# Patient Record
Sex: Female | Born: 1960
Health system: Southern US, Community
[De-identification: ages and names within clinical notes are randomized; demographics above are authoritative.]

## PROBLEM LIST (undated history)

## (undated) DIAGNOSIS — S6720XA Crushing injury of unspecified hand, initial encounter: Secondary | ICD-10-CM

## (undated) DIAGNOSIS — T8859XA Other complications of anesthesia, initial encounter: Secondary | ICD-10-CM

## (undated) DIAGNOSIS — F32A Depression, unspecified: Secondary | ICD-10-CM

## (undated) DIAGNOSIS — K5190 Ulcerative colitis, unspecified, without complications: Secondary | ICD-10-CM

## (undated) DIAGNOSIS — Z22322 Carrier or suspected carrier of Methicillin resistant Staphylococcus aureus: Secondary | ICD-10-CM

## (undated) DIAGNOSIS — K219 Gastro-esophageal reflux disease without esophagitis: Secondary | ICD-10-CM

## (undated) DIAGNOSIS — F1910 Other psychoactive substance abuse, uncomplicated: Secondary | ICD-10-CM

## (undated) DIAGNOSIS — K602 Anal fissure, unspecified: Secondary | ICD-10-CM

## (undated) DIAGNOSIS — M1990 Unspecified osteoarthritis, unspecified site: Secondary | ICD-10-CM

## (undated) DIAGNOSIS — I700 Atherosclerosis of aorta: Secondary | ICD-10-CM

## (undated) DIAGNOSIS — G8921 Chronic pain due to trauma: Secondary | ICD-10-CM

## (undated) DIAGNOSIS — I10 Essential (primary) hypertension: Secondary | ICD-10-CM

## (undated) DIAGNOSIS — K649 Unspecified hemorrhoids: Secondary | ICD-10-CM

## (undated) DIAGNOSIS — G629 Polyneuropathy, unspecified: Secondary | ICD-10-CM

## (undated) DIAGNOSIS — R29898 Other symptoms and signs involving the musculoskeletal system: Secondary | ICD-10-CM

## (undated) DIAGNOSIS — C4490 Unspecified malignant neoplasm of skin, unspecified: Secondary | ICD-10-CM

## (undated) DIAGNOSIS — G5620 Lesion of ulnar nerve, unspecified upper limb: Secondary | ICD-10-CM

## (undated) DIAGNOSIS — F4310 Post-traumatic stress disorder, unspecified: Secondary | ICD-10-CM

## (undated) DIAGNOSIS — G709 Myoneural disorder, unspecified: Secondary | ICD-10-CM

## (undated) DIAGNOSIS — Z87442 Personal history of urinary calculi: Secondary | ICD-10-CM

## (undated) DIAGNOSIS — W57XXXA Bitten or stung by nonvenomous insect and other nonvenomous arthropods, initial encounter: Secondary | ICD-10-CM

## (undated) DIAGNOSIS — F1010 Alcohol abuse, uncomplicated: Secondary | ICD-10-CM

## (undated) DIAGNOSIS — Z5189 Encounter for other specified aftercare: Secondary | ICD-10-CM

## (undated) DIAGNOSIS — T4145XA Adverse effect of unspecified anesthetic, initial encounter: Secondary | ICD-10-CM

## (undated) DIAGNOSIS — E785 Hyperlipidemia, unspecified: Secondary | ICD-10-CM

## (undated) DIAGNOSIS — F419 Anxiety disorder, unspecified: Secondary | ICD-10-CM

## (undated) DIAGNOSIS — I639 Cerebral infarction, unspecified: Secondary | ICD-10-CM

## (undated) DIAGNOSIS — F329 Major depressive disorder, single episode, unspecified: Secondary | ICD-10-CM

## (undated) DIAGNOSIS — J449 Chronic obstructive pulmonary disease, unspecified: Secondary | ICD-10-CM

## (undated) HISTORY — PX: COLON SURGERY: SHX602

## (undated) HISTORY — PX: COSMETIC SURGERY: SHX468

## (undated) HISTORY — DX: Post-traumatic stress disorder, unspecified: F43.10

## (undated) HISTORY — DX: Hyperlipidemia, unspecified: E78.5

## (undated) HISTORY — PX: TUBAL LIGATION: SHX77

## (undated) HISTORY — DX: Anxiety disorder, unspecified: F41.9

## (undated) HISTORY — DX: Polyneuropathy, unspecified: G62.9

## (undated) HISTORY — DX: Bitten or stung by nonvenomous insect and other nonvenomous arthropods, initial encounter: W57.XXXA

## (undated) HISTORY — DX: Anal fissure, unspecified: K60.2

## (undated) HISTORY — DX: Encounter for other specified aftercare: Z51.89

## (undated) HISTORY — DX: Myoneural disorder, unspecified: G70.9

## (undated) HISTORY — DX: Carrier or suspected carrier of methicillin resistant Staphylococcus aureus: Z22.322

## (undated) HISTORY — DX: Unspecified hemorrhoids: K64.9

## (undated) HISTORY — DX: Major depressive disorder, single episode, unspecified: F32.9

## (undated) HISTORY — DX: Depression, unspecified: F32.A

## (undated) HISTORY — PX: INCISION / DRAINAGE HAND / FINGER: SUR695

## (undated) HISTORY — DX: Unspecified osteoarthritis, unspecified site: M19.90

## (undated) HISTORY — DX: Other psychoactive substance abuse, uncomplicated: F19.10

## (undated) HISTORY — DX: Atherosclerosis of aorta: I70.0

---

## 2004-02-27 ENCOUNTER — Ambulatory Visit (HOSPITAL_COMMUNITY): Admission: RE | Admit: 2004-02-27 | Discharge: 2004-02-27 | Payer: Self-pay | Admitting: Internal Medicine

## 2004-06-11 ENCOUNTER — Ambulatory Visit: Payer: Self-pay | Admitting: Family Medicine

## 2005-08-30 ENCOUNTER — Inpatient Hospital Stay (HOSPITAL_COMMUNITY): Admission: EM | Admit: 2005-08-30 | Discharge: 2005-09-07 | Payer: Self-pay | Admitting: Emergency Medicine

## 2005-08-31 HISTORY — PX: ORIF CARPAL BONE FRACTURE: SUR922

## 2005-09-04 HISTORY — PX: ORIF RADIAL FRACTURE: SHX5113

## 2005-09-12 HISTORY — PX: OTHER SURGICAL HISTORY: SHX169

## 2005-12-24 ENCOUNTER — Inpatient Hospital Stay (HOSPITAL_COMMUNITY): Admission: AD | Admit: 2005-12-24 | Discharge: 2005-12-27 | Payer: Self-pay | Admitting: Orthopedic Surgery

## 2005-12-24 HISTORY — PX: HAND CARPECTOMY: SHX972

## 2006-05-11 ENCOUNTER — Inpatient Hospital Stay (HOSPITAL_COMMUNITY): Admission: RE | Admit: 2006-05-11 | Discharge: 2006-05-16 | Payer: Self-pay | Admitting: Orthopedic Surgery

## 2006-05-11 HISTORY — PX: OTHER SURGICAL HISTORY: SHX169

## 2008-01-19 ENCOUNTER — Emergency Department (HOSPITAL_COMMUNITY): Admission: EM | Admit: 2008-01-19 | Discharge: 2008-01-19 | Payer: Self-pay | Admitting: Emergency Medicine

## 2008-10-13 ENCOUNTER — Encounter: Admission: RE | Admit: 2008-10-13 | Discharge: 2008-10-13 | Payer: Self-pay | Admitting: Family Medicine

## 2009-01-13 ENCOUNTER — Emergency Department (HOSPITAL_COMMUNITY): Admission: EM | Admit: 2009-01-13 | Discharge: 2009-01-13 | Payer: Self-pay | Admitting: Emergency Medicine

## 2009-01-13 ENCOUNTER — Ambulatory Visit: Payer: Self-pay | Admitting: Psychiatry

## 2009-01-13 ENCOUNTER — Observation Stay (HOSPITAL_COMMUNITY): Admission: AD | Admit: 2009-01-13 | Discharge: 2009-01-14 | Payer: Self-pay | Admitting: Psychiatry

## 2009-10-21 ENCOUNTER — Ambulatory Visit (HOSPITAL_COMMUNITY): Admission: RE | Admit: 2009-10-21 | Discharge: 2009-10-21 | Payer: Self-pay | Admitting: Family Medicine

## 2010-12-21 LAB — RAPID URINE DRUG SCREEN, HOSP PERFORMED
Amphetamines: NOT DETECTED
Barbiturates: NOT DETECTED
Benzodiazepines: NOT DETECTED
Cocaine: NOT DETECTED
Opiates: NOT DETECTED
Tetrahydrocannabinol: POSITIVE — AB

## 2010-12-21 LAB — COMPREHENSIVE METABOLIC PANEL
ALT: 26 U/L (ref 0–35)
AST: 31 U/L (ref 0–37)
Albumin: 4.1 g/dL (ref 3.5–5.2)
Alkaline Phosphatase: 46 U/L (ref 39–117)
BUN: 9 mg/dL (ref 6–23)
CO2: 27 mEq/L (ref 19–32)
Calcium: 9.5 mg/dL (ref 8.4–10.5)
Chloride: 109 mEq/L (ref 96–112)
Creatinine, Ser: 0.72 mg/dL (ref 0.4–1.2)
GFR calc Af Amer: >60 mL/min (ref >60)
GFR calc non Af Amer: >60 mL/min (ref >60)
Glucose, Bld: 82 mg/dL (ref 70–99)
Potassium: 3.9 mEq/L (ref 3.5–5.1)
Sodium: 141 mEq/L (ref 135–145)
Total Bilirubin: 0.5 mg/dL (ref 0.3–1.2)
Total Protein: 6.4 g/dL (ref 6.0–8.3)

## 2010-12-21 LAB — DIFFERENTIAL
Basophils Absolute: 0 10*3/uL (ref 0.0–0.1)
Basophils Relative: 0 % (ref 0–1)
Eosinophils Absolute: 0.1 10*3/uL (ref 0.0–0.7)
Eosinophils Relative: 1 % (ref 0–5)
Lymphocytes Relative: 30 % (ref 12–46)
Lymphs Abs: 2.3 10*3/uL (ref 0.7–4.0)
Monocytes Absolute: 0.5 10*3/uL (ref 0.1–1.0)
Monocytes Relative: 7 % (ref 3–12)
Neutro Abs: 4.9 10*3/uL (ref 1.7–7.7)
Neutrophils Relative %: 62 % (ref 43–77)

## 2010-12-21 LAB — CBC
HCT: 41 % (ref 36.0–46.0)
Hemoglobin: 13.8 g/dL (ref 12.0–15.0)
MCHC: 33.7 g/dL (ref 30.0–36.0)
MCV: 95.8 fL (ref 78.0–100.0)
Platelets: 250 10*3/uL (ref 150–400)
RBC: 4.28 MIL/uL (ref 3.87–5.11)
RDW: 12.7 % (ref 11.5–15.5)
WBC: 7.8 10*3/uL (ref 4.0–10.5)

## 2010-12-21 LAB — POCT PREGNANCY, URINE: Preg Test, Ur: NEGATIVE

## 2010-12-21 LAB — ETHANOL: Alcohol, Ethyl (B): 65 mg/dL — ABNORMAL HIGH (ref 0–10)

## 2011-01-28 NOTE — Op Note (Signed)
[name], [name]             ACCOUNT NO.:  [id number]   MEDICAL RECORD NO.:  [id number]          PATIENT TYPE:  INP   LOCATION:  5018                         FACILITY:  [facility]   PHYSICIAN:  Lind Guest. Ninfa Linden, M.D.DATE OF BIRTH:  1961/06/08   DATE OF PROCEDURE:  09/04/2005  DATE OF DISCHARGE:                                 OPERATIVE REPORT   PREOPERATIVE DIAGNOSES:  1.  Right hand and wrist crush injury status post second ray amputation,      status post pinning of unstable thumb metacarpal, status post pinning of      unstable wrist carpus. risk carpus.  2.  Pinning of unstable distal radius fracture, status post serial      debridements x2.   POSTOPERATIVE DIAGNOSES:  1.  Right hand and wrist crush injury status post second ray amputation,      status post pinning of unstable thumb metacarpal, status post pinning of      unstable wrist carpus. risk carpus.  2.  Pinning of unstable distal radius fracture, status post serial      debridements x2.   PROCEDURE:  1.  Irrigation and debridement of right hand and wrist crush wounds.  2.  Thumb tip amputation through the distal interphalangeal joint and      partial proximal phalanx.  3.  Stabilization of volar lip distal radius fracture using a volar locking      plate.   SURGEON:  Lind Guest. Ninfa Linden, M.D.   ANESTHESIA:  General.   TOURNIQUET TIME:  40 minutes.   COMPLICATIONS:  None.   BLOOD LOSS:  Minimal.   INDICATIONS:  Briefly, [name] is a 50 year old individual who 3-4 days  ago sustained a crushing injury to her right dominant hand when it was  caught in a molding machine.  She sustained a complete circumferentially  degloving injury of her index finger with complete loss of the neurovascular  bundles throughout the finger.  She also had a severe dislocation of her  thumb with a crushing injury of the thumb metacarpal, as well as the basilar  thumb joint with partial loss of the trapezium and  complete loss of the  trapezoid.  There was significant soft tissue disruption of the  neurovascular bundles of the thumb as well. There was segmental loss of the  distal radial artery. There was an open fracture of the distal radius with  loss of the radial styloid and dislocation of the carpus.  These were all  stabilized on the night of surgery. The patient also underwent second ray  amputation and an attempt to salvage the thumb, although no vascular repair  was done to the thumb.  She then returned the OR within 48 hours for repeat  irrigation and debridement and now this is 48 hours since the last  debridement for a third look at the thumb and hand.   DESCRIPTION OF PROCEDURE:  After informed consent was obtained, [name]  was brought to the operating room and general anesthesia was obtained.  Attention was turned to the thumb and the thumb tip from the IP joint  on was  necrotic and this were removed sharply with a knife.  The bone was then  debrided from the proximal phalanx back to allow for soft tissue coverage.  Next, sutures were removed from within the palm to allow for debridement of  necrotic tissue in this area.  There was abundant whiteness noted in the  tissue that was non-blanching, however it did not appear necrotic at this  portion, so I did no further debridement in the hand.  I did assess the  wrist under fluoroscopy and the volar lip fracture of the distal radius that  was previously pinned was becoming displaced, so with the fracture exposed  through her volar wound, I decided to stabilize this with a buttressing type  of plate.  I selected the smallest [employer] plate that there is for  a distal radius volar locking plate and fashioned this along the volar  surface and secured it proximally and distally.  Under direct fluoroscopic  guidance, I could see that none of the screws penetrated the joint.  I could  also visualize the radial styloid screw and it  did have some contact with  bone but again the radial styloid itself was completely missing.  Once the  plate was secured, the pins were removed from the distal radius and the  fracture was found to be secure.  The remaining pins that were holding the  thumb metacarpal in place, as well as the carpus, were all cut to lie below  the soft tissue.  The tourniquet was let down at 40 minutes and I copiously  irrigated the wound with 500 cc of bacitracin solution, followed by 1 liter  of normal saline solution, all using bulb irrigation.  The wounds were then  closed over the palm with interrupted 3-0 Vicryl suture.  I had to use  interrupted 2-0 Vicryl suture at the wrist did appear to be somewhat of a  tight closure which was concerning.  I then placed Adaptic over the wounds,  followed by a well-padded sterile dressing and a plaster splint.  She was  awakened, extubated and taken to the recovery room in stable condition.   Postoperatively again I will follow this closely to assure that no further  irrigation or debridement is warranted.  She will need extensive  reconstructive surgery on her hand, still being that all the tendons in the  first dorsal compartment were likewise destroyed.  I will seek consultation  from the upper extremity services at [facility] to see what  further advice they can give me and see if I can get follow-up established  with either of these institutions as well.           ______________________________  Lind Guest. Ninfa Linden, M.D.     CYB/MEDQ  D:  09/04/2005  T:  09/06/2005  Job:  [id number]

## 2011-01-28 NOTE — Discharge Summary (Signed)
[name], [name]             ACCOUNT NO.:  [id number]   MEDICAL RECORD NO.:  [id number]          PATIENT TYPE:  IPS   LOCATION:  0506                          FACILITY:  [facility]   PHYSICIAN:  Carlton Adam, M.D.      DATE OF BIRTH:  Jun 30, 1961   DATE OF ADMISSION:  01/13/2009  DATE OF DISCHARGE:  01/14/2009                               DISCHARGE SUMMARY   CHIEF COMPLAINT:  This was the first admission to [facility] for this 49 year old female who came voluntarily requesting help  with alcohol and drug use.  She was actively denying any suicidal or  homicidal ideations.  Endorsed has had 19 surgeries in 3 years, lost 2  fingers, has residual pain and that has set the stage for her to  persistently abuse alcohol.  Uses oxycodone for pain, but endorsed that  the opiates make her angrier to the point of violence.  Admits she has  been left with some symptoms suggestive of PTSD.  She was trapped in a  machine with subsequent 19 surgeries with persistent chronic pain.  Upon  admission, she got upset.  She claimed that she was told that she could  not wear shorts, and that seemed to have set the stage for her to become  really very angry, and to feel that we could not help her.   PAST PSYCHIATRIC HISTORY:  Had been at [facility], [place], [place],  [place], as well as a private psychiatric hospital.   ALCOHOL AND DRUG HISTORY:  Does admit to persistent use of alcohol,  escalating amounts, cannot tell how much as well as smokes marijuana on  a regular basis.  Cannot say how much.   PHYSICAL EXAM:  She was examined at [facility].   LABORATORY WORK:  No results available in the chart.   MENTAL STATUS EXAMINATION:  Upon this assessment revealed female who is  alert, very agitated, loud, vile affect, becomes angrier as she feels  that the hospital wanted to make her do things like wear particular  clothes.  Did admit that when she gets this angry, there is no  reasoning  with her, so she tries very hard to prevent to get to that point.  Her  son had come from [place] and she was wanting to get detox so she can go  with him, but continued to say that she was not going to get anything  from the hospital.  She denied any active suicidal ideations.  There  were no delusions.  No hallucinations.  Did admit that she did have a  problem with alcohol and was willing to get the medication to detox at  home.  Her son was going to be with her.  Upon discharge in full contact  with reality.  Still upset with the experience that she had the night  she was admitted to [facility] and unwilling or unable to get  past that experience.   DISCHARGE DIET:  AXIS I:  Alcohol dependence, marijuana abuse.  Mood  disorder not otherwise specified, post-traumatic stress disorder.  AXIS II:  No  diagnosis.  AXIS III:  Status post accident with amputation of fingers, multiple  surgeries, chronic pain.  AXIS IV:  Moderate.  AXIS V:  On discharge 35 to 28.   DISCHARGE INSTRUCTIONS:  Librium 25 four times a day for 1 day, then  Librium 25 one 3 times a day for 1 day, Librium 25 one twice a day for a  day, and then Librium 25 one daily for 1 day, then discontinue.  An  appointment was made to see Tobey Grim at [facility].      Carlton Adam, M.D.  Electronically Signed     IL/MEDQ  D:  01/26/2009  T:  01/26/2009  Job:  [id number]

## 2011-01-28 NOTE — Op Note (Signed)
NAMEVINCY, FELIZ             ACCOUNT NO.:  [id number]   MEDICAL RECORD NO.:  [id number]          PATIENT TYPE:  INP   LOCATION:  5018                         FACILITY:  [facility]   PHYSICIAN:  Lind Guest. Ninfa Linden, M.D.DATE OF BIRTH:  [birth year]   DATE OF PROCEDURE:  09/02/2005  DATE OF DISCHARGE:                                 OPERATIVE REPORT   PREOPERATIVE DIAGNOSIS:  Right hand crush injury, status post second ray  amputation with stabilization of crushed dislocated thumb, stabilization of  the dislocated carpus, and stabilization of distal radius fracture.   PROCEDURE:  Repeat irrigation and debridement of right hand wounds.   SURGEON:  Lind Guest. Ninfa Linden, M.D.   ANESTHESIA:  General.   BLOOD LOSS:  Minimal.   ANTIBIOTICS:  600 mg IV Ancef.   INDICATIONS:  Briefly, [name] is a 50 year old, who 48 hours ago had  her right hand caught in a moulding machine at her place of employment.  She  sustained significant trauma to her right dominant hand and distal radius  including degloved and completely crushed index finger, a completely  dislocated and crushed thumb, including a powdered thumb metacarpal with a  completely destroyed thumb; basilar joint. She also had a scapholunate  disassociation and an open dislocation of the carpus as well as the loss of  her radial styloid and the distal radial fracture. These were all  stabilized, on the night that she came in, and a second ray amputation and  to be performed. The thumb was stabilized and we were bringing her back at  48 hours to further assess the hand.  The thumb, is insensate prior to  bringing in back to the room and has been dusky at its tip, and has signs of  being dysvascular. The risks and benefits of this were explained to her and  the need to return to the operating room at this time, and she agreed to  procedure with surgery.   PROCEDURE DESCRIPTION:  After informed consent was obtained, the right  arm  was marked and [name] was bought to the operating room, and placed  supine on the operating table. General anesthesia was then induced and her  right arm was placed on a radiolucent arm table. The arm and hand were  prepped with Hibiclens. Sterile drapes were applied as well. The hand was  assessed and found to have abundant edema. There was no gross purulence. The  remaining middle ring and little fingers were all well-perfused.  The thumb  showed signs of beginning to demarcate especially at the distal tip which  had a look of appearing to become mummified.   Next I opened up the palmar wound, and provided a thorough irrigation and  debridement of dead skin as well as irrigating the wound was 500 mL of  bacitracin solution using a hand bulb, then followed by 1 liter of normal  saline solution. The wounds, themselves, appeared to be clean; but, again,  the thumb is suspect for having had to be removed at least at its tip. She  will still need further reconstructive surgery down  the road.   At the and the case, the wounds were then closed; again, Xeroform was placed  over all wounds followed by a well-padded volar and dorsal plaster splint to  allow for stabilization of the wrist.  Further she was awakened, extubated  and taken to recovery room in stable condition. The tourniquet was not  utilized during the case. Also will bring her back to the OR in the next 48  hours for serial irrigation, debridement, and potential amputation of the  thumb tip.           ______________________________  Lind Guest. Ninfa Linden, M.D.     CYB/MEDQ  D:  09/02/2005  T:  09/04/2005  Job:  [id number]

## 2011-01-28 NOTE — H&P (Signed)
[name], [name]             ACCOUNT NO.:  [id number]   MEDICAL RECORD NO.:  [id number]          PATIENT TYPE:  INP   LOCATION:  2550                         FACILITY:  [facility]   PHYSICIAN:  Lind Guest. Ninfa Linden, M.D.DATE OF BIRTH:  1960/12/23   DATE OF ADMISSION:  08/30/2005  DATE OF DISCHARGE:                                HISTORY & PHYSICAL   REASON FOR ADMISSION:  Right hand crush injury.   HISTORY OF PRESENT ILLNESS:  Briefly, [name] is a 50 year old right  hand dominant female who was working this evening at her place of employment  when she got her right hand caught in a molding machine.  It pulled her  hand into the machine and she had a significant degloving injury to this  hand including significant crush injuries to the thumb, index finger, and  wrist.  She was brought by a friend to the emergency room and orthopedic  surgery hand was consulted immediately.  On examination she denied any  sensation in her index and thumb and was in quite significant pain and it  was difficult to get a good exam from her due to her pain.  There was  significant noted degloving of the hand in general.  She otherwise reported  that she had no other injuries from accidentally putting her hand into the  molding machine.   PAST MEDICAL HISTORY:  Questionable CVA in 2000, where she reported vision  changes but no residual functional deficits.   ALLERGIES:  CODEINE.   MEDICATIONS:  None.   SOCIAL HISTORY:  She lives with her husband in [place] and has several  children.  She denies smoking or alcohol use.   REVIEW OF SYSTEMS:  Negative for chest pain, shortness of breath, nausea,  vomiting, fever, chills.   PHYSICAL EXAMINATION:  VITAL SIGNS:  She is afebrile, stable vital signs.  GENERAL:  This is an alert and oriented female in no acute distress but  extreme discomfort.  She follows commands appropriately.  HEENT:  Normocephalic, atraumatic.  Pupils are equal, round,  and reactive to  light.  Extraocular muscles intact.  NECK:  Supple.  No JVD.  No bruits.  LUNGS:  Clear to auscultation bilaterally.  HEART:  Regular rate and rhythm.  ABDOMEN:  Soft, nontender, nondistended.  Normoactive bowel sounds.  EXTREMITIES:  Examination of the right hand shows significant soft tissue  deficit on both the dorsum of the hand and the palmar aspect of the hand.  There is degloving of the entire index finger with no sensation in that  finger, exposed tendons, and the neurovascular bundle is completely gone.  The thumb is being held by dorsal skin bridge but it appears to be  completely dislocated with its skin degloved as well.  She has no sensation  in her thumb and index finger whatsoever.  Her wrist is acutely dislocated  and there is an open radial styloid fracture with exposed scaphoid bone that  is dislocated.  She has a wound that extends into her wrist as well and the  radial artery is visible and has clot formation.  There  is again significant  soft tissue stripping.  She has normal function in her middle, ring, and  little fingers and normal perfusion of these as well as normal sensation.  There is definitely no perfusion to the index finger and questionable  perfusion to the thumb.   X-rays revealed and showed significant crush injury to the index finger.  These involve proximal, middle, and distal phalanxes.  There is also a  metacarpal base fracture.  Her fifth metacarpal is fractured at its base as  well but is not unstable.  There is significant crush injury with bone loss  to the thumb metacarpal with a thumb metacarpal CMC joint dislocation.  The  trapezoid looked to be involved and fractured.  The scaphoid, there is a  radial styloid fracture that shows loss of bone.  There is also a volar  distal radius fracture which is a volar Bartons fracture and a scapholunate  dissociation.   ASSESSMENT:  This is a 50 year old right hand dominant female  with an acute  crush injury to her right hand.   PLAN:  I have talked to her husband who is with her at length as well as Ms.  [name] and have determined that she will need at least a second ray  amputation due to the extreme injury to the index finger with lack of  neurovascular bundles visible in the wound and the lack of perfusion to that  finger.  I am concerned quite extensively about the thumb and its potential  lack of perfusion as well as the crush nature of it and there is a chance  she could loose this thumb.  She will also have to have a wrist stabilized  and the wounds thoroughly cleaned.  The risks and benefits of this were  explained to her and her family and was well understood.  She was started on  IV antibiotics and will be taken to the operating room emergently.           ______________________________  Lind Guest. Ninfa Linden, M.D.     CYB/MEDQ  D:  08/31/2005  T:  08/31/2005  Job:  [id number]

## 2011-01-28 NOTE — Discharge Summary (Signed)
[name], [name]             ACCOUNT NO.:  [id number]   MEDICAL RECORD NO.:  [id number]          PATIENT TYPE:  INP   LOCATION:  5003                         FACILITY:  [facility]   PHYSICIAN:  Satira Anis. Gramig III, M.D.DATE OF BIRTH:  October 02, 1960   DATE OF ADMISSION:  05/11/2006  DATE OF DISCHARGE:  05/16/2006                                 DISCHARGE SUMMARY   DICTATION:  [name] is a 50 year old female who has undergone  significant reconstructive efforts in the past about her right wrist  secondary to a on-the-job industrial injury.  The patient has been seen in  the past by Dr. Ninfa Linden for initial I and D and reconstructive efforts  followed by a trip to [facility] where she underwent  attempted flap which failed followed by a growing flap to the hand.  The  patient presented for my evaluation many months ago and was found to have a  necrotic osteomyelitic focus about her scaphoid bone, instability about her  wrist, and a chronic draining sinus.  She was taken to the operative suite,  underwent I and D, and hospitalization.  She has now cleared her infection  and has an unstable wrist secondary to her amputation about the thumb and  index finger which was performed at [facility] after salvage efforts  for her hand necessitated.  The patient presents for this hospitalization  for total wrist fusion and defatting of the ulnar aspect of her flap, as  well as tenolysis as necessary.   [name] was consented for surgery and understood the procedure to be  fusion of her wrist as well as the defatting technique and tenolysis as  described.   HOSPITAL COURSE:  The patient was taken to the operative suite May 11, 2006 and underwent right total wrist fusion with a iliac crest bone graft  obtained from the left hip.  She tolerated this beautifully.  The operation  went very smoothly without complicating feature.  Post op day #1, she was  awake,  alert, and oriented doing quite well.  She was tolerating a diet and  ambulatory.  The patient was seen by occupational therapy and was stable.  Elevation and other instructions were taught to the patient.  The patient's  hospital course was fairly uneventful.  She did require IV pain medicine,  post op antibiotics, and general observation.  Given her history of an MRSA  infection in the past, I did place her on Vancomycin for coverage.  Her  wound looked excellent throughout her course, she remained vascularly intact  and had no complicating features with her total wrist fusion, flap, or other  measures.  As of May 16, 2006 the patient was doing quite well.  She  was afebrile, vital signs were stable.  She had clear chest examination with  no evidence of wheeze or consolidating type picture.  Heart was regular  rate, abdomen was non tender, non distended, and soft, and her right upper  extremity looked excellent as did her left hip where the bone graft was  taken from.  At this  time, she was stable for discharge and was dc 'd home  on a regular diet.  She will continue elevation, proper wound care as  discussed with her, keeping the areas clean and dry that were incised.  We  released her on OxyContin 10 mg 1 p.o. b.i.d., Percocet for breakthrough  pain 1-2 q.4-6 hours p.r.n. pain p.o. dispensed #60, Lexapro 10 mg 1 p.o. q.  a.m., Trazodone 50 mg 1 p.o. q.h.s., Lyrica 150 mg 1 p.o. b.i.d., Peri-  Colace 100 mg 1 p.o. b.i.d.  Will return to the office to see [place] in 1 week.   Patient tolerated her hospital stay well and we look forward to  participating in her postoperative care.   FINAL DIAGNOSIS:  Status post surgical intervention in the form of right  total wrist fusion with left iliac crest bone graft for an unstable wrist  status post industrial injury with first and second right amputations.           ______________________________  Satira Anis. Blanchie Dessert, M.D.     Sampson Si   D:  06/13/2006  T:  06/14/2006  Job:  [id number]

## 2011-01-28 NOTE — Discharge Summary (Signed)
[name], [name]             ACCOUNT NO.:  [id number]   MEDICAL RECORD NO.:  [id number]          PATIENT TYPE:  INP   LOCATION:  5004                         FACILITY:  [facility]   PHYSICIAN:  Satira Anis. Gramig III, M.D.DATE OF BIRTH:  02-Jul-1961   DATE OF ADMISSION:  12/24/2005  DATE OF DISCHARGE:  12/27/2005                                 DISCHARGE SUMMARY   DISCHARGE SUMMARY:  [name] is a 50 year old female who was  admitted for I&D and removal of a scaphoid bone secondary to osteomyelitis.  This patient has undergone a very lengthy reconstructive process, including  initial I&D by Dr. Ninfa Linden followed by a transfer to [facility], where  she underwent a failed anterolateral thigh flap and subsequently was  salvaged with a growing flap.  She has had persistent sinus tract drainage  about her right hand.  She is status post a thumb and index ray amputations.  The patient has a noted sinus tract drainage, exposed hardware, and a  scaphoid bone, which appears to be avascular in my opinion.  I have  counseled her in regards to the surgical intervention and options.  She  desires to proceed with the above-mentioned operative procedure, as  described to her.  She was taken to the operative suite December 24, 2005, and  underwent I&D, hardware removal, corpectomy of the scaphoid bone, and stress  x-ray.  Subsequent to this, cultures did grow out methicillin-resistant  Staph aureus.  This was sensitive to tetracycline and Bactrim.  The patient  was monitored postoperatively quite closely and did fairly well.  She was  placed on vancomycin and gentamicin until her cultures were complete.  She  was advanced to a regular diet, and had no complicated features  postoperatively.  The patient was stable December 27, 2005 and was discharged home on:  1.  Doxycycline 100 mg p.o. b.i.d. x6 weeks.  2.  Oxycodone 5 mg, 1 to 2 q.4-6 hours p.r.n. pain.  3.  __________  1 p.o. b.i.d.  4.   Lexapro 10 mg, 1 p.o. daily.  5.  Colace 1 p.o. b.i.d.  6.  Trazodone 50 mg q.h.s.  7.  Oxycodone ER 20 mg, 1 p.o. b.i.d.   The patient was instructed on wound care and other measures.  At the time of  discharge, she was afebrile, alert, and oriented, vital signs were stable,  and she had a clear chest on exam.  Abdomen was nontender, nondistended.  Heart rate was normal, and she had no signs of postop complications.  She is  going to return to the office to see me in 5 to 10 days.  Continue  elevation, finger range of motion, and notify me should any problems,  questions, or concerns arise.   FINAL DIAGNOSES:  Osteomyelitis, right scaphoid bone, status post traumatic  hand injury with ray amputation of the thumb and index finger ray, requiring  flap, with persistent sinus drainage, which required I&D and removal of the  scaphoid bone and hardware.           ______________________________  Satira Anis. Blanchie Dessert, M.D.  WMG/MEDQ  D:  04/09/2006  T:  04/09/2006  Job:  [id number]

## 2011-01-28 NOTE — Discharge Summary (Signed)
NAMESHAWNIA, VIZCARRONDO             ACCOUNT NO.:  [id number]   MEDICAL RECORD NO.:  [id number]          PATIENT TYPE:  INP   LOCATION:  5018                         FACILITY:  [facility]   PHYSICIAN:  Lind Guest. Ninfa Linden, M.D.DATE OF BIRTH:  [birth year]   DATE OF ADMISSION:  08/30/2005  DATE OF DISCHARGE:  09/07/2005                                 DISCHARGE SUMMARY   ADMITTING DIAGNOSIS:  Right hand with severe crush injury.   DISCHARGE DIAGNOSIS:  Right hand with severe crush injury.   PROCEDURE:  Multiple irrigations and debridements as well as stabilization  of right hand crush injuries on multiple dates including the day of  admission.   HOSPITAL COURSE:  Briefly, [name] is a 50 year old right hand dominant  female who had her right hand caught in some type of a molding machine.  She was seen emergently at the [facility] ER and found to have  extensive degloving of her right hand including the index finger and the  thumb.  She also had open, exposed, and dislocated carpal bones and a broken  degloved wrist as well. She had extensive damage and was taken emergently to  the operating room where she underwent stabilization of the wounds including  a primary second ray amputation of the fingers secondary to the severe  degloved-ischemic nature of the finger that was deemed unreconstructable.  Attempts were made to reconstruct the thumb and she was brought back to the  operating room on three separate occasions for serial irrigation, and  debridement as well as final stabilization of the wrist and carpus.   By the day of discharge she was tolerating her regular diet as well as oral  pain medications and was on a regular splint.  Due to the severe nature of  her injury, I was able to get in contact with Dr. Lindi Adie of the Pl[facility] at [facility].  He agreed to see Ms [name] in followup for the possible need  for further  reconstructive efforts and the possibility of a muscle flap and further  extensive reconstruction of this.  An appointment was set up for the few  days following discharge.   DISPOSITION:  To home.   DISCHARGE MEDICATIONS:  She was discharged on oral pain medications and oral  antibiotics.   DISCHARGE INSTRUCTIONS:  Appointment has been established with Dr. Flonnie Overman  office on this coming Friday, at [facility], for further  evaluation of her hand for potential flap coverage.  She will also obtain  followup in my office within the next week as well.           ______________________________  Lind Guest. Ninfa Linden, M.D.     CYB/MEDQ  D:  10/19/2005  T:  10/19/2005  Job:  [id number]

## 2011-01-28 NOTE — Op Note (Signed)
NAMEMAKAYELA, [name]             ACCOUNT NO.:  [id number]   MEDICAL RECORD NO.:  [id number]          PATIENT TYPE:  INP   LOCATION:  5003                         FACILITY:  [facility]   PHYSICIAN:  Satira Anis. Gramig III, M.D.DATE OF BIRTH:  February 26, 1961   DATE OF PROCEDURE:  05/11/2006  DATE OF DISCHARGE:                                 OPERATIVE REPORT   SURGEON:  Satira Anis. Amedeo Plenty, M.D.   ASSISTANT:  Avelina Laine, P.A.-C.   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  This patient is  status post a groin flap, and has significant instability about the wrist,  chronic pain.   POSTOPERATIVE DIAGNOSES:  Right wrist instability, status post traumatic  injury with first and second ray amputations, including scaphoid, trapezoid  and trapezium removal secondary to traumatic injury with subsequent  osteomyelitis and avascular necrosis about the scaphoid.  This patient is  status post a groin flap, and has significant instability about the wrist,  chronic pain.   PROCEDURES:  1. Right total wrist fusion with iliac crest bone graft and a Synthes AO      wrist fusion plate.  2. Stress radiography.  3. Defatting ulnar aspect, groin flap, right wrist and hand.  4. Extensor digitorum communis tenolysis, extensive, about the fourth      dorsal compartment.   COMPLICATIONS:  None.   ANESTHESIA:  General.   TOURNIQUET TIME:  Less than 90 minutes.   INDICATIONS FOR PROCEDURE:  [name] is a very unfortunate 50 year old  female, who sustained a traumatic injury to her hand.  She ended up losing  the thumb and index finger, as well as the scaphoid, trapezoid and  trapezium, and underwent a groin flap at [facility]. Previously, she had  been seen at [facility] and underwent ORIF of her wrist and attempted  salvage of her carpal bones.  I was asked to see and take over her care, at  which time I performed evaluation and subsequent I&D, including removal of  an avascular necrotic scaphoid bone, which was a focus of sinus tract  drainage and infection.  She underwent aggressive wound care and  subsequently had resolution of the draining sinus tract, and her lab values  returned to normal in terms of infection counts.  She has difficulty with  instability about the wrist and chronic ulnar deviation, and has difficulty  gripping.  We have counseled her regarding the risks and benefits of  surgery; and, thus, she desires to proceed with the above-mentioned  operative intervention, understanding and accepting the risks and benefits  of surgery.   OPERATIVE PROCEDURE:  The patient was identified by myself and Anesthesia.  She was taken to the operating suite, placed supine and thoroughly padded,  prepped and draped in the usual sterile fashion about the left hip and right  upper extremity.  The patient had perioperative Ancef given, and immediately  following tourniquet deflation, was started  on vancomycin.  She will be  continued on vancomycin, I should note.   Once this was accomplished, the patient had appropriate padding placed.  The  hand was secured.  I then began the operation with inflation of the  tourniquet, followed by an incision dorsally along the previous scar planes.  An excision of scar, approximately 3 to 4 mm in width, was removed, and  dissection was carried down to the retinaculum, which was gently swept  ulnarly and radially.  I then dissected down to the periosteal tissue,  carefully preserving the extensor apparatus.  Once this was done, I exposed  the distal radius, as well as the carpus and the third metacarpal.  Periosteal dissection was accomplished so as to afford plate placement.  I  then prepared all surfaces,  including triquetrum, lunate, capitate and  hamate, as well as the distal radius.  Once this was done, I irrigated  copiously, and with the cartilage denuded, I placed a plate for preliminary  measurements.  I felt that the AO wrist fusion plate sat nicely, and I  tested various positions, etc.  Once this was done, I then turned attention  towards the left hip.  An incision was made and dissection was carried  through the skin with a knife blade.  Following this, cautery dissection was  carried out through the superficial and deep fascia.  The iliac crest was  identified.  A cortical window was made and a large copious amount of  cancellous bone graft was then obtained.  Once it was obtained, I then  packed the area with Gelfoam and thrombin in the defect and replaced the  cortical window.  Following this, I then placed a 1/8-inch Hemovac drain and  closed the wound in layers of 0 Vicryl, 3-0 Vicryl and subcuticular Prolene  with Steri-Strips.  She tolerated this well.  Hemostasis was obtained  without difficulty.   The bone graft was then taken to the operative site and all areas were  packed tightly with bone graft, followed by placement of the AO fusion  plate.  The patient had three 2.7-mm screws placed distally and 3.5-mm  screws times four placed proximally about the radius.  I was very careful to  avoid the volar radius plate previously placed at the prior operative  setting.  Excellent fixation was obtained.  I was pleased with the alignment  and correction.  She had slight extension.  The ulnar deviated deformity was  corrected and all looked quite well.  I packed bone graft tightly into the  area and was quite pleased.  I then performed stress radiography, revealing  excellent position of the plate and screw construct.  Following this, I  closed the periosteal tissue with 3-0 Vicryl.  Once this was done, I performed a tenolysis of the fourth dorsal compartment through window   incisions to insure that we had full range-of-motion abilities.  This was  done to my satisfaction without difficulty, and scar tissue was freed up  nicely.  Following this, I performed a partial defatting technique of the  ulnar groin flap.  The patient tolerated this well also.  This was done  meticulously with the tourniquet down to make sure that there would not be  any viability issues of her prior groin flap.  Once this was done, I then  sutured the construct with 3-0 Vicryl in the subcu, followed by interrupted  Prolene in the skin edge.  A TLS drain, size 7, was  placed prior to closure  and hooked up to suction.  A short-arm splint was applied and the patient  tolerated the procedure well.  There were no complicating features.  All  sponge, needle and instrument counts were reported as correct.  She was  given vancomycin immediately following the procedure and was taken to the  recovery room.  We will plan for IV antibiotics, pain management according  to her needs, elevation, strict elevation, and I will ask therapy to see her  for weightbearing, as she will need help with weightbearing to tolerance  into the future.  I have discussed with her these issues at length and all  questions have been encouraged and answered.  I have also extensively  counseled the family in regard to her operative procedure and the plans.           ______________________________  Satira Anis. Blanchie Dessert, M.D.     Sampson Si  D:  05/11/2006  T:  05/11/2006  Job:  [id number]

## 2011-01-28 NOTE — Op Note (Signed)
[name], RICCIARDELLI             ACCOUNT NO.:  [id number]   MEDICAL RECORD NO.:  [id number]          PATIENT TYPE:  OIB   LOCATION:  5004                         FACILITY:  [facility]   PHYSICIAN:  Satira Anis. Gramig III, M.D.DATE OF BIRTH:  June 17, 1961   DATE OF PROCEDURE:  12/24/2005  DATE OF DISCHARGE:                                 OPERATIVE REPORT   PREOPERATIVE DIAGNOSIS:  Status post partial hand amputation with loss of  the thumb and index finger and noted avascular necrosis of the scaphoid as  well as osteomyelitic region about the scaphoid and chronic draining sinus.  This patient is status post attempted flap and subsequent growing flap  performed about the hand for coverage at an outside facility.   POSTOPERATIVE DIAGNOSIS:  Status post partial hand amputation with loss of  the thumb and index finger and noted avascular necrosis of the scaphoid as  well as osteomyelitic region about the scaphoid and chronic draining sinus.  This patient is status post attempted flap and subsequent growing flap  performed about the hand for coverage at an outside facility.   PROCEDURE:  1.  I&D skin, subcutaneous tissue, muscle and bone as well as an excisional      debridement about the hand and wrist with multiple cultures taken.  2.  Hardware removal right wrist in the form of two Kirschner wires.  3.  Carpectomy scaphoid bone right wrist secondary to osteomyelitis and      avascular necrosis.  4.  Stress radiography.   SURGEON:  Satira Anis. Amedeo Plenty, M.D.   ASSISTANT:  Avelina Laine, P.A.-C.   COMPLICATIONS:  None.   CULTURES:  Multiple, including bone and soft tissue cultures were taken.   ESTIMATED BLOOD LOSS:  Minimal.   INDICATIONS FOR THE PROCEDURE:  [name] is a 50 year old female who  presents with the above-mentioned diagnosis.  After counseling regarding the  risks and benefits of surgery, including risk of infection, bleeding,  anesthesia, damage to normal structures  and failure of surgery, __________.  At this time she has decided to proceed, all questions have been encouraged  and answered preoperatively.   The patient and I have discussed the pre and postop routine.  Unfortunately,  she has had a major injury to her hand, losing her index finger and thumb.  She had a failed flap at [facility].  She was salvaged with a growing  flap.  She has had a chronic draining sinus from the growing flap, avascular  scaphoid with evidence of osteomyelitis and retained hardware.  She presents  with the above-mentioned diagnosis.  Our plan is to try and afford her  quiescence in terms of her infection, stability in terms of her soft  tissues, and then to consider a salvage procedure for wrist stabilization in  the future.  She understands and accepts the risks and benefits of surgery.   OPERATION IN DETAIL:  The patient was seen by myself and anesthesia, taken  to the operating, underwent smooth induction of IV sedation.  Regional  anesthetic placed by Dr. Tamela Gammon, in the form of an infraclavicular block  in the [employer], was in excellent working fashion.  The patient was  prepped and draped with Betadine scrub and paint.  Once this was done, the  patient had the tourniquet insufflated to 250 mmHg.  A small extension of  the draining sinus 1 cm dorsal and volar was made.  Dissection was carried  down.  Soft tissue, which appeared to be necrotic, was removed.  I removed  necrotic skin, subcu, muscle and bone portions.  Following this, I then sent  this for culture, both fluid and tissue culture was sent.  The patient has  been off antibiotics for greater than a week, under my direction of course.  She had previous been on multiple rounds of antibiotics via physicians at  [facility].   Following obtaining cultures, I then performed hardware removal x2 deeply  situated K-wires.  This was done under fluoro localization, taking care to  preserve the  soft tissues in the vicinity.  Once this was done, I then  imaged the scaphoid, the bone was avascular, and per previous report by the  initial operation surgeon (Dr. Ninfa Linden) this bone was devoid of all soft  tissue attachments and was simply placed into its fossa and pinned.  Thus,  without any blood flow and with an avascular look as well as a focal sinus  draining from it, consistent with osteomyelitis, I felt the best course of  action would be to perform a carpectomy of the scaphoid bone.  The scaphoid  bone was thus removed in its entirety.  This was removed without  complicating features and the risk was actually fairly stable given the soft  tissue scarring.  There is no significant capitate collapse radially.  I was  pleased with this and the findings.  I then irrigated copiously with greater  than 3 liters of saline.  Following this, the patient had final fluoro shots  made, the patient was stable.  There were no complicating features.  She was  dressed after loose closure with chromic and Iodoform packing.  Sterile  dressing and plaster splint was applied.  It is our hope to rid her of the  infection.  Will plan for IV antibiotics, await cultures and monitor her  condition closely.  Once the patient is quiescent in terms of her soft  tissues, a wrist stabilization procedure can be performed if she desires.  She has an ulnar deviatory deformity given the pull of the extensor carpi  ulnaris and flexor carpi ulnaris.  This pull lends itself to decreased hand  function.  If her hand is more centralized for the remaining 3 digits  (middle, ring and small), she would likely have better function.  Thus, a  stabilization procedure in the future would likely be in her best interest  if the infection can be returned to quiescence and all parameters are  stable.  I have discussed with the patient these issues at length, __________, etc., And she will be admitted for IV antibiotics, pain   management and general observation.           ______________________________  Satira Anis. Blanchie Dessert, M.D.     Sampson Si  D:  12/24/2005  T:  12/25/2005  Job:  [id number]

## 2011-01-28 NOTE — Op Note (Signed)
NAMEARIANNIE, PENALOZA             ACCOUNT NO.:  [id number]   MEDICAL RECORD NO.:  [id number]          PATIENT TYPE:  INP   LOCATION:  5018                         FACILITY:  [facility]   PHYSICIAN:  Lind Guest. Ninfa Linden, M.D.DATE OF BIRTH:  [birth year]   DATE OF PROCEDURE:  08/31/2005  DATE OF DISCHARGE:                                 OPERATIVE REPORT   PREOPERATIVE DIAGNOSES:  1.  Right hand crush injury with a significant degloving of the thumb and      index finger.  2.  Highly comminuted index finger fracture.  3.  Highly comminuted thumb fracture.  4.  Wrist carpal bones dislocation and instability.  5.  Right radial styloid and distal radius volar lip fracture.  6.  Fifth metacarpal base fracture.  7.  Acute ischemia to degloved right index finger.  8.  Open wounds to right hand dorsum and palmar aspect as well as wrist with      exposed open fractures and obvious tendon and ligamentous injury.   POSTOPERATIVE DIAGNOSES:  1.  Right hand crush injury with a significant degloving of the thumb and      index finger.  2.  Highly comminuted index finger fracture.  3.  Highly comminuted thumb fracture.  4.  Wrist carpal bones dislocation and instability.  5.  Right radial styloid and distal radius volar lip fracture.  6.  Fifth metacarpal base fracture.  7.  Acute ischemia to degloved right index finger.  8.  Open wounds to right hand dorsum and palmar aspect as well as wrist with      exposed open fractures and obvious tendon and ligamentous injury.   PROCEDURES:  1.  Irrigation and debridement of right hand crush injury including all open      fractures.  2.  Index finger/second ray resection due to marked ischemia.  3.  Stabilization of right wrist carpal instability with open reduction and      pinning.  4.  Open reduction and pinning of the right distal radial volar lip      fracture.  5.  Attempted stabilization of completely dislocated and comminuted fracture      to  thumb.  6.  Complicated closure and soft tissue rearrangement of complex right hand      and wrist wounds.   SURGEON:  Lind Guest. Ninfa Linden, M.D.   ANESTHESIA:  General.   ANTIBIOTICS:  1 gram IV Ancef.   BLOOD LOSS:  200 mL.   TOURNIQUET TIME:  1 hour 45 minutes.   COMPLICATIONS:  None.   INDICATIONS:  Briefly, [name] is a 21-year right-hand-dominant female  who, on the day of surgery, sustained a crushing injury to her right hand  when it got caught in a molding machine.  She was brought to the emergency  room and had significant degloving injury to her right hand. There was  obviously exposed open fractures to the first and second rays of the right  hand including exposed distal radius fracture and obvious carpal instability  with exposed scaphoid. There was a large soft tissue deficit on the dorsum  of the wrist and hand as well as the volar aspect of the wrist and hand with  multiple lacerations.  The middle, ring and little fingers all had normal  perfusion in sensation however, there was significant degloving to the index  finger which showed no perfusion and no sensation.  In the ER, the finger  was easily visualized and was stripped completely of the soft tissues with  injuries to the flexor tendon as well. There was also noted complete  injuries to both ulnar and radial neurovascular bundles that were complete  injuries. This was explained to the patient.  There was also noted to be a  large degloving of the thumb was soft tissue deficit and obvious dislocation  of the thumb CMC joints with obvious bone loss and highly comminuted  fracture. The thumb was insensate and the patient did not have the ability  to feel any type of touch, sharp for soft to the thumb.  There was also  questionable loss of perfusion of the thumb.  It was recommend that we go  emergently to the operating room for stabilization of her injuries. She  agreed to proceed with surgery. The  risks, benefits of this were thoroughly  explained to her and her family including the likelihood of second ray  resection and possible of loss was the thumb.   DESCRIPTION OF PROCEDURE:  After informed consent was obtained, the  appropriate right hand and wrist were marked.  [name] was brought to  the operating room, placed supine on the operating table. Her left arm was  placed on radiolucent arm table. General anesthesia was then obtained and  her arm was prepped and draped with Betadine scrub and paint. A sterile  tourniquet was placed around her upper arm.  Her arm was not wrapped out  with an Esmarch.  The tourniquet was not inflated at the beginning of the  case.  Bulb irrigation using bacitracin solution was first irrigated  throughout the hand followed by normal saline solution. There was abundant  soft tissue deficit noted especially on the second ray and the whole index  finger was found to have comminuted fracture of each bone throughout the  first ray, both radial and ulnar neurovascular bundles were not salvageable  whatsoever with complete injuries to these, thus a second ray resection was  carried out. There was also noted to be only a dorsal skin bridge holding  the thumb in place. [facility] joint was visualized in the wound and the  trapezoid bone was completely missing.  The trapezium was highly comminuted  and the base of the thumb metacarpal past the mid shaft area was completely  comminuted and almost obliterated.  There was bone falling out of this wound  and the thumb was completely unstable.  The wrist joint itself was assessed  and found to have a complete scapholunate disruption as well as dislocation  of the proximal carpus.  There was a volar lip fracture of the distal radius  noted as well as loss of the radial styloid. Decision was made to start with  potential surgical stabilization of the carpus in the thumb and thus the arm was wrapped out with Esmarch  and the tourniquet was inflated to 250 mm of  pressure.  First the volar lip fracture of distal radius was assessed due to  the carpus instability.  Two 0.45 mm K-wires were placed from the volar lip  in a dorsal with direction to secure the  fracture.  These were brought out  the back in the wrist so wires were not exposed on the volar aspect of the  distal radius. Once this was found to be reduced and this was assessed under  live fluoroscopic guidance using mini C-arm, the scaphoid and the lunate  were teased into position and a 0.045 K-wire was placed across the scaphoid  into the lunate to further help with stability.  Another 0.045 mm pin was  then placed from the scaphoid into the radius to secure this interval. Once  the wrist was able to be held in stable position, attention was turned to  the thumb.  Again there was obliterated bone throughout the thumb and there  was question whether the thumb was perfused.  Due to the nature of the  injury, it was difficult to assess any neurovascular bundle but the thumb  was not stark white and did appear to be viable.  A 0.062 K-wire was then  fashioned from the distal metacarpal and into the remnants of the trapezium  and then spinal stabilization into the capitellum.  Once this was done, the  soft tissues were all then copiously irrigated.  The tourniquet was let down  at  one hour and 45 minutes and the fingers all remained pink including the  thumb, however, the thumb was noted to be more dusky in comparison to the  other fingers. The wounds were then all copiously irrigated again and  extensive soft tissue repair was then carried out throughout the dorsum of  the hand, dorsum of the wrist, volar aspect of the wrist and hand, so all  wounds were then completely closed including the ray resection wound.  Xeroform was then placed throughout the wounds of the hand and well-padded  sterile dressing was placed on the hand followed by a plaster  splint to  allow for stabilization of the hand.  The patient was then awakened,  extubated and taken to the recovery room in stable condition.  Postoperatively she will be followed closely to continue to assess the  perfusion of the hand.  At some point attention will have to be addressed to  eventually bone grafting of the first ray and even the possibility of tendon  transfers.           ______________________________  Lind Guest. Ninfa Linden, M.D.     CYB/MEDQ  D:  08/31/2005  T:  09/02/2005  Job:  [id number]

## 2011-07-14 ENCOUNTER — Ambulatory Visit (INDEPENDENT_AMBULATORY_CARE_PROVIDER_SITE_OTHER): Payer: Medicare Other | Admitting: Gynecology

## 2011-07-14 ENCOUNTER — Other Ambulatory Visit (HOSPITAL_COMMUNITY)
Admission: RE | Admit: 2011-07-14 | Discharge: 2011-07-14 | Disposition: A | Discharge status: Home / Self Care | Payer: Medicare Other | Source: Ambulatory Visit | Attending: Gynecology | Admitting: Gynecology

## 2011-07-14 ENCOUNTER — Encounter: Payer: Self-pay | Admitting: *Deleted

## 2011-07-14 VITALS — BP 110/70 | Ht 62.0 in | Wt 132.0 lb

## 2011-07-14 DIAGNOSIS — N898 Other specified noninflammatory disorders of vagina: Secondary | ICD-10-CM

## 2011-07-14 DIAGNOSIS — Z01419 Encounter for gynecological examination (general) (routine) without abnormal findings: Secondary | ICD-10-CM

## 2011-07-14 DIAGNOSIS — Z131 Encounter for screening for diabetes mellitus: Secondary | ICD-10-CM

## 2011-07-14 DIAGNOSIS — Z13220 Encounter for screening for lipoid disorders: Secondary | ICD-10-CM

## 2011-07-14 DIAGNOSIS — F4310 Post-traumatic stress disorder, unspecified: Secondary | ICD-10-CM | POA: Insufficient documentation

## 2011-07-14 DIAGNOSIS — B373 Candidiasis of vulva and vagina: Secondary | ICD-10-CM

## 2011-07-14 DIAGNOSIS — E7800 Pure hypercholesterolemia, unspecified: Secondary | ICD-10-CM

## 2011-07-14 DIAGNOSIS — N950 Postmenopausal bleeding: Secondary | ICD-10-CM

## 2011-07-14 MED ORDER — FLUCONAZOLE 150 MG PO TABS: 150.0000 mg | ORAL_TABLET | Freq: Once | ORAL | Status: AC

## 2011-07-14 NOTE — Progress Notes (Signed)
[name] [birth year] [id number]        50 y.o.  for annual exam.  Former patient of Dr. Leota Sauers presents complaining of dyspareunia and postcoital bleeding.  Past medical history,surgical history, medications, allergies, family history and social history were all reviewed and documented in the EPIC chart. ROS:  Was performed and pertinent positives and negatives are included in the history.  Exam: chaperone present Filed Vitals:   07/14/11 1129  BP: 110/70   General appearance  Normal Skin grossly normal Head/Neck normal with no cervical or supraclavicular adenopathy thyroid normal Lungs  clear Cardiac RR, without RMG Abdominal  soft, nontender, without masses, organomegaly or hernia Breasts  examined lying and sitting without masses, retractions, discharge or axillary adenopathy. Pelvic  Ext/BUS/vagina with mild atrophic changes area of pigmentation at the hymenal ring posterior fourchette. Slight white discharge noted  Cervix  normal  Pap done  Uterus  axial, normal size, shape and contour, midline and mobile nontender   Adnexa  Without masses or tenderness    Anus and perineum  normal   Rectovaginal  normal sphincter tone without palpated masses or tenderness. Old external hemorrhoids   Assessment/Plan:  50 y.o. female for annual exam.    1. Postmenopausal bleeding. The patient notes the last 2 years or so spotting to light bleeding after intercourse. She also notes that this occurs sporadically sometimes daily. No pain with this. She did have evaluation by Dr. Angeline Slim February 2011 to include a cervical biopsy showing chronic cervicitis and an endometrial biopsy showing fragments of benign endometrial polyp and proliferative endometrium. Per the patient's history nothing further was done after that. Recommend we proceed with sonohysterogram to evaluate for remaining endometrial polyp and she agrees to schedule this. Pap was also done today. 2. Dyspareunia. Patient  does note some discomfort with intercourse as a deep throbbing type discomfort and again will follow up with ultrasound, her exam today is normal and then we'll go from there. 3. White discharge.  KOH wet prep is positive for yeast. We'll treat with Diflucan 150x1 dose follow up if symptoms develop. 4. Pigmented area posterior fourchette. Patient has never noticed this but admits to never looking. It's benign in appearance and probably is the consequence of her 6 vaginal deliveries. I do think that we should sample an area just to rule out atypia. I reviewed with her that if we proceed with hysteroscopy D&C because of an endometrial polyp I will sample this area at that time if we do not proceed with hysteroscopy I will sample this in the office at a follow up appointment. She knows importance of follow up. 5. Health maintenance. SBE monthly reviewed. Overdue for mammogram as she knows importance to schedule this. Has never had colonoscopy and I recommended she proceed with a screening colonoscopy and she agrees to arrange this. We'll plan bone density sometime over the next several years. Baseline labs to include CBC glucose lipid profile urinalysis and vitamin D level were ordered. Patient will follow up for her sonohysterogram and we'll go from there.    Dara Lords MD, 12:06 PM 07/14/2011

## 2011-07-15 ENCOUNTER — Encounter: Payer: Self-pay | Admitting: Gynecology

## 2011-07-15 LAB — VITAMIN D 25 HYDROXY (VIT D DEFICIENCY, FRACTURES): Vit D, 25-Hydroxy: 43 ng/mL (ref 30–89)

## 2011-07-18 ENCOUNTER — Other Ambulatory Visit: Payer: Self-pay | Admitting: *Deleted

## 2011-07-18 DIAGNOSIS — E7800 Pure hypercholesterolemia, unspecified: Secondary | ICD-10-CM

## 2011-07-27 ENCOUNTER — Ambulatory Visit (INDEPENDENT_AMBULATORY_CARE_PROVIDER_SITE_OTHER): Payer: Medicare Other

## 2011-07-27 ENCOUNTER — Other Ambulatory Visit: Payer: Self-pay | Admitting: Gynecology

## 2011-07-27 ENCOUNTER — Encounter: Payer: Self-pay | Admitting: Gynecology

## 2011-07-27 ENCOUNTER — Ambulatory Visit (INDEPENDENT_AMBULATORY_CARE_PROVIDER_SITE_OTHER): Payer: Medicare Other | Admitting: Gynecology

## 2011-07-27 VITALS — BP 130/70

## 2011-07-27 DIAGNOSIS — N840 Polyp of corpus uteri: Secondary | ICD-10-CM

## 2011-07-27 DIAGNOSIS — E7800 Pure hypercholesterolemia, unspecified: Secondary | ICD-10-CM

## 2011-07-27 DIAGNOSIS — N950 Postmenopausal bleeding: Secondary | ICD-10-CM

## 2011-07-27 DIAGNOSIS — N83339 Acquired atrophy of ovary and fallopian tube, unspecified side: Secondary | ICD-10-CM

## 2011-07-27 NOTE — Progress Notes (Signed)
Patient presents percent hysterogram due to postmenopausal bleeding and deep dyspareunia.  Sonohysterogram: Endometrial echo of 8.2 mm no myometrial abnormalities. Right and left ovaries visualized and atrophic. Sonohysterogram performed sterile technique easy catheter introduction good distention with a clear large endometrial polyp measuring 19 x 14 mm. Endometrial sample was taken. Patient tolerated well.  Assessment and plan: 1. Postmenopausal bleeding. Patient has large endometrial polyp I recommend we proceed with hysteroscopy D&C with removal of the polyp. I discussed was involved with the procedure to include instrumentation used the resectoscope D&C portion. Risks of bleeding hemorrhage necessitating transfusion, infection requiring prolonged antibiotics, uterine perforation with damage to internal organs including bowel bladder ureters vessels and nerves either immediately recognized or delay recognized, requiring major exploratory reparative surgeries bowel resection bladder repair ureteral damage repair ostomy formation all discussed understood and accepted. The risk of distended media absorption leading to metabolic complications such as coma and seizures was also discussed. Patient was going to schedule this she'll follow up with me for a preop consult before hand. 2. Dyspareunia. I reviewed with her that the ultrasound is normal as far as ovarian disease or myometrial abnormalities.  Other possibilities such as adhesive disease, endometriosis or atrophic vaginal changes was discussed. We'll plan on hysteroscopy D&C first we'll see how this affects her discomfort and we'll go from there she understands the possibility of further treatment down the road as far as her dyspareunia is concerned. I did offer laparoscopy at this time, I think given the total picture that we will hold on this and she is comfortable with that.

## 2011-07-28 ENCOUNTER — Telehealth: Payer: Self-pay

## 2011-07-28 MED ORDER — MISOPROSTOL 200 MCG PO TABS: ORAL_TABLET | ORAL | Status: DC

## 2011-07-28 NOTE — Telephone Encounter (Signed)
I called patient and scheduled surgery for Thursday, Dec 6 at 7:30am at [facility].  She will come for short preop appt, per Dr. Loetta Rough, on Monday, Dec 3 at 10:30am.  I instructed her regarding using the Cytotec tab vag hs before surgery and e-scribed it to her pharmacy.  ka

## 2011-08-15 ENCOUNTER — Encounter (HOSPITAL_BASED_OUTPATIENT_CLINIC_OR_DEPARTMENT_OTHER): Payer: Self-pay | Admitting: *Deleted

## 2011-08-15 ENCOUNTER — Encounter: Payer: Self-pay | Admitting: Gynecology

## 2011-08-15 ENCOUNTER — Ambulatory Visit (INDEPENDENT_AMBULATORY_CARE_PROVIDER_SITE_OTHER): Payer: Medicare Other | Admitting: Gynecology

## 2011-08-15 DIAGNOSIS — N950 Postmenopausal bleeding: Secondary | ICD-10-CM

## 2011-08-15 DIAGNOSIS — N840 Polyp of corpus uteri: Secondary | ICD-10-CM

## 2011-08-15 DIAGNOSIS — N898 Other specified noninflammatory disorders of vagina: Secondary | ICD-10-CM

## 2011-08-15 NOTE — H&P (Signed)
  [name] 12/28/1960 [id number]   History and Physical    Chief complaint: Postmenopausal, postcoital bleeding, endometrial polyp, pigmented vaginal area  History of present illness: 50 y.o. G6P6 postmenopausal female with a history of post coital/postmenopausal bleeding over the last 2 years. Outpatient evaluation included a sonohysterogram which showed a large endometrial polyp and a pigmented area posterior fourchette that the patient never noticed before. Patient is admitted for hysteroscopy resection of polyp D&C excision of the pigmented vaginal area.   Past medical history,surgical history, medications, allergies, family history and social history were all reviewed and documented in the EPIC chart. ROS:  Was performed and pertinent positives and negatives are included in the history of present illness.  Exam: Afebrile vital signs are stable  General: well developed, well nourished female, no acute distress HEENT: normal  Lungs: clear to auscultation without wheezing, rales or rhonchi  Cardiac: regular rate without rubs, murmurs or gallops  Abdomen: soft, nontender without masses, guarding, rebound, organomegaly  Pelvic: external bus vagina: pigmented area midline posterior fourchette approximately 7 mm across.   Cervix: grossly normal  Uterus: normal size, midline and mobile, nontender  Adnexa: without masses or tenderness      Assessment/Plan:  Post menopausal, postcoital bleeding with sonohysterogram showing large endometrial polyp for hysteroscopy D&C resection of polyp. She also has a vaginal pigmented area, benign in appearance, although never noticed by the patient and we'll go ahead and excise this at the same time as her other surgery. I reviewed the proposed surgeries with her and the expected intraoperative / postoperative courses and recovery precautions. I reviewed the hysteroscopy D&C including the use of the hysteroscope, resectoscope and D&C portion of the  procedure. The risks of infection requiring prolonged antibiotics, hemorrhage necessitating transfusion and the risks of transfusion including transfusion reaction, hepatitis, HIV, mad cow disease and other unknown entities. The risk of uterine perforation and damage to internal organs either immediately recognized or delay recognized including bowel, bladder, ureters, vessels and nerves necessitating major exploratory reparative surgeries and future reparative surgeries including bowel resection, bladder repair, ureteral damage repair, ostomy formation was all discussed understood and accepted. Distended media absorption leading to metabolic complications including coma and seizures was also reviewed. I discussed the vaginal excision portion and the risks to include scar formation, persistent pain in the area and dyspareunia. I also reviewed the risks of damage to surrounding structures such as rectum and bladder. She understands there are no guarantees as far as her bleeding is concerned. She is having some issues with deep dyspareunia and she understands the this will probably continue following the surgery and again no guarantees were made. The patient's questions were answered to her satisfaction she is ready to proceed with surgery.        Dara Lords MD, 11:57 AM 08/15/2011

## 2011-08-15 NOTE — Progress Notes (Signed)
[name] May 12, 1961 [id number]  Preoperative Appointment   Chief complaint: Postmenopausal, postcoital bleeding, endometrial polyp, pigmented vaginal area  History of present illness: 50 y.o. G6P6 postmenopausal female with a history of post coital/postmenopausal bleeding over the last 2 years. Outpatient evaluation included a sonohysterogram which showed a large endometrial polyp and a pigmented area posterior fourchette that the patient never noticed before. Patient is admitted for hysteroscopy resection of polyp D&C excision of the pigmented vaginal area.   Exam: Afebrile vital signs are stable  General: well developed, well nourished female, no acute distress HEENT: normal  Lungs: clear to auscultation without wheezing, rales or rhonchi  Cardiac: regular rate without rubs, murmurs or gallops  Abdomen: soft, nontender without masses, guarding, rebound, organomegaly  Pelvic: external bus vagina: pigmented area midline posterior fourchette approximately 7 mm across.   Cervix: grossly normal  Uterus: normal size, midline and mobile, nontender  Adnexa: without masses or tenderness      Assessment/Plan:  Post menopausal, postcoital bleeding with sonohysterogram showing large endometrial polyp for hysteroscopy D&C resection of polyp. She also has a vaginal pigmented area, benign in appearance, although never noticed by the patient and we'll go ahead and excise this at the same time as her other surgery. I reviewed the proposed surgeries with her and the expected intraoperative / postoperative courses and recovery precautions. I reviewed the hysteroscopy D&C including the use of the hysteroscope, resectoscope and D&C portion of the procedure. The risks of infection requiring prolonged antibiotics, hemorrhage necessitating transfusion and the risks of transfusion including transfusion reaction, hepatitis, HIV, mad cow disease and other unknown entities. The risk of uterine perforation and  damage to internal organs either immediately recognized or delay recognized including bowel, bladder, ureters, vessels and nerves necessitating major exploratory reparative surgeries and future reparative surgeries including bowel resection, bladder repair, ureteral damage repair, ostomy formation was all discussed understood and accepted. Distended media absorption leading to metabolic complications including coma and seizures was also reviewed. I discussed the vaginal excision portion and the risks to include scar formation, persistent pain in the area and dyspareunia. I also reviewed the risks of damage to surrounding structures such as rectum and bladder. She understands there are no guarantees as far as her bleeding is concerned. She is having some issues with deep dyspareunia and she understands the this will probably continue following the surgery and again no guarantees were made. The patient's questions were answered to her satisfaction she is ready to proceed with surgery.    Dara Lords MD, 11:14 AM 08/15/2011

## 2011-08-16 ENCOUNTER — Encounter (HOSPITAL_BASED_OUTPATIENT_CLINIC_OR_DEPARTMENT_OTHER): Payer: Self-pay | Admitting: *Deleted

## 2011-08-16 NOTE — Progress Notes (Signed)
NPO AFTER MN. PT ARRIVES AT 0600. NEEDS HG, CMET, AND EKG.

## 2011-08-18 ENCOUNTER — Encounter (HOSPITAL_BASED_OUTPATIENT_CLINIC_OR_DEPARTMENT_OTHER): Payer: Self-pay | Admitting: Anesthesiology

## 2011-08-18 ENCOUNTER — Other Ambulatory Visit: Payer: Self-pay

## 2011-08-18 ENCOUNTER — Other Ambulatory Visit: Payer: Self-pay | Admitting: Gynecology

## 2011-08-18 ENCOUNTER — Ambulatory Visit (HOSPITAL_BASED_OUTPATIENT_CLINIC_OR_DEPARTMENT_OTHER): Payer: Medicare Other | Admitting: Anesthesiology

## 2011-08-18 ENCOUNTER — Encounter (HOSPITAL_BASED_OUTPATIENT_CLINIC_OR_DEPARTMENT_OTHER)
Admission: RE | Disposition: A | Discharge status: Home / Self Care | Payer: Self-pay | Source: Ambulatory Visit | Attending: Gynecology

## 2011-08-18 ENCOUNTER — Ambulatory Visit (HOSPITAL_BASED_OUTPATIENT_CLINIC_OR_DEPARTMENT_OTHER)
Admission: RE | Admit: 2011-08-18 | Discharge: 2011-08-18 | Disposition: A | Discharge status: Home / Self Care | Payer: Medicare Other | Source: Ambulatory Visit | Attending: Gynecology | Admitting: Gynecology

## 2011-08-18 DIAGNOSIS — N840 Polyp of corpus uteri: Secondary | ICD-10-CM | POA: Insufficient documentation

## 2011-08-18 DIAGNOSIS — L819 Disorder of pigmentation, unspecified: Secondary | ICD-10-CM | POA: Insufficient documentation

## 2011-08-18 DIAGNOSIS — N9089 Other specified noninflammatory disorders of vulva and perineum: Secondary | ICD-10-CM

## 2011-08-18 DIAGNOSIS — N898 Other specified noninflammatory disorders of vagina: Secondary | ICD-10-CM | POA: Insufficient documentation

## 2011-08-18 DIAGNOSIS — N950 Postmenopausal bleeding: Secondary | ICD-10-CM | POA: Insufficient documentation

## 2011-08-18 HISTORY — PX: HYSTEROSCOPY: SHX211

## 2011-08-18 HISTORY — DX: Gastro-esophageal reflux disease without esophagitis: K21.9

## 2011-08-18 HISTORY — PX: LESION REMOVAL: SHX5196

## 2011-08-18 HISTORY — DX: Cerebral infarction, unspecified: I63.9

## 2011-08-18 HISTORY — DX: Chronic pain due to trauma: G89.21

## 2011-08-18 HISTORY — PX: HYSTEROSCOPY W/D&C: SHX1775

## 2011-08-18 HISTORY — DX: Unspecified malignant neoplasm of skin, unspecified: C44.90

## 2011-08-18 HISTORY — DX: Other symptoms and signs involving the musculoskeletal system: R29.898

## 2011-08-18 HISTORY — PX: HYSTEROSCOPY WITH D & C: SHX1775

## 2011-08-18 HISTORY — DX: Crushing injury of unspecified hand, initial encounter: S67.20XA

## 2011-08-18 LAB — COMPREHENSIVE METABOLIC PANEL
ALT: 15 U/L (ref 0–35)
AST: 20 U/L (ref 0–37)
Albumin: 3.9 g/dL (ref 3.5–5.2)
Alkaline Phosphatase: 56 U/L (ref 39–117)
CO2: 25 mEq/L (ref 19–32)
Chloride: 104 mEq/L (ref 96–112)
Creatinine, Ser: 0.7 mg/dL (ref 0.50–1.10)
GFR calc non Af Amer: >90 mL/min (ref >90)
Potassium: 3.9 mEq/L (ref 3.5–5.1)
Total Bilirubin: 0.2 mg/dL — ABNORMAL LOW (ref 0.3–1.2)

## 2011-08-18 LAB — HEMOGLOBIN AND HEMATOCRIT, BLOOD: HCT: 38.1 % (ref 36.0–46.0)

## 2011-08-18 SURGERY — DILATATION AND CURETTAGE /HYSTEROSCOPY
Anesthesia: General

## 2011-08-18 SURGERY — DILATATION AND CURETTAGE /HYSTEROSCOPY
Anesthesia: General | Site: Vagina | Wound class: Clean Contaminated

## 2011-08-18 MED ORDER — KETOROLAC TROMETHAMINE 30 MG/ML IJ SOLN
INTRAMUSCULAR | Status: DC | PRN
  Administered 2011-08-18: 30 mg via INTRAVENOUS

## 2011-08-18 MED ORDER — FENTANYL CITRATE 0.05 MG/ML IJ SOLN
INTRAMUSCULAR | Status: DC | PRN
  Administered 2011-08-18 (×2): 25 ug via INTRAVENOUS
  Administered 2011-08-18: 50 ug via INTRAVENOUS

## 2011-08-18 MED ORDER — DEXTROSE-NACL 5-0.9 % IV SOLN: INTRAVENOUS | Status: DC

## 2011-08-18 MED ORDER — ONDANSETRON HCL 4 MG/2ML IJ SOLN
INTRAMUSCULAR | Status: DC | PRN
  Administered 2011-08-18: 4 mg via INTRAVENOUS

## 2011-08-18 MED ORDER — DEXTROSE 5 % IV SOLN
1.0000 g | INTRAVENOUS | Status: AC
  Administered 2011-08-18: 1 g via INTRAVENOUS

## 2011-08-18 MED ORDER — BUPIVACAINE HCL 0.25 % IJ SOLN
INTRAMUSCULAR | Status: DC | PRN
  Administered 2011-08-18: 2 mL

## 2011-08-18 MED ORDER — LIDOCAINE HCL (CARDIAC) 20 MG/ML IV SOLN
INTRAVENOUS | Status: DC | PRN
  Administered 2011-08-18: 60 mg via INTRAVENOUS

## 2011-08-18 MED ORDER — PROPOFOL 10 MG/ML IV EMUL
INTRAVENOUS | Status: DC | PRN
  Administered 2011-08-18: 200 mg via INTRAVENOUS

## 2011-08-18 MED ORDER — DEXAMETHASONE SODIUM PHOSPHATE 4 MG/ML IJ SOLN
INTRAMUSCULAR | Status: DC | PRN
  Administered 2011-08-18: 10 mg via INTRAVENOUS

## 2011-08-18 MED ORDER — MIDAZOLAM HCL 5 MG/5ML IJ SOLN
INTRAMUSCULAR | Status: DC | PRN
  Administered 2011-08-18 (×2): 1 mg via INTRAVENOUS

## 2011-08-18 MED ORDER — HYDROMORPHONE HCL PF 1 MG/ML IJ SOLN: 0.2500 mg | INTRAMUSCULAR | Status: DC | PRN

## 2011-08-18 MED ORDER — FENTANYL CITRATE 0.05 MG/ML IJ SOLN
25.0000 ug | INTRAMUSCULAR | Status: DC | PRN
  Administered 2011-08-18 (×3): 25 ug via INTRAVENOUS

## 2011-08-18 MED ORDER — LACTATED RINGERS IV SOLN
INTRAVENOUS | Status: DC
  Administered 2011-08-18: 07:00:00 via INTRAVENOUS

## 2011-08-18 MED ORDER — GLYCINE 1.5 % IR SOLN
Status: DC | PRN
  Administered 2011-08-18: 3000 mL

## 2011-08-18 MED ORDER — TRAMADOL HCL 50 MG PO TABS: 50.0000 mg | ORAL_TABLET | Freq: Four times a day (QID) | ORAL | Status: AC | PRN

## 2011-08-18 SURGICAL SUPPLY — 53 items
BAG DECANTER FOR FLEXI CONT (MISCELLANEOUS) IMPLANT
BENZOIN TINCTURE PRP APPL 2/3 (GAUZE/BANDAGES/DRESSINGS) IMPLANT
BLADE SURG 15 STRL LF DISP TIS (BLADE) ×2 IMPLANT
BLADE SURG 15 STRL SS (BLADE) ×1
CANISTER SUCTION 1200CC (MISCELLANEOUS) IMPLANT
CANISTER SUCTION 2500CC (MISCELLANEOUS) ×3 IMPLANT
CATH ROBINSON RED A/P 16FR (CATHETERS) ×3 IMPLANT
CLOTH BEACON ORANGE TIMEOUT ST (SAFETY) ×3 IMPLANT
CORD ACTIVE DISPOSABLE (ELECTRODE) ×1
CORD ELECTRO ACTIVE DISP (ELECTRODE) ×2 IMPLANT
COVER TABLE BACK 60X90 (DRAPES) ×3 IMPLANT
DRAPE CAMERA CLOSED 9X96 (DRAPES) ×3 IMPLANT
DRAPE LG THREE QUARTER DISP (DRAPES) ×3 IMPLANT
DRAPE UNDERBUTTOCKS STRL (DRAPE) ×3 IMPLANT
DRESSING TELFA 8X3 (GAUZE/BANDAGES/DRESSINGS) ×3 IMPLANT
ELECT LOOP GYNE PRO 24FR (CUTTING LOOP) ×3
ELECT NEEDLE TIP 2.8 STRL (NEEDLE) IMPLANT
ELECT REM PT RETURN 9FT ADLT (ELECTROSURGICAL) ×3
ELECT VAPORTRODE GRVD BAR (ELECTRODE) IMPLANT
ELECTRODE LOOP GYNE PRO 24FR (CUTTING LOOP) ×2 IMPLANT
ELECTRODE REM PT RTRN 9FT ADLT (ELECTROSURGICAL) ×2 IMPLANT
GLOVE BIO SURGEON STRL SZ7.5 (GLOVE) ×6 IMPLANT
GLOVE ECLIPSE 6.5 STRL STRAW (GLOVE) ×3 IMPLANT
GLOVE INDICATOR 6.5 STRL GRN (GLOVE) ×3 IMPLANT
GOWN PREVENTION PLUS LG XLONG (DISPOSABLE) ×3 IMPLANT
GOWN STRL REIN XL XLG (GOWN DISPOSABLE) ×3 IMPLANT
GOWN W/COTTON TOWEL STD LRG (GOWNS) ×3 IMPLANT
GOWN XL W/COTTON TOWEL STD (GOWNS) ×3 IMPLANT
JUMPSUIT BLUE BOOT COVER DISP (PROTECTIVE WEAR) ×3 IMPLANT
LEGGING LITHOTOMY PAIR STRL (DRAPES) ×3 IMPLANT
NDL SAFETY ECLIPSE 18X1.5 (NEEDLE) IMPLANT
NEEDLE HYPO 18GX1.5 SHARP (NEEDLE)
NEEDLE HYPO 25X1 1.5 SAFETY (NEEDLE) ×3 IMPLANT
NEEDLE SPNL 22GX3.5 QUINCKE BK (NEEDLE) ×3 IMPLANT
NS IRRIG 500ML POUR BTL (IV SOLUTION) IMPLANT
PACK BASIN DAY SURGERY FS (CUSTOM PROCEDURE TRAY) ×3 IMPLANT
PAD OB MATERNITY 4.3X12.25 (PERSONAL CARE ITEMS) ×3 IMPLANT
PAD PREP 24X48 CUFFED NSTRL (MISCELLANEOUS) ×3 IMPLANT
PENCIL BUTTON HOLSTER BLD 10FT (ELECTRODE) ×3 IMPLANT
STRIP CLOSURE SKIN 1/4X4 (GAUZE/BANDAGES/DRESSINGS) IMPLANT
SUT MNCRL AB 3-0 PS2 18 (SUTURE) IMPLANT
SUT PLAIN 3 0 FS 2 27 (SUTURE) IMPLANT
SWAB CULTURE LIQ STUART DBL (MISCELLANEOUS) IMPLANT
SYR BULB IRRIGATION 50ML (SYRINGE) IMPLANT
SYR CONTROL 10ML LL (SYRINGE) ×3 IMPLANT
SYR TB 1ML LL NO SAFETY (SYRINGE) IMPLANT
TOWEL OR 17X24 6PK STRL BLUE (TOWEL DISPOSABLE) ×6 IMPLANT
TRAY DSU PREP LF (CUSTOM PROCEDURE TRAY) ×3 IMPLANT
TUBE ANAEROBIC SPECIMEN COL (MISCELLANEOUS) IMPLANT
TUBE CONNECTING 12X1/4 (SUCTIONS) IMPLANT
TUBING HYDROFLEX HYSTEROSCOPY (TUBING) ×3 IMPLANT
WATER STERILE IRR 500ML POUR (IV SOLUTION) ×3 IMPLANT
YANKAUER SUCT BULB TIP NO VENT (SUCTIONS) IMPLANT

## 2011-08-18 NOTE — H&P (Signed)
  The patient was examined.  I reviewed the proposed surgery and consent form with the patient.  The dictated history and physical is current and accurate and all questions were answered. The patient is ready to proceed with surgery and has a realistic understanding and expectation for the outcome.   Dara Lords MD, 7:13 AM 08/18/2011

## 2011-08-18 NOTE — Anesthesia Preprocedure Evaluation (Addendum)
Anesthesia Evaluation  Patient identified by MRN, date of birth, ID band Patient awake    Reviewed: Allergy & Precautions, H&P , NPO status , Patient's Chart, lab work & pertinent test results  Airway Mallampati: I TM Distance: >3 FB Neck ROM: Full    Dental No notable dental hx. (+) Upper Dentures and Lower Dentures   Pulmonary neg pulmonary ROS, asthma ,  clear to auscultation  Pulmonary exam normal       Cardiovascular neg cardio ROS Regular Normal    Neuro/Psych PSYCHIATRIC DISORDERS Chronic pain CVA Negative Neurological ROS  Negative Psych ROS   GI/Hepatic negative GI ROS, Neg liver ROS, GERD-  ,  Endo/Other  Negative Endocrine ROS  Renal/GU negative Renal ROS  Genitourinary negative   Musculoskeletal negative musculoskeletal ROS (+)   Abdominal   Peds negative pediatric ROS (+)  Hematology negative hematology ROS (+)   Anesthesia Other Findings   Reproductive/Obstetrics negative OB ROS                          Anesthesia Physical Anesthesia Plan  ASA: III  Anesthesia Plan: General   Post-op Pain Management:    Induction: Intravenous  Airway Management Planned: LMA  Additional Equipment:   Intra-op Plan:   Post-operative Plan:   Informed Consent: I have reviewed the patients History and Physical, chart, labs and discussed the procedure including the risks, benefits and alternatives for the proposed anesthesia with the patient or authorized representative who has indicated his/her understanding and acceptance.   Dental advisory given  Plan Discussed with: CRNA  Anesthesia Plan Comments:         Anesthesia Quick Evaluation

## 2011-08-18 NOTE — Anesthesia Postprocedure Evaluation (Signed)
  Anesthesia Post-op Note  Patient: [name]  Procedure(s) Performed:  DILATATION AND CURETTAGE (D&C) /HYSTEROSCOPY; EXCISION VAGINAL LESION  Patient Location: PACU  Anesthesia Type: General  Level of Consciousness: awake and alert   Airway and Oxygen Therapy: Patient Spontanous Breathing  Post-op Pain: mild  Post-op Assessment: Post-op Vital signs reviewed, Patient's Cardiovascular Status Stable, Respiratory Function Stable, Patent Airway and No signs of Nausea or vomiting  Post-op Vital Signs: stable  Complications: No apparent anesthesia complications

## 2011-08-18 NOTE — Op Note (Signed)
[name] [birth year] [id number]   Post Operative Note   Date of surgery:  08/18/2011  Pre Op Dx: postmenopausal bleeding, endometrial polyp, pigmented vaginal lesion  Post Op Dx  same  Procedure:  Hysteroscopy, resection of endometrial polyp, D&C, excision pigmented vaginal lesion  Surgeon:  Dara Lords  Assistant:  none  Anesthesia:  General  Local Injection:  0.25% Marcaine vaginal injection 2 cc  EBL:  Minimal  Glycine discrepancy: 90 cc  Complications:  None  Specimen:  #1 endometrial polyp #2 endometrial curettings #3 pigmented vaginal lesion to pathology   Findings: EUA:  External BUS vagina with 5 mm pigmented area vaginal mucosa midline posterior fourchette, cervix normal, uterus normal size midline mobile, adnexa without masses   Operative:  Hysteroscopy with large endometrial polyp emanating from the left lateral wall excised in its entirety.  Hysteroscopy otherwise normal noting right tubal ostia, fundus, anterior posterior uterine surfaces, lower uterine segment and endocervical canal all visualized. Left tubal ostia not visualized and felt to be obscured by the polyp.   Procedure:  Patient was taken to the operating room, underwent general anesthesia, was placed low dorsal lithotomy position and received a perineal / vaginal preparation with Betadine solution. Patient was draped in usual fashion and an EUA performed. The cervix was visualized with a weighted speculum, anterior lip grasped with a single-tooth tenaculum and the cervix was gently and gradually dilated to admit the operative hysteroscope. Hysteroscopy was performed with findings noted above. Using the right angle resectoscopic loop the endometrial polyp was excised in its entirety in several passes to the level of the surrounding endometrium. A sharp curettage was then performed. Both specimens were sent separately to pathology. Repeat hysteroscopy showed an empty cavity, good distention,  adequate hemostasis and no evidence of perforation. The tenaculum was removed and attention was then directed towards the pigmented vaginal lesion. This area was elevated and excised in an elliptical incision including all the visual pigmented area. This was sent to pathology. The vaginal mucosa was then reapproximated using 3-0 Vicryl in a running interlocking stitch. 0.25% Marcaine was then injected into the underlying tissues. Patient was then placed in the supine position awakened without difficulty and taken to recovery in good condition having tolerated the procedure well   Dara Lords MD, 8:41 AM 08/18/2011

## 2011-08-18 NOTE — Transfer of Care (Signed)
Immediate Anesthesia Transfer of Care Note  Patient: [name]  Procedure(s) Performed:  DILATATION AND CURETTAGE (D&C) /HYSTEROSCOPY; EXCISION VAGINAL LESION  Patient Location: PACU  Anesthesia Type: General  Level of Consciousness: awake, alert  and oriented  Airway & Oxygen Therapy: Patient Spontanous Breathing and Patient connected to face mask oxygen  Post-op Assessment: Report given to PACU RN and Post -op Vital signs reviewed and stable  Post vital signs: Reviewed and stable  Complications: No apparent anesthesia complications

## 2011-08-18 NOTE — Anesthesia Procedure Notes (Signed)
Procedure Name: LMA Insertion Date/Time: 08/18/2011 7:32 AM Performed by: Huel Coventry Pre-anesthesia Checklist: Patient identified, Emergency Drugs available, Suction available and Patient being monitored Patient Re-evaluated:Patient Re-evaluated prior to inductionOxygen Delivery Method: Circle System Utilized Preoxygenation: Pre-oxygenation with 100% oxygen Intubation Type: IV induction Ventilation: Mask ventilation without difficulty LMA: LMA inserted LMA Size: 3.0 Number of attempts: 1 Airway Equipment and Method: bite block Placement Confirmation: positive ETCO2 Tube secured with: Tape Dental Injury: Teeth and Oropharynx as per pre-operative assessment

## 2011-08-19 ENCOUNTER — Encounter (HOSPITAL_BASED_OUTPATIENT_CLINIC_OR_DEPARTMENT_OTHER): Payer: Self-pay | Admitting: Gynecology

## 2011-08-31 ENCOUNTER — Encounter: Payer: Self-pay | Admitting: Gynecology

## 2011-08-31 ENCOUNTER — Ambulatory Visit (INDEPENDENT_AMBULATORY_CARE_PROVIDER_SITE_OTHER): Payer: Medicare Other | Admitting: Gynecology

## 2011-08-31 DIAGNOSIS — Z9889 Other specified postprocedural states: Secondary | ICD-10-CM

## 2011-08-31 NOTE — Progress Notes (Signed)
Patient presents postoperative status post hysteroscopic endometrial polypectomy and excision of the pigmented vaginal lesion. Pathology was benign for both. She is being irritated by the vaginal sutures.  Exam with chaperone present External BUS vagina with a row of her Vicryl suture in place posteriorly. I went ahead and removed this. Otherwise bimanual without masses or tenderness  Assessment and plan history of postmenopausal bleeding status post hysteroscopic resection endometrial polyp and excision of a pigmented vaginal area. The pathology was benign. Patient will follow up in a year for her annual for her she has any recurrent bleeding or any other issues.

## 2011-08-31 NOTE — Patient Instructions (Signed)
Report any vaginal bleeding. Otherwise assuming you continue well that I will see you in one year for your annual GYN checkup.

## 2011-09-08 ENCOUNTER — Encounter: Payer: Self-pay | Admitting: Gynecology

## 2012-08-07 ENCOUNTER — Encounter: Payer: Self-pay | Admitting: Gynecology

## 2012-08-07 ENCOUNTER — Ambulatory Visit (INDEPENDENT_AMBULATORY_CARE_PROVIDER_SITE_OTHER): Payer: Medicare Other | Admitting: Gynecology

## 2012-08-07 VITALS — BP 124/72 | Ht 61.75 in | Wt 134.0 lb

## 2012-08-07 DIAGNOSIS — N952 Postmenopausal atrophic vaginitis: Secondary | ICD-10-CM

## 2012-08-07 DIAGNOSIS — N898 Other specified noninflammatory disorders of vagina: Secondary | ICD-10-CM

## 2012-08-07 DIAGNOSIS — Z20828 Contact with and (suspected) exposure to other viral communicable diseases: Secondary | ICD-10-CM

## 2012-08-07 LAB — WET PREP FOR TRICH, YEAST, CLUE: Yeast Wet Prep HPF POC: NONE SEEN

## 2012-08-07 MED ORDER — METRONIDAZOLE 500 MG PO TABS: 500.0000 mg | ORAL_TABLET | Freq: Two times a day (BID) | ORAL | Status: DC

## 2012-08-07 NOTE — Patient Instructions (Signed)
Follow up in one year for GYN exam. Follow up with your primary physician for blood work and follow up of your medical issues.

## 2012-08-07 NOTE — Progress Notes (Signed)
[name] 26-Mar-1961 [id number]        51 y.o.  G6P6 for follow up exam.  Several issues noted below  Past medical history,surgical history, medications, allergies, family history and social history were all reviewed and documented in the EPIC chart. ROS:  Was performed and pertinent positives and negatives are included in the history.  Exam: [name] assistant Filed Vitals:   08/07/12 1140  BP: 124/72  Height: 5' 1.75" (1.568 m)  Weight: 134 lb (60.782 kg)   General appearance  Normal Skin grossly normal Head/Neck normal with no cervical or supraclavicular adenopathy thyroid normal Lungs  clear Cardiac RR, without RMG Abdominal  soft, nontender, without masses, organomegaly or hernia Breasts  examined lying and sitting without masses, retractions, discharge or axillary adenopathy. Pelvic  Ext/BUS/vagina  Mild atrophic changes. White discharge noted.   Cervix  normal GC/Chlamydia  Uterus  axial, normal size, shape and contour, midline and mobile nontender   Adnexa  Without masses or tenderness    Anus and perineum  normal   Rectovaginal  normal sphincter tone without palpated masses or tenderness.    Assessment/Plan:  51 y.o. G6P6 postmenopausal female for follow up exam.   1. History of postmenopausal bleeding status post hysteroscopy D&C. She has done well since then and has no bleeding. We'll continue to monitor. 2. Vaginal discharge. Patient had unprotected sex after alcohol consumption does not remember the event. Was treated for recalcitrant UTI after that. Vaginal discharge noted today. Wet prep suspicious for low-level bacterial vaginosis. Treat with Flagyl 500 twice a day x7 days. Patient clearly understands and accepts the need to abstain from alcohol during this time as will make her sick. GC Chlamydia screen done. Recommended serum screening to include HIV hepatitis B hepatitis C and RPR. Patient declined said that she will have drawn through her primary's office if  she's making appointment to see for follow up for medical issues. 3. Pap smear. No Pap smear done today. Last Pap smear 2012.  No history of significant abnormalities. Plan 3 year interval follow up. 4. Mammography. Patient overdo and nose importance of scheduling and early detection. SBE monthly reviewed. 5. DEXA. Patient has not had yet and we will plan further into the 50s. 6. Colonoscopy. Patient has not scheduled. Patient knows importance of doing so and agrees to arrange. 7. Health maintenance. No blood work done this will be done through her primary physician's office who she is arranging to see. Follow up one year, sooner as needed.    Dara Lords MD, 12:11 PM 08/07/2012

## 2012-08-08 LAB — URINALYSIS W MICROSCOPIC + REFLEX CULTURE
Bacteria, UA: NONE SEEN
Casts: NONE SEEN
Hgb urine dipstick: NEGATIVE
Ketones, ur: NEGATIVE mg/dL
Nitrite: NEGATIVE
pH: 7 (ref 5.0–8.0)

## 2012-08-08 LAB — GC/CHLAMYDIA PROBE AMP, GENITAL
Chlamydia, DNA Probe: NEGATIVE
GC Probe Amp, Genital: NEGATIVE

## 2012-09-12 HISTORY — PX: COLONOSCOPY: SHX174

## 2012-10-03 ENCOUNTER — Encounter: Payer: Self-pay | Admitting: Gastroenterology

## 2012-10-09 ENCOUNTER — Ambulatory Visit (AMBULATORY_SURGERY_CENTER): Payer: Medicare Other | Admitting: *Deleted

## 2012-10-09 VITALS — Ht 62.0 in | Wt 139.4 lb

## 2012-10-09 DIAGNOSIS — Z1211 Encounter for screening for malignant neoplasm of colon: Secondary | ICD-10-CM

## 2012-10-09 MED ORDER — PEG-KCL-NACL-NASULF-NA ASC-C 100 G PO SOLR: ORAL | Status: DC

## 2012-10-09 NOTE — Progress Notes (Signed)
No allergy to eggs or soy products

## 2012-10-24 ENCOUNTER — Encounter: Payer: Medicare Other | Admitting: Gastroenterology

## 2012-10-29 ENCOUNTER — Telehealth: Payer: Self-pay | Admitting: *Deleted

## 2012-10-29 NOTE — Telephone Encounter (Signed)
Patient said that she cannot move her appointment because her daughter has to drive her and her daughter has doctor appointment that afternoon. Her daughter is her only ride.

## 2012-10-29 NOTE — Telephone Encounter (Signed)
Come in a little earlier pleasse

## 2012-10-29 NOTE — Telephone Encounter (Signed)
Dr. Jarold Motto needs patient moved to afternoon appointment for colonoscopy on Wed because he has a doctor appointment  Left message on patients voice mail for return call

## 2012-10-29 NOTE — Telephone Encounter (Signed)
Per Dr. Jarold Motto ok for patient to come in at 8 am.  ___________________________________________________________________  I called patient and asked if she would not mind moving her appointment to 8 am but has to be here at 7:30 am. Patient said she can do that time.  I advised patient that she will follow her directions on the prep instructions sheet for her procedure EXCEPT only change is she will have to drink second prep at 3 am on day of procedure because it is 5 hours before procedure time.  Advised patient that if she has any questions or cannot keep appointment time to please call the office back.  Patient verbalized understanding.  Natalie on fourth floor and Cathlyn Parsons was notified of change in schedule.

## 2012-10-31 ENCOUNTER — Encounter: Payer: Medicare Other | Admitting: Gastroenterology

## 2012-10-31 ENCOUNTER — Ambulatory Visit (AMBULATORY_SURGERY_CENTER): Payer: Medicare Other | Admitting: Gastroenterology

## 2012-10-31 ENCOUNTER — Encounter: Payer: Self-pay | Admitting: Gastroenterology

## 2012-10-31 VITALS — BP 140/85 | HR 56 | Temp 97.6°F | Resp 19 | Ht 62.0 in | Wt 139.0 lb

## 2012-10-31 DIAGNOSIS — K623 Rectal prolapse: Secondary | ICD-10-CM

## 2012-10-31 DIAGNOSIS — Z1211 Encounter for screening for malignant neoplasm of colon: Secondary | ICD-10-CM

## 2012-10-31 MED ORDER — SODIUM CHLORIDE 0.9 % IV SOLN: 500.0000 mL | INTRAVENOUS | Status: DC

## 2012-10-31 MED ORDER — MESALAMINE 1000 MG RE SUPP: 1000.0000 mg | Freq: Every day | RECTAL | Status: DC

## 2012-10-31 NOTE — Patient Instructions (Addendum)

## 2012-10-31 NOTE — Progress Notes (Signed)
Patient did not experience any of the following events: a burn prior to discharge; a fall within the facility; wrong site/side/patient/procedure/implant event; or a hospital transfer or hospital admission upon discharge from the facility. (G8907) Patient did not have preoperative order for IV antibiotic SSI prophylaxis. (G8918)  

## 2012-10-31 NOTE — Op Note (Signed)
[facility]  [address] [place], [address]   COLONOSCOPY PROCEDURE REPORT  PATIENT: [name], [name]  MR#: [id number] BIRTHDATE: 1961-01-09 , 8  yrs. old GENDER: Female ENDOSCOPIST: Sable Feil, MD, Marval Regal REFERRED BY:  Redge Gainer, M.D. PROCEDURE DATE:  10/31/2012 PROCEDURE:   Colonoscopy with biopsy ASA CLASS:   Class II INDICATIONS:Average risk patient for colon cancer. MEDICATIONS: propofol (Diprivan) 323m IV  DESCRIPTION OF PROCEDURE:   After the risks and benefits and of the procedure were explained, informed consent was obtained.        The LB CF-[id number] endoscope was introduced through the anus and advanced to the cecum, which was identified by both the appendix and ileocecal valve .  The quality of the prep was excellent, using MoviPrep .  The instrument was then slowly withdrawn as the colon was fully examined.     COLON FINDINGS: The colonoscope was inserted into the cecum.  The mucosa of the colon appeared unremarkable except for the last 15 cm where there are some nodular areas with scattered erosions. Biopsies were obtained as well pictures for documentation. Retroflex view of the rectum showed prominent internal hemorrhoids. The colonoscope was inserted into the cecum.  The mucosa of the colon appeared unremarkable except for the last 15 cm where there are some nodular areas with scattered erosions.  Biopsies were obtained as well pictures for documentation.  Retroflex view of the rectum showed prominent internal hemorrhoids.     Retroflexed views revealed internal hemorrhoids.     The scope was then withdrawn from the patient and the procedure completed.  COMPLICATIONS: There were no complications. ENDOSCOPIC IMPRESSION: 1.   The colonoscope was inserted into the cecum.  The mucosa of the colon appeared unremarkable except for the last 15 cm where there are some nodular areas with scattered erosions.  Biopsies  were obtained as well pictures for documentation.  Retroflex view of the rectum showed prominent internal hemorrhoids.  ?? IBD. RECOMMENDATIONS: 1.  Await biopsy results 2.   Canasa 1 gm supp qhs qns see me 2 weks   REPEAT EXAM:  cc:  _______________________________ eSigned:Sable Feil MD, FChippenham Ambulatory Surgery Center LLC02/19/2014 8:30 AM

## 2012-10-31 NOTE — Progress Notes (Signed)
Called to room to assist during endoscopic procedure.  Patient ID and intended procedure confirmed with present staff. Received instructions for my participation in the procedure from the performing physician.  

## 2012-11-01 ENCOUNTER — Telehealth: Payer: Self-pay

## 2012-11-01 NOTE — Telephone Encounter (Signed)
Left message on answering machine. 

## 2012-11-05 ENCOUNTER — Encounter: Payer: Self-pay | Admitting: Gastroenterology

## 2012-11-19 ENCOUNTER — Encounter: Payer: Self-pay | Admitting: Gastroenterology

## 2012-11-19 ENCOUNTER — Ambulatory Visit (INDEPENDENT_AMBULATORY_CARE_PROVIDER_SITE_OTHER): Payer: Medicare Other | Admitting: Gastroenterology

## 2012-11-19 ENCOUNTER — Other Ambulatory Visit (INDEPENDENT_AMBULATORY_CARE_PROVIDER_SITE_OTHER): Payer: Medicare Other

## 2012-11-19 VITALS — BP 140/90 | HR 62 | Ht 62.0 in | Wt 139.0 lb

## 2012-11-19 DIAGNOSIS — Z9109 Other allergy status, other than to drugs and biological substances: Secondary | ICD-10-CM

## 2012-11-19 DIAGNOSIS — R140 Abdominal distension (gaseous): Secondary | ICD-10-CM

## 2012-11-19 DIAGNOSIS — F1010 Alcohol abuse, uncomplicated: Secondary | ICD-10-CM

## 2012-11-19 DIAGNOSIS — J3081 Allergic rhinitis due to animal (cat) (dog) hair and dander: Secondary | ICD-10-CM

## 2012-11-19 DIAGNOSIS — K623 Rectal prolapse: Secondary | ICD-10-CM

## 2012-11-19 LAB — HEPATIC FUNCTION PANEL
ALT: 17 U/L (ref 0–35)
AST: 24 U/L (ref 0–37)
Albumin: 4 g/dL (ref 3.5–5.2)

## 2012-11-19 LAB — LIPASE: Lipase: 27 U/L (ref 11.0–59.0)

## 2012-11-19 LAB — CBC WITH DIFFERENTIAL/PLATELET
Basophils Absolute: 0.1 10*3/uL (ref 0.0–0.1)
Basophils Relative: 1 % (ref 0.0–3.0)
Eosinophils Absolute: 0.2 10*3/uL (ref 0.0–0.7)
Hemoglobin: 12.8 g/dL (ref 12.0–15.0)
Lymphs Abs: 2.6 10*3/uL (ref 0.7–4.0)
MCHC: 34 g/dL (ref 30.0–36.0)
MCV: 93.9 fl (ref 78.0–100.0)
Monocytes Absolute: 0.6 10*3/uL (ref 0.1–1.0)
Neutro Abs: 4.7 10*3/uL (ref 1.4–7.7)
RBC: 4.02 Mil/uL (ref 3.87–5.11)
RDW: 13.1 % (ref 11.5–14.6)

## 2012-11-19 LAB — IBC PANEL
Saturation Ratios: 37.4 % (ref 20.0–50.0)
Transferrin: 238.7 mg/dL (ref 212.0–360.0)

## 2012-11-19 LAB — COMPREHENSIVE METABOLIC PANEL
ALT: 17 U/L (ref 0–35)
Alkaline Phosphatase: 47 U/L (ref 39–117)
Glucose, Bld: 78 mg/dL (ref 70–99)
Sodium: 140 mEq/L (ref 135–145)
Total Bilirubin: 0.8 mg/dL (ref 0.3–1.2)
Total Protein: 6.7 g/dL (ref 6.0–8.3)

## 2012-11-19 LAB — AMYLASE: Amylase: 63 U/L (ref 27–131)

## 2012-11-19 MED ORDER — METHYLPREDNISOLONE 4 MG PO KIT: PACK | ORAL | Status: DC

## 2012-11-19 NOTE — Patient Instructions (Addendum)
Your physician has requested that you go to the basement for the following lab work before leaving today: CDT, Anemia Panel, BMP, Liver function test, CBC, TSH, Lipase, Amylase, Rheumatoid Factor and Celiac Panel.  Please follow FOD Map diet given today.  We sent Medrol, Prednisone pack for your bed bug bites. ___________________________________________________________________________________________________________________  [name] have been scheduled for an abdominal ultrasound at [facility] (1st floor of hospital) on Wednesday 11-21-2012 at 9:30 AM. Please arrive 15 minutes prior to your appointment for registration. Make certain not to have anything to eat or drink 6 hours prior to your appointment. Should you need to reschedule your appointment, please contact radiology at [phone]. This test typically takes about 30 minutes to perform.

## 2012-11-19 NOTE — Progress Notes (Signed)
History of Present Illness:  This is a interesting 52 year old Caucasian female who recently had screening colonoscopy which revealed mild proctitis .  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  I have not seen her previously as a patient, and she describes chronic functional constipation since childhood when her mother gave her a daily enema.  She continues with mild constipation, gas, bloating, and excessive flatus.  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  Throughout our exam today the patient wore sunglasses.  She relates that she's had chronic acid reflux for many years and takes when necessary and masses.  She is a chronic alcoholic and apparently the past as had alcoholic hepatitis and pancreatitis.  She continues to drink heavily.  Patient also allegedly at age 45 had a CVA and is not on anticoagulant therapy.  She has a daughter who allegedly has celiac disease.  The patient denies symptoms of malabsorption, fever, chills, or hematochezia.  At the time of her colonoscopy she was placed on Canasa 1 g suppositories at bedtime.  She has not noticed any improvement with this medication.  She specifically denies lactose intolerance or any history of excessive use of sorbitol or fructose.  Her primary care physician is Dr. Redge Gainer.   I have reviewed this patient's present history, medical and surgical past history, allergies and medications.     ROS:   All systems were reviewed and are negative unless otherwise stated in the HPI.    Physical Exam: At pressure 140/90, pulse 62 and regular, and weight 139 with a BMI of 25.42.  I cannot appreciate stigmata of chronic liver disease.  She has marked facial edema and swelling with almost complete closing of eyes bilaterally from edema and erythema. General well developed well nourished patient in no acute distress, appearing their stated  age Eyes PERRLA, no icterus, fundoscopic exam per opthamologist Skin no lesions noted Neck supple, no adenopathy, no thyroid enlargement, no tenderness Chest clear to percussion and auscultation Heart no significant murmurs, gallops or rubs noted Abdomen no hepatosplenomegaly masses or tenderness, BS normal.  Extremities no acute joint lesions, edema, phlebitis or evidence of cellulitis. Neurologic patient oriented x 3, cranial nerves intact, no focal neurologic deficits noted. Psychological mental status normal and normal affect.  Assessment and plan: This patient has exposure to bed bugs and appears to be having a rather severe topical facial dermatitis for which I prescribed a Medrol Dosepak.  She has chronic functional constipation with mucosal prolapse syndrome, and I've advised fiber as tolerated with liberal by mouth fluids and daily stool softeners.  We will check her for celiac disease, and I placed her on a FOD-MAP diet as a therapeutic from.  I've also CBC, amylase, lipase, liver function tests.  I suspect she has chronic underlying alcoholic liver disease, and perhaps chronic pancreatitis with chronic low-grade malabsorption.  Currently she does not appear to be very symptomatic from her rectal prolapse syndrome.  I will see her back in followup in several weeks' time.  Also have ordered upper abdominal ultrasound for review. Encounter Diagnosis  Name Primary?  . Alcohol abuse Yes

## 2012-11-20 LAB — CELIAC PANEL 10
Tissue Transglut Ab: 10.1 U/mL (ref <20)
Tissue Transglutaminase Ab, IgA: 3.3 U/mL (ref <20)

## 2012-11-20 LAB — RHEUMATOID FACTOR: Rhuematoid fact SerPl-aCnc: <10 IU/mL (ref <=14)

## 2012-11-21 ENCOUNTER — Ambulatory Visit (HOSPITAL_COMMUNITY)
Admission: RE | Admit: 2012-11-21 | Discharge: 2012-11-21 | Disposition: A | Discharge status: Home / Self Care | Payer: Medicare Other | Source: Ambulatory Visit | Attending: Gastroenterology | Admitting: Gastroenterology

## 2012-11-21 DIAGNOSIS — K7689 Other specified diseases of liver: Secondary | ICD-10-CM | POA: Insufficient documentation

## 2012-11-21 DIAGNOSIS — R109 Unspecified abdominal pain: Secondary | ICD-10-CM | POA: Insufficient documentation

## 2012-11-21 DIAGNOSIS — F1010 Alcohol abuse, uncomplicated: Secondary | ICD-10-CM | POA: Insufficient documentation

## 2012-11-21 LAB — CARBOHYDRATE DEFICIENT TRANSFERRIN
%CDT: 1.5 % (ref <2.5)
CDT: 33.1 mg/L (ref 28.1–76.0)
Transferrin: 225 mg/dL (ref 188–341)

## 2013-04-09 ENCOUNTER — Ambulatory Visit (INDEPENDENT_AMBULATORY_CARE_PROVIDER_SITE_OTHER): Payer: Medicare Other | Admitting: Family Medicine

## 2013-04-09 ENCOUNTER — Encounter: Payer: Self-pay | Admitting: Family Medicine

## 2013-04-09 VITALS — BP 126/72 | HR 78 | Temp 97.9°F | Resp 16 | Ht 62.0 in | Wt 136.0 lb

## 2013-04-09 DIAGNOSIS — Z0000 Encounter for general adult medical examination without abnormal findings: Secondary | ICD-10-CM

## 2013-04-09 MED ORDER — VENLAFAXINE HCL ER 75 MG PO CP24: 75.0000 mg | ORAL_CAPSULE | Freq: Every day | ORAL | Status: DC

## 2013-04-09 NOTE — Progress Notes (Signed)
Subjective:    Patient ID: [name], female    DOB: [birth year], 52 y.o.   MRN: [id number]  HPI Patient is here today for complete physical exam. She had a Pap smear performed in the winter of 2013 at her gynecologist.  She is overdue for her mammogram. She had a colonoscopy this year which revealed rectal prolapse syndrome and no evidence of cancer or inflammatory bowel disease. Her immunizations are up-to-date. One medical concern is hot flashes. They occur on a daily basis. She has tried black cohosh without relief. She is interested in hormonal therapy. She does have a history of a CVA at the age of 31. We have no records to accompany this report. She has a allergy to aspirin. Past Medical History  Diagnosis Date  . MRSA (methicillin resistant Staphylococcus aureus) carrier   . CA - skin cancer   . Crush injury of hand RIGHT IN 2007    X 6 SURG'S  LAST ONE BEING TOTAL WRIST FUSION W/ BONE GRAFT AND PLATE  . Chronic pain due to injury RIGHT HAND/WRIST IN 2007  . Depression   . PTSD (post-traumatic stress disorder) 2007 RIGHT HAND CRUSH INJURY W/ 2 FINGER AMPUTATION  . Stroke X2 CVA'S  IN 1990    LOSS OF VISION RIGHT EYE  . Weakness of right leg CAUSED BY RETRIEVAL THIGH MUSCLE FOR FLAP RIGHT HAND INJURY-- 2006    RIGHT LEG GIVES OUT OCCASIONALLY  . Anxiety     BILATERAL HANDS-- LEFT >RIGHT AND LEGS  . GERD (gastroesophageal reflux disease)     TAKES TUMS PRN  . Asthma     NO INHALER  . Arthritis   . Neuromuscular disorder     some fibromyalgia  . Blood transfusion without reported diagnosis   . Substance abuse     hx: narcotic use  . Hemorrhoids   . Bed bug bite    Past Surgical History  Procedure Laterality Date  . Skin cancer removal  2007    throat area  . Right total wrist fusion w/ bone graft and plate  [id number]    AND FLAP USING RIGHT GOIN MUSCLE  . Hand carpectomy  12-24-2005    RIGHT-- INCLUDING  REMOVAL  WRIST WIRES AND I &D (EXICIOSNAL)  FOR OSTEOMYELITIS   . Orif radial fracture  09-04-05    DISTAL RADIAL PLATE AND PARTIAL THUMB AMPUTATION  . Incision / drainage hand / finger  X3  (2006 & 2007)    RIGHT CRUSH WOUND  . Orif carpal bone fracture  08-31-05    I & D OF RIGHT HAND CRUSH INJURY/ OPEN FX'S AND INDEX FINGER RAY RESECTION/ COMPLICATED CLOSURE  . Tubal ligation  15 YRS AGO  . Hysteroscopy w/d&c  08/18/2011    Procedure: DILATATION AND CURETTAGE (D&C) /HYSTEROSCOPY;  Surgeon: Anastasio Auerbach, MD;  Location: [facility];  Service: Gynecology;  Laterality: N/A;  . Lesion removal  08/18/2011    Procedure: EXCISION VAGINAL LESION;  Surgeon: Anastasio Auerbach, MD;  Location: [facility];  Service: Gynecology;  Laterality: N/A;  . Hysteroscopy  12.6.2012    w/removal of polyp  . Colon surgery      15 years ago- fistula on colon removed   No current outpatient prescriptions on file prior to visit.   No current facility-administered medications on file prior to visit.   Allergies  Allergen Reactions  . Aspirin Shortness Of Breath    ASTHMATIC ATTACK  . Codeine Other (See  Comments)    ABNORMAL BEHAVIOR  Increased in bad moods    History   Social History  . Marital Status: Single    Spouse Name: N/A    Number of Children: 6  . Years of Education: N/A   Occupational History  . Housekeeper     Social History Main Topics  . Smoking status: Former Smoker -- 1.50 packs/day for 28 years    Types: Cigarettes    Quit date: 11/29/2001  . Smokeless tobacco: Never Used  . Alcohol Use: 0.0 oz/week     Comment: 4 drinks a week  . Drug Use: Yes    Special: Marijuana     Comment: 10-09-12- pt has quit  . Sexually Active: Not Currently -- Female partner(s)    Birth Control/ Protection: Post-menopausal   Other Topics Concern  . Not on file   Social History Narrative  . No narrative on file   Family History  Problem Relation Age of Onset  . Lupus Mother     died at 60  . Breast cancer Maternal Aunt    . Breast cancer Maternal Grandmother   . Stomach cancer Paternal Grandfather   . Colon cancer Neg Hx   . Rectal cancer Neg Hx   . Esophageal cancer Neg Hx       Review of Systems  All other systems reviewed and are negative.       Objective:   Physical Exam  Vitals reviewed. Constitutional: She is oriented to person, place, and time. She appears well-developed and well-nourished. No distress.  HENT:  Head: Normocephalic and atraumatic.  Right Ear: External ear normal.  Left Ear: External ear normal.  Nose: Nose normal.  Mouth/Throat: Oropharynx is clear and moist. No oropharyngeal exudate.  Eyes: Conjunctivae and EOM are normal. Pupils are equal, round, and reactive to light. Right eye exhibits no discharge. Left eye exhibits no discharge. No scleral icterus.  Neck: Normal range of motion. Neck supple. No JVD present. No thyromegaly present.  Cardiovascular: Normal rate, regular rhythm, normal heart sounds and intact distal pulses.  Exam reveals no gallop and no friction rub.   No murmur heard. Pulmonary/Chest: Effort normal and breath sounds normal. No respiratory distress. She has no wheezes. She has no rales. She exhibits no tenderness.  Abdominal: Soft. Bowel sounds are normal. She exhibits no distension and no mass. There is no tenderness. There is no rebound and no guarding.  Musculoskeletal: Normal range of motion. She exhibits no edema and no tenderness.  Lymphadenopathy:    She has no cervical adenopathy.  Neurological: She is alert and oriented to person, place, and time. She has normal reflexes. She displays normal reflexes. No cranial nerve deficit. She exhibits normal muscle tone. Coordination normal.  Skin: Skin is warm. No rash noted. She is not diaphoretic. No erythema. No pallor.  Psychiatric: She has a normal mood and affect. Her behavior is normal. Judgment and thought content normal.   Patient's breast exam is normal.  Her physical exam is significant for  a crush injury to the right upper extremity. She has postsurgical changes in the right hand and forearm. She is missing the first and second digit on her right hand.       Assessment & Plan:  1. Routine general medical examination at a health care facility Patient's physical exam is within normal limits. I recommended discontinuation of marijuana use. I recommended the patient get mammogram this year and I will schedule that. Her colonoscopy is up-to-date.  Her Pap smear is up-to-date. Asked her to return fasting for a CBC, CMP, lipid panel, and thyroid test. To complete her hot flashes, given her history of CVA, I recommended against hormone replacement. Therefore, we are going to try Effexor XR 75 mg by mouth daily and reassess in 3 weeks. - CBC with Differential; Future - COMPLETE METABOLIC PANEL WITH GFR; Future - Lipid panel; Future - TSH; Future - MM Digital Screening; Future

## 2013-04-10 ENCOUNTER — Other Ambulatory Visit: Payer: Medicare Other

## 2013-04-10 DIAGNOSIS — Z0000 Encounter for general adult medical examination without abnormal findings: Secondary | ICD-10-CM

## 2013-04-10 LAB — CBC WITH DIFFERENTIAL/PLATELET
Basophils Relative: 1 % (ref 0–1)
Eosinophils Relative: 4 % (ref 0–5)
HCT: 39.6 % (ref 36.0–46.0)
Hemoglobin: 13.4 g/dL (ref 12.0–15.0)
MCH: 31.8 pg (ref 26.0–34.0)
MCHC: 33.8 g/dL (ref 30.0–36.0)
MCV: 94.1 fL (ref 78.0–100.0)
Monocytes Absolute: 0.6 10*3/uL (ref 0.1–1.0)
Monocytes Relative: 8 % (ref 3–12)
Neutro Abs: 4.7 10*3/uL (ref 1.7–7.7)

## 2013-04-10 LAB — COMPLETE METABOLIC PANEL WITH GFR
Albumin: 4.8 g/dL (ref 3.5–5.2)
Alkaline Phosphatase: 55 U/L (ref 39–117)
BUN: 12 mg/dL (ref 6–23)
Calcium: 9.9 mg/dL (ref 8.4–10.5)
GFR, Est Non African American: >89 mL/min
Glucose, Bld: 92 mg/dL (ref 70–99)
Potassium: 4.8 mEq/L (ref 3.5–5.3)

## 2013-04-10 LAB — LIPID PANEL
Cholesterol: 261 mg/dL — ABNORMAL HIGH (ref 0–200)
Total CHOL/HDL Ratio: 3.7 Ratio

## 2013-04-11 ENCOUNTER — Other Ambulatory Visit: Payer: Self-pay | Admitting: Family Medicine

## 2013-04-11 MED ORDER — ATORVASTATIN CALCIUM 40 MG PO TABS: 40.0000 mg | ORAL_TABLET | Freq: Every day | ORAL | Status: DC

## 2013-04-11 NOTE — Telephone Encounter (Signed)
med sent to pharm 

## 2013-04-26 ENCOUNTER — Ambulatory Visit: Payer: Medicare Other

## 2013-05-03 ENCOUNTER — Ambulatory Visit: Payer: Medicare Other

## 2013-05-07 ENCOUNTER — Telehealth: Payer: Self-pay | Admitting: Family Medicine

## 2013-05-07 MED ORDER — ATORVASTATIN CALCIUM 40 MG PO TABS: 40.0000 mg | ORAL_TABLET | Freq: Every day | ORAL | Status: DC

## 2013-05-07 MED ORDER — VENLAFAXINE HCL ER 75 MG PO CP24: 75.0000 mg | ORAL_CAPSULE | Freq: Every day | ORAL | Status: DC

## 2013-05-07 NOTE — Telephone Encounter (Signed)
Venlafaxine HCL ER 75 mg cap 1 QD #90 Atorvastatin 40 mg tab 1 QD #90

## 2013-05-08 ENCOUNTER — Encounter: Payer: Self-pay | Admitting: *Deleted

## 2013-05-30 ENCOUNTER — Ambulatory Visit
Admission: RE | Admit: 2013-05-30 | Discharge: 2013-05-30 | Disposition: A | Discharge status: Home / Self Care | Payer: Medicare Other | Source: Ambulatory Visit | Attending: Family Medicine | Admitting: Family Medicine

## 2013-05-30 DIAGNOSIS — Z0000 Encounter for general adult medical examination without abnormal findings: Secondary | ICD-10-CM

## 2013-07-15 ENCOUNTER — Ambulatory Visit (INDEPENDENT_AMBULATORY_CARE_PROVIDER_SITE_OTHER): Payer: Medicare Other | Admitting: Family Medicine

## 2013-07-15 DIAGNOSIS — Z23 Encounter for immunization: Secondary | ICD-10-CM

## 2013-08-20 ENCOUNTER — Ambulatory Visit: Payer: Medicare Other | Admitting: Family Medicine

## 2013-08-20 DIAGNOSIS — Z7189 Other specified counseling: Secondary | ICD-10-CM

## 2013-08-20 NOTE — Progress Notes (Signed)
Patient ID: [name], female   DOB: 08/18/1961, 52 y.o.   MRN: [id number] Pt came for Zostavax and TDAP vaccinations.  Neither were is stock.  Needed to get today.  Both called in to [facility] for her to receive there per Dr Dennard Schaumann.

## 2013-10-04 ENCOUNTER — Encounter: Payer: Self-pay | Admitting: Family Medicine

## 2013-10-04 ENCOUNTER — Ambulatory Visit (INDEPENDENT_AMBULATORY_CARE_PROVIDER_SITE_OTHER): Payer: Medicare Other | Admitting: Family Medicine

## 2013-10-04 VITALS — BP 110/60 | HR 78 | Temp 97.9°F | Resp 20 | Ht 62.0 in | Wt 136.0 lb

## 2013-10-04 DIAGNOSIS — S61219A Laceration without foreign body of unspecified finger without damage to nail, initial encounter: Secondary | ICD-10-CM

## 2013-10-04 DIAGNOSIS — S61209A Unspecified open wound of unspecified finger without damage to nail, initial encounter: Secondary | ICD-10-CM

## 2013-10-04 MED ORDER — CEPHALEXIN 500 MG PO CAPS: 500.0000 mg | ORAL_CAPSULE | Freq: Two times a day (BID) | ORAL | Status: DC

## 2013-10-04 MED ORDER — OXYCODONE-ACETAMINOPHEN 5-325 MG PO TABS: 1.0000 | ORAL_TABLET | Freq: Four times a day (QID) | ORAL | Status: DC | PRN

## 2013-10-04 NOTE — Patient Instructions (Addendum)
F/U 1 week for suture removal  Laceration Care, Adult A laceration is a cut or lesion that goes through all layers of the skin and into the tissue just beneath the skin. TREATMENT  Some lacerations may not require closure. Some lacerations may not be able to be closed due to an increased risk of infection. It is important to see your caregiver as soon as possible after an injury to minimize the risk of infection and maximize the opportunity for successful closure. If closure is appropriate, pain medicines may be given, if needed. The wound will be cleaned to help prevent infection. Your caregiver will use stitches (sutures), staples, wound glue (adhesive), or skin adhesive strips to repair the laceration. These tools bring the skin edges together to allow for faster healing and a better cosmetic outcome. However, all wounds will heal with a scar. Once the wound has healed, scarring can be minimized by covering the wound with sunscreen during the day for 1 full year. HOME CARE INSTRUCTIONS  For sutures or staples:  Keep the wound clean and dry.  If you were given a bandage (dressing), you should change it at least once a day. Also, change the dressing if it becomes wet or dirty, or as directed by your caregiver.  Wash the wound with soap and water 2 times a day. Rinse the wound off with water to remove all soap. Pat the wound dry with a clean towel.  After cleaning, apply a thin layer of the antibiotic ointment as recommended by your caregiver. This will help prevent infection and keep the dressing from sticking.  You may shower as usual after the first 24 hours. Do not soak the wound in water until the sutures are removed.  Only take over-the-counter or prescription medicines for pain, discomfort, or fever as directed by your caregiver.  Get your sutures or staples removed as directed by your caregiver. For skin adhesive strips:  Keep the wound clean and dry.  Do not get the skin  adhesive strips wet. You may bathe carefully, using caution to keep the wound dry.  If the wound gets wet, pat it dry with a clean towel.  Skin adhesive strips will fall off on their own. You may trim the strips as the wound heals. Do not remove skin adhesive strips that are still stuck to the wound. They will fall off in time. For wound adhesive:  You may briefly wet your wound in the shower or bath. Do not soak or scrub the wound. Do not swim. Avoid periods of heavy perspiration until the skin adhesive has fallen off on its own. After showering or bathing, gently pat the wound dry with a clean towel.  Do not apply liquid medicine, cream medicine, or ointment medicine to your wound while the skin adhesive is in place. This may loosen the film before your wound is healed.  If a dressing is placed over the wound, be careful not to apply tape directly over the skin adhesive. This may cause the adhesive to be pulled off before the wound is healed.  Avoid prolonged exposure to sunlight or tanning lamps while the skin adhesive is in place. Exposure to ultraviolet light in the first year will darken the scar.  The skin adhesive will usually remain in place for 5 to 10 days, then naturally fall off the skin. Do not pick at the adhesive film. You may need a tetanus shot if:  You cannot remember when you had your last tetanus shot.  You have never had a tetanus shot. If you get a tetanus shot, your arm may swell, get red, and feel warm to the touch. This is common and not a problem. If you need a tetanus shot and you choose not to have one, there is a rare chance of getting tetanus. Sickness from tetanus can be serious. SEEK MEDICAL CARE IF:   You have redness, swelling, or increasing pain in the wound.  You see a red line that goes away from the wound.  You have yellowish-white fluid (pus) coming from the wound.  You have a fever.  You notice a bad smell coming from the wound or  dressing.  Your wound breaks open before or after sutures have been removed.  You notice something coming out of the wound such as wood or glass.  Your wound is on your hand or foot and you cannot move a finger or toe. SEEK IMMEDIATE MEDICAL CARE IF:   Your pain is not controlled with prescribed medicine.  You have severe swelling around the wound causing pain and numbness or a change in color in your arm, hand, leg, or foot.  Your wound splits open and starts bleeding.  You have worsening numbness, weakness, or loss of function of any joint around or beyond the wound.  You develop painful lumps near the wound or on the skin anywhere on your body. MAKE SURE YOU:   Understand these instructions.  Will watch your condition.  Will get help right away if you are not doing well or get worse. Document Released: 08/29/2005 Document Revised: 11/21/2011 Document Reviewed: 02/22/2011 [facility] Patient Information 2014 [place], [place].

## 2013-10-06 DIAGNOSIS — S61219A Laceration without foreign body of unspecified finger without damage to nail, initial encounter: Secondary | ICD-10-CM | POA: Insufficient documentation

## 2013-10-06 NOTE — Assessment & Plan Note (Signed)
TDAP UTD With open wound overnight and redness, cover with Keflex Pain meds given RTC 7 days for suture removal

## 2013-10-06 NOTE — Progress Notes (Signed)
   Subjective:    Patient ID: [name], female    DOB: 02/20/1961, 53 y.o.   MRN: [id number]  HPI Patient here with laceration to her right hand index finger. She does not have a thumb secondary to a crush injury many years ago resulted in multiple reconstructive surgeries. She was cooking last night when she sliced the tip of her index finger. She did not think was that the therefore wrapped in a bandage and went to sleep but she noticed more throbbing and pain from the finger this morning and came in to have this cleaned up.  +TDAP 3 months ago   Review of Systems - per above  GEN- +injury, no fever      Objective:   Physical Exam  GEN-NAD,alert and oriented x 3 MKS- index finger- tendons in tact, from ROM at DIP, MIP Skin- Index finger - small laceration 2cm long, nail in tact, +bleeding from lesions, mild erythema, no pus      Assessment & Plan:    Laceration repair  - Procedure explained to patient questions answered benefits and risks discussed verbal consent obtained. Antiseptic-Betadine Anesthesia-lidocaine  4 sutures- with 4-0 Ethilon Minimal blood loss Patient tolerated procedure well Bandage applied

## 2013-10-11 ENCOUNTER — Ambulatory Visit (INDEPENDENT_AMBULATORY_CARE_PROVIDER_SITE_OTHER): Payer: Medicare Other | Admitting: Family Medicine

## 2013-10-11 VITALS — BP 100/70 | HR 78 | Temp 97.5°F | Resp 18 | Ht 62.0 in | Wt 136.0 lb

## 2013-10-11 DIAGNOSIS — S61219A Laceration without foreign body of unspecified finger without damage to nail, initial encounter: Secondary | ICD-10-CM

## 2013-10-11 DIAGNOSIS — S61209A Unspecified open wound of unspecified finger without damage to nail, initial encounter: Secondary | ICD-10-CM

## 2013-10-11 NOTE — Progress Notes (Signed)
   Subjective:    Patient ID: [name], female    DOB: [birth year], 53 y.o.   MRN: [id number]  HPI Patient here for suture removal. She states 2-3 days after suturing she popped 2 sutures. The redness and swelling has gone down and she has completed antibiotics. She only required pain medication the first day. She's not had any drainage but she'll he has one suture left in.  She did note that she put one of her old dressings that she had at home from her previous surgeries on the finger when the sutures popped  Review of Systems     Objective:   Physical Exam   Skin- Index finger - small laceration 1cm opening near tip of finger, no bleeding from lesions,no erythema, no pus, no swelling, other part of laceration is closed   Steri strips applied to remaining opening, bandage applied       Assessment & Plan:

## 2013-10-11 NOTE — Assessment & Plan Note (Signed)
Steri-Strips applied it is very superficial opening I think that this will close up over the next week which is the Steri-Strips applying pressure. She'll return if she has any difficulty

## 2013-10-11 NOTE — Patient Instructions (Signed)
Return if no improvement.

## 2013-11-01 ENCOUNTER — Encounter: Payer: Self-pay | Admitting: Family Medicine

## 2013-11-01 ENCOUNTER — Other Ambulatory Visit: Payer: Self-pay | Admitting: Family Medicine

## 2013-11-01 NOTE — Telephone Encounter (Signed)
Pt was to return for 3 week and 3 month follow up.  Both are way over due. Medication refill for one time only.  Patient needs to be seen.  Letter sent for patient to call and schedule

## 2013-11-08 ENCOUNTER — Ambulatory Visit: Payer: Medicare Other | Admitting: Family Medicine

## 2013-11-15 ENCOUNTER — Encounter: Payer: Self-pay | Admitting: Family Medicine

## 2013-11-15 ENCOUNTER — Ambulatory Visit (INDEPENDENT_AMBULATORY_CARE_PROVIDER_SITE_OTHER): Payer: Medicare Other | Admitting: Family Medicine

## 2013-11-15 VITALS — BP 128/80 | HR 72 | Temp 97.2°F | Resp 16 | Ht 62.0 in | Wt 139.0 lb

## 2013-11-15 DIAGNOSIS — E785 Hyperlipidemia, unspecified: Secondary | ICD-10-CM

## 2013-11-15 DIAGNOSIS — D485 Neoplasm of uncertain behavior of skin: Secondary | ICD-10-CM

## 2013-11-15 LAB — COMPLETE METABOLIC PANEL WITH GFR
ALK PHOS: 52 U/L (ref 39–117)
ALT: 39 U/L — AB (ref 0–35)
AST: 34 U/L (ref 0–37)
Albumin: 4.5 g/dL (ref 3.5–5.2)
BILIRUBIN TOTAL: 0.7 mg/dL (ref 0.2–1.2)
BUN: 16 mg/dL (ref 6–23)
CALCIUM: 9.6 mg/dL (ref 8.4–10.5)
CHLORIDE: 104 meq/L (ref 96–112)
CO2: 27 mEq/L (ref 19–32)
CREATININE: 0.79 mg/dL (ref 0.50–1.10)
GFR, Est African American: >89 mL/min
GFR, Est Non African American: 85 mL/min
Glucose, Bld: 79 mg/dL (ref 70–99)
Potassium: 4.9 mEq/L (ref 3.5–5.3)
Sodium: 143 mEq/L (ref 135–145)
Total Protein: 6.6 g/dL (ref 6.0–8.3)

## 2013-11-15 LAB — LIPID PANEL
CHOL/HDL RATIO: 2.6 ratio
CHOLESTEROL: 152 mg/dL (ref 0–200)
HDL: 59 mg/dL (ref >39)
LDL CALC: 69 mg/dL (ref 0–99)
TRIGLYCERIDES: 121 mg/dL (ref <150)
VLDL: 24 mg/dL (ref 0–40)

## 2013-11-15 NOTE — Progress Notes (Signed)
Subjective:    Patient ID: [name], female    DOB: February 18, 1961, 53 y.o.   MRN: [id number]  HPI Patient has a history of hyperlipidemia. She is overdue for a CMP and fasting lipid panel. She is current taking Lipitor 40 mg by mouth daily. She is also concerned about several lesions on her face. She has one large 1 cm x 2 cm erythematous plaque on her left cheek with some fine white scale. She also has 2, 5 mm erythematous scaly papules on her left temple and one 3 mm erythematous macule on her nose. Of these appear to be actinic keratosis versus squamous cell carcinomas. Past Medical History  Diagnosis Date  . MRSA (methicillin resistant Staphylococcus aureus) carrier   . CA - skin cancer   . Crush injury of hand RIGHT IN 2007    X 6 SURG'S  LAST ONE BEING TOTAL WRIST FUSION W/ BONE GRAFT AND PLATE  . Chronic pain due to injury RIGHT HAND/WRIST IN 2007  . Depression   . PTSD (post-traumatic stress disorder) 2007 RIGHT HAND CRUSH INJURY W/ 2 FINGER AMPUTATION  . Stroke X2 CVA'S  IN 1990    LOSS OF VISION RIGHT EYE  . Weakness of right leg CAUSED BY RETRIEVAL THIGH MUSCLE FOR FLAP RIGHT HAND INJURY-- 2006    RIGHT LEG GIVES OUT OCCASIONALLY  . Anxiety     BILATERAL HANDS-- LEFT >RIGHT AND LEGS  . GERD (gastroesophageal reflux disease)     TAKES TUMS PRN  . Asthma     NO INHALER  . Arthritis   . Neuromuscular disorder     some fibromyalgia  . Blood transfusion without reported diagnosis   . Substance abuse     hx: narcotic use  . Hemorrhoids   . Bed bug bite    Current Outpatient Prescriptions on File Prior to Visit  Medication Sig Dispense Refill  . atorvastatin (LIPITOR) 40 MG tablet TAKE 1 TABLET BY MOUTH DAILY  30 tablet  0   No current facility-administered medications on file prior to visit.   Allergies  Allergen Reactions  . Aspirin Shortness Of Breath    ASTHMATIC ATTACK  . Codeine Other (See Comments)    ABNORMAL BEHAVIOR  Increased in bad moods     History   Social History  . Marital Status: Single    Spouse Name: N/A    Number of Children: 6  . Years of Education: N/A   Occupational History  . Housekeeper     Social History Main Topics  . Smoking status: Former Smoker -- 1.50 packs/day for 28 years    Types: Cigarettes    Quit date: 11/29/2001  . Smokeless tobacco: Never Used  . Alcohol Use: 0.0 oz/week     Comment: 4 drinks a week  . Drug Use: Yes    Special: Marijuana     Comment: 10-09-12- pt has quit  . Sexual Activity: Not Currently    Partners: Male    Birth Control/ Protection: Post-menopausal   Other Topics Concern  . Not on file   Social History Narrative  . No narrative on file      Review of Systems  All other systems reviewed and are negative.       Objective:   Physical Exam  Vitals reviewed. Cardiovascular: Normal rate, regular rhythm and normal heart sounds.   Pulmonary/Chest: Effort normal and breath sounds normal.  Abdominal: Soft. Bowel sounds are normal.  Skin: There is erythema.   4  skin lesions as described in history of present illness        Assessment & Plan:  1. HLD (hyperlipidemia) Check a CMP and fasting lipid panel. Goal LDL is less than 100 and - COMPLETE METABOLIC PANEL WITH GFR - Lipid panel  2. Neoplasm of uncertain behavior of skin I treated all four lesions with liquid nitrogen. They received cryotherapy for a total of 25 seconds each. The patient was warned that this could leave a scar. She is willing to accept that because she does not want to see a dermatologist at the present time. I am concerned that the lesion on her left cheek may require surgical excision. If the lesion does not resolve after cryotherapy I recommended that she return for biopsy or Derm consult.

## 2013-11-27 ENCOUNTER — Ambulatory Visit (INDEPENDENT_AMBULATORY_CARE_PROVIDER_SITE_OTHER): Payer: Medicare Other | Admitting: Physician Assistant

## 2013-11-27 ENCOUNTER — Encounter: Payer: Self-pay | Admitting: Physician Assistant

## 2013-11-27 VITALS — BP 124/84 | HR 84 | Temp 101.2°F | Resp 20 | Ht 62.5 in | Wt 140.0 lb

## 2013-11-27 DIAGNOSIS — R509 Fever, unspecified: Secondary | ICD-10-CM

## 2013-11-27 DIAGNOSIS — A499 Bacterial infection, unspecified: Secondary | ICD-10-CM

## 2013-11-27 DIAGNOSIS — B9689 Other specified bacterial agents as the cause of diseases classified elsewhere: Secondary | ICD-10-CM

## 2013-11-27 DIAGNOSIS — J988 Other specified respiratory disorders: Secondary | ICD-10-CM

## 2013-11-27 LAB — INFLUENZA A AND B
INFLUENZA B AG: NEGATIVE
Inflenza A Ag: NEGATIVE

## 2013-11-27 MED ORDER — AZITHROMYCIN 250 MG PO TABS: ORAL_TABLET | ORAL | Status: DC

## 2013-11-27 NOTE — Progress Notes (Signed)
Patient ID: [name] MRN: [id number], DOB: May 30, 1961, 53 y.o. Date of Encounter: 11/27/2013, 12:15 PM    Chief Complaint:  Chief Complaint  Patient presents with  . sick    head/chest congestion, hurts to cough, fever 101 at home, boddy aches     HPI: 53 y.o. year old white female that she started getting sick on the afternoon of Monday which was 11/25/13.  Says she got much worse last night. Says when she got up this morning she "felt like she had been hit by a truck". At 5:30 this morning had fever of 101. Had no fever prior to then. Says that she has a lot of pressure in her head and her head is just pounding. Says that she is getting very little mucus out of her nose. Has some cough and chest congestion but is unable to get out any phlegm. Throat has been a little scratchy but no sore throat. No ear ache. Says she works at [employer] and a lot of people have been sick. 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     Home Meds: See attached medication section for any medications that were entered at today's visit. The computer does not put those onto this list.The following list is a list of meds entered prior to today's visit.   Current Outpatient Prescriptions on File Prior to Visit  Medication Sig Dispense Refill  . atorvastatin (LIPITOR) 40 MG tablet TAKE 1 TABLET BY MOUTH DAILY  30 tablet  0   No current facility-administered medications on file prior to visit.    Allergies:  Allergies  Allergen Reactions  . Aspirin Shortness Of Breath    ASTHMATIC ATTACK  . Codeine Other (See Comments)    ABNORMAL BEHAVIOR  Increased in bad moods       Review of Systems: See HPI for pertinent ROS. All other ROS negative.    Physical Exam: Blood pressure 124/84, pulse 84, temperature 101.2 F (38.4 C), temperature source Oral, resp. rate 20, height 5' 2.5" (1.588 m), weight 140 lb (63.504  kg)., Body mass index is 25.18 kg/(m^2). General:  WNWD WF. Appears in no acute distress. HEENT: Normocephalic, atraumatic, eyes without discharge, sclera non-icteric, nares are without discharge. Bilateral auditory canals clear, TM's are without perforation, pearly grey and translucent with reflective cone of light bilaterally. Oral cavity moist, posterior pharynx without exudate, erythema, peritonsillar abscess. No tenderness with percussion of frontal or maxillary sinuses bilaterally.  Neck: Supple. No thyromegaly. No lymphadenopathy. Lungs: Clear bilaterally to auscultation without wheezes, rales, or rhonchi. Breathing is unlabored. Heart: Regular rhythm. No murmurs, rubs, or gallops. Msk:  Strength and tone normal for age. Extremities/Skin: Warm and dry.. No rashes . Neuro: Alert and oriented X 3. Moves all extremities spontaneously. Gait is normal. CNII-XII grossly in tact. Psych:  Responds to questions appropriately with a normal affect.   Results for orders placed in visit on 11/27/13  INFLUENZA A AND B      Result Value Ref Range   Source-INFBD NASAL     Inflenza A Ag NEG  Negative   Influenza B Ag NEG  Negative     ASSESSMENT AND PLAN:  53 y.o. year old female with  1. Bacterial respiratory infection Use Tylenol and Motrin to keep fever control. Can use over-the-counter decongestants and cough medications as needed percent relief. Complete all of the antibiotic.  Follow up if symptoms do not resolve within one week after completion of antibiotic. Gave her a note to be out of school for today. Says she needs no other notes for school and needs no note  to turn in to work. - azithromycin (ZITHROMAX) 250 MG tablet; Day 1: Take 2 daily.   Days 2-5: Take 1 daily.  Dispense: 6 tablet; Refill: 0  2. Fever, unspecified - Influenza a and b   Signed, [address] [place], [place], [facility] 11/27/2013 12:15 PM

## 2013-12-02 ENCOUNTER — Telehealth: Payer: Self-pay | Admitting: Family Medicine

## 2013-12-02 NOTE — Telephone Encounter (Signed)
PT is still coughing and wheezing, and no energy for anything. She was seen by Olean Ree on the 18th and the pt states she is just not feeling any better.  Pharmacy [facility]  Call back number is [phone]

## 2013-12-03 NOTE — Telephone Encounter (Signed)
Patient is already on abx and if getting worse needs to be seen.

## 2013-12-03 NOTE — Telephone Encounter (Signed)
Left patient message per provider that she needs to be seen

## 2013-12-11 ENCOUNTER — Ambulatory Visit (INDEPENDENT_AMBULATORY_CARE_PROVIDER_SITE_OTHER): Payer: Medicare Other | Admitting: Family Medicine

## 2013-12-11 ENCOUNTER — Encounter: Payer: Self-pay | Admitting: Family Medicine

## 2013-12-11 VITALS — BP 110/74 | HR 68 | Temp 97.3°F | Resp 18 | Ht 62.0 in | Wt 139.0 lb

## 2013-12-11 DIAGNOSIS — J310 Chronic rhinitis: Secondary | ICD-10-CM

## 2013-12-11 DIAGNOSIS — J329 Chronic sinusitis, unspecified: Secondary | ICD-10-CM

## 2013-12-11 MED ORDER — PREDNISONE 20 MG PO TABS: ORAL_TABLET | ORAL | Status: DC

## 2013-12-11 MED ORDER — CEFDINIR 300 MG PO CAPS: 300.0000 mg | ORAL_CAPSULE | Freq: Two times a day (BID) | ORAL | Status: DC

## 2013-12-11 NOTE — Progress Notes (Signed)
Subjective:    Patient ID: [name], female    DOB: Dec 22, 1960, 53 y.o.   MRN: [id number]  HPI Patient was seen several weeks ago and diagnosed with a bacterial upper respiratory tract infection was treated with a Z-Pak. She continues to have frontal sinus headache. She has a bad taste in her mouth. She has rhinorrhea and congestion. She has headaches. She is also reporting wheezing and shortness of breath and dyspnea on exertion. She does have a history of asthma. She denies any hemoptysis or chest pain. She reports fatigue. Past Medical History  Diagnosis Date  . MRSA (methicillin resistant Staphylococcus aureus) carrier   . CA - skin cancer   . Crush injury of hand RIGHT IN 2007    X 6 SURG'S  LAST ONE BEING TOTAL WRIST FUSION W/ BONE GRAFT AND PLATE  . Chronic pain due to injury RIGHT HAND/WRIST IN 2007  . Depression   . PTSD (post-traumatic stress disorder) 2007 RIGHT HAND CRUSH INJURY W/ 2 FINGER AMPUTATION  . Stroke X2 CVA'S  IN 1990    LOSS OF VISION RIGHT EYE  . Weakness of right leg CAUSED BY RETRIEVAL THIGH MUSCLE FOR FLAP RIGHT HAND INJURY-- 2006    RIGHT LEG GIVES OUT OCCASIONALLY  . Anxiety     BILATERAL HANDS-- LEFT >RIGHT AND LEGS  . GERD (gastroesophageal reflux disease)     TAKES TUMS PRN  . Asthma     NO INHALER  . Arthritis   . Neuromuscular disorder     some fibromyalgia  . Blood transfusion without reported diagnosis   . Substance abuse     hx: narcotic use  . Hemorrhoids   . Bed bug bite    Current Outpatient Prescriptions on File Prior to Visit  Medication Sig Dispense Refill  . atorvastatin (LIPITOR) 40 MG tablet TAKE 1 TABLET BY MOUTH DAILY  30 tablet  0  . Multiple Vitamin (MULTIVITAMIN) tablet Take 1 tablet by mouth daily.       No current facility-administered medications on file prior to visit.   Allergies  Allergen Reactions  . Aspirin Shortness Of Breath    ASTHMATIC ATTACK  . Codeine Other (See Comments)    ABNORMAL BEHAVIOR   Increased in bad moods    History   Social History  . Marital Status: Single    Spouse Name: N/A    Number of Children: 6  . Years of Education: N/A   Occupational History  . Housekeeper     Social History Main Topics  . Smoking status: Former Smoker -- 1.50 packs/day for 28 years    Types: Cigarettes    Quit date: 11/29/2001  . Smokeless tobacco: Never Used  . Alcohol Use: 0.0 oz/week     Comment: 4 drinks a week  . Drug Use: Yes    Special: Marijuana     Comment: 10-09-12- pt has quit  . Sexual Activity: Not Currently    Partners: Male    Birth Control/ Protection: Post-menopausal   Other Topics Concern  . Not on file   Social History Narrative  . No narrative on file      Review of Systems  All other systems reviewed and are negative.       Objective:   Physical Exam  Vitals reviewed. Constitutional: She appears well-developed and well-nourished. No distress.  HENT:  Right Ear: External ear normal.  Left Ear: External ear normal.  Nose: Mucosal edema and rhinorrhea present. Right sinus exhibits frontal  sinus tenderness. Left sinus exhibits frontal sinus tenderness.  Mouth/Throat: Oropharynx is clear and moist. No oropharyngeal exudate.  Eyes: Conjunctivae are normal. Pupils are equal, round, and reactive to light.  Neck: Neck supple. No thyromegaly present.  Cardiovascular: Normal rate, regular rhythm and normal heart sounds.   No murmur heard. Pulmonary/Chest: Effort normal. No respiratory distress. She has decreased breath sounds. She has no wheezes. She has no rhonchi. She has no rales.  Abdominal: Soft. Bowel sounds are normal.  Lymphadenopathy:    She has no cervical adenopathy.  Skin: She is not diaphoretic.          Assessment & Plan:  1. Rhinosinusitis Begin Omnicef 300 mg by mouth twice a day for 10 days. Also the patient on a prednisone taper pack I believe is reactive airway disease reacting to the sinus infection. Her breathing  worsens I would start patient on albuterol 2 puffs inhaled every 6 hours - cefdinir (OMNICEF) 300 MG capsule; Take 1 capsule (300 mg total) by mouth 2 (two) times daily.  Dispense: 20 capsule; Refill: 0 - predniSONE (DELTASONE) 20 MG tablet; 3 tabs poqday 1-2, 2 tabs poqday 3-4, 1 tab poqday 5-6  Dispense: 12 tablet; Refill: 0

## 2013-12-13 ENCOUNTER — Ambulatory Visit (INDEPENDENT_AMBULATORY_CARE_PROVIDER_SITE_OTHER): Payer: Medicare Other | Admitting: Family Medicine

## 2013-12-13 ENCOUNTER — Encounter: Payer: Self-pay | Admitting: Family Medicine

## 2013-12-13 VITALS — BP 118/60 | HR 62 | Temp 98.2°F | Resp 14 | Ht 61.5 in | Wt 141.0 lb

## 2013-12-13 DIAGNOSIS — J019 Acute sinusitis, unspecified: Secondary | ICD-10-CM | POA: Insufficient documentation

## 2013-12-13 DIAGNOSIS — J209 Acute bronchitis, unspecified: Secondary | ICD-10-CM

## 2013-12-13 MED ORDER — ALBUTEROL SULFATE HFA 108 (90 BASE) MCG/ACT IN AERS: 2.0000 | INHALATION_SPRAY | RESPIRATORY_TRACT | Status: DC | PRN

## 2013-12-13 MED ORDER — FLUTICASONE PROPIONATE 50 MCG/ACT NA SUSP: 2.0000 | Freq: Every day | NASAL | Status: DC

## 2013-12-13 NOTE — Patient Instructions (Signed)
Get the Mucinex DM Start flonase Use the albuterol as needed for chest tightness Call if not better, then Chest xray will be done F/U as needed

## 2013-12-13 NOTE — Assessment & Plan Note (Signed)
Abdomen her albuterol she can complete the prednisone taper her oxygen saturation was normal she does have a little bit congested in the chest but normal work of breathing. If her breathing worsens throughout the weekend she will  Need a chest x-ray. I attempted to get the x-ray today however she was unable to do so.

## 2013-12-13 NOTE — Assessment & Plan Note (Signed)
Will have her complete antibiotics I will also add Flonase I've given her Nortrel AD from the office he uses a decongestant

## 2013-12-13 NOTE — Progress Notes (Signed)
Patient ID: [name], female   DOB: 1960/09/20, 53 y.o.   MRN: [id number]   Subjective:    Patient ID: [name], female    DOB: 07/07/1961, 53 y.o.   MRN: [id number]  Patient presents for Sinusitis getting worse  patient here to followup sinusitis and reactive airways. She was seen on March 18 at that time diagnosed with respiratory infection she was given a Z-Pak. She is a former smoker. She states that she continued to worsen and then started having sinus pressure and drainage therefore was seen on April 1 was prescribed prednisone and Omnicef the prednisone was more for the reactive airways noted on exam. She also has a remote history of asthma. She follows up today 55 hours later states that her face still tender and swollen and her chest still feels tight when she takes a breath. She's also had some wheezing and subjective fever. She hasn't taken her medications as prescribed. She is more concerned that she feels a little flushed and she still has tenderness in her sinuses    Review Of Systems:  GEN- denies fatigue, fever, weight loss,weakness, recent illness HEENT- denies eye drainage, change in vision, +nasal discharge, CVS- denies chest pain, palpitations RESP- denies SOB, +cough, +wheeze Neuro- denies headache, dizziness, syncope, seizure activity       Objective:    BP 118/60  Pulse 62  Temp(Src) 98.2 F (36.8 C) (Oral)  Resp 14  Ht 5' 1.5" (1.562 m)  Wt 141 lb (63.957 kg)  BMI 26.21 kg/m2  SpO2 98% GEN- NAD, alert and oriented x3 HEENT- PERRL, EOMI, non injected sclera, pink conjunctiva, MMM, oropharynx clear, + maxillary sinus tenderness, appears flushed in the face, +enlarged nasal turbinates, clear rhinorrhea Neck- Supple, shotty LAD CVS- RRR, no murmur RESP- mild congestion bilat, no wheeze, normal WOB EXT- No edema Pulses- Radial 2+        Assessment & Plan:      Problem List Items Addressed This Visit   Acute sinusitis - Primary     Will  have her complete antibiotics I will also add Flonase I've given her Nortrel AD from the office he uses a decongestant    Relevant Medications      fluticasone (FLONASE) 50 MCG nasal spray   Acute bronchitis     Abdomen her albuterol she can complete the prednisone taper her oxygen saturation was normal she does have a little bit congested in the chest but normal work of breathing. If her breathing worsens throughout the weekend she will  Need a chest x-ray. I attempted to get the x-ray today however she was unable to do so.       Note: This dictation was prepared with Dragon dictation along with smaller phrase technology. Any transcriptional errors that result from this process are unintentional.

## 2013-12-23 ENCOUNTER — Other Ambulatory Visit: Payer: Self-pay | Admitting: Family Medicine

## 2014-02-21 ENCOUNTER — Encounter: Payer: Self-pay | Admitting: Family Medicine

## 2014-02-21 ENCOUNTER — Ambulatory Visit (INDEPENDENT_AMBULATORY_CARE_PROVIDER_SITE_OTHER): Payer: Medicare Other | Admitting: Family Medicine

## 2014-02-21 VITALS — BP 140/80 | HR 60 | Temp 97.1°F | Resp 16 | Ht 62.0 in | Wt 145.0 lb

## 2014-02-21 DIAGNOSIS — R062 Wheezing: Secondary | ICD-10-CM

## 2014-02-21 DIAGNOSIS — D485 Neoplasm of uncertain behavior of skin: Secondary | ICD-10-CM

## 2014-02-21 NOTE — Progress Notes (Signed)
Subjective:    Patient ID: [name], female    DOB: 1961-05-28, 53 y.o.   MRN: [id number]  HPI Ever since the patient was seen in March for an upper respiratory tract infection, she states that she has been wheezing. She feels like her airways are tight. She's developed some shortness of breath with activity. She does have a history of tobacco use. She quit smoking possibly 10 years ago. However she did smoke for 20 pack years previous to that.  She also states that she was told she had asthma as a child. She denies any productive cough. She denies any chest pain. She denies any pleurisy.  She also has a lesion that developed on her upper left shoulder 2 months ago. Is an erythematous excoriated red papule. It is difficult to ascertain the diagnosis because the area is irritated from where the patient has been picking at it.  He is millimeters in size. She is requesting a skin biopsy. Past Medical History  Diagnosis Date  . MRSA (methicillin resistant Staphylococcus aureus) carrier   . CA - skin cancer   . Crush injury of hand RIGHT IN 2007    X 6 SURG'S  LAST ONE BEING TOTAL WRIST FUSION W/ BONE GRAFT AND PLATE  . Chronic pain due to injury RIGHT HAND/WRIST IN 2007  . Depression   . PTSD (post-traumatic stress disorder) 2007 RIGHT HAND CRUSH INJURY W/ 2 FINGER AMPUTATION  . Stroke X2 CVA'S  IN 1990    LOSS OF VISION RIGHT EYE  . Weakness of right leg CAUSED BY RETRIEVAL THIGH MUSCLE FOR FLAP RIGHT HAND INJURY-- 2006    RIGHT LEG GIVES OUT OCCASIONALLY  . Anxiety     BILATERAL HANDS-- LEFT >RIGHT AND LEGS  . GERD (gastroesophageal reflux disease)     TAKES TUMS PRN  . Asthma     NO INHALER  . Arthritis   . Neuromuscular disorder     some fibromyalgia  . Blood transfusion without reported diagnosis   . Substance abuse     hx: narcotic use  . Hemorrhoids   . Bed bug bite    Current Outpatient Prescriptions on File Prior to Visit  Medication Sig Dispense Refill  .  albuterol (PROVENTIL HFA;VENTOLIN HFA) 108 (90 BASE) MCG/ACT inhaler Inhale 2 puffs into the lungs every 4 (four) hours as needed for wheezing or shortness of breath.  1 Inhaler  0  . fluticasone (FLONASE) 50 MCG/ACT nasal spray Place 2 sprays into both nostrils daily.  16 g  1  . Multiple Vitamin (MULTIVITAMIN) tablet Take 1 tablet by mouth daily.       No current facility-administered medications on file prior to visit.   Past Surgical History  Procedure Laterality Date  . Skin cancer removal  2007    throat area  . Right total wrist fusion w/ bone graft and plate  [id number]    AND FLAP USING RIGHT GOIN MUSCLE  . Hand carpectomy  12-24-2005    RIGHT-- INCLUDING  REMOVAL  WRIST WIRES AND I &D (EXICIOSNAL)  FOR OSTEOMYELITIS  . Orif radial fracture  09-04-05    DISTAL RADIAL PLATE AND PARTIAL THUMB AMPUTATION  . Incision / drainage hand / finger  X3  (2006 & 2007)    RIGHT CRUSH WOUND  . Orif carpal bone fracture  08-31-05    I & D OF RIGHT HAND CRUSH INJURY/ OPEN FX'S AND INDEX FINGER RAY RESECTION/ COMPLICATED CLOSURE  . Tubal ligation  15  YRS AGO  . Hysteroscopy w/d&c  08/18/2011    Procedure: DILATATION AND CURETTAGE (D&C) /HYSTEROSCOPY;  Surgeon: Anastasio Auerbach, MD;  Location: [place];  Service: Gynecology;  Laterality: N/A;  . Lesion removal  08/18/2011    Procedure: EXCISION VAGINAL LESION;  Surgeon: Anastasio Auerbach, MD;  Location: [place];  Service: Gynecology;  Laterality: N/A;  . Hysteroscopy  12.6.2012    w/removal of polyp  . Colon surgery      15 years ago- fistula on colon removed   Allergies  Allergen Reactions  . Aspirin Shortness Of Breath    ASTHMATIC ATTACK  . Codeine Other (See Comments)    ABNORMAL BEHAVIOR  Increased in bad moods    History   Social History  . Marital Status: Single    Spouse Name: N/A    Number of Children: 6  . Years of Education: N/A   Occupational History  . Housekeeper     Social  History Main Topics  . Smoking status: Former Smoker -- 1.50 packs/day for 28 years    Types: Cigarettes    Quit date: 11/29/2001  . Smokeless tobacco: Never Used  . Alcohol Use: 0.0 oz/week     Comment: 4 drinks a week  . Drug Use: Yes    Special: Marijuana     Comment: 10-09-12- pt has quit  . Sexual Activity: Not Currently    Partners: Male    Birth Control/ Protection: Post-menopausal   Other Topics Concern  . Not on file   Social History Narrative  . No narrative on file      Review of Systems  All other systems reviewed and are negative.      Objective:   Physical Exam  Vitals reviewed. Constitutional: She appears well-developed and well-nourished.  Cardiovascular: Normal rate, regular rhythm and normal heart sounds.   Pulmonary/Chest: Effort normal. She has wheezes.  Abdominal: Soft. Bowel sounds are normal. She exhibits no distension. There is no tenderness. There is no rebound.  Skin: No rash noted. No erythema. No pallor.   4 mm erythematous excoriated red papule on the left upper shoulder        Assessment & Plan:  1. Neoplasm of uncertain behavior of skin The lesion was anesthetized with 0.1% lidocaine with epinephrine. Shave biopsy was performed and the lesion was sent to pathology and labeled container. Hemostasis was achieved with Drysol and a Band-Aid. The patient tolerated the procedure well with no complications. - Pathology  2. Wheezing From spirometry in the office today and. FEV1 was 1. 7 L. FVC was 2.84 L. Her FEV1 percentage was 55% indicating obstructive airway disease. Her FEV1 was 66% of predicted indicating moderate COPD (stage II).  Again the patient a sample of Symbicort 80/125 2 puffs inhaled twice a day to try. Recheck in 2 weeks to see her wheezing has improved.

## 2014-02-25 LAB — PATHOLOGY

## 2014-03-05 ENCOUNTER — Telehealth: Payer: Self-pay | Admitting: *Deleted

## 2014-03-05 MED ORDER — BUDESONIDE-FORMOTEROL FUMARATE 80-4.5 MCG/ACT IN AERO: 2.0000 | INHALATION_SPRAY | Freq: Two times a day (BID) | RESPIRATORY_TRACT | Status: DC

## 2014-03-05 NOTE — Telephone Encounter (Signed)
Med sent to pharm 

## 2014-03-05 NOTE — Telephone Encounter (Signed)
Pt called stating she has a sample of Symbicort and stated it has worked great and wants would like a refill on it.   Refill Symbicort 80/125 2 puffs inhaled twice a day   Pharmacy [facility]

## 2014-04-02 ENCOUNTER — Telehealth: Payer: Self-pay | Admitting: Family Medicine

## 2014-04-02 NOTE — Telephone Encounter (Signed)
Called and Lm for pt to call back to schedule [facility] and CPE

## 2014-04-08 ENCOUNTER — Other Ambulatory Visit: Payer: Self-pay | Admitting: Family Medicine

## 2014-04-08 DIAGNOSIS — Z0000 Encounter for general adult medical examination without abnormal findings: Secondary | ICD-10-CM

## 2014-04-08 DIAGNOSIS — Z79899 Other long term (current) drug therapy: Secondary | ICD-10-CM

## 2014-04-08 DIAGNOSIS — F4310 Post-traumatic stress disorder, unspecified: Secondary | ICD-10-CM

## 2014-04-22 ENCOUNTER — Other Ambulatory Visit: Payer: Medicare Other

## 2014-04-23 ENCOUNTER — Other Ambulatory Visit: Payer: Medicare Other

## 2014-04-23 DIAGNOSIS — Z79899 Other long term (current) drug therapy: Secondary | ICD-10-CM

## 2014-04-23 DIAGNOSIS — Z0000 Encounter for general adult medical examination without abnormal findings: Secondary | ICD-10-CM

## 2014-04-23 DIAGNOSIS — F4310 Post-traumatic stress disorder, unspecified: Secondary | ICD-10-CM

## 2014-04-23 LAB — COMPLETE METABOLIC PANEL WITH GFR
ALK PHOS: 49 U/L (ref 39–117)
ALT: 22 U/L (ref 0–35)
AST: 28 U/L (ref 0–37)
Albumin: 4.2 g/dL (ref 3.5–5.2)
BILIRUBIN TOTAL: 0.5 mg/dL (ref 0.2–1.2)
BUN: 12 mg/dL (ref 6–23)
CO2: 27 mEq/L (ref 19–32)
Calcium: 9.7 mg/dL (ref 8.4–10.5)
Chloride: 105 mEq/L (ref 96–112)
Creat: 0.84 mg/dL (ref 0.50–1.10)
GFR, EST NON AFRICAN AMERICAN: 79 mL/min
GFR, Est African American: >89 mL/min
GLUCOSE: 80 mg/dL (ref 70–99)
Potassium: 4.5 mEq/L (ref 3.5–5.3)
SODIUM: 141 meq/L (ref 135–145)
Total Protein: 6.5 g/dL (ref 6.0–8.3)

## 2014-04-23 LAB — LIPID PANEL
CHOL/HDL RATIO: 3.6 ratio
CHOLESTEROL: 202 mg/dL — AB (ref 0–200)
HDL: 56 mg/dL (ref >39)
LDL CALC: 116 mg/dL — AB (ref 0–99)
TRIGLYCERIDES: 150 mg/dL — AB (ref <150)
VLDL: 30 mg/dL (ref 0–40)

## 2014-04-23 LAB — CBC WITH DIFFERENTIAL/PLATELET
Basophils Absolute: 0 10*3/uL (ref 0.0–0.1)
Basophils Relative: 0 % (ref 0–1)
EOS ABS: 0.2 10*3/uL (ref 0.0–0.7)
Eosinophils Relative: 3 % (ref 0–5)
HEMATOCRIT: 37.3 % (ref 36.0–46.0)
HEMOGLOBIN: 12.6 g/dL (ref 12.0–15.0)
LYMPHS ABS: 2.7 10*3/uL (ref 0.7–4.0)
Lymphocytes Relative: 34 % (ref 12–46)
MCH: 31.3 pg (ref 26.0–34.0)
MCHC: 33.8 g/dL (ref 30.0–36.0)
MCV: 92.8 fL (ref 78.0–100.0)
MONO ABS: 0.9 10*3/uL (ref 0.1–1.0)
MONOS PCT: 11 % (ref 3–12)
NEUTROS PCT: 52 % (ref 43–77)
Neutro Abs: 4.1 10*3/uL (ref 1.7–7.7)
Platelets: 326 10*3/uL (ref 150–400)
RBC: 4.02 MIL/uL (ref 3.87–5.11)
RDW: 13.2 % (ref 11.5–15.5)
WBC: 7.8 10*3/uL (ref 4.0–10.5)

## 2014-04-23 LAB — TSH: TSH: 1.906 u[IU]/mL (ref 0.350–4.500)

## 2014-04-29 ENCOUNTER — Ambulatory Visit (INDEPENDENT_AMBULATORY_CARE_PROVIDER_SITE_OTHER): Payer: Medicare Other | Admitting: Family Medicine

## 2014-04-29 ENCOUNTER — Encounter: Payer: Self-pay | Admitting: Family Medicine

## 2014-04-29 VITALS — BP 142/92 | HR 60 | Temp 97.9°F | Resp 16 | Ht 62.0 in | Wt 146.0 lb

## 2014-04-29 DIAGNOSIS — Z0000 Encounter for general adult medical examination without abnormal findings: Secondary | ICD-10-CM

## 2014-04-29 NOTE — Progress Notes (Signed)
Subjective:    Patient ID: [name], female    DOB: 02-02-1961, 53 y.o.   MRN: [id number]  HPI Patient is a 53 year old white female who is here today for complete physical exam.  She is overdue to see her gynecologist for her Pap smear. I encouraged her to do that. She will schedule this at her convenience. Her mammogram is due in September. Her last mammogram from September 2014 was normal. Colonoscopy was performed last year. There is no evidence of cancer. The patient did have abrasions. Her gastroenterologist recommended canasa but she stopped it on her own.  Overall she is doing well. She denies any complaints. She does have a history of remote stroke. She is not taking an aspirin due to an allergy to aspirin. She discontinued her cholesterol medication on her iron. Her LDL cholesterol has risen from 69 now to 116. Her blood pressure is elevated at 142/92. Patient is eating fatty foods, she's eating lots of pork and red meat, she's eating a lot of fried foods. Her most recent lab work is listed below: Appointment on 04/23/2014  Component Date Value Ref Range Status  . WBC 04/23/2014 7.8  4.0 - 10.5 K/uL Final  . RBC 04/23/2014 4.02  3.87 - 5.11 MIL/uL Final  . Hemoglobin 04/23/2014 12.6  12.0 - 15.0 g/dL Final  . HCT 04/23/2014 37.3  36.0 - 46.0 % Final  . MCV 04/23/2014 92.8  78.0 - 100.0 fL Final  . MCH 04/23/2014 31.3  26.0 - 34.0 pg Final  . MCHC 04/23/2014 33.8  30.0 - 36.0 g/dL Final  . RDW 04/23/2014 13.2  11.5 - 15.5 % Final  . Platelets 04/23/2014 326  150 - 400 K/uL Final  . Neutrophils Relative % 04/23/2014 52  43 - 77 % Final  . Neutro Abs 04/23/2014 4.1  1.7 - 7.7 K/uL Final  . Lymphocytes Relative 04/23/2014 34  12 - 46 % Final  . Lymphs Abs 04/23/2014 2.7  0.7 - 4.0 K/uL Final  . Monocytes Relative 04/23/2014 11  3 - 12 % Final  . Monocytes Absolute 04/23/2014 0.9  0.1 - 1.0 K/uL Final  . Eosinophils Relative 04/23/2014 3  0 - 5 % Final  . Eosinophils Absolute  04/23/2014 0.2  0.0 - 0.7 K/uL Final  . Basophils Relative 04/23/2014 0  0 - 1 % Final  . Basophils Absolute 04/23/2014 0.0  0.0 - 0.1 K/uL Final  . Smear Review 04/23/2014 Criteria for review not met   Final  . Cholesterol 04/23/2014 202* 0 - 200 mg/dL Final   Comment: ATP III Classification:                                < 200        mg/dL        Desirable                               200 - 239     mg/dL        Borderline High                               >= 240        mg/dL        High                             .  Triglycerides 04/23/2014 150* <150 mg/dL Final  . HDL 04/23/2014 56  >39 mg/dL Final  . Total CHOL/HDL Ratio 04/23/2014 3.6   Final  . VLDL 04/23/2014 30  0 - 40 mg/dL Final  . LDL Cholesterol 04/23/2014 116* 0 - 99 mg/dL Final   Comment:                            Total Cholesterol/HDL Ratio:CHD Risk                                                 Coronary Heart Disease Risk Table                                                                 Men       Women                                   1/2 Average Risk              3.4        3.3                                       Average Risk              5.0        4.4                                    2X Average Risk              9.6        7.1                                    3X Average Risk             23.4       11.0                          Use the calculated Patient Ratio above and the CHD Risk table                           to determine the patient's CHD Risk.                          ATP III Classification (LDL):                                < 100        mg/dL         Optimal  100 - 129     mg/dL         Near or Above Optimal                               130 - 159     mg/dL         Borderline High                               160 - 189     mg/dL         High                                > 190        mg/dL         Very High                             . TSH 04/23/2014 1.906   0.350 - 4.500 uIU/mL Final  . Sodium 04/23/2014 141  135 - 145 mEq/L Final  . Potassium 04/23/2014 4.5  3.5 - 5.3 mEq/L Final  . Chloride 04/23/2014 105  96 - 112 mEq/L Final  . CO2 04/23/2014 27  19 - 32 mEq/L Final  . Glucose, Bld 04/23/2014 80  70 - 99 mg/dL Final  . BUN 04/23/2014 12  6 - 23 mg/dL Final  . Creat 04/23/2014 0.84  0.50 - 1.10 mg/dL Final  . Total Bilirubin 04/23/2014 0.5  0.2 - 1.2 mg/dL Final  . Alkaline Phosphatase 04/23/2014 49  39 - 117 U/L Final  . AST 04/23/2014 28  0 - 37 U/L Final  . ALT 04/23/2014 22  0 - 35 U/L Final  . Total Protein 04/23/2014 6.5  6.0 - 8.3 g/dL Final  . Albumin 04/23/2014 4.2  3.5 - 5.2 g/dL Final  . Calcium 04/23/2014 9.7  8.4 - 10.5 mg/dL Final  . GFR, Est African American 04/23/2014 >89   Final  . GFR, Est Non African American 04/23/2014 79   Final   Comment:                            The estimated GFR is a calculation valid for adults (>=74 years old)                          that uses the CKD-EPI algorithm to adjust for age and sex. It is                            not to be used for children, pregnant women, hospitalized patients,                             patients on dialysis, or with rapidly changing kidney function.                          According to the NKDEP, eGFR >89 is normal, 60-89 shows mild  impairment, 30-59 shows moderate impairment, 15-29 shows severe                          impairment and <15 is ESRD.                              Past Medical History  Diagnosis Date  . MRSA (methicillin resistant Staphylococcus aureus) carrier   . CA - skin cancer   . Crush injury of hand RIGHT IN 2007    X 6 SURG'S  LAST ONE BEING TOTAL WRIST FUSION W/ BONE GRAFT AND PLATE  . Chronic pain due to injury RIGHT HAND/WRIST IN 2007  . Depression   . PTSD (post-traumatic stress disorder) 2007 RIGHT HAND CRUSH INJURY W/ 2 FINGER AMPUTATION  . Stroke X2 CVA'S  IN 1990    LOSS OF VISION RIGHT EYE  .  Weakness of right leg CAUSED BY RETRIEVAL THIGH MUSCLE FOR FLAP RIGHT HAND INJURY-- 2006    RIGHT LEG GIVES OUT OCCASIONALLY  . Anxiety     BILATERAL HANDS-- LEFT >RIGHT AND LEGS  . GERD (gastroesophageal reflux disease)     TAKES TUMS PRN  . Asthma     NO INHALER  . Arthritis   . Neuromuscular disorder     some fibromyalgia  . Blood transfusion without reported diagnosis   . Substance abuse     hx: narcotic use  . Hemorrhoids   . Bed bug bite    Past Surgical History  Procedure Laterality Date  . Skin cancer removal  2007    throat area  . Right total wrist fusion w/ bone graft and plate  [id number]    AND FLAP USING RIGHT GOIN MUSCLE  . Hand carpectomy  12-24-2005    RIGHT-- INCLUDING  REMOVAL  WRIST WIRES AND I &D (EXICIOSNAL)  FOR OSTEOMYELITIS  . Orif radial fracture  09-04-05    DISTAL RADIAL PLATE AND PARTIAL THUMB AMPUTATION  . Incision / drainage hand / finger  X3  (2006 & 2007)    RIGHT CRUSH WOUND  . Orif carpal bone fracture  08-31-05    I & D OF RIGHT HAND CRUSH INJURY/ OPEN FX'S AND INDEX FINGER RAY RESECTION/ COMPLICATED CLOSURE  . Tubal ligation  15 YRS AGO  . Hysteroscopy w/d&c  08/18/2011    Procedure: DILATATION AND CURETTAGE (D&C) /HYSTEROSCOPY;  Surgeon: Anastasio Auerbach, MD;  Location: [place];  Service: Gynecology;  Laterality: N/A;  . Lesion removal  08/18/2011    Procedure: EXCISION VAGINAL LESION;  Surgeon: Anastasio Auerbach, MD;  Location: [place];  Service: Gynecology;  Laterality: N/A;  . Hysteroscopy  12.6.2012    w/removal of polyp  . Colon surgery      15 years ago- fistula on colon removed   Current Outpatient Prescriptions on File Prior to Visit  Medication Sig Dispense Refill  . albuterol (PROVENTIL HFA;VENTOLIN HFA) 108 (90 BASE) MCG/ACT inhaler Inhale 2 puffs into the lungs every 4 (four) hours as needed for wheezing or shortness of breath.  1 Inhaler  0  . budesonide-formoterol (SYMBICORT) 80-4.5  MCG/ACT inhaler Inhale 2 puffs into the lungs 2 (two) times daily.  1 Inhaler  3  . Multiple Vitamin (MULTIVITAMIN) tablet Take 1 tablet by mouth daily.       No current facility-administered medications on file prior to visit.   Allergies  Allergen  Reactions  . Aspirin Shortness Of Breath    ASTHMATIC ATTACK  . Codeine Other (See Comments)    ABNORMAL BEHAVIOR  Increased in bad moods    History   Social History  . Marital Status: Single    Spouse Name: N/A    Number of Children: 6  . Years of Education: N/A   Occupational History  . Housekeeper     Social History Main Topics  . Smoking status: Former Smoker -- 1.50 packs/day for 28 years    Types: Cigarettes    Quit date: 11/29/2001  . Smokeless tobacco: Never Used  . Alcohol Use: 0.0 oz/week     Comment: 4 drinks a week  . Drug Use: Yes    Special: Marijuana     Comment: 10-09-12- pt has quit  . Sexual Activity: Not Currently    Partners: Male    Birth Control/ Protection: Post-menopausal   Other Topics Concern  . Not on file   Social History Narrative  . No narrative on file   Family History  Problem Relation Age of Onset  . Lupus Mother     died at 50  . Breast cancer Maternal Aunt   . Breast cancer Maternal Grandmother   . Stomach cancer Paternal Grandfather   . Colon cancer Neg Hx   . Rectal cancer Neg Hx   . Esophageal cancer Neg Hx       Review of Systems  All other systems reviewed and are negative.      Objective:   Physical Exam  Vitals reviewed. Constitutional: She is oriented to person, place, and time. She appears well-developed and well-nourished. No distress.  HENT:  Head: Normocephalic and atraumatic.  Right Ear: External ear normal.  Left Ear: External ear normal.  Nose: Nose normal.  Mouth/Throat: Oropharynx is clear and moist. No oropharyngeal exudate.  Eyes: Conjunctivae and EOM are normal. Pupils are equal, round, and reactive to light. Right eye exhibits no discharge. Left  eye exhibits no discharge. No scleral icterus.  Neck: Normal range of motion. Neck supple. No JVD present. No tracheal deviation present. No thyromegaly present.  Cardiovascular: Normal rate, regular rhythm, normal heart sounds and intact distal pulses.  Exam reveals no gallop and no friction rub.   No murmur heard. Pulmonary/Chest: Effort normal and breath sounds normal. No stridor. No respiratory distress. She has no wheezes. She has no rales. She exhibits no tenderness.  Abdominal: Soft. Bowel sounds are normal. She exhibits no distension and no mass. There is no tenderness. There is no rebound and no guarding.  Musculoskeletal: Normal range of motion. She exhibits no edema and no tenderness.  Lymphadenopathy:    She has no cervical adenopathy.  Neurological: She is alert and oriented to person, place, and time. She has normal reflexes. She displays normal reflexes. No cranial nerve deficit. She exhibits normal muscle tone. Coordination normal.  Skin: Skin is warm. No rash noted. She is not diaphoretic. No erythema. No pallor.  Psychiatric: She has a normal mood and affect. Her behavior is normal. Judgment and thought content normal.   patient's right hand and right forearm are disfigured from a remote industrial accident. This is chronic.        Assessment & Plan:  Patient's physical exam is normal. However I am concerned about her weight, her blood pressure, and her cholesterol. I recommended a goal LDL cholesterol less than 70. She refuses any medication at this time. I recommended that she essentially become a vegetarian  produce her LDL cholesterol is close to 70 as possible. She does also sacrificed parkinsonian to reduce her blood pressure. She is to increase aerobic exercise to reduce her blood pressure. I would like the patient to lose approximately 10 pounds. Recheck lab work in 6 months. I scheduled patient for mammogram. I have asked the patient to schedule her Pap smear with her  gynecologist. Her colonoscopy is up to date. The remainder of her immunizations are up-to-date.

## 2014-06-02 ENCOUNTER — Ambulatory Visit
Admission: RE | Admit: 2014-06-02 | Discharge: 2014-06-02 | Disposition: A | Discharge status: Home / Self Care | Payer: Medicare Other | Source: Ambulatory Visit | Attending: Family Medicine | Admitting: Family Medicine

## 2014-06-02 DIAGNOSIS — Z0000 Encounter for general adult medical examination without abnormal findings: Secondary | ICD-10-CM

## 2014-06-12 ENCOUNTER — Ambulatory Visit (INDEPENDENT_AMBULATORY_CARE_PROVIDER_SITE_OTHER): Payer: Medicare Other | Admitting: Family Medicine

## 2014-06-12 DIAGNOSIS — Z23 Encounter for immunization: Secondary | ICD-10-CM

## 2014-07-14 ENCOUNTER — Encounter: Payer: Self-pay | Admitting: Family Medicine

## 2014-08-05 ENCOUNTER — Encounter: Payer: Self-pay | Admitting: Family Medicine

## 2014-08-05 ENCOUNTER — Ambulatory Visit (INDEPENDENT_AMBULATORY_CARE_PROVIDER_SITE_OTHER): Payer: Medicare Other | Admitting: Family Medicine

## 2014-08-05 VITALS — BP 132/74 | HR 70 | Temp 98.2°F | Resp 16 | Ht 62.0 in | Wt 148.0 lb

## 2014-08-05 DIAGNOSIS — E785 Hyperlipidemia, unspecified: Secondary | ICD-10-CM

## 2014-08-05 DIAGNOSIS — M5432 Sciatica, left side: Secondary | ICD-10-CM

## 2014-08-05 MED ORDER — GABAPENTIN 300 MG PO CAPS: 300.0000 mg | ORAL_CAPSULE | Freq: Three times a day (TID) | ORAL | Status: DC

## 2014-08-05 MED ORDER — PRAVASTATIN SODIUM 40 MG PO TABS: 40.0000 mg | ORAL_TABLET | Freq: Every day | ORAL | Status: DC

## 2014-08-05 NOTE — Progress Notes (Signed)
Subjective:    Patient ID: [name], female    DOB: 30-Dec-1960, 53 y.o.   MRN: [id number]  HPI Patient quit Lipitor due to altered sensation that she perceived while on the medication. After stopping the Lipitor her mental status returned to normal. She is doing fine now and she would like to try different statin medication. She also complains of a deep neuropathic type ache radiating down her left lateral leg into her left calf. It is better with ambulation. It is worse with sitting. It is also made worse by weather changes. Past Medical History  Diagnosis Date  . MRSA (methicillin resistant Staphylococcus aureus) carrier   . CA - skin cancer   . Crush injury of hand RIGHT IN 2007    X 6 SURG'S  LAST ONE BEING TOTAL WRIST FUSION W/ BONE GRAFT AND PLATE  . Chronic pain due to injury RIGHT HAND/WRIST IN 2007  . Depression   . PTSD (post-traumatic stress disorder) 2007 RIGHT HAND CRUSH INJURY W/ 2 FINGER AMPUTATION  . Stroke X2 CVA'S  IN 1990    LOSS OF VISION RIGHT EYE  . Weakness of right leg CAUSED BY RETRIEVAL THIGH MUSCLE FOR FLAP RIGHT HAND INJURY-- 2006    RIGHT LEG GIVES OUT OCCASIONALLY  . Anxiety     BILATERAL HANDS-- LEFT >RIGHT AND LEGS  . GERD (gastroesophageal reflux disease)     TAKES TUMS PRN  . Asthma     NO INHALER  . Arthritis   . Neuromuscular disorder     some fibromyalgia  . Blood transfusion without reported diagnosis   . Substance abuse     hx: narcotic use  . Hemorrhoids   . Bed bug bite    Past Surgical History  Procedure Laterality Date  . Skin cancer removal  2007    throat area  . Right total wrist fusion w/ bone graft and plate  [id number]    AND FLAP USING RIGHT GOIN MUSCLE  . Hand carpectomy  12-24-2005    RIGHT-- INCLUDING  REMOVAL  WRIST WIRES AND I &D (EXICIOSNAL)  FOR OSTEOMYELITIS  . Orif radial fracture  09-04-05    DISTAL RADIAL PLATE AND PARTIAL THUMB AMPUTATION  . Incision / drainage hand / finger  X3  (2006 & 2007)    RIGHT  CRUSH WOUND  . Orif carpal bone fracture  08-31-05    I & D OF RIGHT HAND CRUSH INJURY/ OPEN FX'S AND INDEX FINGER RAY RESECTION/ COMPLICATED CLOSURE  . Tubal ligation  15 YRS AGO  . Hysteroscopy w/d&c  08/18/2011    Procedure: DILATATION AND CURETTAGE (D&C) /HYSTEROSCOPY;  Surgeon: Anastasio Auerbach, MD;  Location: [place];  Service: Gynecology;  Laterality: N/A;  . Lesion removal  08/18/2011    Procedure: EXCISION VAGINAL LESION;  Surgeon: Anastasio Auerbach, MD;  Location: [place];  Service: Gynecology;  Laterality: N/A;  . Hysteroscopy  12.6.2012    w/removal of polyp  . Colon surgery      15 years ago- fistula on colon removed   Current Outpatient Prescriptions on File Prior to Visit  Medication Sig Dispense Refill  . albuterol (PROVENTIL HFA;VENTOLIN HFA) 108 (90 BASE) MCG/ACT inhaler Inhale 2 puffs into the lungs every 4 (four) hours as needed for wheezing or shortness of breath. 1 Inhaler 0  . budesonide-formoterol (SYMBICORT) 80-4.5 MCG/ACT inhaler Inhale 2 puffs into the lungs 2 (two) times daily. 1 Inhaler 3  . Multiple Vitamin (MULTIVITAMIN) tablet  Take 1 tablet by mouth daily.     No current facility-administered medications on file prior to visit.   Allergies  Allergen Reactions  . Aspirin Shortness Of Breath    ASTHMATIC ATTACK  . Codeine Other (See Comments)    ABNORMAL BEHAVIOR  Increased in bad moods    History   Social History  . Marital Status: Single    Spouse Name: N/A    Number of Children: 6  . Years of Education: N/A   Occupational History  . Housekeeper     Social History Main Topics  . Smoking status: Former Smoker -- 1.50 packs/day for 28 years    Types: Cigarettes    Quit date: 11/29/2001  . Smokeless tobacco: Never Used  . Alcohol Use: 0.0 oz/week     Comment: 4 drinks a week  . Drug Use: Yes    Special: Marijuana     Comment: 10-09-12- pt has quit  . Sexual Activity:    Partners: Male    Birth  Control/ Protection: Post-menopausal   Other Topics Concern  . Not on file   Social History Narrative      Review of Systems  All other systems reviewed and are negative.      Objective:   Physical Exam  Constitutional: She is oriented to person, place, and time.  Musculoskeletal: Normal range of motion.  Neurological: She is alert and oriented to person, place, and time. She has normal reflexes. She displays normal reflexes. No cranial nerve deficit. She exhibits normal muscle tone. Coordination normal.  Vitals reviewed.         Assessment & Plan:  HLD (hyperlipidemia) - Plan: pravastatin (PRAVACHOL) 40 MG tablet  Sciatica, left - Plan: gabapentin (NEURONTIN) 300 MG capsule  I will switch the patient to pravastatin 40 mg by mouth daily and recheck a fasting lipid panel in 3 months. Goal LDL cholesterol is less than 70 given her history of stroke. I will also start the patient on gabapentin 300 mg by mouth 3 times a day when necessary sciatica. If symptoms are not improving proceed with an MRI of the lumbar spine

## 2014-09-11 ENCOUNTER — Other Ambulatory Visit: Payer: Self-pay | Admitting: Family Medicine

## 2014-09-15 ENCOUNTER — Other Ambulatory Visit: Payer: Self-pay | Admitting: *Deleted

## 2014-09-15 MED ORDER — ALBUTEROL SULFATE HFA 108 (90 BASE) MCG/ACT IN AERS: 2.0000 | INHALATION_SPRAY | RESPIRATORY_TRACT | Status: DC | PRN

## 2014-09-15 NOTE — Telephone Encounter (Signed)
Pt called stating that she had called Thursday to get a refill on Albuterol and had not heard anything back, [facility].

## 2014-10-21 ENCOUNTER — Encounter: Payer: Self-pay | Admitting: Family Medicine

## 2014-10-21 ENCOUNTER — Ambulatory Visit (INDEPENDENT_AMBULATORY_CARE_PROVIDER_SITE_OTHER): Payer: Medicare Other | Admitting: Family Medicine

## 2014-10-21 VITALS — BP 122/78 | HR 74 | Temp 97.9°F | Resp 18 | Ht 62.0 in | Wt 148.0 lb

## 2014-10-21 DIAGNOSIS — I671 Cerebral aneurysm, nonruptured: Secondary | ICD-10-CM

## 2014-10-21 DIAGNOSIS — G4485 Primary stabbing headache: Secondary | ICD-10-CM | POA: Diagnosis not present

## 2014-10-21 NOTE — Progress Notes (Signed)
Subjective:    Patient ID: [name], female    DOB: [birth year], 54 y.o.   MRN: [id number]  HPI Patient reports that she was told she had aneurysms in her brain 20 years ago. This was diagnosed at [facility] after she had a stroke. At that time they recommended surgical intervention but she refused. She now reports an occipital headache that occurs suddenly. It is extremely intense and last just a few seconds or minutes and then resolve spontaneously. The headache is 10 out of 10 when it occurs. She is concerned that it could be one of aneurysms about to rupture. She denies any blurry vision or vision changes. She denies any nausea or vomiting and she denies any neurologic deficits. She denies any photophobia or phonophobia. The pain is located to the right side of her occiput. She denies any head trauma or falls recently. She denies any concussions. Past Surgical History  Procedure Laterality Date  . Skin cancer removal  2007    throat area  . Right total wrist fusion w/ bone graft and plate  [id number]    AND FLAP USING RIGHT GOIN MUSCLE  . Hand carpectomy  12-24-2005    RIGHT-- INCLUDING  REMOVAL  WRIST WIRES AND I &D (EXICIOSNAL)  FOR OSTEOMYELITIS  . Orif radial fracture  09-04-05    DISTAL RADIAL PLATE AND PARTIAL THUMB AMPUTATION  . Incision / drainage hand / finger  X3  (2006 & 2007)    RIGHT CRUSH WOUND  . Orif carpal bone fracture  08-31-05    I & D OF RIGHT HAND CRUSH INJURY/ OPEN FX'S AND INDEX FINGER RAY RESECTION/ COMPLICATED CLOSURE  . Tubal ligation  15 YRS AGO  . Hysteroscopy w/d&c  08/18/2011    Procedure: DILATATION AND CURETTAGE (D&C) /HYSTEROSCOPY;  Surgeon: Anastasio Auerbach, MD;  Location: [place];  Service: Gynecology;  Laterality: N/A;  . Lesion removal  08/18/2011    Procedure: EXCISION VAGINAL LESION;  Surgeon: Anastasio Auerbach, MD;  Location: [place];  Service: Gynecology;  Laterality: N/A;  . Hysteroscopy   12.6.2012    w/removal of polyp  . Colon surgery      15 years ago- fistula on colon removed   Past Medical History  Diagnosis Date  . MRSA (methicillin resistant Staphylococcus aureus) carrier   . CA - skin cancer   . Crush injury of hand RIGHT IN 2007    X 6 SURG'S  LAST ONE BEING TOTAL WRIST FUSION W/ BONE GRAFT AND PLATE  . Chronic pain due to injury RIGHT HAND/WRIST IN 2007  . Depression   . PTSD (post-traumatic stress disorder) 2007 RIGHT HAND CRUSH INJURY W/ 2 FINGER AMPUTATION  . Stroke X2 CVA'S  IN 1990    LOSS OF VISION RIGHT EYE  . Weakness of right leg CAUSED BY RETRIEVAL THIGH MUSCLE FOR FLAP RIGHT HAND INJURY-- 2006    RIGHT LEG GIVES OUT OCCASIONALLY  . Anxiety     BILATERAL HANDS-- LEFT >RIGHT AND LEGS  . GERD (gastroesophageal reflux disease)     TAKES TUMS PRN  . Asthma     NO INHALER  . Arthritis   . Neuromuscular disorder     some fibromyalgia  . Blood transfusion without reported diagnosis   . Substance abuse     hx: narcotic use  . Hemorrhoids   . Bed bug bite    Current Outpatient Prescriptions on File Prior to Visit  Medication Sig Dispense Refill  .  albuterol (PROVENTIL HFA;VENTOLIN HFA) 108 (90 BASE) MCG/ACT inhaler Inhale 2 puffs into the lungs every 4 (four) hours as needed for wheezing or shortness of breath. 1 Inhaler 1  . gabapentin (NEURONTIN) 300 MG capsule Take 1 capsule (300 mg total) by mouth 3 (three) times daily. 90 capsule 3  . Multiple Vitamin (MULTIVITAMIN) tablet Take 1 tablet by mouth daily.    . pravastatin (PRAVACHOL) 40 MG tablet Take 1 tablet (40 mg total) by mouth daily. 90 tablet 3  . SYMBICORT 80-4.5 MCG/ACT inhaler INHALE 2 PUFFS INTO THE LUNGS 2 TIMES DAILY 10.2 g 3   No current facility-administered medications on file prior to visit.   Allergies  Allergen Reactions  . Aspirin Shortness Of Breath    ASTHMATIC ATTACK  . Codeine Other (See Comments)    ABNORMAL BEHAVIOR  Increased in bad moods    History   Social  History  . Marital Status: Single    Spouse Name: N/A    Number of Children: 6  . Years of Education: N/A   Occupational History  . Housekeeper     Social History Main Topics  . Smoking status: Former Smoker -- 1.50 packs/day for 28 years    Types: Cigarettes    Quit date: 11/29/2001  . Smokeless tobacco: Never Used  . Alcohol Use: 0.0 oz/week     Comment: 4 drinks a week  . Drug Use: Yes    Special: Marijuana     Comment: 10-09-12- pt has quit  . Sexual Activity:    Partners: Male    Birth Control/ Protection: Post-menopausal   Other Topics Concern  . Not on file   Social History Narrative      Review of Systems  All other systems reviewed and are negative.      Objective:   Physical Exam  Constitutional: She is oriented to person, place, and time.  Cardiovascular: Normal rate, regular rhythm and normal heart sounds.   Pulmonary/Chest: Effort normal and breath sounds normal. No respiratory distress. She has no wheezes. She has no rales.  Musculoskeletal:       Cervical back: She exhibits normal range of motion, no tenderness, no bony tenderness, no pain and no spasm.  Neurological: She is alert and oriented to person, place, and time. She has normal reflexes. She displays normal reflexes. No cranial nerve deficit. She exhibits normal muscle tone. Coordination normal.  Vitals reviewed.         Assessment & Plan:

## 2014-10-30 ENCOUNTER — Ambulatory Visit
Admission: RE | Admit: 2014-10-30 | Discharge: 2014-10-30 | Disposition: A | Discharge status: Home / Self Care | Payer: Medicare Other | Source: Ambulatory Visit | Attending: Family Medicine | Admitting: Family Medicine

## 2014-10-30 ENCOUNTER — Other Ambulatory Visit: Payer: Self-pay | Admitting: Family Medicine

## 2014-10-30 DIAGNOSIS — I671 Cerebral aneurysm, nonruptured: Secondary | ICD-10-CM

## 2014-10-30 DIAGNOSIS — G4485 Primary stabbing headache: Secondary | ICD-10-CM

## 2014-10-30 DIAGNOSIS — S0990XA Unspecified injury of head, initial encounter: Secondary | ICD-10-CM | POA: Diagnosis not present

## 2014-10-30 DIAGNOSIS — R51 Headache: Secondary | ICD-10-CM | POA: Diagnosis not present

## 2014-11-27 ENCOUNTER — Ambulatory Visit (INDEPENDENT_AMBULATORY_CARE_PROVIDER_SITE_OTHER): Payer: Medicare Other | Admitting: Family Medicine

## 2014-11-27 ENCOUNTER — Encounter: Payer: Self-pay | Admitting: Family Medicine

## 2014-11-27 VITALS — BP 126/74 | HR 64 | Temp 97.6°F | Resp 16 | Ht 62.0 in | Wt 146.0 lb

## 2014-11-27 DIAGNOSIS — J029 Acute pharyngitis, unspecified: Secondary | ICD-10-CM | POA: Diagnosis not present

## 2014-11-27 MED ORDER — PANTOPRAZOLE SODIUM 40 MG PO TBEC: 40.0000 mg | DELAYED_RELEASE_TABLET | Freq: Every day | ORAL | Status: DC

## 2014-11-27 NOTE — Progress Notes (Signed)
Subjective:    Patient ID: [name], female    DOB: August 14, 1961, 54 y.o.   MRN: [id number]  HPI  patient presents today with a sore throat for 4 weeks. She denies any symptoms of illness. She denies any fever. She denies any rhinorrhea. She denies any postnasal drip. She denies any sinus pressure. She does report discomfort with swallowing. She also reports some mild hoarseness. She denies any cough or shortness of breath. She denies any symptoms of acid reflux. She thought it may be due to her Symbicort so she temporarily stopped the Symbicort. She has not seen any benefit since stopping Symbicort. She denies any weight loss. She denies any hemoptysis. She denies any hematemesis. She denies any dysphagia. Past Medical History  Diagnosis Date  . MRSA (methicillin resistant Staphylococcus aureus) carrier   . CA - skin cancer   . Crush injury of hand RIGHT IN 2007    X 6 SURG'S  LAST ONE BEING TOTAL WRIST FUSION W/ BONE GRAFT AND PLATE  . Chronic pain due to injury RIGHT HAND/WRIST IN 2007  . Depression   . PTSD (post-traumatic stress disorder) 2007 RIGHT HAND CRUSH INJURY W/ 2 FINGER AMPUTATION  . Stroke X2 CVA'S  IN 1990    LOSS OF VISION RIGHT EYE  . Weakness of right leg CAUSED BY RETRIEVAL THIGH MUSCLE FOR FLAP RIGHT HAND INJURY-- 2006    RIGHT LEG GIVES OUT OCCASIONALLY  . Anxiety     BILATERAL HANDS-- LEFT >RIGHT AND LEGS  . GERD (gastroesophageal reflux disease)     TAKES TUMS PRN  . Asthma     NO INHALER  . Arthritis   . Neuromuscular disorder     some fibromyalgia  . Blood transfusion without reported diagnosis   . Substance abuse     hx: narcotic use  . Hemorrhoids   . Bed bug bite    Past Surgical History  Procedure Laterality Date  . Skin cancer removal  2007    throat area  . Right total wrist fusion w/ bone graft and plate  [id number]    AND FLAP USING RIGHT GOIN MUSCLE  . Hand carpectomy  12-24-2005    RIGHT-- INCLUDING  REMOVAL  WRIST WIRES AND I &D  (EXICIOSNAL)  FOR OSTEOMYELITIS  . Orif radial fracture  09-04-05    DISTAL RADIAL PLATE AND PARTIAL THUMB AMPUTATION  . Incision / drainage hand / finger  X3  (2006 & 2007)    RIGHT CRUSH WOUND  . Orif carpal bone fracture  08-31-05    I & D OF RIGHT HAND CRUSH INJURY/ OPEN FX'S AND INDEX FINGER RAY RESECTION/ COMPLICATED CLOSURE  . Tubal ligation  15 YRS AGO  . Hysteroscopy w/d&c  08/18/2011    Procedure: DILATATION AND CURETTAGE (D&C) /HYSTEROSCOPY;  Surgeon: Anastasio Auerbach, MD;  Location: [place];  Service: Gynecology;  Laterality: N/A;  . Lesion removal  08/18/2011    Procedure: EXCISION VAGINAL LESION;  Surgeon: Anastasio Auerbach, MD;  Location: [place];  Service: Gynecology;  Laterality: N/A;  . Hysteroscopy  12.6.2012    w/removal of polyp  . Colon surgery      15 years ago- fistula on colon removed   Current Outpatient Prescriptions on File Prior to Visit  Medication Sig Dispense Refill  . albuterol (PROVENTIL HFA;VENTOLIN HFA) 108 (90 BASE) MCG/ACT inhaler Inhale 2 puffs into the lungs every 4 (four) hours as needed for wheezing or shortness of breath.  1 Inhaler 1  . gabapentin (NEURONTIN) 300 MG capsule Take 1 capsule (300 mg total) by mouth 3 (three) times daily. 90 capsule 3  . Multiple Vitamin (MULTIVITAMIN) tablet Take 1 tablet by mouth daily.    . pravastatin (PRAVACHOL) 40 MG tablet Take 1 tablet (40 mg total) by mouth daily. 90 tablet 3  . SYMBICORT 80-4.5 MCG/ACT inhaler INHALE 2 PUFFS INTO THE LUNGS 2 TIMES DAILY (Patient not taking: Reported on 11/27/2014) 10.2 g 3   No current facility-administered medications on file prior to visit.   Allergies  Allergen Reactions  . Aspirin Shortness Of Breath    ASTHMATIC ATTACK  . Codeine Other (See Comments)    ABNORMAL BEHAVIOR  Increased in bad moods    History   Social History  . Marital Status: Single    Spouse Name: N/A  . Number of Children: 6  . Years of Education: N/A     Occupational History  . Housekeeper     Social History Main Topics  . Smoking status: Former Smoker -- 1.50 packs/day for 28 years    Types: Cigarettes    Quit date: 11/29/2001  . Smokeless tobacco: Never Used  . Alcohol Use: 0.0 oz/week     Comment: 4 drinks a week  . Drug Use: Yes    Special: Marijuana     Comment: 10-09-12- pt has quit  . Sexual Activity:    Partners: Male    Birth Control/ Protection: Post-menopausal   Other Topics Concern  . Not on file   Social History Narrative      Review of Systems  All other systems reviewed and are negative.      Objective:   Physical Exam  Constitutional: She appears well-developed and well-nourished.  HENT:  Head: Normocephalic and atraumatic.  Right Ear: Tympanic membrane, external ear and ear canal normal.  Left Ear: Tympanic membrane, external ear and ear canal normal.  Nose: Nose normal.  Mouth/Throat: Uvula is midline, oropharynx is clear and moist and mucous membranes are normal. No oropharyngeal exudate.  Eyes: Conjunctivae are normal.  Neck: Neck supple. No JVD present. No thyromegaly present.  Cardiovascular: Normal rate and regular rhythm.   Pulmonary/Chest: Effort normal and breath sounds normal.  Lymphadenopathy:    She has no cervical adenopathy.  Vitals reviewed.         Assessment & Plan:  Sore throat - Plan: pantoprazole (PROTONIX) 40 MG tablet   Differential diagnosis includes postnasal drip, laryngo-esophageal reflux, irritation from inhaled steroid,. Patient is worried about some type of laryngeal cancer or cancer on her vocal cords given the fact that many people in her family have recently been diagnosed with cancer. I do not believe that this is infectious given the chronicity of her symptoms. I will treat the patient with Protonix 40 mg by mouth daily for possible laryngo-esophageal reflux. I will also give the patient due to Magic mouthwash 1 teaspoon gargle and swallow every 6 hours for  the next week. Recheck in 2 weeks. If symptoms have not improved, I will refer the patient to ear nose and throat for laryngoscopy.

## 2014-12-11 ENCOUNTER — Telehealth: Payer: Self-pay | Admitting: Family Medicine

## 2014-12-11 DIAGNOSIS — J312 Chronic pharyngitis: Secondary | ICD-10-CM

## 2014-12-11 NOTE — Telephone Encounter (Signed)
Patient is calling to say that dr pickard told her if she was not better by now he would refer her to ent, she needs that referral if possible  [phone]

## 2014-12-11 NOTE — Telephone Encounter (Signed)
Pt states is not better still has same symptoms as when she was here on 03/17, requesting ENT referral  ?ok to place referral?

## 2014-12-11 NOTE — Telephone Encounter (Signed)
Ok with ent referral for chronic sore throat.

## 2014-12-11 NOTE — Telephone Encounter (Signed)
referrral placed

## 2014-12-18 DIAGNOSIS — J387 Other diseases of larynx: Secondary | ICD-10-CM | POA: Diagnosis not present

## 2014-12-18 DIAGNOSIS — F458 Other somatoform disorders: Secondary | ICD-10-CM | POA: Diagnosis not present

## 2015-02-19 DIAGNOSIS — J387 Other diseases of larynx: Secondary | ICD-10-CM | POA: Diagnosis not present

## 2015-02-19 DIAGNOSIS — F458 Other somatoform disorders: Secondary | ICD-10-CM | POA: Diagnosis not present

## 2015-03-03 DIAGNOSIS — L570 Actinic keratosis: Secondary | ICD-10-CM | POA: Diagnosis not present

## 2015-03-03 DIAGNOSIS — L821 Other seborrheic keratosis: Secondary | ICD-10-CM | POA: Diagnosis not present

## 2015-03-03 DIAGNOSIS — L720 Epidermal cyst: Secondary | ICD-10-CM | POA: Diagnosis not present

## 2015-03-03 DIAGNOSIS — Z85828 Personal history of other malignant neoplasm of skin: Secondary | ICD-10-CM | POA: Diagnosis not present

## 2015-03-25 ENCOUNTER — Other Ambulatory Visit: Payer: Self-pay | Admitting: Family Medicine

## 2015-03-25 NOTE — Telephone Encounter (Signed)
Medication refilled per protocol. 

## 2015-05-01 ENCOUNTER — Ambulatory Visit (INDEPENDENT_AMBULATORY_CARE_PROVIDER_SITE_OTHER): Payer: Medicare Other | Admitting: Family Medicine

## 2015-05-01 ENCOUNTER — Encounter: Payer: Self-pay | Admitting: Family Medicine

## 2015-05-01 VITALS — BP 120/90 | HR 78 | Temp 98.1°F | Resp 16 | Ht 62.0 in | Wt 144.0 lb

## 2015-05-01 DIAGNOSIS — Z0000 Encounter for general adult medical examination without abnormal findings: Secondary | ICD-10-CM | POA: Diagnosis not present

## 2015-05-01 NOTE — Progress Notes (Signed)
Subjective:    Patient ID: [name], female    DOB: 1961/03/30, 54 y.o.   MRN: [id number]  HPI  Patient is a 54 year old white female who is here today for complete physical exam.  She is overdue to see her gynecologist for her Pap smear.  Her mammogram is due in September. Her last mammogram from September 2015 was normal. Colonoscopy was performed 2014. There is no evidence of cancer.  Overall she is doing well. She denies any complaints. She does have a history of remote stroke. She is not taking an aspirin due to an allergy to aspirin. She discontinued her cholesterol medication again on her own because she thought it was affecting her memory.  .  Past Medical History  Diagnosis Date  . MRSA (methicillin resistant Staphylococcus aureus) carrier   . CA - skin cancer   . Crush injury of hand RIGHT IN 2007    X 6 SURG'S  LAST ONE BEING TOTAL WRIST FUSION W/ BONE GRAFT AND PLATE  . Chronic pain due to injury RIGHT HAND/WRIST IN 2007  . Depression   . PTSD (post-traumatic stress disorder) 2007 RIGHT HAND CRUSH INJURY W/ 2 FINGER AMPUTATION  . Stroke X2 CVA'S  IN 1990    LOSS OF VISION RIGHT EYE  . Weakness of right leg CAUSED BY RETRIEVAL THIGH MUSCLE FOR FLAP RIGHT HAND INJURY-- 2006    RIGHT LEG GIVES OUT OCCASIONALLY  . Anxiety     BILATERAL HANDS-- LEFT >RIGHT AND LEGS  . GERD (gastroesophageal reflux disease)     TAKES TUMS PRN  . Asthma     NO INHALER  . Arthritis   . Neuromuscular disorder     some fibromyalgia  . Blood transfusion without reported diagnosis   . Substance abuse     hx: narcotic use  . Hemorrhoids   . Bed bug bite    Past Surgical History  Procedure Laterality Date  . Skin cancer removal  2007    throat area  . Right total wrist fusion w/ bone graft and plate  [id number]    AND FLAP USING RIGHT GOIN MUSCLE  . Hand carpectomy  12-24-2005    RIGHT-- INCLUDING  REMOVAL  WRIST WIRES AND I &D (EXICIOSNAL)  FOR OSTEOMYELITIS  . Orif radial  fracture  09-04-05    DISTAL RADIAL PLATE AND PARTIAL THUMB AMPUTATION  . Incision / drainage hand / finger  X3  (2006 & 2007)    RIGHT CRUSH WOUND  . Orif carpal bone fracture  08-31-05    I & D OF RIGHT HAND CRUSH INJURY/ OPEN FX'S AND INDEX FINGER RAY RESECTION/ COMPLICATED CLOSURE  . Tubal ligation  15 YRS AGO  . Hysteroscopy w/d&c  08/18/2011    Procedure: DILATATION AND CURETTAGE (D&C) /HYSTEROSCOPY;  Surgeon: Anastasio Auerbach, MD;  Location: [facility];  Service: Gynecology;  Laterality: N/A;  . Lesion removal  08/18/2011    Procedure: EXCISION VAGINAL LESION;  Surgeon: Anastasio Auerbach, MD;  Location: [facility] [facility];  Service: Gynecology;  Laterality: N/A;  . Hysteroscopy  12.6.2012    w/removal of polyp  . Colon surgery      15 years ago- fistula on colon removed   Current Outpatient Prescriptions on File Prior to Visit  Medication Sig Dispense Refill  . albuterol (PROVENTIL HFA;VENTOLIN HFA) 108 (90 BASE) MCG/ACT inhaler Inhale 2 puffs into the lungs every 4 (four) hours as needed for wheezing or shortness of breath. 1  Inhaler 1  . gabapentin (NEURONTIN) 300 MG capsule Take 1 capsule (300 mg total) by mouth 3 (three) times daily. 90 capsule 3   No current facility-administered medications on file prior to visit.   Allergies  Allergen Reactions  . Aspirin Shortness Of Breath    ASTHMATIC ATTACK  . Codeine Other (See Comments)    ABNORMAL BEHAVIOR  Increased in bad moods    Social History   Social History  . Marital Status: Single    Spouse Name: N/A  . Number of Children: 6  . Years of Education: N/A   Occupational History  . Housekeeper     Social History Main Topics  . Smoking status: Former Smoker -- 1.50 packs/day for 28 years    Types: Cigarettes    Quit date: 11/29/2001  . Smokeless tobacco: Never Used  . Alcohol Use: 0.0 oz/week     Comment: 4 drinks a week  . Drug Use: Yes    Special: Marijuana     Comment: 10-09-12- pt  has quit  . Sexual Activity:    Partners: Male    Birth Control/ Protection: Post-menopausal   Other Topics Concern  . Not on file   Social History Narrative   Family History  Problem Relation Age of Onset  . Lupus Mother     died at 71  . Breast cancer Maternal Aunt   . Breast cancer Maternal Grandmother   . Stomach cancer Paternal Grandfather   . Colon cancer Neg Hx   . Rectal cancer Neg Hx   . Esophageal cancer Neg Hx       Review of Systems  All other systems reviewed and are negative.      Objective:   Physical Exam  Constitutional: She is oriented to person, place, and time. She appears well-developed and well-nourished. No distress.  HENT:  Head: Normocephalic and atraumatic.  Right Ear: External ear normal.  Left Ear: External ear normal.  Nose: Nose normal.  Mouth/Throat: Oropharynx is clear and moist. No oropharyngeal exudate.  Eyes: Conjunctivae and EOM are normal. Pupils are equal, round, and reactive to light. Right eye exhibits no discharge. Left eye exhibits no discharge. No scleral icterus.  Neck: Normal range of motion. Neck supple. No JVD present. No tracheal deviation present. No thyromegaly present.  Cardiovascular: Normal rate, regular rhythm, normal heart sounds and intact distal pulses.  Exam reveals no gallop and no friction rub.   No murmur heard. Pulmonary/Chest: Effort normal and breath sounds normal. No stridor. No respiratory distress. She has no wheezes. She has no rales. She exhibits no tenderness.  Abdominal: Soft. Bowel sounds are normal. She exhibits no distension and no mass. There is no tenderness. There is no rebound and no guarding.  Musculoskeletal: Normal range of motion. She exhibits no edema or tenderness.  Lymphadenopathy:    She has no cervical adenopathy.  Neurological: She is alert and oriented to person, place, and time. She has normal reflexes. No cranial nerve deficit. She exhibits normal muscle tone. Coordination normal.   Skin: Skin is warm. No rash noted. She is not diaphoretic. No erythema. No pallor.  Psychiatric: She has a normal mood and affect. Her behavior is normal. Judgment and thought content normal.  Vitals reviewed.  patient's right hand and right forearm are disfigured from a remote industrial accident. This is chronic.        Assessment & Plan:  Routine general medical examination at a health care facility  Patient will schedule her  own mammogram. Colonoscopy and Pap smear up-to-date. I would like her to return fasting for a CBC, CMP, fasting lipid panel, and a TSH. Given her history of COPD and asthma, I would give the patient Pneumovax 23 today. She quit taking Symbicort due to sore throat. I will start the patient on Dulera 200/5 2 puffs inhaled twice a day as she has reported increasing shortness of breath and wheezing over the hot summer

## 2015-05-06 ENCOUNTER — Other Ambulatory Visit: Payer: Medicare Other

## 2015-05-06 DIAGNOSIS — J4521 Mild intermittent asthma with (acute) exacerbation: Secondary | ICD-10-CM | POA: Diagnosis not present

## 2015-05-06 DIAGNOSIS — Z0000 Encounter for general adult medical examination without abnormal findings: Secondary | ICD-10-CM

## 2015-05-06 DIAGNOSIS — F418 Other specified anxiety disorders: Secondary | ICD-10-CM | POA: Diagnosis not present

## 2015-05-06 DIAGNOSIS — Z79899 Other long term (current) drug therapy: Secondary | ICD-10-CM | POA: Diagnosis not present

## 2015-05-06 LAB — TSH: TSH: 1.38 u[IU]/mL (ref 0.350–4.500)

## 2015-05-06 LAB — CBC WITH DIFFERENTIAL/PLATELET
BASOS ABS: 0.1 10*3/uL (ref 0.0–0.1)
BASOS PCT: 1 % (ref 0–1)
EOS ABS: 0.2 10*3/uL (ref 0.0–0.7)
EOS PCT: 3 % (ref 0–5)
HCT: 39.7 % (ref 36.0–46.0)
Hemoglobin: 12.8 g/dL (ref 12.0–15.0)
LYMPHS ABS: 2.4 10*3/uL (ref 0.7–4.0)
Lymphocytes Relative: 34 % (ref 12–46)
MCH: 30.5 pg (ref 26.0–34.0)
MCHC: 32.2 g/dL (ref 30.0–36.0)
MCV: 94.7 fL (ref 78.0–100.0)
MPV: 10 fL (ref 8.6–12.4)
Monocytes Absolute: 0.6 10*3/uL (ref 0.1–1.0)
Monocytes Relative: 9 % (ref 3–12)
NEUTROS PCT: 53 % (ref 43–77)
Neutro Abs: 3.8 10*3/uL (ref 1.7–7.7)
PLATELETS: 315 10*3/uL (ref 150–400)
RBC: 4.19 MIL/uL (ref 3.87–5.11)
RDW: 13.4 % (ref 11.5–15.5)
WBC: 7.1 10*3/uL (ref 4.0–10.5)

## 2015-05-06 LAB — COMPLETE METABOLIC PANEL WITH GFR
ALK PHOS: 56 U/L (ref 33–130)
ALT: 14 U/L (ref 6–29)
AST: 20 U/L (ref 10–35)
Albumin: 4.3 g/dL (ref 3.6–5.1)
BILIRUBIN TOTAL: 0.5 mg/dL (ref 0.2–1.2)
BUN: 18 mg/dL (ref 7–25)
CO2: 28 mmol/L (ref 20–31)
Calcium: 9.7 mg/dL (ref 8.6–10.4)
Chloride: 104 mmol/L (ref 98–110)
Creat: 0.69 mg/dL (ref 0.50–1.05)
GFR, Est Non African American: >89 mL/min (ref >=60)
Glucose, Bld: 71 mg/dL (ref 70–99)
Potassium: 4.2 mmol/L (ref 3.5–5.3)
Sodium: 142 mmol/L (ref 135–146)
TOTAL PROTEIN: 6.7 g/dL (ref 6.1–8.1)

## 2015-05-06 LAB — LIPID PANEL
CHOL/HDL RATIO: 3.4 ratio (ref <=5.0)
CHOLESTEROL: 209 mg/dL — AB (ref 125–200)
HDL: 62 mg/dL (ref >=46)
LDL CALC: 124 mg/dL (ref <130)
Triglycerides: 114 mg/dL (ref <150)
VLDL: 23 mg/dL (ref <30)

## 2015-05-07 ENCOUNTER — Encounter: Payer: Self-pay | Admitting: Family Medicine

## 2015-05-15 ENCOUNTER — Ambulatory Visit (INDEPENDENT_AMBULATORY_CARE_PROVIDER_SITE_OTHER): Payer: Medicare Other | Admitting: Family Medicine

## 2015-05-15 ENCOUNTER — Encounter: Payer: Self-pay | Admitting: Family Medicine

## 2015-05-15 VITALS — BP 130/92 | HR 78 | Temp 98.0°F | Resp 16 | Ht 62.0 in | Wt 143.0 lb

## 2015-05-15 DIAGNOSIS — J449 Chronic obstructive pulmonary disease, unspecified: Secondary | ICD-10-CM

## 2015-05-15 DIAGNOSIS — E785 Hyperlipidemia, unspecified: Secondary | ICD-10-CM | POA: Diagnosis not present
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
## 2015-05-28 ENCOUNTER — Other Ambulatory Visit: Payer: Self-pay | Admitting: Family Medicine

## 2015-05-28 ENCOUNTER — Telehealth: Payer: Self-pay | Admitting: *Deleted

## 2015-05-28 MED ORDER — ACLIDINIUM BROMIDE 400 MCG/ACT IN AEPB: 1.0000 | INHALATION_SPRAY | Freq: Two times a day (BID) | RESPIRATORY_TRACT | Status: DC

## 2015-05-28 NOTE — Telephone Encounter (Signed)
Received request from pharmacy for [place] on Tudorza.  PA submitted.   Dx: COPD (J44.9)

## 2015-06-01 NOTE — Telephone Encounter (Signed)
Received PA determination.  ° °PA approved.  ° °Pharmacy made aware.  °

## 2015-06-02 ENCOUNTER — Other Ambulatory Visit: Payer: Self-pay

## 2015-06-02 DIAGNOSIS — Z1231 Encounter for screening mammogram for malignant neoplasm of breast: Secondary | ICD-10-CM

## 2015-06-05 ENCOUNTER — Telehealth: Payer: Self-pay | Admitting: *Deleted

## 2015-06-05 ENCOUNTER — Ambulatory Visit
Admission: RE | Admit: 2015-06-05 | Discharge: 2015-06-05 | Disposition: A | Discharge status: Home / Self Care | Payer: Medicare Other | Source: Ambulatory Visit

## 2015-06-05 DIAGNOSIS — Z1231 Encounter for screening mammogram for malignant neoplasm of breast: Secondary | ICD-10-CM

## 2015-06-05 MED ORDER — ACLIDINIUM BROMIDE 400 MCG/ACT IN AEPB
1.0000 | INHALATION_SPRAY | Freq: Two times a day (BID) | RESPIRATORY_TRACT | Status: DC
Start: 2015-06-05 — End: 2015-06-10

## 2015-06-05 NOTE — Telephone Encounter (Signed)
Received fax requesting refill on Tudorza.  Refill appropriate and filled per protocol.

## 2015-06-10 ENCOUNTER — Other Ambulatory Visit: Payer: Self-pay | Admitting: Family Medicine

## 2015-06-10 MED ORDER — ACLIDINIUM BROMIDE 400 MCG/ACT IN AEPB: 1.0000 | INHALATION_SPRAY | Freq: Two times a day (BID) | RESPIRATORY_TRACT | Status: DC

## 2015-06-11 ENCOUNTER — Other Ambulatory Visit: Payer: Self-pay | Admitting: *Deleted

## 2015-06-11 MED ORDER — ACLIDINIUM BROMIDE 400 MCG/ACT IN AEPB
1.0000 | INHALATION_SPRAY | Freq: Two times a day (BID) | RESPIRATORY_TRACT | Status: DC
Start: 2015-06-11 — End: 2016-02-02

## 2015-07-29 DIAGNOSIS — L820 Inflamed seborrheic keratosis: Secondary | ICD-10-CM | POA: Diagnosis not present

## 2015-07-29 DIAGNOSIS — L570 Actinic keratosis: Secondary | ICD-10-CM | POA: Diagnosis not present

## 2015-10-06 ENCOUNTER — Encounter: Payer: Self-pay | Admitting: Family Medicine

## 2015-10-06 ENCOUNTER — Ambulatory Visit (INDEPENDENT_AMBULATORY_CARE_PROVIDER_SITE_OTHER): Payer: Medicare Other | Admitting: Family Medicine

## 2015-10-06 VITALS — BP 120/80 | HR 80 | Temp 98.3°F | Resp 18 | Wt 146.0 lb

## 2015-10-06 DIAGNOSIS — D485 Neoplasm of uncertain behavior of skin: Secondary | ICD-10-CM | POA: Diagnosis not present

## 2015-10-06 DIAGNOSIS — M79642 Pain in left hand: Secondary | ICD-10-CM | POA: Diagnosis not present

## 2015-10-06 MED ORDER — MELOXICAM 15 MG PO TABS: 15.0000 mg | ORAL_TABLET | Freq: Every day | ORAL | Status: DC

## 2015-10-06 MED ORDER — UMECLIDINIUM BROMIDE 62.5 MCG/INH IN AEPB: 1.0000 | INHALATION_SPRAY | Freq: Every day | RESPIRATORY_TRACT | Status: DC
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
## 2015-11-18 ENCOUNTER — Other Ambulatory Visit: Payer: Self-pay | Admitting: Family Medicine

## 2015-11-18 ENCOUNTER — Other Ambulatory Visit: Payer: Self-pay | Admitting: Gastroenterology

## 2015-11-18 NOTE — Telephone Encounter (Signed)
Medication refilled per protocol. 

## 2015-11-19 ENCOUNTER — Telehealth: Payer: Self-pay | Admitting: Family Medicine

## 2015-11-19 NOTE — Telephone Encounter (Signed)
Pt is requesting that we call in a prescription for Canasa 1,000 mg suppositories to [facility] on [address] [place]. She says that the doctor that was prescribing it has stopped practicing.

## 2015-11-19 NOTE — Telephone Encounter (Signed)
?   OK to Refill  

## 2015-11-19 NOTE — Telephone Encounter (Signed)
ok 

## 2015-11-20 MED ORDER — MESALAMINE 1000 MG RE SUPP: 1000.0000 mg | Freq: Every day | RECTAL | Status: DC

## 2015-11-20 NOTE — Telephone Encounter (Signed)
Medication called/sent to requested pharmacy  

## 2015-11-23 ENCOUNTER — Emergency Department (HOSPITAL_COMMUNITY)
Admission: EM | Admit: 2015-11-23 | Discharge: 2015-11-23 | Disposition: A | Discharge status: Home / Self Care | Payer: Medicare Other | Attending: Emergency Medicine | Admitting: Emergency Medicine

## 2015-11-23 ENCOUNTER — Encounter (HOSPITAL_COMMUNITY): Payer: Self-pay

## 2015-11-23 DIAGNOSIS — Z791 Long term (current) use of non-steroidal anti-inflammatories (NSAID): Secondary | ICD-10-CM | POA: Diagnosis not present

## 2015-11-23 DIAGNOSIS — F10929 Alcohol use, unspecified with intoxication, unspecified: Secondary | ICD-10-CM | POA: Diagnosis not present

## 2015-11-23 DIAGNOSIS — F1010 Alcohol abuse, uncomplicated: Secondary | ICD-10-CM | POA: Insufficient documentation

## 2015-11-23 DIAGNOSIS — F329 Major depressive disorder, single episode, unspecified: Secondary | ICD-10-CM | POA: Diagnosis not present

## 2015-11-23 DIAGNOSIS — K219 Gastro-esophageal reflux disease without esophagitis: Secondary | ICD-10-CM | POA: Insufficient documentation

## 2015-11-23 DIAGNOSIS — Z87828 Personal history of other (healed) physical injury and trauma: Secondary | ICD-10-CM | POA: Diagnosis not present

## 2015-11-23 DIAGNOSIS — M1990 Unspecified osteoarthritis, unspecified site: Secondary | ICD-10-CM | POA: Insufficient documentation

## 2015-11-23 DIAGNOSIS — Z85828 Personal history of other malignant neoplasm of skin: Secondary | ICD-10-CM | POA: Insufficient documentation

## 2015-11-23 DIAGNOSIS — Z046 Encounter for general psychiatric examination, requested by authority: Secondary | ICD-10-CM | POA: Insufficient documentation

## 2015-11-23 DIAGNOSIS — F10120 Alcohol abuse with intoxication, uncomplicated: Secondary | ICD-10-CM | POA: Diagnosis not present

## 2015-11-23 DIAGNOSIS — F419 Anxiety disorder, unspecified: Secondary | ICD-10-CM | POA: Insufficient documentation

## 2015-11-23 DIAGNOSIS — Z89021 Acquired absence of right finger(s): Secondary | ICD-10-CM | POA: Insufficient documentation

## 2015-11-23 DIAGNOSIS — Z0289 Encounter for other administrative examinations: Secondary | ICD-10-CM | POA: Diagnosis not present

## 2015-11-23 DIAGNOSIS — Z8673 Personal history of transient ischemic attack (TIA), and cerebral infarction without residual deficits: Secondary | ICD-10-CM | POA: Diagnosis not present

## 2015-11-23 DIAGNOSIS — J45909 Unspecified asthma, uncomplicated: Secondary | ICD-10-CM | POA: Insufficient documentation

## 2015-11-23 DIAGNOSIS — Z79899 Other long term (current) drug therapy: Secondary | ICD-10-CM | POA: Diagnosis not present

## 2015-11-23 DIAGNOSIS — F10129 Alcohol abuse with intoxication, unspecified: Secondary | ICD-10-CM | POA: Diagnosis not present

## 2015-11-23 DIAGNOSIS — Z8614 Personal history of Methicillin resistant Staphylococcus aureus infection: Secondary | ICD-10-CM | POA: Diagnosis not present

## 2015-11-23 DIAGNOSIS — G8929 Other chronic pain: Secondary | ICD-10-CM | POA: Diagnosis not present

## 2015-11-23 DIAGNOSIS — Z87891 Personal history of nicotine dependence: Secondary | ICD-10-CM | POA: Insufficient documentation

## 2015-11-23 DIAGNOSIS — F911 Conduct disorder, childhood-onset type: Secondary | ICD-10-CM | POA: Insufficient documentation

## 2015-11-23 DIAGNOSIS — Z008 Encounter for other general examination: Secondary | ICD-10-CM

## 2015-11-23 DIAGNOSIS — Y908 Blood alcohol level of 240 mg/100 ml or more: Secondary | ICD-10-CM | POA: Diagnosis not present

## 2015-11-23 HISTORY — DX: Alcohol abuse, uncomplicated: F10.10

## 2015-11-23 LAB — COMPREHENSIVE METABOLIC PANEL
ALBUMIN: 4.8 g/dL (ref 3.5–5.0)
ALK PHOS: 58 U/L (ref 38–126)
ALT: 23 U/L (ref 14–54)
AST: 27 U/L (ref 15–41)
Anion gap: 11 (ref 5–15)
BILIRUBIN TOTAL: 0.3 mg/dL (ref 0.3–1.2)
BUN: 12 mg/dL (ref 6–20)
CALCIUM: 9.5 mg/dL (ref 8.9–10.3)
CO2: 22 mmol/L (ref 22–32)
CREATININE: 0.6 mg/dL (ref 0.44–1.00)
Chloride: 114 mmol/L — ABNORMAL HIGH (ref 101–111)
GFR calc Af Amer: >60 mL/min (ref >60)
GFR calc non Af Amer: >60 mL/min (ref >60)
GLUCOSE: 114 mg/dL — AB (ref 65–99)
Potassium: 3.7 mmol/L (ref 3.5–5.1)
Sodium: 147 mmol/L — ABNORMAL HIGH (ref 135–145)
TOTAL PROTEIN: 7.8 g/dL (ref 6.5–8.1)

## 2015-11-23 LAB — CBC WITH DIFFERENTIAL/PLATELET
Basophils Absolute: 0 10*3/uL (ref 0.0–0.1)
Basophils Relative: 1 %
EOS PCT: 4 %
Eosinophils Absolute: 0.3 10*3/uL (ref 0.0–0.7)
HCT: 40.2 % (ref 36.0–46.0)
HEMOGLOBIN: 14 g/dL (ref 12.0–15.0)
LYMPHS ABS: 3.4 10*3/uL (ref 0.7–4.0)
LYMPHS PCT: 48 %
MCH: 32.3 pg (ref 26.0–34.0)
MCHC: 34.8 g/dL (ref 30.0–36.0)
MCV: 92.6 fL (ref 78.0–100.0)
MONOS PCT: 6 %
Monocytes Absolute: 0.4 10*3/uL (ref 0.1–1.0)
Neutro Abs: 2.9 10*3/uL (ref 1.7–7.7)
Neutrophils Relative %: 41 %
PLATELETS: 304 10*3/uL (ref 150–400)
RBC: 4.34 MIL/uL (ref 3.87–5.11)
RDW: 12.6 % (ref 11.5–15.5)
WBC: 7 10*3/uL (ref 4.0–10.5)

## 2015-11-23 LAB — RAPID URINE DRUG SCREEN, HOSP PERFORMED
AMPHETAMINES: NOT DETECTED
Barbiturates: NOT DETECTED
Benzodiazepines: NOT DETECTED
Cocaine: NOT DETECTED
Opiates: NOT DETECTED
TETRAHYDROCANNABINOL: NOT DETECTED

## 2015-11-23 LAB — ETHANOL: Alcohol, Ethyl (B): 276 mg/dL — ABNORMAL HIGH (ref <5)
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
## 2015-11-23 NOTE — Discharge Instructions (Signed)
Alcohol Intoxication  Alcohol intoxication occurs when the amount of alcohol that a person has consumed impairs his or her ability to mentally and physically function. Alcohol directly impairs the normal chemical activity of the brain. Drinking large amounts of alcohol can lead to changes in mental function and behavior, and it can cause many physical effects that can be harmful.   Alcohol intoxication can range in severity from mild to very severe. Various factors can affect the level of intoxication that occurs, such as the person's age, gender, weight, frequency of alcohol consumption, and the presence of other medical conditions (such as diabetes, seizures, or heart conditions). Dangerous levels of alcohol intoxication may occur when people drink large amounts of alcohol in a short period (binge drinking). Alcohol can also be especially dangerous when combined with certain prescription medicines or "recreational" drugs.  SIGNS AND SYMPTOMS  Some common signs and symptoms of mild alcohol intoxication include:  · Loss of coordination.  · Changes in mood and behavior.  · Impaired judgment.  · Slurred speech.  As alcohol intoxication progresses to more severe levels, other signs and symptoms will appear. These may include:  · Vomiting.  · Confusion and impaired memory.  · Slowed breathing.  · Seizures.  · Loss of consciousness.  DIAGNOSIS   Your health care provider will take a medical history and perform a physical exam. You will be asked about the amount and type of alcohol you have consumed. Blood tests will be done to measure the concentration of alcohol in your blood. In many places, your blood alcohol level must be lower than 80 mg/dL (0.08%) to legally drive. However, many dangerous effects of alcohol can occur at much lower levels.   TREATMENT   People with alcohol intoxication often do not require treatment. Most of the effects of alcohol intoxication are temporary, and they go away as the alcohol naturally  leaves the body. Your health care provider will monitor your condition until you are stable enough to go home. Fluids are sometimes given through an IV access tube to help prevent dehydration.   HOME CARE INSTRUCTIONS  · Do not drive after drinking alcohol.  · Stay hydrated. Drink enough water and fluids to keep your urine clear or pale yellow. Avoid caffeine.    · Only take over-the-counter or prescription medicines as directed by your health care provider.    SEEK MEDICAL CARE IF:   · You have persistent vomiting.    · You do not feel better after a few days.  · You have frequent alcohol intoxication. Your health care provider can help determine if you should see a substance use treatment counselor.  SEEK IMMEDIATE MEDICAL CARE IF:   · You become shaky or tremble when you try to stop drinking.    · You shake uncontrollably (seizure).    · You throw up (vomit) blood. This may be bright red or may look like black coffee grounds.    · You have blood in your stool. This may be bright red or may appear as a black, tarry, bad smelling stool.    · You become lightheaded or faint.    MAKE SURE YOU:   · Understand these instructions.  · Will watch your condition.  · Will get help right away if you are not doing well or get worse.     This information is not intended to replace advice given to you by your health care provider. Make sure you discuss any questions you have with your health care provider.       Document Released: 06/08/2005 Document Revised: 05/01/2013 Document Reviewed: 02/01/2013  Elsevier Interactive Patient Education ©2016 [employer]
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
## 2016-01-07 DIAGNOSIS — H52223 Regular astigmatism, bilateral: Secondary | ICD-10-CM | POA: Diagnosis not present

## 2016-01-18 DIAGNOSIS — F1020 Alcohol dependence, uncomplicated: Secondary | ICD-10-CM | POA: Diagnosis not present

## 2016-01-25 DIAGNOSIS — F1020 Alcohol dependence, uncomplicated: Secondary | ICD-10-CM | POA: Diagnosis not present

## 2016-02-01 DIAGNOSIS — F1020 Alcohol dependence, uncomplicated: Secondary | ICD-10-CM | POA: Diagnosis not present

## 2016-02-02 ENCOUNTER — Encounter: Payer: Self-pay | Admitting: Family Medicine

## 2016-02-02 ENCOUNTER — Ambulatory Visit (INDEPENDENT_AMBULATORY_CARE_PROVIDER_SITE_OTHER): Payer: Medicare Other | Admitting: Family Medicine

## 2016-02-02 VITALS — BP 132/90 | HR 78 | Temp 98.0°F | Resp 16 | Ht 62.0 in | Wt 141.0 lb

## 2016-02-02 DIAGNOSIS — R7309 Other abnormal glucose: Secondary | ICD-10-CM | POA: Diagnosis not present

## 2016-02-02 DIAGNOSIS — G629 Polyneuropathy, unspecified: Secondary | ICD-10-CM

## 2016-02-02 DIAGNOSIS — E538 Deficiency of other specified B group vitamins: Secondary | ICD-10-CM | POA: Diagnosis not present

## 2016-02-02 MED ORDER — TRAMADOL HCL 50 MG PO TABS: 50.0000 mg | ORAL_TABLET | Freq: Four times a day (QID) | ORAL | Status: DC | PRN

## 2016-02-02 MED ORDER — PREGABALIN 100 MG PO CAPS: 100.0000 mg | ORAL_CAPSULE | Freq: Three times a day (TID) | ORAL | Status: DC
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
## 2016-02-03 LAB — CBC WITH DIFFERENTIAL/PLATELET
BASOS ABS: 91 {cells}/uL (ref 0–200)
Basophils Relative: 1 %
Eosinophils Absolute: 273 cells/uL (ref 15–500)
Eosinophils Relative: 3 %
HEMATOCRIT: 40 % (ref 35.0–45.0)
HEMOGLOBIN: 13.2 g/dL (ref 12.0–15.0)
LYMPHS ABS: 3367 {cells}/uL (ref 850–3900)
Lymphocytes Relative: 37 %
MCH: 30.8 pg (ref 27.0–33.0)
MCHC: 33 g/dL (ref 32.0–36.0)
MCV: 93.5 fL (ref 80.0–100.0)
MONO ABS: 728 {cells}/uL (ref 200–950)
MPV: 10.5 fL (ref 7.5–12.5)
Monocytes Relative: 8 %
NEUTROS PCT: 51 %
Neutro Abs: 4641 cells/uL (ref 1500–7800)
Platelets: 352 10*3/uL (ref 140–400)
RBC: 4.28 MIL/uL (ref 3.80–5.10)
RDW: 12.7 % (ref 11.0–15.0)
WBC: 9.1 10*3/uL (ref 3.8–10.8)

## 2016-02-03 LAB — COMPLETE METABOLIC PANEL WITH GFR
ALBUMIN: 4.2 g/dL (ref 3.6–5.1)
ALK PHOS: 60 U/L (ref 33–130)
ALT: 12 U/L (ref 6–29)
AST: 18 U/L (ref 10–35)
BILIRUBIN TOTAL: 0.2 mg/dL (ref 0.2–1.2)
BUN: 17 mg/dL (ref 7–25)
CALCIUM: 10.2 mg/dL (ref 8.6–10.4)
CO2: 27 mmol/L (ref 20–31)
Chloride: 105 mmol/L (ref 98–110)
Creat: 0.85 mg/dL (ref 0.50–1.05)
GFR, Est African American: 89 mL/min (ref >=60)
GFR, Est Non African American: 77 mL/min (ref >=60)
Glucose, Bld: 81 mg/dL (ref 70–99)
POTASSIUM: 4.2 mmol/L (ref 3.5–5.3)
Sodium: 141 mmol/L (ref 135–146)
TOTAL PROTEIN: 6.6 g/dL (ref 6.1–8.1)

## 2016-02-03 LAB — HEMOGLOBIN A1C
Hgb A1c MFr Bld: 5.5 % (ref <5.7)
Mean Plasma Glucose: 111 mg/dL

## 2016-02-03 LAB — VITAMIN B12: VITAMIN B 12: 573 pg/mL (ref 200–1100)

## 2016-02-03 LAB — TSH: TSH: 1.39 m[IU]/L

## 2016-02-04 ENCOUNTER — Encounter: Payer: Self-pay | Admitting: Family Medicine

## 2016-02-08 DIAGNOSIS — F1020 Alcohol dependence, uncomplicated: Secondary | ICD-10-CM | POA: Diagnosis not present

## 2016-02-15 DIAGNOSIS — F1020 Alcohol dependence, uncomplicated: Secondary | ICD-10-CM | POA: Diagnosis not present

## 2016-02-19 ENCOUNTER — Telehealth: Payer: Self-pay | Admitting: Family Medicine

## 2016-02-19 NOTE — Telephone Encounter (Signed)
Patient requires OV 

## 2016-02-19 NOTE — Telephone Encounter (Signed)
Pt is having a lot of pain in her hands and she states that the Lyrica is not helping her. She is asking if there is anything that  Dr. Dennard Schaumann can call in to help. [place]

## 2016-02-22 DIAGNOSIS — F1020 Alcohol dependence, uncomplicated: Secondary | ICD-10-CM | POA: Diagnosis not present

## 2016-02-26 ENCOUNTER — Other Ambulatory Visit: Payer: Self-pay | Admitting: Family Medicine

## 2016-02-26 DIAGNOSIS — G629 Polyneuropathy, unspecified: Secondary | ICD-10-CM

## 2016-02-27 ENCOUNTER — Encounter (HOSPITAL_COMMUNITY): Payer: Self-pay | Admitting: Emergency Medicine

## 2016-02-27 ENCOUNTER — Emergency Department (HOSPITAL_COMMUNITY)
Admission: EM | Admit: 2016-02-27 | Discharge: 2016-02-27 | Disposition: A | Discharge status: Home / Self Care | Payer: Medicare Other | Attending: Emergency Medicine | Admitting: Emergency Medicine

## 2016-02-27 DIAGNOSIS — J45909 Unspecified asthma, uncomplicated: Secondary | ICD-10-CM | POA: Diagnosis not present

## 2016-02-27 DIAGNOSIS — Z87891 Personal history of nicotine dependence: Secondary | ICD-10-CM | POA: Diagnosis not present

## 2016-02-27 DIAGNOSIS — Z79899 Other long term (current) drug therapy: Secondary | ICD-10-CM | POA: Diagnosis not present

## 2016-02-27 DIAGNOSIS — Z8673 Personal history of transient ischemic attack (TIA), and cerebral infarction without residual deficits: Secondary | ICD-10-CM | POA: Insufficient documentation

## 2016-02-27 DIAGNOSIS — R300 Dysuria: Secondary | ICD-10-CM | POA: Diagnosis present

## 2016-02-27 DIAGNOSIS — N390 Urinary tract infection, site not specified: Secondary | ICD-10-CM | POA: Insufficient documentation

## 2016-02-27 DIAGNOSIS — Z85820 Personal history of malignant melanoma of skin: Secondary | ICD-10-CM | POA: Insufficient documentation

## 2016-02-27 DIAGNOSIS — R001 Bradycardia, unspecified: Secondary | ICD-10-CM | POA: Insufficient documentation

## 2016-02-27 LAB — URINALYSIS, ROUTINE W REFLEX MICROSCOPIC
Bilirubin Urine: NEGATIVE
GLUCOSE, UA: NEGATIVE mg/dL
Ketones, ur: NEGATIVE mg/dL
Nitrite: NEGATIVE
PROTEIN: 30 mg/dL — AB
SPECIFIC GRAVITY, URINE: 1.014 (ref 1.005–1.030)
pH: 5.5 (ref 5.0–8.0)

## 2016-02-27 LAB — URINE MICROSCOPIC-ADD ON

## 2016-02-27 MED ORDER — CEPHALEXIN 500 MG PO CAPS: 500.0000 mg | ORAL_CAPSULE | Freq: Four times a day (QID) | ORAL | Status: DC

## 2016-02-27 MED ORDER — PHENAZOPYRIDINE HCL 200 MG PO TABS: 200.0000 mg | ORAL_TABLET | Freq: Three times a day (TID) | ORAL | Status: DC

## 2016-02-27 MED ORDER — CEPHALEXIN 250 MG PO CAPS
500.0000 mg | ORAL_CAPSULE | Freq: Once | ORAL | Status: DC
  Filled 2016-02-27: qty 2

## 2016-02-27 NOTE — Discharge Instructions (Signed)
1. Medications: Keflex, pyridium, usual home medications 2. Treatment: rest, drink plenty of fluids, take medications as prescribed 3. Follow Up: Please followup with your primary doctor in 3 days for discussion of your diagnoses and further evaluation after today's visit; if you do not have a primary care doctor use the resource guide provided to find one; return to the ER for fevers, persistent vomiting, worsening abdominal pain or other concerning symptoms.  

## 2016-02-27 NOTE — ED Notes (Signed)
Pt standing at door waiting for discharge instructions; informed pt of prescriptions and further education. Pt expressed understanding of medications and need for follow up. Pt did not receive antibiotic prior to discharge.

## 2016-02-27 NOTE — ED Notes (Signed)
Patient arrives with dysuria and hematuria. States onset of dysuria and pelvic "discomfort" yesterday; today she noticed some blood in her urine. Denies fever, nausea, vomiting, abdominal pain, back pain.

## 2016-02-27 NOTE — ED Provider Notes (Signed)
CSN: [id number]     Arrival date & time 02/27/16  0207 History   First MD Initiated Contact with Patient 02/27/16 0340     Chief Complaint  Patient presents with  . Dysuria     (Consider location/radiation/quality/duration/timing/severity/associated sxs/prior Treatment) The history is provided by the patient and medical records. No language interpreter was used.     [name] is a 55 y.o. female  with a hx of MRSA, CVA, GERD presents to the Emergency Department complaining of gradual, persistent, progressively worsening suprapubic abd pain with associated Dysuria onset yesterday morning. Patient reports she developed hematuria several hours prior to arrival with a worsening of her lower abdominal pain. She reports subjective fevers but no measured fever at home. No nausea or vomiting, no back pain or flank pain. She reports previous history of recurrent urinary tract infection that has been several years. She denies vaginal discharge. No aggravating or alleviating factors.  She denies recent travel, diarrhea, history of kidney stones..     Past Medical History  Diagnosis Date  . MRSA (methicillin resistant Staphylococcus aureus) carrier   . CA - skin cancer   . Crush injury of hand RIGHT IN 2007    X 6 SURG'S  LAST ONE BEING TOTAL WRIST FUSION W/ BONE GRAFT AND PLATE  . Chronic pain due to injury RIGHT HAND/WRIST IN 2007  . Depression   . PTSD (post-traumatic stress disorder) 2007 RIGHT HAND CRUSH INJURY W/ 2 FINGER AMPUTATION  . Stroke ([place]) X2 CVA'S  IN 1990    LOSS OF VISION RIGHT EYE  . Weakness of right leg CAUSED BY RETRIEVAL THIGH MUSCLE FOR FLAP RIGHT HAND INJURY-- 2006    RIGHT LEG GIVES OUT OCCASIONALLY  . Anxiety     BILATERAL HANDS-- LEFT >RIGHT AND LEGS  . GERD (gastroesophageal reflux disease)     TAKES TUMS PRN  . Asthma     NO INHALER  . Arthritis   . Neuromuscular disorder (HCC)     some fibromyalgia  . Blood transfusion without reported diagnosis   .  Substance abuse     hx: narcotic use  . Hemorrhoids   . Bed bug bite   . ETOH abuse    Past Surgical History  Procedure Laterality Date  . Skin cancer removal  2007    throat area  . Right total wrist fusion w/ bone graft and plate  075-[facility]    AND FLAP USING RIGHT GOIN MUSCLE  . Hand carpectomy  12-24-2005    RIGHT-- INCLUDING  REMOVAL  WRIST WIRES AND I &D (EXICIOSNAL)  FOR OSTEOMYELITIS  . Orif radial fracture  09-04-05    DISTAL RADIAL PLATE AND PARTIAL THUMB AMPUTATION  . Incision / drainage hand / finger  X3  (2006 & 2007)    RIGHT CRUSH WOUND  . Orif carpal bone fracture  08-31-05    I & D OF RIGHT HAND CRUSH INJURY/ OPEN FX'S AND INDEX FINGER RAY RESECTION/ COMPLICATED CLOSURE  . Tubal ligation  15 YRS AGO  . Hysteroscopy w/d&c  08/18/2011    Procedure: DILATATION AND CURETTAGE (D&C) /HYSTEROSCOPY;  Surgeon: Anastasio Auerbach, MD;  Location: [place];  Service: Gynecology;  Laterality: N/A;  . Lesion removal  08/18/2011    Procedure: EXCISION VAGINAL LESION;  Surgeon: Anastasio Auerbach, MD;  Location: [place];  Service: Gynecology;  Laterality: N/A;  . Hysteroscopy  12.6.2012    w/removal of polyp  . Colon surgery  15 years ago- fistula on colon removed   Family History  Problem Relation Age of Onset  . Lupus Mother     died at 42  . Breast cancer Maternal Aunt   . Breast cancer Maternal Grandmother   . Stomach cancer Paternal Grandfather   . Colon cancer Neg Hx   . Rectal cancer Neg Hx   . Esophageal cancer Neg Hx    Social History  Substance Use Topics  . Smoking status: Former Smoker -- 1.50 packs/day for 28 years    Types: Cigarettes    Quit date: 11/29/2001  . Smokeless tobacco: Never Used  . Alcohol Use: 0.0 oz/week     Comment: 4 drinks a week   OB History    Gravida Para Term Preterm AB TAB SAB Ectopic Multiple Living   6 6        6      Review of Systems  Constitutional: Positive for fever (subjective).  Negative for diaphoresis, appetite change, fatigue and unexpected weight change.  HENT: Negative for mouth sores.   Eyes: Negative for visual disturbance.  Respiratory: Negative for cough, chest tightness, shortness of breath and wheezing.   Cardiovascular: Negative for chest pain.  Gastrointestinal: Positive for abdominal pain. Negative for nausea, vomiting, diarrhea and constipation.  Endocrine: Negative for polydipsia, polyphagia and polyuria.  Genitourinary: Positive for dysuria. Negative for urgency, frequency and hematuria.  Musculoskeletal: Negative for back pain and neck stiffness.  Skin: Negative for rash.  Allergic/Immunologic: Negative for immunocompromised state.  Neurological: Negative for syncope, light-headedness and headaches.  Hematological: Does not bruise/bleed easily.  Psychiatric/Behavioral: Negative for sleep disturbance. The patient is not nervous/anxious.       Allergies  Aspirin and Codeine  Home Medications   Prior to Admission medications   Medication Sig Start Date End Date Taking? Authorizing Provider  cephALEXin (KEFLEX) 500 MG capsule Take 1 capsule (500 mg total) by mouth 4 (four) times daily. 02/27/16   Jeovanny Cuadros, PA-C  gabapentin (NEURONTIN) 300 MG capsule TAKE 1 CAPSULE (300 MG TOTAL) BY MOUTH 3 (THREE) TIMES DAILY. 11/18/15   Susy Frizzle, MD  mesalamine (CANASA) 1000 MG suppository Place 1 suppository (1,000 mg total) rectally at bedtime. 11/20/15   Susy Frizzle, MD  phenazopyridine (PYRIDIUM) 200 MG tablet Take 1 tablet (200 mg total) by mouth 3 (three) times daily. 02/27/16   Mehul Rudin, PA-C  pregabalin (LYRICA) 100 MG capsule Take 1 capsule (100 mg total) by mouth 3 (three) times daily. 02/02/16   Susy Frizzle, MD  traMADol (ULTRAM) 50 MG tablet Take 1 tablet (50 mg total) by mouth every 6 (six) hours as needed. 02/02/16   Susy Frizzle, MD  Umeclidinium Bromide (INCRUSE ELLIPTA) 62.5 MCG/INH AEPB Inhale 1 Inhaler into  the lungs daily. 10/06/15   Susy Frizzle, MD   BP 143/77 mmHg  Pulse 54  Temp(Src) 97.3 F (36.3 C) (Oral)  Resp 18  SpO2 100% Physical Exam  Constitutional: She appears well-developed and well-nourished. No distress.  Awake, alert, nontoxic appearance  HENT:  Head: Normocephalic and atraumatic.  Mouth/Throat: Oropharynx is clear and moist. No oropharyngeal exudate.  Eyes: Conjunctivae are normal. No scleral icterus.  Neck: Normal range of motion. Neck supple.  Cardiovascular: Regular rhythm, normal heart sounds and intact distal pulses.  Bradycardia present.   No murmur heard. Pulses:      Radial pulses are 2+ on the right side, and 2+ on the left side.  Pulmonary/Chest: Effort normal and breath  sounds normal. No respiratory distress. She has no wheezes.  Equal chest expansion  Abdominal: Soft. Bowel sounds are normal. She exhibits no mass. There is tenderness in the suprapubic area. There is no rebound, no guarding and no CVA tenderness.  Musculoskeletal: Normal range of motion. She exhibits no edema.  Neurological: She is alert.  Speech is clear and goal oriented Moves extremities without ataxia  Skin: Skin is warm and dry. She is not diaphoretic.  Psychiatric: She has a normal mood and affect.  Nursing note and vitals reviewed.   ED Course  Procedures (including critical care time) Labs Review Labs Reviewed  URINALYSIS, ROUTINE W REFLEX MICROSCOPIC (NOT AT [facility]) - Abnormal; Notable for the following:    APPearance CLOUDY (*)    Hgb urine dipstick LARGE (*)    Protein, ur 30 (*)    Leukocytes, UA LARGE (*)    All other components within normal limits  URINE MICROSCOPIC-ADD ON - Abnormal; Notable for the following:    Squamous Epithelial / LPF 6-30 (*)    Bacteria, UA FEW (*)    All other components within normal limits  URINE CULTURE     MDM   Final diagnoses:  UTI (lower urinary tract infection)   [name] presents with dysuria, hematuria and  suprapubic abd pain.  Pt has been diagnosed with a UTI. Pt is afebrile, no CVA tenderness, normotensive, and denies N/V. Tolerating PO without difficulty.  Pt to be dc home with antibiotics and instructions to follow up with PCP if symptoms persist.   Abigail Butts, PA-C 02/27/16 [place], DO 02/27/16 [phone]

## 2016-02-29 ENCOUNTER — Ambulatory Visit (INDEPENDENT_AMBULATORY_CARE_PROVIDER_SITE_OTHER): Payer: Medicare Other | Admitting: Family Medicine

## 2016-02-29 ENCOUNTER — Encounter: Payer: Self-pay | Admitting: Family Medicine

## 2016-02-29 VITALS — BP 136/80 | HR 72 | Temp 98.2°F | Resp 14 | Ht 62.0 in | Wt 140.0 lb

## 2016-02-29 DIAGNOSIS — M79642 Pain in left hand: Secondary | ICD-10-CM | POA: Diagnosis not present

## 2016-02-29 DIAGNOSIS — G629 Polyneuropathy, unspecified: Secondary | ICD-10-CM

## 2016-02-29 DIAGNOSIS — F1020 Alcohol dependence, uncomplicated: Secondary | ICD-10-CM | POA: Diagnosis not present

## 2016-02-29 DIAGNOSIS — N3001 Acute cystitis with hematuria: Secondary | ICD-10-CM | POA: Diagnosis not present

## 2016-02-29 LAB — URINE CULTURE

## 2016-02-29 MED ORDER — SULFAMETHOXAZOLE-TRIMETHOPRIM 800-160 MG PO TABS: 1.0000 | ORAL_TABLET | Freq: Two times a day (BID) | ORAL | Status: DC

## 2016-02-29 NOTE — Progress Notes (Signed)
Subjective:    Patient ID: [name], female    DOB: May 15, 1961, 55 y.o.   MRN: [id number]  HPI  02/02/16 Patient admits to drinking heavily over the last 6-7 years. Recently she was involved in an altercation in a DUI where she was arrested. Afterwards she has stopped drinking over the last 3 weeks. Since that time she reports worsening pain in both hands and in her left leg. She believes she was self-medicating with alcohol. The pain is sharp. It shoots from her fingertips to her elbows bilaterally. It involves all 5 fingers there is no hand weakness. There is some mild numbness and tingling but is mainly sharp lancing pain. It is not limited to any one particular joint. She also complains of some shooting pains from the tips of her toes up to her knee. Pain sounds neuropathic in nature. She is on gabapentin 300 mg 3 times a day with no relief. She is also tried anti-inflammatory drugs over-the-counter with no relief.  At that time, my plan was: I believe the pain is neuropathy. I recommended nerve conduction studies of the arms to evaluate this differential diagnosis and rule out carpal tunnel syndrome. Discontinue gabapentin. Replace with Lyrica 100 mg by mouth 3 times a day for better pain control. I suspect that her insurance will declined this. So I gave her a one-time prescription for tramadol 50 mg 1 tablet every 6 hours as needed but she can take with gabapentin until hopefully we can get the tramadol approved.  02/29/16 Patient is here today for follow-up. Lyrica did not help at all. In fact she stopped the Lyrica due to its ineffectiveness. He does describe her symptoms further she reports severe pain in both hands distal to the wrist. The fifth digit is not involved on the left hand. Patient now states that the symptoms are worse when she drives her car or when she finishes working. 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 She now denies pain in her legs which is  different than the story she told me last time. Her symptoms now sound more like carpal tunnel syndrome.  She was also seen in the emergency room for a urinary tract infection. Urine culture confirmed Serratia. However she was placed on Keflex and unfortunately the pathogen is resistant to Keflex Past Medical History  Diagnosis Date  . MRSA (methicillin resistant Staphylococcus aureus) carrier   . CA - skin cancer   . Crush injury of hand RIGHT IN 2007    X 6 SURG'S  LAST ONE BEING TOTAL WRIST FUSION W/ BONE GRAFT AND PLATE  . Chronic pain due to injury RIGHT HAND/WRIST IN 2007  . Depression   . PTSD (post-traumatic stress disorder) 2007 RIGHT HAND CRUSH INJURY W/ 2 FINGER AMPUTATION  . Stroke ([facility]) X2 CVA'S  IN 1990    LOSS OF VISION RIGHT EYE  . Weakness of right leg CAUSED BY RETRIEVAL THIGH MUSCLE FOR FLAP RIGHT HAND INJURY-- 2006    RIGHT LEG GIVES OUT OCCASIONALLY  . Anxiety     BILATERAL HANDS-- LEFT >RIGHT AND LEGS  . GERD (gastroesophageal reflux disease)     TAKES TUMS PRN  . Asthma     NO INHALER  . Arthritis   . Neuromuscular disorder (HCC)     some fibromyalgia  . Blood transfusion without reported diagnosis   . Substance abuse     hx: narcotic use  . Hemorrhoids   . Bed bug  bite   . ETOH abuse    Past Surgical History  Procedure Laterality Date  . Skin cancer removal  2007    throat area  . Right total wrist fusion w/ bone graft and plate  075-[facility]    AND FLAP USING RIGHT GOIN MUSCLE  . Hand carpectomy  12-24-2005    RIGHT-- INCLUDING  REMOVAL  WRIST WIRES AND I &D (EXICIOSNAL)  FOR OSTEOMYELITIS  . Orif radial fracture  09-04-05    DISTAL RADIAL PLATE AND PARTIAL THUMB AMPUTATION  . Incision / drainage hand / finger  X3  (2006 & 2007)    RIGHT CRUSH WOUND  . Orif carpal bone fracture  08-31-05    I & D OF RIGHT HAND CRUSH INJURY/ OPEN FX'S AND INDEX FINGER RAY RESECTION/ COMPLICATED CLOSURE  . Tubal ligation  15 YRS AGO  . Hysteroscopy w/d&c  08/18/2011     Procedure: DILATATION AND CURETTAGE (D&C) /HYSTEROSCOPY;  Surgeon: Anastasio Auerbach, MD;  Location: [facility];  Service: Gynecology;  Laterality: N/A;  . Lesion removal  08/18/2011    Procedure: EXCISION VAGINAL LESION;  Surgeon: Anastasio Auerbach, MD;  Location: [facility];  Service: Gynecology;  Laterality: N/A;  . Hysteroscopy  12.6.2012    w/removal of polyp  . Colon surgery      15 years ago- fistula on colon removed   Current Outpatient Prescriptions on File Prior to Visit  Medication Sig Dispense Refill  . cephALEXin (KEFLEX) 500 MG capsule Take 1 capsule (500 mg total) by mouth 4 (four) times daily. 40 capsule 0  . mesalamine (CANASA) 1000 MG suppository Place 1 suppository (1,000 mg total) rectally at bedtime. 30 suppository 12  . phenazopyridine (PYRIDIUM) 200 MG tablet Take 1 tablet (200 mg total) by mouth 3 (three) times daily. 6 tablet 0  . Umeclidinium Bromide (INCRUSE ELLIPTA) 62.5 MCG/INH AEPB Inhale 1 Inhaler into the lungs daily. 30 each 6  . gabapentin (NEURONTIN) 300 MG capsule TAKE 1 CAPSULE (300 MG TOTAL) BY MOUTH 3 (THREE) TIMES DAILY. (Patient not taking: Reported on 02/29/2016) 270 capsule 1   No current facility-administered medications on file prior to visit.   Allergies  Allergen Reactions  . Aspirin Shortness Of Breath    ASTHMATIC ATTACK  . Codeine Other (See Comments)    ABNORMAL BEHAVIOR  Increased in bad moods    Social History   Social History  . Marital Status: Single    Spouse Name: N/A  . Number of Children: 6  . Years of Education: N/A   Occupational History  . Housekeeper     Social History Main Topics  . Smoking status: Former Smoker -- 1.50 packs/day for 28 years    Types: Cigarettes    Quit date: 11/29/2001  . Smokeless tobacco: Never Used  . Alcohol Use: 0.0 oz/week     Comment: 4 drinks a week  . Drug Use: Yes    Special: Marijuana     Comment: 10-09-12- pt has quit  . Sexual Activity:     Partners: Male    Birth Control/ Protection: Post-menopausal   Other Topics Concern  . Not on file   Social History Narrative     Review of Systems  All other systems reviewed and are negative.      Objective:   Physical Exam  Cardiovascular: Normal rate.   Pulmonary/Chest: Effort normal and breath sounds normal.   right hand is grossly deformed after a severe accident. Missing 2  digits. The third fourth and fifth digits are present. Wrist is fused. Left hand is completely normal. Negative Tinel. Negative Phalen sign. Normal sensation. There is no erythema or swelling in any joint. There is no palpable crepitus or nodules. She has normal reflexes at the biceps as well as brachioradialis bilaterally. She has normal reflexes at the patella bilaterally.        Assessment & Plan:  Acute cystitis with hematuria - Plan: sulfamethoxazole-trimethoprim (BACTRIM DS,SEPTRA DS) 800-160 MG tablet  Polyneuropathy (HCC)  Left hand pain I am not sure of the cause of the pain in her legs that seems to come and go and be mild. However I believe the pain in her hands is likely carpal tunnel now based on the way she describes it. Using sterile technique, I injected the left carpal tunnel with 1/2 mL of lidocaine and once half cc of 40 mg per mL Kenalog. I injected the patient radial to the palmaris longus tendon at the distal flexor crease. Patient noticed improvement almost immediately in her symptoms as her hand went numb. If symptoms continue to improve, I would recommend a referral to a hand specialist given the significant deformity in her right hand as I would not feel comfortable trying to inject the carpal tunnel space on that side.  Switch antibiotics to Bactrim double strength tablets 1 by mouth twice a day for 7 days for urinary tract infection is resistant to Keflex

## 2016-03-01 ENCOUNTER — Other Ambulatory Visit: Payer: Self-pay | Admitting: *Deleted

## 2016-03-01 DIAGNOSIS — G629 Polyneuropathy, unspecified: Secondary | ICD-10-CM

## 2016-03-02 ENCOUNTER — Telehealth: Payer: Self-pay | Admitting: Family Medicine

## 2016-03-02 MED ORDER — PHENAZOPYRIDINE HCL 200 MG PO TABS: 200.0000 mg | ORAL_TABLET | Freq: Three times a day (TID) | ORAL | Status: DC

## 2016-03-02 NOTE — Telephone Encounter (Signed)
Pt seen Monday for UTI follow up from ED.  Started on new antibiotic.  Still with painful urination.  Told to continue antibiotic and sent refill of Pyr[facility] to [facility] for her.

## 2016-03-03 ENCOUNTER — Telehealth: Payer: Self-pay | Admitting: Family Medicine

## 2016-03-03 MED ORDER — CIPROFLOXACIN HCL 500 MG PO TABS: 500.0000 mg | ORAL_TABLET | Freq: Two times a day (BID) | ORAL | Status: DC

## 2016-03-03 NOTE — Telephone Encounter (Signed)
[facility]  Patient calling to say that the bladder infection is not better, needs something called in if possible  [phone]

## 2016-03-03 NOTE — Telephone Encounter (Signed)
Bactrim should have treated. Cipro 500 bid x 7 days, if not better need cx.

## 2016-03-03 NOTE — Telephone Encounter (Signed)
Pt aware and med sent to requested pharmacy

## 2016-03-22 ENCOUNTER — Ambulatory Visit (INDEPENDENT_AMBULATORY_CARE_PROVIDER_SITE_OTHER): Payer: Medicare Other | Admitting: Neurology

## 2016-03-22 DIAGNOSIS — G629 Polyneuropathy, unspecified: Secondary | ICD-10-CM | POA: Diagnosis not present

## 2016-03-22 DIAGNOSIS — G5622 Lesion of ulnar nerve, left upper limb: Secondary | ICD-10-CM

## 2016-03-22 NOTE — Procedures (Signed)
[facility]  [address]  [place], [place] [address] Tel: [phone] Fax:  [phone] Test Date:  03/22/2016  Patient: [name] DOB: 1960/09/18 Physician: Narda Amber, DO  Sex: Female Height: 5\' 2"  Ref Phys: Jenna Luo, M.D.  ID#: [id number] Temp: 35.7C Technician: Jerilynn Mages. Dean   Patient Complaints: This is a 55 year old female with history of right traumatic crush injury to the lateral hand and thenar eminence with residual loss of the thumb and index finger (2006) who is referred for evaluation of bilateral hand pain and numbness, worse on the left.  NCV & EMG Findings: Extensive electrodiagnostic testing of the left upper extremity and additional studies of the right was somewhat limited due to right hand deformity. Findings are as follows: 1. Bilateral median and ulnar sensory responses are within normal limits. Left mixed palmar studies are also within normal limits. 2. Right median motor response was unable to be tested due to deformity in this region. Right ulnar motor responses within normal limits. Left median motor responses within normal limits; of note, there is evidence of anomalous innervation to the abductor pollicis brevis as noted by a a motor response when recording at the ulnar wrist, consistent with a Martin-Gruber anastomosis. Left ulnar motor response shows conduction velocity slowing across the elbow, with normal amplitude and latency. 3. Needle electrode examination of the right upper extremity was limited to proximal muscles which did not show any abnormalities. In the right upper extremity, chronic motor axon loss changes were isolated to the flexor digitorum profundus, without accompanied active denervation.  Impression: 1. Left ulnar neuropathy with slowing across the elbow, mild in degree electrically. 2. Incidentally, there is a left Martin-Gruber anastomosis, a normal variant, and clinically insignificant. 3. There is no  evidence of a cervical radiculopathy affecting the upper extremities.   ___________________________ Narda Amber, DO    Nerve Conduction Studies Anti Sensory Summary Table   Site NR Peak (ms) Norm Peak (ms) P-T Amp (V) Norm P-T Amp  Left Median Anti Sensory (2nd Digit)  35.7C  Wrist    3.3 <3.6 23.2 >15  Right Median Anti Sensory (2nd Digit)  Wrist    3.4 <3.6 18.8 >15  Left Ulnar Anti Sensory (5th Digit)  35.7C  Wrist    2.9 <3.1 36.9 >10  Right Ulnar Anti Sensory (5th Digit)  Wrist    2.9 <3.1 25.9 >10   Motor Summary Table   Site NR Onset (ms) Norm Onset (ms) O-P Amp (mV) Norm O-P Amp Site1 Site2 Delta-0 (ms) Dist (cm) Vel (m/s) Norm Vel (m/s)  Left Median Motor (Abd Poll Brev)  35.7C  Wrist    3.8 <4.0 7.4 >6 Elbow Wrist 4.0 23.0 58 >50  Elbow    7.8  6.8  Ulnar wrist -crossover Elbow 4.1 0.0    Ulnar wrist -crossover    3.7  7.7         Right Median Motor (Abd Poll Brev)    Traumatic crush injury with loss of index finger and thumb  Wrist NR  <4.0  >6        Left Ulnar Motor (Abd Dig Minimi)  35.7C  Wrist    2.3 <3.1 7.7 >7 B Elbow Wrist 2.8 18.0 64 >50  B Elbow    5.1  6.6  A Elbow B Elbow 2.4 10.0 42 >50  A Elbow    7.5  6.2         Right Ulnar Motor (Abd Dig Minimi)  Wrist    2.5 <3.1 8.7 >7 B Elbow Wrist 2.9 17.0 59 >50  B Elbow    5.4  8.7  A Elbow B Elbow 1.6 10.0 63 >50  A Elbow    7.0  8.5         Left Ulnar (FDI) Motor (1st DI)  35.7C  Wrist    2.7 <4.5 9.8 >7 B Elbow Wrist 2.8 18.0 64 >50  B Elbow    5.5  7.3  A Elbow B Elbow 2.2 10.0 45 >50  A Elbow    7.7  7.2          Comparison Summary Table   Site NR Peak (ms) Norm Peak (ms) P-T Amp (V) Site1 Site2 Delta-P (ms) Norm Delta (ms)  Left Median/Ulnar Palm Comparison (Wrist - 8cm)  35.7C  Median Palm    2.0 <2.2 112.8 Median Palm Ulnar Palm 0.2   Ulnar Palm    1.8 <2.2 33.8       EMG   Side Muscle Ins Act Fibs Psw Fasc Number Recrt Dur Dur. Amp Amp. Poly Poly. Comment  Left 1stDorInt Nml Nml  Nml Nml Nml Nml Nml Nml Nml Nml Nml Nml N/A  Left Ext Indicis Nml Nml Nml Nml Nml Nml Nml Nml Nml Nml Nml Nml N/A  Left PronatorTeres Nml Nml Nml Nml Nml Nml Nml Nml Nml Nml Nml Nml N/A  Left Biceps Nml Nml Nml Nml Nml Nml Nml Nml Nml Nml Nml Nml N/A  Left Triceps Nml Nml Nml Nml Nml Nml Nml Nml Nml Nml Nml Nml N/A  Left Deltoid Nml Nml Nml Nml Nml Nml Nml Nml Nml Nml Nml Nml N/A  Left ABD Dig Min Nml Nml Nml Nml Nml Nml Nml Nml Nml Nml Nml Nml N/A  Left FlexDigProf 4,5 Nml Nml Nml Nml 1- Mod-R Few 1+ Few 1+ Nml Nml N/A  Right PronatorTeres Nml Nml Nml Nml Nml Nml Nml Nml Nml Nml Nml Nml N/A  Right Biceps Nml Nml Nml Nml Nml Nml Nml Nml Nml Nml Nml Nml N/A  Right Triceps Nml Nml Nml Nml Nml Nml Nml Nml Nml Nml Nml Nml N/A  Right Deltoid Nml Nml Nml Nml Nml Nml Nml Nml Nml Nml Nml Nml N/A      Waveforms:

## 2016-03-31 ENCOUNTER — Telehealth: Payer: Self-pay | Admitting: Family Medicine

## 2016-03-31 DIAGNOSIS — G5622 Lesion of ulnar nerve, left upper limb: Secondary | ICD-10-CM

## 2016-03-31 NOTE — Telephone Encounter (Signed)
Patient is calling to say she would like someone to go over her nerve conduction study with her if possible  [phone]

## 2016-04-01 NOTE — Telephone Encounter (Signed)
I have not seen this report.  If we can get a copy I will be glad to review.

## 2016-04-01 NOTE — Telephone Encounter (Signed)
Have you seen this report yet?

## 2016-04-05 NOTE — Telephone Encounter (Signed)
Report is now in Epic under encounters can you review

## 2016-04-07 NOTE — Telephone Encounter (Signed)
Patient aware of providers recommendations and referral placed

## 2016-04-07 NOTE — Telephone Encounter (Signed)
Nerve conduction test shows ulnar neuropathy at the elbow but not carpal tunnel as expected.  Have the symptoms in her hand improved after the cortisone shot I gave her?  If not, I would recommend hand/ortho consult for ulnar neuropathy.

## 2016-04-08 ENCOUNTER — Other Ambulatory Visit: Payer: Self-pay | Admitting: Family Medicine

## 2016-04-08 NOTE — Telephone Encounter (Signed)
Refill appropriate and filled per protocol. 

## 2016-05-12 ENCOUNTER — Ambulatory Visit (INDEPENDENT_AMBULATORY_CARE_PROVIDER_SITE_OTHER): Payer: Medicare Other | Admitting: Gynecology

## 2016-05-12 ENCOUNTER — Encounter: Payer: Self-pay | Admitting: Gynecology

## 2016-05-12 VITALS — BP 122/78 | Ht 62.0 in | Wt 137.0 lb

## 2016-05-12 DIAGNOSIS — Z01419 Encounter for gynecological examination (general) (routine) without abnormal findings: Secondary | ICD-10-CM

## 2016-05-12 DIAGNOSIS — N952 Postmenopausal atrophic vaginitis: Secondary | ICD-10-CM

## 2016-05-12 DIAGNOSIS — Z124 Encounter for screening for malignant neoplasm of cervix: Secondary | ICD-10-CM

## 2016-05-12 DIAGNOSIS — N950 Postmenopausal bleeding: Secondary | ICD-10-CM | POA: Diagnosis not present

## 2016-05-12 NOTE — Progress Notes (Signed)
    [name] [birth year] [id number]        54 y.o.  G6P6  for breast and pelvic exam. Patient has not been the office for over 3 years. Several issues noted below.  Past medical history,surgical history, problem list, medications, allergies, family history and social history were all reviewed and documented as reviewed in the EPIC chart.  ROS:  Performed with pertinent positives and negatives included in the history, assessment and plan.   Additional significant findings :  None   Exam: [name] assistant Vitals:   05/12/16 1126  BP: 122/78  Weight: 137 lb (62.1 kg)  Height: 5\' 2"  (1.575 m)   Body mass index is 25.06 kg/m.  General appearance:  Normal affect, orientation and appearance. Skin: Grossly normal HEENT: Without gross lesions.  No cervical or supraclavicular adenopathy. Thyroid normal.  Lungs:  Clear without wheezing, rales or rhonchi Cardiac: RR, without RMG Abdominal:  Soft, nontender, without masses, guarding, rebound, organomegaly or hernia Breasts:  Examined lying and sitting without masses, retractions, discharge or axillary adenopathy. Pelvic:  Ext/BUS/Vagina with atrophic changes.  Cervix with atrophic changes  Uterus anteverted, normal size, shape and contour, midline and mobile nontender   Adnexa without masses or tenderness    Anus and perineum normal   Rectovaginal normal sphincter tone without palpated masses or tenderness.    Assessment/Plan:  55 y.o. G77P6 female for breast and pelvic exam  1. Postmenopausal bleeding. Patient notes after intercourse she'll have some bleeding on the sheets.  She does not have any pain with intercourse. No vaginal dryness. Exam shows some mild atrophic changes but no abnormalities. She does have a past history of endometrial polyps. Recommended starting with sonohysterogram now to rule out intracavitary abnormalities and she agrees to do so. 2. Postmenopausal. Is having some hot flushes and sweats. Notes that  they seem better on gabapentin but still occur. Reviewed options to include OTC soy based products up to and including HRT. Risks benefits up various choices discussed. Patient not interested in starting HRT. Will try OTC soy. Will follow up if symptoms worsen and she wants to rediscuss HRT. 3. Mammography coming due this month and I reminded her to schedule this. SBE monthly reviewed. 4. Colonoscopy 2014. Repeat at their recommended interval. 5. Pap smear 2012. Pap smear/HPV today. No history of significant abnormal Pap smears previously. 6. DEXA never. We'll plan further into the menopause. Increased calcium vitamin D reviewed. 7. Health maintenance. No routine lab work done as patient reports this done elsewhere.  Follow up 1 year, sooner as needed.  15 minutes of my time in excess of her breast and pelvic exam was spent in direct face to face counseling and coordination of care in regards to her problems of postmenopausal bleeding and issues of menopausal symptoms with treatment options reviewed.Anastasio Auerbach MD, 11:53 AM 05/12/2016

## 2016-05-12 NOTE — Addendum Note (Signed)
Addended by: Nelva Nay on: 05/12/2016 12:20 PM   Modules accepted: Orders

## 2016-05-12 NOTE — Patient Instructions (Signed)
Follow up for ultrasound as scheduled 

## 2016-05-13 LAB — PAP IG AND HPV HIGH-RISK: HPV DNA High Risk: NOT DETECTED

## 2016-05-19 ENCOUNTER — Telehealth: Payer: Self-pay

## 2016-05-19 NOTE — Telephone Encounter (Signed)
Spoke with patient and let her know that he ins covered the [facility]. 2 codes at 100% and 2 codes applied to her 20% co-insurance. Her estimated procedure prepayment is $44 due a day of visit.

## 2016-05-23 ENCOUNTER — Other Ambulatory Visit: Payer: Self-pay | Admitting: Gynecology

## 2016-05-23 DIAGNOSIS — N950 Postmenopausal bleeding: Secondary | ICD-10-CM

## 2016-06-01 DIAGNOSIS — G5622 Lesion of ulnar nerve, left upper limb: Secondary | ICD-10-CM | POA: Diagnosis not present

## 2016-06-01 DIAGNOSIS — M25522 Pain in left elbow: Secondary | ICD-10-CM | POA: Diagnosis not present

## 2016-06-02 ENCOUNTER — Other Ambulatory Visit: Payer: Self-pay | Admitting: Gynecology

## 2016-06-02 ENCOUNTER — Encounter: Payer: Self-pay | Admitting: Gynecology

## 2016-06-02 ENCOUNTER — Ambulatory Visit (INDEPENDENT_AMBULATORY_CARE_PROVIDER_SITE_OTHER): Payer: Medicare Other | Admitting: Gynecology

## 2016-06-02 ENCOUNTER — Ambulatory Visit (INDEPENDENT_AMBULATORY_CARE_PROVIDER_SITE_OTHER): Payer: Medicare Other

## 2016-06-02 VITALS — BP 120/80

## 2016-06-02 DIAGNOSIS — R9389 Abnormal findings on diagnostic imaging of other specified body structures: Secondary | ICD-10-CM

## 2016-06-02 DIAGNOSIS — R938 Abnormal findings on diagnostic imaging of other specified body structures: Secondary | ICD-10-CM

## 2016-06-02 DIAGNOSIS — N9489 Other specified conditions associated with female genital organs and menstrual cycle: Secondary | ICD-10-CM | POA: Diagnosis not present

## 2016-06-02 DIAGNOSIS — N840 Polyp of corpus uteri: Secondary | ICD-10-CM

## 2016-06-02 DIAGNOSIS — N950 Postmenopausal bleeding: Secondary | ICD-10-CM

## 2016-06-02 NOTE — Patient Instructions (Signed)
Office will call you with the biopsy results.  Office will call you to schedule surgery

## 2016-06-02 NOTE — Progress Notes (Signed)
    [name] 1961/02/01 [id number]        54 y.o.  G6P6 presents for sonohysterogram due to episode of postmenopausal bleeding.  Past medical history,surgical history, problem list, medications, allergies, family history and social history were all reviewed and documented in the EPIC chart.  Directed ROS with pertinent positives and negatives documented in the history of present illness/assessment and plan.  Exam: Pam [place] assistant Vitals:   06/02/16 1456  BP: 120/80   General appearance:  Normal Abdomen soft nontender without masses guarding rebound Pelvic external BUS vagina with atrophic changes. Cervix normal. Uterus anteverted normal size midline mobile nontender. Adnexa without masses or tenderness.  Ultrasound shows uterus with normal echotexture and size. Endometrial echo 9 mm. Echogenic focus 19 x 8 mm noted. Right and left ovaries normal.  Cul-de-sac negative  Sonohysterogram performed, sterile technique, easy catheter introduction, good distention with endometrial defect 20 x 8 x 9 mm posterior wall consistent with endometrial polyp. Endometrial sample taken. Patient tolerated well.   Assessment/Plan:  55 y.o. G6P6 with postmenopausal bleeding and sonohysterogram consistent with endometrial polyp. Reviewed situational my recommendation to proceed with hysteroscopy D&C resection of the endometrial polyp. I reviewed with involved with the procedure to include the intraoperative and postoperative courses. Use of the hysteroscope and resectoscope as well as the D&C portion discussed. Risks to include infection, bleeding up to and including transfusion, damage to surrounding tissues including vagina cervix uterus and perforation leading to damage to internal organs including bowel bladder ureters vessels and nerves necessitating major exploratory reparative surgeries and future reparative surgeries including bowel resection, ostomy formation, bladder and ureteral damage  repair. Distended media absorption leading to metabolic complications or fluid overload also discussed. Patient agrees to proceeding with surgery and will schedule at her convenience.    Anastasio Auerbach MD, 3:10 PM 06/02/2016

## 2016-06-03 ENCOUNTER — Telehealth: Payer: Self-pay

## 2016-06-03 MED ORDER — MISOPROSTOL 200 MCG PO TABS: ORAL_TABLET | ORAL | 0 refills | Status: DC

## 2016-06-03 NOTE — Telephone Encounter (Signed)
I called patient to schedule her surgery. Surgery scheduled for 06/14/16 at 7:30am at [facility].  She will be contacted by our front desk and scheduled for pre op appt with Dr. Loetta Rough.  I instructed her regarding need for Cytotec tab vaginally hs before surgery and that Rx was sent to her pharmacy.  We discussed her ins and that it pays the professional fees at 100% so no prepymt due from her to [facility].

## 2016-06-06 NOTE — Patient Instructions (Signed)
Your procedure is scheduled on:  Tuesday, Oct. 3, 2017  Enter through the [employer] [facility] at:  6:00 AM  Pick up the phone at the desk and dial [phone].  Call this number if you have problems the morning of surgery: [phone].  Remember: Do NOT eat food or drink after:  Midnight Monday  Take these medicines the morning of surgery with a SIP OF WATER:  Gabapentin  Do NOT wear jewelry (body piercing), metal hair clips/bobby pins, make-up, or nail polish. Do NOT wear lotions, powders, or perfumes.  You may wear deodorant. Do NOT shave for 48 hours prior to surgery. Do NOT bring valuables to the hospital. Contacts, dentures, or bridgework may not be worn into surgery.  Have a responsible adult drive you home and stay with you for 24 hours after your procedure

## 2016-06-07 ENCOUNTER — Encounter (HOSPITAL_COMMUNITY): Payer: Self-pay

## 2016-06-07 ENCOUNTER — Encounter (HOSPITAL_COMMUNITY)
Admission: RE | Admit: 2016-06-07 | Discharge: 2016-06-07 | Disposition: A | Discharge status: Home / Self Care | Payer: Medicare Other | Source: Ambulatory Visit | Attending: Gynecology | Admitting: Gynecology

## 2016-06-07 DIAGNOSIS — Z01812 Encounter for preprocedural laboratory examination: Secondary | ICD-10-CM | POA: Insufficient documentation

## 2016-06-07 DIAGNOSIS — N950 Postmenopausal bleeding: Secondary | ICD-10-CM | POA: Diagnosis not present

## 2016-06-07 DIAGNOSIS — N840 Polyp of corpus uteri: Secondary | ICD-10-CM | POA: Diagnosis not present

## 2016-06-07 HISTORY — DX: Lesion of ulnar nerve, unspecified upper limb: G56.20

## 2016-06-07 HISTORY — DX: Adverse effect of unspecified anesthetic, initial encounter: T41.45XA

## 2016-06-07 HISTORY — DX: Chronic obstructive pulmonary disease, unspecified: J44.9

## 2016-06-07 HISTORY — DX: Other complications of anesthesia, initial encounter: T88.59XA

## 2016-06-07 LAB — COMPREHENSIVE METABOLIC PANEL
ALK PHOS: 60 U/L (ref 38–126)
ALT: 27 U/L (ref 14–54)
ANION GAP: 7 (ref 5–15)
AST: 29 U/L (ref 15–41)
Albumin: 4.4 g/dL (ref 3.5–5.0)
BUN: 14 mg/dL (ref 6–20)
CALCIUM: 10.4 mg/dL — AB (ref 8.9–10.3)
CHLORIDE: 107 mmol/L (ref 101–111)
CO2: 26 mmol/L (ref 22–32)
CREATININE: 0.67 mg/dL (ref 0.44–1.00)
Glucose, Bld: 95 mg/dL (ref 65–99)
Potassium: 5.1 mmol/L (ref 3.5–5.1)
Sodium: 140 mmol/L (ref 135–145)
Total Bilirubin: 0.7 mg/dL (ref 0.3–1.2)
Total Protein: 7.2 g/dL (ref 6.5–8.1)

## 2016-06-07 LAB — CBC
HCT: 41.9 % (ref 36.0–46.0)
Hemoglobin: 14.2 g/dL (ref 12.0–15.0)
MCH: 31.6 pg (ref 26.0–34.0)
MCHC: 33.9 g/dL (ref 30.0–36.0)
MCV: 93.1 fL (ref 78.0–100.0)
PLATELETS: 304 10*3/uL (ref 150–400)
RBC: 4.5 MIL/uL (ref 3.87–5.11)
RDW: 13 % (ref 11.5–15.5)
WBC: 10.1 10*3/uL (ref 4.0–10.5)

## 2016-06-07 LAB — SURGICAL PCR SCREEN
MRSA, PCR: NEGATIVE
STAPHYLOCOCCUS AUREUS: NEGATIVE

## 2016-06-08 ENCOUNTER — Ambulatory Visit: Payer: Medicare Other | Admitting: Gynecology

## 2016-06-09 ENCOUNTER — Ambulatory Visit (INDEPENDENT_AMBULATORY_CARE_PROVIDER_SITE_OTHER): Payer: Medicare Other | Admitting: Gynecology

## 2016-06-09 ENCOUNTER — Encounter: Payer: Self-pay | Admitting: Gynecology

## 2016-06-09 VITALS — BP 116/76

## 2016-06-09 DIAGNOSIS — N840 Polyp of corpus uteri: Secondary | ICD-10-CM

## 2016-06-09 DIAGNOSIS — N950 Postmenopausal bleeding: Secondary | ICD-10-CM | POA: Diagnosis not present

## 2016-06-09 NOTE — Progress Notes (Signed)
[name] 1961/08/28 [id number]   Preoperative consult  Chief complaint: Postmenopausal bleeding, endometrial polyps  History of present illness: 55 y.o. G6P6 with history of recent onset of bleeding after intercourse. No pain. Exam showed atrophic changes but no pathology. Sonohysterogram showed a normal-appearing uterus with endometrial echo 9 mm. Echogenic focus was noted within measuring 19 x 8 mm. Right and left ovaries were normal. Sonohysterogram distention of the cavity showed a 20 x 8 x 9 mm posterior wall defect consistent with endometrial polyp. Endometrial biopsy showed atrophic endometrium. Patient is admitted for hysteroscopy D&C with resection of her endometrial polyp.   Past medical history,surgical history, medications, allergies, family history and social history were all reviewed and documented in the EPIC chart.  ROS:  Was performed and pertinent positives and negatives are included in the history of present illness.  Exam:  [name] assistant Vitals:   06/09/16 1207  BP: 116/76   General: well developed, well nourished female, no acute distress HEENT: normal  Lungs: clear to auscultation without wheezing, rales or rhonchi  Cardiac: regular rate without rubs, murmurs or gallops  Abdomen: soft, nontender without masses, guarding, rebound, organomegaly  06/02/2016  Pelvic: external bus vagina: normal  with atrophic changes Cervix: grossly normal with atrophic changes Uterus: normal size, midline and mobile, nontender  Adnexa: without masses or tenderness     Assessment/Plan:  55 y.o. G6P6 with postmenopausal bleeding and sonohysterogram consistent with endometrial polyp for hysteroscopy D&C resection of the endometrial polyp. I reviewed the proposed surgery with the patient to include the expected intraoperative and postoperative courses as well as the recovery period. The use of the hysteroscope, resectoscope and the D&C portion were all discussed. The risks  of surgery to include infection, prolonged antibiotics, hemorrhage necessitating transfusion and the risks of transfusion, including transfusion reaction, hepatitis, HIV, mad cow disease and other unknown entities were all discussed understood and accepted. The risk of damage to internal organs during the procedure, either immediately recognized or delay recognized, including vagina, cervix, uterus, possible perforation causing damage to bowel, bladder, ureters, vessels and nerves necessitating major exploratory reparative surgery and future reparative surgeries including bladder repair, ureteral damage repair, bowel resection, ostomy formation was also discussed understood and accepted. The potential for distended media absorption leading to metabolic complications such as fluid overload, coma and seizures was also discussed understood and accepted. She understands there are no guarantees that this will relieve her bleeding. The patient's questions were answered to her satisfaction and she is ready to proceed with surgery.      Ann Auerbach MD, 12:32 PM 06/09/2016

## 2016-06-09 NOTE — Patient Instructions (Signed)
Followup for surgery as scheduled. 

## 2016-06-09 NOTE — H&P (Signed)
[name] 06-03-1961 [id number]   History and Physical  Chief complaint: Postmenopausal bleeding, endometrial polyps  History of present illness: 55 y.o. G6P6 with history of recent onset of bleeding after intercourse. No pain. Exam showed atrophic changes but no pathology. Sonohysterogram showed a normal-appearing uterus with endometrial echo 9 mm. Echogenic focus was noted within measuring 19 x 8 mm. Right and left ovaries were normal. Sonohysterogram distention of the cavity showed a 20 x 8 x 9 mm posterior wall defect consistent with endometrial polyp. Endometrial biopsy showed atrophic endometrium. Patient is admitted for hysteroscopy D&C with resection of her endometrial polyp.   Past medical history,surgical history, medications, allergies, family history and social history were all reviewed and documented in the EPIC chart.  ROS:  Was performed and pertinent positives and negatives are included in the history of present illness.  Exam:  [name] assistant Vitals:   06/09/16 1207  BP: 116/76   General: well developed, well nourished female, no acute distress HEENT: normal  Lungs: clear to auscultation without wheezing, rales or rhonchi  Cardiac: regular rate without rubs, murmurs or gallops  Abdomen: soft, nontender without masses, guarding, rebound, organomegaly  06/02/2016  Pelvic: external bus vagina: normal  with atrophic changes Cervix: grossly normal with atrophic changes Uterus: normal size, midline and mobile, nontender  Adnexa: without masses or tenderness     Assessment/Plan:  55 y.o. G6P6 with postmenopausal bleeding and sonohysterogram consistent with endometrial polyp for hysteroscopy D&C resection of the endometrial polyp. I reviewed the proposed surgery with the patient to include the expected intraoperative and postoperative courses as well as the recovery period. The use of the hysteroscope, resectoscope and the D&C portion were all discussed. The risks  of surgery to include infection, prolonged antibiotics, hemorrhage necessitating transfusion and the risks of transfusion, including transfusion reaction, hepatitis, HIV, mad cow disease and other unknown entities were all discussed understood and accepted. The risk of damage to internal organs during the procedure, either immediately recognized or delay recognized, including vagina, cervix, uterus, possible perforation causing damage to bowel, bladder, ureters, vessels and nerves necessitating major exploratory reparative surgery and future reparative surgeries including bladder repair, ureteral damage repair, bowel resection, ostomy formation was also discussed understood and accepted. The potential for distended media absorption leading to metabolic complications such as fluid overload, coma and seizures was also discussed understood and accepted. She understands there are no guarantees that this will relieve her bleeding. The patient's questions were answered to her satisfaction and she is ready to proceed with surgery.    Anastasio Auerbach MD, 12:37 PM 06/09/2016

## 2016-06-13 MED ORDER — DEXTROSE 5 % IV SOLN
2.0000 g | INTRAVENOUS | Status: AC
  Administered 2016-06-14: 2 g via INTRAVENOUS
  Filled 2016-06-13: qty 2

## 2016-06-14 ENCOUNTER — Ambulatory Visit (HOSPITAL_COMMUNITY): Payer: Medicare Other | Admitting: Anesthesiology

## 2016-06-14 ENCOUNTER — Encounter (HOSPITAL_COMMUNITY): Payer: Self-pay

## 2016-06-14 ENCOUNTER — Encounter (HOSPITAL_COMMUNITY)
Admission: RE | Disposition: A | Discharge status: Home / Self Care | Payer: Self-pay | Source: Ambulatory Visit | Attending: Gynecology

## 2016-06-14 ENCOUNTER — Ambulatory Visit (HOSPITAL_COMMUNITY)
Admission: RE | Admit: 2016-06-14 | Discharge: 2016-06-14 | Disposition: A | Discharge status: Home / Self Care | Payer: Medicare Other | Source: Ambulatory Visit | Attending: Gynecology | Admitting: Gynecology

## 2016-06-14 DIAGNOSIS — Z87891 Personal history of nicotine dependence: Secondary | ICD-10-CM | POA: Insufficient documentation

## 2016-06-14 DIAGNOSIS — N950 Postmenopausal bleeding: Secondary | ICD-10-CM | POA: Insufficient documentation

## 2016-06-14 DIAGNOSIS — N840 Polyp of corpus uteri: Secondary | ICD-10-CM | POA: Diagnosis not present

## 2016-06-14 DIAGNOSIS — K219 Gastro-esophageal reflux disease without esophagitis: Secondary | ICD-10-CM | POA: Diagnosis not present

## 2016-06-14 HISTORY — PX: DILATATION & CURETTAGE/HYSTEROSCOPY WITH MYOSURE: SHX6511

## 2016-06-14 SURGERY — DILATATION & CURETTAGE/HYSTEROSCOPY WITH MYOSURE
Anesthesia: General | Site: Vagina

## 2016-06-14 MED ORDER — PROPOFOL 10 MG/ML IV BOLUS
INTRAVENOUS | Status: DC | PRN
  Administered 2016-06-14: 200 mg via INTRAVENOUS

## 2016-06-14 MED ORDER — ONDANSETRON HCL 4 MG/2ML IJ SOLN
INTRAMUSCULAR | Status: DC | PRN
  Administered 2016-06-14: 4 mg via INTRAVENOUS

## 2016-06-14 MED ORDER — LIDOCAINE HCL (CARDIAC) 20 MG/ML IV SOLN
INTRAVENOUS | Status: AC
  Filled 2016-06-14: qty 5

## 2016-06-14 MED ORDER — HYDROMORPHONE HCL 1 MG/ML IJ SOLN
INTRAMUSCULAR | Status: AC
  Filled 2016-06-14: qty 1

## 2016-06-14 MED ORDER — LIDOCAINE HCL (CARDIAC) 20 MG/ML IV SOLN
INTRAVENOUS | Status: DC | PRN
  Administered 2016-06-14: 70 mg via INTRAVENOUS

## 2016-06-14 MED ORDER — HYDROMORPHONE HCL 1 MG/ML IJ SOLN
0.2500 mg | INTRAMUSCULAR | Status: DC | PRN
  Administered 2016-06-14: 0.25 mg via INTRAVENOUS

## 2016-06-14 MED ORDER — LACTATED RINGERS IV SOLN
INTRAVENOUS | Status: DC
  Administered 2016-06-14: 125 mL/h via INTRAVENOUS
  Administered 2016-06-14: 08:00:00 via INTRAVENOUS

## 2016-06-14 MED ORDER — SCOPOLAMINE 1 MG/3DAYS TD PT72
MEDICATED_PATCH | TRANSDERMAL | Status: AC
  Administered 2016-06-14: 1.5 mg via TRANSDERMAL
  Filled 2016-06-14: qty 1

## 2016-06-14 MED ORDER — MIDAZOLAM HCL 2 MG/2ML IJ SOLN
INTRAMUSCULAR | Status: DC | PRN
  Administered 2016-06-14: 2 mg via INTRAVENOUS

## 2016-06-14 MED ORDER — LIDOCAINE HCL 1 % IJ SOLN
INTRAMUSCULAR | Status: AC
  Filled 2016-06-14: qty 20

## 2016-06-14 MED ORDER — SCOPOLAMINE 1 MG/3DAYS TD PT72
1.0000 | MEDICATED_PATCH | Freq: Once | TRANSDERMAL | Status: DC
  Administered 2016-06-14: 1.5 mg via TRANSDERMAL

## 2016-06-14 MED ORDER — FENTANYL CITRATE (PF) 100 MCG/2ML IJ SOLN
INTRAMUSCULAR | Status: DC | PRN
  Administered 2016-06-14 (×2): 50 ug via INTRAVENOUS

## 2016-06-14 MED ORDER — LIDOCAINE HCL 1 % IJ SOLN
INTRAMUSCULAR | Status: DC | PRN
  Administered 2016-06-14: 10 mL

## 2016-06-14 MED ORDER — SODIUM CHLORIDE 0.9 % IR SOLN
Status: DC | PRN
  Administered 2016-06-14: 3000 mL

## 2016-06-14 MED ORDER — ONDANSETRON HCL 4 MG/2ML IJ SOLN: 4.0000 mg | Freq: Once | INTRAMUSCULAR | Status: DC | PRN

## 2016-06-14 MED ORDER — DEXAMETHASONE SODIUM PHOSPHATE 4 MG/ML IJ SOLN
INTRAMUSCULAR | Status: AC
  Filled 2016-06-14: qty 1

## 2016-06-14 MED ORDER — ONDANSETRON HCL 4 MG/2ML IJ SOLN
INTRAMUSCULAR | Status: AC
  Filled 2016-06-14: qty 2

## 2016-06-14 MED ORDER — FENTANYL CITRATE (PF) 100 MCG/2ML IJ SOLN
INTRAMUSCULAR | Status: AC
  Filled 2016-06-14: qty 4

## 2016-06-14 MED ORDER — OXYCODONE HCL 5 MG PO TABS: 5.0000 mg | ORAL_TABLET | Freq: Once | ORAL | Status: DC | PRN

## 2016-06-14 MED ORDER — HYDROCODONE-ACETAMINOPHEN 2.5-325 MG PO TABS: 1.0000 | ORAL_TABLET | Freq: Four times a day (QID) | ORAL | 0 refills | Status: DC | PRN

## 2016-06-14 MED ORDER — DEXAMETHASONE SODIUM PHOSPHATE 4 MG/ML IJ SOLN
INTRAMUSCULAR | Status: DC | PRN
  Administered 2016-06-14: 4 mg via INTRAVENOUS

## 2016-06-14 MED ORDER — PROPOFOL 10 MG/ML IV BOLUS
INTRAVENOUS | Status: AC
  Filled 2016-06-14: qty 20

## 2016-06-14 MED ORDER — OXYCODONE HCL 5 MG/5ML PO SOLN: 5.0000 mg | Freq: Once | ORAL | Status: DC | PRN

## 2016-06-14 MED ORDER — MEPERIDINE HCL 25 MG/ML IJ SOLN: 6.2500 mg | INTRAMUSCULAR | Status: DC | PRN

## 2016-06-14 MED ORDER — MIDAZOLAM HCL 2 MG/2ML IJ SOLN
INTRAMUSCULAR | Status: AC
Start: 2016-06-14 — End: 2016-06-14
  Filled 2016-06-14: qty 2

## 2016-06-14 SURGICAL SUPPLY — 17 items
CATH ROBINSON RED A/P 16FR (CATHETERS) ×3 IMPLANT
CLOTH BEACON ORANGE TIMEOUT ST (SAFETY) ×3 IMPLANT
CONTAINER PREFILL 10% NBF 60ML (FORM) ×6 IMPLANT
DEVICE MYOSURE LITE (MISCELLANEOUS) ×3 IMPLANT
DEVICE MYOSURE REACH (MISCELLANEOUS) IMPLANT
FILTER ARTHROSCOPY CONVERTOR (FILTER) ×3 IMPLANT
GLOVE BIO SURGEON STRL SZ7.5 (GLOVE) ×6 IMPLANT
GLOVE BIOGEL PI IND STRL 7.0 (GLOVE) ×1 IMPLANT
GLOVE BIOGEL PI INDICATOR 7.0 (GLOVE) ×2
GOWN STRL REUS W/TWL LRG LVL3 (GOWN DISPOSABLE) ×6 IMPLANT
PACK VAGINAL MINOR WOMEN LF (CUSTOM PROCEDURE TRAY) ×3 IMPLANT
PAD OB MATERNITY 4.3X12.25 (PERSONAL CARE ITEMS) ×3 IMPLANT
SEAL ROD LENS SCOPE MYOSURE (ABLATOR) ×3 IMPLANT
TOWEL OR 17X24 6PK STRL BLUE (TOWEL DISPOSABLE) ×6 IMPLANT
TUBING AQUILEX INFLOW (TUBING) ×3 IMPLANT
TUBING AQUILEX OUTFLOW (TUBING) ×3 IMPLANT
WATER STERILE IRR 1000ML POUR (IV SOLUTION) ×3 IMPLANT

## 2016-06-14 NOTE — Addendum Note (Signed)
Addendum  created 06/14/16 1109 by Brock Ra, CRNA   Charge Capture section accepted

## 2016-06-14 NOTE — Addendum Note (Signed)
Addendum  created 06/14/16 1044 by Brock Ra, CRNA   Anesthesia Intra Flowsheets edited

## 2016-06-14 NOTE — Anesthesia Preprocedure Evaluation (Signed)
Anesthesia Evaluation  Patient identified by MRN, date of birth, ID band Patient awake    Reviewed: Allergy & Precautions, NPO status , Patient's Chart, lab work & pertinent test results  Airway Mallampati: I  TM Distance: >3 FB Neck ROM: Full    Dental  (+) Upper Dentures, Lower Dentures, Dental Advisory Given   Pulmonary COPD, former smoker,    breath sounds clear to auscultation       Cardiovascular  Rhythm:Regular Rate:Normal     Neuro/Psych    GI/Hepatic GERD  Medicated and Controlled,  Endo/Other    Renal/GU      Musculoskeletal   Abdominal   Peds  Hematology   Anesthesia Other Findings   Reproductive/Obstetrics                             Anesthesia Physical Anesthesia Plan  ASA: II  Anesthesia Plan: General   Post-op Pain Management:    Induction: Intravenous  Airway Management Planned: LMA  Additional Equipment:   Intra-op Plan:   Post-operative Plan: Extubation in OR  Informed Consent: I have reviewed the patients History and Physical, chart, labs and discussed the procedure including the risks, benefits and alternatives for the proposed anesthesia with the patient or authorized representative who has indicated his/her understanding and acceptance.   Dental advisory given  Plan Discussed with: CRNA, Anesthesiologist and Surgeon  Anesthesia Plan Comments:         Anesthesia Quick Evaluation

## 2016-06-14 NOTE — Addendum Note (Signed)
Addendum  created 06/14/16 1043 by Brock Ra, CRNA   Anesthesia Intra Flowsheets edited

## 2016-06-14 NOTE — Discharge Instructions (Signed)
°  Post Anesthesia Home Care Instructions ° °Activity: °Get plenty of rest for the remainder of the day. A responsible adult should stay with you for 24 hours following the procedure.  °For the next 24 hours, DO NOT: °-Drive a car °-Operate machinery °-Drink alcoholic beverages °-Take any medication unless instructed by your physician °-Make any legal decisions or sign important papers. ° °Meals: °Start with liquid foods such as gelatin or soup. Progress to regular foods as tolerated. Avoid greasy, spicy, heavy foods. If nausea and/or vomiting occur, drink only clear liquids until the nausea and/or vomiting subsides. Call your physician if vomiting continues. ° °Special Instructions/Symptoms: °Your throat may feel dry or sore from the anesthesia or the breathing tube placed in your throat during surgery. If this causes discomfort, gargle with warm salt water. The discomfort should disappear within 24 hours. ° °If you had a scopolamine patch placed behind your ear for the management of post- operative nausea and/or vomiting: ° °1. The medication in the patch is effective for 72 hours, after which it should be removed.  Wrap patch in a tissue and discard in the trash. Wash hands thoroughly with soap and water. °2. You may remove the patch earlier than 72 hours if you experience unpleasant side effects which may include dry mouth, dizziness or visual disturbances. °3. Avoid touching the patch. Wash your hands with soap and water after contact with the patch. °  ° ° °Postoperative Instructions Hysteroscopy D & C ° °Dr. Fontaine and the nursing staff have discussed postoperative instructions with you.  If you have any questions please ask them before you leave the hospital, or call Dr Fontaine’s office at [phone].   ° °We would like to emphasize the following instructions: ° ° °? Call the office to make your follow-up appointment as recommended by Dr Fontaine (usually 1-2 weeks). ° °? You were given a prescription,  or one was ordered for you at the pharmacy you designated.  Get that prescription filled and take the medication according to instructions. ° °? You may eat a regular diet, but slowly until you start having bowel movements. ° °? Drink plenty of water daily. ° °? Nothing in the vagina (intercourse, douching, objects of any kind) for two weeks.  When reinitiating intercourse, if it is uncomfortable, stop and make an appointment with Dr Fontaine to be evaluated. ° °? No driving for one to two days until the effects of anesthesia has worn off.  No traveling out of town for several days. ° °? You may shower, but no baths for one week.  Walking up and down stairs is ok.  No heavy lifting, prolonged standing, repeated bending or any “working out” until your post op check. ° °? Rest frequently, listen to your body and do not push yourself and overdo it. ° °? Call if: ° °o Your pain medication does not seem strong enough. °o Worsening pain or abdominal bloating °o Persistent nausea or vomiting °o Difficulty with urination or bowel movements. °o Temperature of 101 degrees or higher. °o Heavy vaginal bleeding.  If your period is due, you may use tampons. °o You have any questions or concerns ° ° ° ° °

## 2016-06-14 NOTE — Anesthesia Procedure Notes (Signed)
Procedure Name: LMA Insertion Date/Time: 06/14/2016 7:19 AM Performed by: Brock Ra Pre-anesthesia Checklist: Patient identified, Emergency Drugs available, Suction available, Patient being monitored and Timeout performed Patient Re-evaluated:Patient Re-evaluated prior to inductionOxygen Delivery Method: Circle system utilized Preoxygenation: Pre-oxygenation with 100% oxygen Intubation Type: IV induction Ventilation: Mask ventilation without difficulty LMA: LMA inserted LMA Size: 4.0 Number of attempts: 1 Placement Confirmation: positive ETCO2 and breath sounds checked- equal and bilateral Tube secured with: Tape Dental Injury: Teeth and Oropharynx as per pre-operative assessment

## 2016-06-14 NOTE — Transfer of Care (Signed)
Immediate Anesthesia Transfer of Care Note  Patient: [name]  Procedure(s) Performed: Procedure(s): DILATATION & CURETTAGE/HYSTEROSCOPY WITH MYOSURE (N/A)  Patient Location: PACU  Anesthesia Type:General  Level of Consciousness: awake, alert , oriented and patient cooperative  Airway & Oxygen Therapy: Patient Spontanous Breathing and Patient connected to nasal cannula oxygen  Post-op Assessment: Report given to RN and Post -op Vital signs reviewed and stable  Post vital signs: Reviewed and stable  Last Vitals:  TEMP 97.6 BP 125/58 HR 52 RR 10 POX 100  Last Pain: 0       Complications: No apparent anesthesia complications

## 2016-06-14 NOTE — Op Note (Signed)
[name] [birth year] [id number]   Post Operative Note   Date of surgery:  06/14/2016  Pre Op Dx:  Postmenopausal bleeding, endometrial polyp  Post Op Dx:  Post menopausal bleeding, endometrial polyp  Procedure:  Hysteroscopy, D&C, Myosure resection of endometrial polyp  Surgeon:  Donalynn Furlong P  Anesthesia:  General  EBL:  Minimal  Distended media discrepancy:  [id number] cc saline  Complications:  None  Specimen:  #1 endometrial polyp #2 endometrial curetting to pathology  Findings: EUA:  External BUS vagina with atrophic changes. Cervix with atrophic changes. Uterus anteverted normal size midline mobile. Adnexa without masses   Hysteroscopy: Fundus, anterior/posterior endometrial surfaces, lower uterine segment, endocervical canal all visualized. Right tubal ostia visualized. Left tubal ostia not clearly visualized. Region of the os involved base of the polyp. Fingerlike endometrial polyp left mid to lower lateral endometrial surface. Resected in its entirety to the level the surrounding endometrium.  Procedure:  The patient was taken to the operating room, was placed in the low dorsal lithotomy position, underwent general anesthesia, received a perineal/vaginal preparation with Betadine solution per nursing personnel and the bladder was emptied with in and out Foley catheterization. The timeout was performed by the surgical team. An EUA was performed. The patient was draped in the usual fashion. The cervix was visualized with a speculum, anterior lip grasped with a single-tooth tenaculum and a paracervical block was placed using 10 cc's of 1% lidocaine. The cervix was gently dilated to admit the Myosure hysteroscope and hysteroscopy was performed with findings noted above. Using the Myosure Lite resectoscopic wand the polyp was resected in its entirety to the level the surrounding endometrium. A gentle sharp curettage was performed. Both specimens were sent separately to pathology.   Repeat hysteroscopy showed an empty cavity with good distention and no evidence of perforation. The instruments were removed and adequate hemostasis was visualized at the tenaculum site and external cervical os. The patient was awakened without difficulty and was taken to the recovery room in good condition having tolerated the procedure well.     Anastasio Auerbach MD, 8:01 AM 06/14/2016

## 2016-06-14 NOTE — H&P (Signed)
  The patient was examined.  I reviewed the proposed surgery and consent form with the patient.  The dictated history and physical is current and accurate and all questions were answered. The patient is ready to proceed with surgery and has a realistic understanding and expectation for the outcome.   Anastasio Auerbach MD, 7:04 AM 06/14/2016

## 2016-06-14 NOTE — Anesthesia Postprocedure Evaluation (Signed)
Anesthesia Post Note  Patient: [name]  Procedure(s) Performed: Procedure(s) (LRB): DILATATION & CURETTAGE/HYSTEROSCOPY WITH MYOSURE (N/A)  Patient location during evaluation: PACU Anesthesia Type: General Level of consciousness: awake and alert Pain management: pain level controlled Vital Signs Assessment: post-procedure vital signs reviewed and stable Respiratory status: spontaneous breathing, nonlabored ventilation, respiratory function stable and patient connected to nasal cannula oxygen Cardiovascular status: blood pressure returned to baseline and stable Postop Assessment: no signs of nausea or vomiting Anesthetic complications: no     Last Vitals:  Vitals:   06/14/16 0815 06/14/16 0830  BP: 128/62 131/67  Pulse: (!) 50 (!) 44  Resp: 14 12  Temp:      Last Pain:  Vitals:   06/14/16 0830  TempSrc:   PainSc: 4    Pain Goal: Patients Stated Pain Goal: 3 (06/14/16 0830)               Salihah Peckham A

## 2016-06-15 ENCOUNTER — Encounter (HOSPITAL_COMMUNITY): Payer: Self-pay | Admitting: Gynecology

## 2016-06-17 ENCOUNTER — Encounter (HOSPITAL_COMMUNITY): Payer: Self-pay | Admitting: Vascular Surgery

## 2016-06-17 ENCOUNTER — Emergency Department (HOSPITAL_COMMUNITY)
Admission: EM | Admit: 2016-06-17 | Discharge: 2016-06-17 | Disposition: A | Discharge status: Home / Self Care | Payer: Medicare Other | Attending: Emergency Medicine | Admitting: Emergency Medicine

## 2016-06-17 ENCOUNTER — Emergency Department (HOSPITAL_COMMUNITY): Payer: Medicare Other

## 2016-06-17 DIAGNOSIS — S300XXA Contusion of lower back and pelvis, initial encounter: Secondary | ICD-10-CM | POA: Diagnosis not present

## 2016-06-17 DIAGNOSIS — Y999 Unspecified external cause status: Secondary | ICD-10-CM | POA: Insufficient documentation

## 2016-06-17 DIAGNOSIS — Y92009 Unspecified place in unspecified non-institutional (private) residence as the place of occurrence of the external cause: Secondary | ICD-10-CM

## 2016-06-17 DIAGNOSIS — J449 Chronic obstructive pulmonary disease, unspecified: Secondary | ICD-10-CM | POA: Insufficient documentation

## 2016-06-17 DIAGNOSIS — Z85828 Personal history of other malignant neoplasm of skin: Secondary | ICD-10-CM | POA: Insufficient documentation

## 2016-06-17 DIAGNOSIS — W19XXXA Unspecified fall, initial encounter: Secondary | ICD-10-CM

## 2016-06-17 DIAGNOSIS — Z87891 Personal history of nicotine dependence: Secondary | ICD-10-CM | POA: Diagnosis not present

## 2016-06-17 DIAGNOSIS — S299XXA Unspecified injury of thorax, initial encounter: Secondary | ICD-10-CM | POA: Diagnosis not present

## 2016-06-17 DIAGNOSIS — Y939 Activity, unspecified: Secondary | ICD-10-CM | POA: Insufficient documentation

## 2016-06-17 DIAGNOSIS — Y929 Unspecified place or not applicable: Secondary | ICD-10-CM | POA: Diagnosis not present

## 2016-06-17 DIAGNOSIS — S20222A Contusion of left back wall of thorax, initial encounter: Secondary | ICD-10-CM

## 2016-06-17 DIAGNOSIS — W11XXXA Fall on and from ladder, initial encounter: Secondary | ICD-10-CM | POA: Diagnosis not present

## 2016-06-17 DIAGNOSIS — M545 Low back pain: Secondary | ICD-10-CM | POA: Diagnosis not present

## 2016-06-17 DIAGNOSIS — Z8673 Personal history of transient ischemic attack (TIA), and cerebral infarction without residual deficits: Secondary | ICD-10-CM | POA: Diagnosis not present

## 2016-06-17 DIAGNOSIS — S3992XA Unspecified injury of lower back, initial encounter: Secondary | ICD-10-CM | POA: Diagnosis not present

## 2016-06-17 MED ORDER — HYDROCODONE-ACETAMINOPHEN 5-325 MG PO TABS: ORAL_TABLET | ORAL | 0 refills | Status: DC

## 2016-06-17 MED ORDER — LIDOCAINE 4 % EX CREA: 1.0000 "application " | TOPICAL_CREAM | Freq: Three times a day (TID) | CUTANEOUS | 0 refills | Status: DC | PRN

## 2016-06-17 NOTE — Discharge Instructions (Signed)
For pain control please take ibuprofen (also known as Motrin or Advil) 800mg  (this is normally 4 over the counter pills) 3 times a day  for 5 days. Take with food to minimize stomach irritation.  Take vicodin for breakthrough pain, do not drink alcohol, drive, care for children or do other critical tasks while taking vicodin.

## 2016-06-17 NOTE — ED Provider Notes (Signed)
[facility] DEPT Provider Note   CSN: [id number] Arrival date & time: 06/17/16  1213   By signing my name below, I, [name], attest that this documentation has been prepared under the direction and in the presence of non-physician practitioner, Monico Blitz, PA-C. Electronically Signed: Evelene [name], Scribe. 06/17/2016. 1:39 PM.   History   Chief Complaint Chief Complaint  Patient presents with  . Back Pain   The history is provided by the patient. No language interpreter was used.     HPI Comments:  [name] is a 55 y.o. female who presents to the Emergency Department complaining of constant, moderate, lower back pain s/p fall last night. Pt fell off a step ladder ~ 15 inches off of the ground and landed on a wooden deck. Pt notes she landed on her back. She notes her pain is greater in her left lower back/left hip region. She denies head injury and LOC. She is able to ambulate. She has taken leftover oxycodone from recent D &C  with moderate relief but notes she has run out.    Past Medical History:  Diagnosis Date  . Anxiety    BILATERAL HANDS-- LEFT >RIGHT AND LEGS  . Arthritis   . Asthma    NO INHALER  . Bed bug bite   . Blood transfusion without reported diagnosis   . CA - skin cancer   . Chronic pain due to injury RIGHT HAND/WRIST IN 2007  . Complication of anesthesia    unable to urinate after surgery  . COPD (chronic obstructive pulmonary disease) (HCC)    Stage 1  . Crush injury of hand RIGHT IN 2007   X 6 SURG'S  LAST ONE BEING TOTAL WRIST FUSION W/ BONE GRAFT AND PLATE  . Cubital tunnel syndrome    Left elbow  . Depression   . ETOH abuse   . GERD (gastroesophageal reflux disease)    TAKES TUMS PRN  . Hemorrhoids   . MRSA (methicillin resistant Staphylococcus aureus) carrier   . Neuromuscular disorder (HCC)    some fibromyalgia  . Neuropathy ([place])   . PTSD (post-traumatic stress disorder) 2007 RIGHT HAND CRUSH INJURY W/ 2 FINGER  AMPUTATION  . Stroke ([place]) X2 CVA'S  IN 1990   LOSS OF VISION RIGHT EYE  . Substance abuse    hx: narcotic use  . Weakness of right leg CAUSED BY RETRIEVAL THIGH MUSCLE FOR FLAP RIGHT HAND INJURY-- 2006   RIGHT LEG GIVES OUT OCCASIONALLY    Patient Active Problem List   Diagnosis Date Noted  . Acute sinusitis 12/13/2013  . Acute bronchitis 12/13/2013  . Laceration of finger 10/06/2013  . PTSD (post-traumatic stress disorder)     Past Surgical History:  Procedure Laterality Date  . COLON SURGERY     15 years ago- fistula on colon removed  . COSMETIC SURGERY     right leg and abdomen  . DILATATION & CURETTAGE/HYSTEROSCOPY WITH MYOSURE N/A 06/14/2016   Procedure: [place];  Surgeon: Anastasio Auerbach, MD;  Location: [facility];  Service: Gynecology;  Laterality: N/A;  . HAND CARPECTOMY  12-24-2005   RIGHT-- INCLUDING  REMOVAL  WRIST WIRES AND I &D (EXICIOSNAL)  FOR OSTEOMYELITIS  . HYSTEROSCOPY  12.6.2012   w/removal of polyp  . HYSTEROSCOPY W/D&C  08/18/2011   Procedure: DILATATION AND CURETTAGE (D&C) /HYSTEROSCOPY;  Surgeon: Anastasio Auerbach, MD;  Location: [facility];  Service: Gynecology;  Laterality: N/A;  . INCISION /  DRAINAGE HAND / FINGER  X3  (2006 & 2007)   RIGHT CRUSH WOUND  . LESION REMOVAL  08/18/2011   Procedure: EXCISION VAGINAL LESION;  Surgeon: Anastasio Auerbach, MD;  Location: [facility];  Service: Gynecology;  Laterality: N/A;  . ORIF CARPAL BONE FRACTURE  08-31-05   I & D OF RIGHT HAND CRUSH INJURY/ OPEN FX'S AND INDEX FINGER RAY RESECTION/ COMPLICATED CLOSURE  . ORIF RADIAL FRACTURE  09-04-05   DISTAL RADIAL PLATE AND PARTIAL THUMB AMPUTATION  . RIGHT TOTAL WRIST FUSION W/ BONE GRAFT AND PLATE  075-GRM   AND FLAP USING RIGHT GOIN MUSCLE  . skin cancer removal  2007   throat area  . TUBAL LIGATION  15 YRS AGO    OB History    Gravida Para Term Preterm AB Living   6 6       6    SAB  TAB Ectopic Multiple Live Births                   Home Medications    Prior to Admission medications   Medication Sig Start Date End Date Taking? Authorizing Provider  gabapentin (NEURONTIN) 300 MG capsule TAKE ONE CAPSULE BY MOUTH 3 TIMES A DAY 04/08/16   Susy Frizzle, MD  HYDROcodone-acetaminophen (NORCO/VICODIN) 5-325 MG tablet Take 1-2 tablets by mouth every 6 hours as needed for pain. 06/17/16   Glenda Kunst, PA-C  lidocaine (LMX) 4 % cream Apply 1 application topically 3 (three) times daily as needed. 06/17/16   Yafet Cline, PA-C  mesalamine (CANASA) 1000 MG suppository Place 1 suppository (1,000 mg total) rectally at bedtime. 11/20/15   Susy Frizzle, MD  pyridoxine (B-6) 200 MG tablet Take 200 mg by mouth daily.    Historical Provider, MD  vitamin B-12 (CYANOCOBALAMIN) 500 MCG tablet Take 500 mcg by mouth daily.    Historical Provider, MD    Family History Family History  Problem Relation Age of Onset  . Lupus Mother     died at 62  . Breast cancer Maternal Aunt 35  . Breast cancer Maternal Grandmother 60  . Stomach cancer Paternal Grandfather   . Colon cancer Neg Hx   . Rectal cancer Neg Hx   . Esophageal cancer Neg Hx     Social History Social History  Substance Use Topics  . Smoking status: Former Smoker    Packs/day: 1.50    Years: 28.00    Types: Cigarettes    Quit date: 11/29/2001  . Smokeless tobacco: Never Used  . Alcohol use 0.0 oz/week     Comment: 4 drinks a week     Allergies   Aspirin and Codeine   Review of Systems Review of Systems  Musculoskeletal: Positive for back pain.  Neurological: Negative for dizziness, syncope, weakness and numbness.   10 systems reviewed and found to be negative, except as noted in the HPI.   Physical Exam Updated Vital Signs BP 127/70 (BP Location: Left Arm)   Pulse (!) 58   Temp 98.1 F (36.7 C) (Oral)   Resp 18   SpO2 98%   Physical Exam  Constitutional: She is oriented to person,  place, and time. She appears well-developed and well-nourished. No distress.  HENT:  Head: Normocephalic and atraumatic.  Mouth/Throat: Oropharynx is clear and moist.  Eyes: Conjunctivae and EOM are normal. Pupils are equal, round, and reactive to light.  Neck: Normal range of motion.  No midline C-spine  tenderness to palpation  or step-offs appreciated. Patient has full range of motion without pain.  Grip strength, biceps, triceps 5/5 bilaterally;  can differentiate between pinprick and light touch bilaterally.   Cardiovascular: Normal rate, regular rhythm and intact distal pulses.  Exam reveals no gallop and no friction rub.   No murmur heard. Pulmonary/Chest: Effort normal and breath sounds normal. No respiratory distress. She has no wheezes. She has no rales. She exhibits no tenderness.  Abdominal: Soft. She exhibits no distension. There is no tenderness.  Musculoskeletal: Normal range of motion.  Left hand with no deformity, no snuffbox tenderness, distally neurovascularly intact.  Right hand with remote trauma and multiple amputations.  No point tenderness to percussion of lumbar spinal processes.  No TTP or paraspinal muscular spasm. Strength is 5 out of 5 to bilateral lower extremities at hip and knee; extensor hallucis longus 5 out of 5. Ankle strength 5 out of 5, no clonus, neurovascularly intact. No saddle anaesthesia. Patellar reflexes are 2+ bilaterally.   Left hip with no tenderness palpation along the greater trochanter, able to abduct with mild pain.      Neurological: She is alert and oriented to person, place, and time.  Skin: Skin is warm and dry. She is not diaphoretic.  Psychiatric: She has a normal mood and affect.  Nursing note and vitals reviewed.    ED Treatments / Results   DIAGNOSTIC STUDIES:  Oxygen Saturation is 94% on RA, adequate by my interpretation.    COORDINATION OF CARE:  1:20 PM Discussed treatment plan with pt at bedside and pt agreed to  plan.  Labs (all labs ordered are listed, but only abnormal results are displayed) Labs Reviewed - No data to display  EKG  EKG Interpretation None       Radiology Dg Thoracic Spine 4v  Result Date: 06/17/2016 CLINICAL DATA:  Patient fell backwards.  Low back pain. EXAM: THORACIC SPINE - 4+ VIEW COMPARISON:  None. FINDINGS: There is no evidence of thoracic spine fracture. Alignment is normal. No other significant bone abnormalities are identified. IMPRESSION: Negative. Electronically Signed   By: Ashley Royalty M.D.   On: 06/17/2016 13:36   Dg Lumbar Spine Complete  Result Date: 06/17/2016 CLINICAL DATA:  Patient fell backwards last evening. Pain across lower back. Unable to bend over. EXAM: LUMBAR SPINE - COMPLETE 4+ VIEW COMPARISON:  Thoracic spine radiographs from the same day FINDINGS: There appear to be rudimentary ribs associated with what will be labeled L1 for numbering purposes. The twelfth ribs appear to be long ribs on the thoracic spine radiographs. There is no evidence of lumbar spine fracture. No significant disc space narrowing. Pseudoarticulation of L5 with S1 bilaterally involving the transverse processes. There is lumbar facet arthropathy from L3 through S1. IMPRESSION: No acute osseous abnormality.  Lumbar facet arthropathy. Electronically Signed   By: Ashley Royalty M.D.   On: 06/17/2016 13:35    Procedures Procedures (including critical care time)  Medications Ordered in ED Medications - No data to display   Initial Impression / Assessment and Plan / ED Course  I have reviewed the triage vital signs and the nursing notes.  Pertinent labs & imaging results that were available during my care of the patient were reviewed by me and considered in my medical decision making (see chart for details).  Clinical Course    Vitals:   06/17/16 1222 06/17/16 1358  BP: 131/79 127/70  Pulse: 65 (!) 58  Resp: 16 18  Temp: 98.1 F (36.7 C) 98.1  F (36.7 C)  TempSrc: Oral  Oral  SpO2: 94% 98%    Medications - No data to display  [name] is 55 y.o. female presenting with Low back pain after falling approximately 15 inches last night while on a step stool. She impacted her low back. She also has some left rib pain wrist pain however, she has no snuffbox tenderness palpation. Imaging of the lumbar and thoracic spines are negative. Patient will be given Vicodin, Lidoderm cream for comfort. I've offered her a work note however she declined states she has to work.  Evaluation does not show pathology that would require ongoing emergent intervention or inpatient treatment. Pt is hemodynamically stable and mentating appropriately. Discussed findings and plan with patient/guardian, who agrees with care plan. All questions answered. Return precautions discussed and outpatient follow up given.      Final Clinical Impressions(s) / ED Diagnoses   Final diagnoses:  Contusion of left side of back, initial encounter  Fall at home, initial encounter    New Prescriptions Discharge Medication List as of 06/17/2016  1:53 PM    START taking these medications   Details  HYDROcodone-acetaminophen (NORCO/VICODIN) 5-325 MG tablet Take 1-2 tablets by mouth every 6 hours as needed for pain., Print    lidocaine (LMX) 4 % cream Apply 1 application topically 3 (three) times daily as needed., Starting Fri 06/17/2016, Print        I personally performed the services described in this documentation, which was scribed in my presence. The recorded information has been reviewed and is accurate.     Monico Blitz, PA-C 06/17/16 [place], MD 06/18/16 [phone]

## 2016-06-17 NOTE — ED Triage Notes (Signed)
Pt reports to the ED for eval of low back pain following a fall that occurred last night. She was on a small step ladder and fell from just a few stairs up but she hit her back on the ground. She denies any LOC, numbness, tingling, paralysis, or bowel or bladder changes. She went to work today and was able to finish her shift but she is still having pain so she came in to be checked.

## 2016-06-21 ENCOUNTER — Telehealth: Payer: Self-pay

## 2016-06-21 NOTE — Telephone Encounter (Signed)
Patient called from work. She said I cannot call her there and she will not be home until "late this afternoon". She asked me to leave a voice mail message at home with recommendation. So I have not spoken with her I just have voice mail message she left me.  Patient had D&C Hysteroscopy last week and today is complaining of "cramping and bleeding".  She said she fell off a ladder at work last Thursday and had to go to ER.

## 2016-06-21 NOTE — Telephone Encounter (Signed)
Agree with OV

## 2016-06-21 NOTE — Telephone Encounter (Signed)
I called patient. She said she has not had much bleeding today. Mostly spotting but this morning was enough to drip in toilet.  She has had cramping today. She fell off ladder at work last week onto her tail bone and had x-rays of her back etc. She is taking oxycodone for that pain.  Patient said she just does not feel good and would just like to know all okay. She said she is off tomorrow and I asked her if she would like to come in. Appt scheduled w [place] at noon tomorrow.

## 2016-06-22 ENCOUNTER — Ambulatory Visit (INDEPENDENT_AMBULATORY_CARE_PROVIDER_SITE_OTHER): Payer: Medicare Other | Admitting: Women's Health

## 2016-06-22 ENCOUNTER — Encounter: Payer: Self-pay | Admitting: Women's Health

## 2016-06-22 VITALS — BP 128/80 | Ht 62.0 in | Wt 137.0 lb

## 2016-06-22 DIAGNOSIS — N898 Other specified noninflammatory disorders of vagina: Secondary | ICD-10-CM

## 2016-06-22 DIAGNOSIS — N938 Other specified abnormal uterine and vaginal bleeding: Secondary | ICD-10-CM

## 2016-06-22 LAB — WET PREP FOR TRICH, YEAST, CLUE
CLUE CELLS WET PREP: NONE SEEN
TRICH WET PREP: NONE SEEN
WBC, Wet Prep HPF POC: NONE SEEN
Yeast Wet Prep HPF POC: NONE SEEN

## 2016-06-22 NOTE — Progress Notes (Signed)
Presents with vaginal bleeding and low abdominal cramping. 06/14/16 D& E for benign endometrial polyp. 06/17/2016 had a fall reports landed on coccyx,  no fractures found at  hospital. Now complaining of abdominal cramping, bloating, back pain, and spotting. Denies vaginal discharge with itching or odor, fever or constipation.   Exam: Appears well. External genitalia: atrophic. Speculum exam: Atrophic vaginal wall with scant dark flecks of blood,  Wet Prep: negative.   Low back/abdominal Pain related to Fall Post-procedural Bleeding  Plan: Motrin 600 mg over the counter for pain. Reassurance given. Follow-up appointment with Dr Phineas Real  next week.

## 2016-06-22 NOTE — Addendum Note (Signed)
Addended by: Burnett Kanaris on: 06/22/2016 02:15 PM   Modules accepted: Orders

## 2016-06-22 NOTE — Patient Instructions (Signed)

## 2016-06-30 ENCOUNTER — Ambulatory Visit (INDEPENDENT_AMBULATORY_CARE_PROVIDER_SITE_OTHER): Payer: Medicare Other | Admitting: Family Medicine

## 2016-06-30 ENCOUNTER — Encounter: Payer: Self-pay | Admitting: Family Medicine

## 2016-06-30 ENCOUNTER — Encounter: Payer: Self-pay | Admitting: Gynecology

## 2016-06-30 ENCOUNTER — Ambulatory Visit (INDEPENDENT_AMBULATORY_CARE_PROVIDER_SITE_OTHER): Payer: Medicare Other | Admitting: Gynecology

## 2016-06-30 VITALS — BP 154/100 | HR 68 | Temp 99.0°F | Resp 16 | Ht 62.0 in | Wt 136.0 lb

## 2016-06-30 VITALS — BP 124/80

## 2016-06-30 DIAGNOSIS — M545 Low back pain, unspecified: Secondary | ICD-10-CM

## 2016-06-30 DIAGNOSIS — Z9889 Other specified postprocedural states: Secondary | ICD-10-CM

## 2016-06-30 MED ORDER — PREDNISONE 20 MG PO TABS: ORAL_TABLET | ORAL | 0 refills | Status: DC

## 2016-06-30 MED ORDER — CYCLOBENZAPRINE HCL 10 MG PO TABS: 10.0000 mg | ORAL_TABLET | Freq: Three times a day (TID) | ORAL | 0 refills | Status: DC | PRN

## 2016-06-30 NOTE — Patient Instructions (Signed)
Follow up when you're due for your next annual exam in August 2018

## 2016-06-30 NOTE — Progress Notes (Signed)
Subjective:    Patient ID: [name], female    DOB: Mar 14, 1961, 55 y.o.   MRN: [id number]  HPI 2 weeks ago, the patient fell off a ladder landing on her lower back. She went to the emergency room where x-rays of the thoracic and lumbar spine were negative for any fracture. She's been taking pain medication ever since.  The pain is not improving. The pain is located around the level of L5 and radiates into both hips. However on examination today she has full internal and Rotation of both hips along with full flexion without any pain. She does have palpable muscle spasms in the bilateral lumbar paraspinal muscles. She denies any cauda equina syndrome symptoms. She denies any bladder or bowel incontinence or saddle anesthesia Past Medical History:  Diagnosis Date  . Anxiety    BILATERAL HANDS-- LEFT >RIGHT AND LEGS  . Arthritis   . Asthma    NO INHALER  . Bed bug bite   . Blood transfusion without reported diagnosis   . CA - skin cancer   . Chronic pain due to injury RIGHT HAND/WRIST IN 2007  . Complication of anesthesia    unable to urinate after surgery  . COPD (chronic obstructive pulmonary disease) (HCC)    Stage 1  . Crush injury of hand RIGHT IN 2007   X 6 SURG'S  LAST ONE BEING TOTAL WRIST FUSION W/ BONE GRAFT AND PLATE  . Cubital tunnel syndrome    Left elbow  . Depression   . ETOH abuse   . GERD (gastroesophageal reflux disease)    TAKES TUMS PRN  . Hemorrhoids   . MRSA (methicillin resistant Staphylococcus aureus) carrier   . Neuromuscular disorder (HCC)    some fibromyalgia  . Neuropathy ([place])   . PTSD (post-traumatic stress disorder) 2007 RIGHT HAND CRUSH INJURY W/ 2 FINGER AMPUTATION  . Stroke ([place]) X2 CVA'S  IN 1990   LOSS OF VISION RIGHT EYE  . Substance abuse    hx: narcotic use  . Weakness of right leg CAUSED BY RETRIEVAL THIGH MUSCLE FOR FLAP RIGHT HAND INJURY-- 2006   RIGHT LEG GIVES OUT OCCASIONALLY   Past Surgical History:  Procedure Laterality  Date  . COLON SURGERY     15 years ago- fistula on colon removed  . COSMETIC SURGERY     right leg and abdomen  . DILATATION & CURETTAGE/HYSTEROSCOPY WITH MYOSURE N/A 06/14/2016   Procedure: [place];  Surgeon: Anastasio Auerbach, MD;  Location: [facility];  Service: Gynecology;  Laterality: N/A;  . HAND CARPECTOMY  12-24-2005   RIGHT-- INCLUDING  REMOVAL  WRIST WIRES AND I &D (EXICIOSNAL)  FOR OSTEOMYELITIS  . HYSTEROSCOPY  12.6.2012   w/removal of polyp  . HYSTEROSCOPY W/D&C  08/18/2011   Procedure: DILATATION AND CURETTAGE (D&C) /HYSTEROSCOPY;  Surgeon: Anastasio Auerbach, MD;  Location: [facility];  Service: Gynecology;  Laterality: N/A;  . INCISION / DRAINAGE HAND / FINGER  X3  (2006 & 2007)   RIGHT CRUSH WOUND  . LESION REMOVAL  08/18/2011   Procedure: EXCISION VAGINAL LESION;  Surgeon: Anastasio Auerbach, MD;  Location: [facility];  Service: Gynecology;  Laterality: N/A;  . ORIF CARPAL BONE FRACTURE  08-31-05   I & D OF RIGHT HAND CRUSH INJURY/ OPEN FX'S AND INDEX FINGER RAY RESECTION/ COMPLICATED CLOSURE  . ORIF RADIAL FRACTURE  09-04-05   DISTAL RADIAL PLATE AND PARTIAL THUMB AMPUTATION  . RIGHT  TOTAL WRIST FUSION W/ BONE GRAFT AND PLATE  075-GRM   AND FLAP USING RIGHT GOIN MUSCLE  . skin cancer removal  2007   throat area  . TUBAL LIGATION  15 YRS AGO   Current Outpatient Prescriptions on File Prior to Visit  Medication Sig Dispense Refill  . gabapentin (NEURONTIN) 300 MG capsule TAKE ONE CAPSULE BY MOUTH 3 TIMES A DAY 270 capsule 0  . mesalamine (CANASA) 1000 MG suppository Place 1 suppository (1,000 mg total) rectally at bedtime. 30 suppository 12  . pyridoxine (B-6) 200 MG tablet Take 200 mg by mouth daily.    . vitamin B-12 (CYANOCOBALAMIN) 500 MCG tablet Take 500 mcg by mouth daily.    Marland Kitchen HYDROcodone-acetaminophen (NORCO/VICODIN) 5-325 MG tablet Take 1-2 tablets by mouth every 6 hours as needed for  pain. (Patient not taking: Reported on 06/30/2016) 15 tablet 0  . lidocaine (LMX) 4 % cream Apply 1 application topically 3 (three) times daily as needed. (Patient not taking: Reported on 06/30/2016) 133 g 0   No current facility-administered medications on file prior to visit.    Allergies  Allergen Reactions  . Aspirin Shortness Of Breath    ASTHMATIC ATTACK  . Codeine Other (See Comments)    ABNORMAL BEHAVIOR  Increased in bad moods    Social History   Social History  . Marital status: Single    Spouse name: N/A  . Number of children: 6  . Years of education: N/A   Occupational History  . Housekeeper     Social History Main Topics  . Smoking status: Former Smoker    Packs/day: 1.50    Years: 28.00    Types: Cigarettes    Quit date: 11/29/2001  . Smokeless tobacco: Never Used  . Alcohol use 0.0 oz/week     Comment: 4 drinks a week  . Drug use:     Types: Marijuana     Comment: 10-09-12- pt has quit  . Sexual activity: Yes    Partners: Male    Birth control/ protection: Post-menopausal     Comment: 1st intercourse 55 yo-More than 5 partners   Other Topics Concern  . Not on file   Social History Narrative  . No narrative on file      Review of Systems  All other systems reviewed and are negative.      Objective:   Physical Exam  Constitutional: She appears well-developed and well-nourished.  Cardiovascular: Normal rate, regular rhythm and normal heart sounds.   Pulmonary/Chest: Effort normal and breath sounds normal.  Musculoskeletal:       Lumbar back: She exhibits decreased range of motion, tenderness, pain and spasm.          Assessment & Plan:  Acute midline low back pain without sciatica - Plan: predniSONE (DELTASONE) 20 MG tablet, cyclobenzaprine (FLEXERIL) 10 MG tablet  Initial diagnosis includes a torn muscle in the lower back versus a herniated disc in her lower back. Begin prednisone taper pack with Flexeril 10 mg every 8 hours as needed.  Recheck in one week or sooner if worse. Consider MRI of if symptoms persist

## 2016-06-30 NOTE — Progress Notes (Signed)
    [name] 1960-11-18 [id number]        55 y.o.  G6P6 presents for her postoperative visit status post hysteroscopic resection of endometrial polyps. Has done well after surgery  Past medical history,surgical history, problem list, medications, allergies, family history and social history were all reviewed and documented in the EPIC chart.  Directed ROS with pertinent positives and negatives documented in the history of present illness/assessment and plan.  Exam: [name] assistant Vitals:   06/30/16 1446  BP: 124/80   General appearance:  Normal Abdomen soft nontender without masses guarding rebound Pelvic external BUS vagina with atrophic changes. Cervix grossly normal. Uterus normal size midline mobile nontender. Adnexa without masses or tenderness.  Assessment/Plan:  55 y.o. G6P6 with normal postoperative visit status post hysteroscopic resection of endometrial polyps. Reviewed benign pathology with her as well as pictures from the surgery. Assuming she continues will she'll follow up in August when due for annual exam, sooner as needed.    Ann Auerbach MD, 2:59 PM 06/30/2016

## 2016-07-13 DIAGNOSIS — M25522 Pain in left elbow: Secondary | ICD-10-CM | POA: Diagnosis not present

## 2016-07-13 DIAGNOSIS — G5622 Lesion of ulnar nerve, left upper limb: Secondary | ICD-10-CM | POA: Diagnosis not present

## 2016-07-28 ENCOUNTER — Other Ambulatory Visit: Payer: Self-pay | Admitting: Family Medicine

## 2016-07-28 DIAGNOSIS — Z1231 Encounter for screening mammogram for malignant neoplasm of breast: Secondary | ICD-10-CM

## 2016-08-10 DIAGNOSIS — M25522 Pain in left elbow: Secondary | ICD-10-CM | POA: Diagnosis not present

## 2016-08-10 DIAGNOSIS — G5622 Lesion of ulnar nerve, left upper limb: Secondary | ICD-10-CM | POA: Diagnosis not present

## 2016-08-10 DIAGNOSIS — M1812 Unilateral primary osteoarthritis of first carpometacarpal joint, left hand: Secondary | ICD-10-CM | POA: Diagnosis not present

## 2016-08-17 ENCOUNTER — Ambulatory Visit (INDEPENDENT_AMBULATORY_CARE_PROVIDER_SITE_OTHER): Payer: Medicare Other | Admitting: Physician Assistant

## 2016-08-17 ENCOUNTER — Encounter: Payer: Self-pay | Admitting: Physician Assistant

## 2016-08-17 VITALS — BP 118/80 | HR 61 | Temp 97.5°F | Resp 16 | Wt 138.0 lb

## 2016-08-17 DIAGNOSIS — L0231 Cutaneous abscess of buttock: Secondary | ICD-10-CM | POA: Diagnosis not present

## 2016-08-17 MED ORDER — SULFAMETHOXAZOLE-TRIMETHOPRIM 800-160 MG PO TABS: 1.0000 | ORAL_TABLET | Freq: Two times a day (BID) | ORAL | 0 refills | Status: DC

## 2016-08-17 NOTE — Progress Notes (Signed)
    Patient ID: [name] MRN: [id number], DOB: 04/20/1961, 55 y.o. Date of Encounter: 08/17/2016, 3:19 PM    Chief Complaint:  Chief Complaint  Patient presents with  . spot on right leg     HPI: 55 y.o. year old female presents secondary to abscess on right buttock. She states that she has never had any abscess/boil in the past. Says that she noticed what she thought was a bug bite in this area about 6 days ago. "Bump" has gotten worse since then. Has had no fevers or chills. No other complaints or concerns.     Home Meds:   Outpatient Medications Prior to Visit  Medication Sig Dispense Refill  . gabapentin (NEURONTIN) 300 MG capsule TAKE ONE CAPSULE BY MOUTH 3 TIMES A DAY 270 capsule 0  . mesalamine (CANASA) 1000 MG suppository Place 1 suppository (1,000 mg total) rectally at bedtime. 30 suppository 12  . pyridoxine (B-6) 200 MG tablet Take 200 mg by mouth daily.    . vitamin B-12 (CYANOCOBALAMIN) 500 MCG tablet Take 500 mcg by mouth daily.    . cyclobenzaprine (FLEXERIL) 10 MG tablet Take 1 tablet (10 mg total) by mouth 3 (three) times daily as needed for muscle spasms. (Patient not taking: Reported on 08/17/2016) 30 tablet 0  . predniSONE (DELTASONE) 20 MG tablet 3 tabs poqday 1-2, 2 tabs poqday 3-4, 1 tab poqday 5-6 (Patient not taking: Reported on 08/17/2016) 12 tablet 0   No facility-administered medications prior to visit.     Allergies:  Allergies  Allergen Reactions  . Aspirin Shortness Of Breath    ASTHMATIC ATTACK  . Codeine Other (See Comments)    ABNORMAL BEHAVIOR  Increased in bad moods       Review of Systems: See HPI for pertinent ROS. All other ROS negative.    Physical Exam: Blood pressure 118/80, pulse 61, temperature 97.5 F (36.4 C), temperature source Oral, resp. rate 16, weight 138 lb (62.6 kg), SpO2 98 %., Body mass index is 25.24 kg/m. General:  WNWD WF. Appears in no acute distress. Neck: Supple. No thyromegaly. No  lymphadenopathy. Lungs: Clear bilaterally to auscultation without wheezes, rales, or rhonchi. Breathing is unlabored. Heart: Regular rhythm. No murmurs, rubs, or gallops. Msk:  Strength and tone normal for age. Skin: Right Buttock: There is abscess on lower aspect of right buttock. It has been draining. There is central area that is raised slightly--this measures ~ 2cm diameter. There is area of diffuse erythema and firmness surrounding this--this measures ~ 2inch diameter. Neuro: Alert and oriented X 3. Moves all extremities spontaneously. Gait is normal. CNII-XII grossly in tact. Psych:  Responds to questions appropriately with a normal affect.     ASSESSMENT AND PLAN:  55 y.o. year old female with  1. Abscess of right buttock Site was cleansed with Betadine. It was anesthetized with epi/lidocaine 2%. Site was incised and drained. She is to start antibiotic immediately take as directed and complete all of it. If site worsens or does not resolve upon completion of antibiotic and she is to follow-up. - sulfamethoxazole-trimethoprim (BACTRIM DS,SEPTRA DS) 800-160 MG tablet; Take 1 tablet by mouth 2 (two) times daily.  Dispense: 20 tablet; Refill: 0   Signed, [address] [place], [place], [facility] 08/17/2016 3:19 PM

## 2016-08-29 ENCOUNTER — Other Ambulatory Visit: Payer: Self-pay | Admitting: Family Medicine

## 2016-09-07 ENCOUNTER — Ambulatory Visit
Admission: RE | Admit: 2016-09-07 | Discharge: 2016-09-07 | Disposition: A | Discharge status: Home / Self Care | Payer: Medicare Other | Source: Ambulatory Visit | Attending: Family Medicine | Admitting: Family Medicine

## 2016-09-07 DIAGNOSIS — Z1231 Encounter for screening mammogram for malignant neoplasm of breast: Secondary | ICD-10-CM

## 2016-09-14 DIAGNOSIS — M79642 Pain in left hand: Secondary | ICD-10-CM | POA: Diagnosis not present

## 2016-09-14 DIAGNOSIS — M1812 Unilateral primary osteoarthritis of first carpometacarpal joint, left hand: Secondary | ICD-10-CM | POA: Diagnosis not present

## 2016-10-12 ENCOUNTER — Ambulatory Visit (INDEPENDENT_AMBULATORY_CARE_PROVIDER_SITE_OTHER): Payer: Medicare Other | Admitting: Family Medicine

## 2016-10-12 ENCOUNTER — Encounter: Payer: Self-pay | Admitting: Family Medicine

## 2016-10-12 VITALS — BP 122/64 | HR 70 | Temp 100.2°F | Resp 18 | Ht 62.0 in | Wt 138.0 lb

## 2016-10-12 DIAGNOSIS — J449 Chronic obstructive pulmonary disease, unspecified: Secondary | ICD-10-CM

## 2016-10-12 DIAGNOSIS — J111 Influenza due to unidentified influenza virus with other respiratory manifestations: Secondary | ICD-10-CM

## 2016-10-12 DIAGNOSIS — R509 Fever, unspecified: Secondary | ICD-10-CM | POA: Diagnosis not present

## 2016-10-12 LAB — INFLUENZA A AND B AG, IMMUNOASSAY
INFLUENZA B ANTIGEN: NOT DETECTED
Influenza A Antigen: NOT DETECTED

## 2016-10-12 MED ORDER — PREDNISONE 20 MG PO TABS: 40.0000 mg | ORAL_TABLET | Freq: Every day | ORAL | 0 refills | Status: DC

## 2016-10-12 MED ORDER — ALBUTEROL SULFATE HFA 108 (90 BASE) MCG/ACT IN AERS: 2.0000 | INHALATION_SPRAY | Freq: Four times a day (QID) | RESPIRATORY_TRACT | 1 refills | Status: DC | PRN

## 2016-10-12 MED ORDER — GUAIFENESIN-CODEINE 100-10 MG/5ML PO SOLN: 5.0000 mL | Freq: Four times a day (QID) | ORAL | 0 refills | Status: DC | PRN

## 2016-10-12 MED ORDER — OSELTAMIVIR PHOSPHATE 75 MG PO CAPS: 75.0000 mg | ORAL_CAPSULE | Freq: Two times a day (BID) | ORAL | 0 refills | Status: DC

## 2016-10-12 NOTE — Progress Notes (Signed)
   Subjective:    Patient ID: [name], female    DOB: 1961/01/01, 56 y.o.   MRN: [id number]  Patient presents for Illness (x3 days- productive cough, SOB, fever (T max 101))   Monday starting get short of breath,fever 101 F   no nasal drip, more cough thick mucous  non smoker, mild wheeze  Taking sudafed Which has not helped much. She does have history of mild COPD she states that she ran out of her rescue inhaler but it typically helps when she gets like this. 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   Review Of Systems:  GEN- denies fatigue,+ fever, weight loss,weakness, recent illness HEENT- denies eye drainage, change in vision, nasal discharge, CVS- denies chest pain, palpitations RESP- +SOB, +cough, wheeze ABD- denies N/V, change in stools, abd pain GU- denies dysuria, hematuria, dribbling, incontinence MSK- denies joint pain, +muscle aches, injury Neuro- denies headache, dizziness, syncope, seizure activity       Objective:    BP 122/64 (BP Location: Left Arm, Patient Position: Sitting, Cuff Size: Normal)   Pulse 70   Temp 100.2 F (37.9 C) (Oral)   Resp 18   Ht 5\' 2"  (1.575 m)   Wt 138 lb (62.6 kg)   SpO2 94%   BMI 25.24 kg/m  GEN- NAD, alert and oriented x3,ill appearing  HEENT- PERRL, EOMI, non injected sclera, pink conjunctiva, MMM, oropharynx clear, nares clear  Neck- Supple, no LAD  CVS- RRR, no murmur RESP-CTAB, no retractions  ABD-NABS,soft,NT,ND EXT- No edema Pulses- Radial 2+        Assessment & Plan:      Problem List Items Addressed This Visit    None    Visit Diagnoses    Influenza    -  Primary   Concern for influenza and she also has mild underlying COPD. She has had a positive exposure to put her on Tamiflu. She is high risk for decompensation based on her medical problems.    Relevant Medications   oseltamivir (TAMIFLU) 75 MG capsule   Other Relevant Orders   Influenza A and B Ag, Immunoassay  (Completed)   COPD, mild (HCC)       Mild COPD this with the like symptoms may be contributing to her shortness of breath. I've given her albuterol and prednisone   Relevant Medications   albuterol (PROVENTIL HFA;VENTOLIN HFA) 108 (90 Base) MCG/ACT inhaler   predniSONE (DELTASONE) 20 MG tablet   guaiFENesin-codeine 100-10 MG/5ML syrup      Note: This dictation was prepared with Dragon dictation along with smaller phrase technology. Any transcriptional errors that result from this process are unintentional.

## 2016-10-12 NOTE — Patient Instructions (Addendum)
Given Tamiflu Albuterol inhaler given  Cough syrup Take predmisone

## 2016-11-15 ENCOUNTER — Ambulatory Visit (INDEPENDENT_AMBULATORY_CARE_PROVIDER_SITE_OTHER): Payer: Medicare Other | Admitting: Family Medicine

## 2016-11-15 ENCOUNTER — Encounter: Payer: Self-pay | Admitting: Family Medicine

## 2016-11-15 VITALS — BP 126/72 | HR 68 | Temp 98.2°F | Resp 18 | Ht 62.0 in | Wt 140.0 lb

## 2016-11-15 DIAGNOSIS — J449 Chronic obstructive pulmonary disease, unspecified: Secondary | ICD-10-CM

## 2016-11-15 DIAGNOSIS — J439 Emphysema, unspecified: Secondary | ICD-10-CM

## 2016-11-15 NOTE — Progress Notes (Signed)
Subjective:    Patient ID: [name], female    DOB: 03-08-1961, 56 y.o.   MRN: [id number]  HPI  The patient has a history of a DUI. Because of her DUI, she has to have a device in her car that she must blow into before driving. She must blow into this device for more than 5 seconds. Because of her underlying COPD, she states she has a very difficult time doing this. Several times she has failed.  I perform pulmonary function tests today. Her FEV1 is 1.59 L which is 68% of predicted. Her FVC is 3.16 L which is 112% of predicted. Her FEV1 percentage is 50%. This would qualify the patient for stage II COPD. This was reproducible on 3 trials.   Past Medical History:  Diagnosis Date  . Anxiety    BILATERAL HANDS-- LEFT >RIGHT AND LEGS  . Arthritis   . Asthma    NO INHALER  . Bed bug bite   . Blood transfusion without reported diagnosis   . CA - skin cancer   . Chronic pain due to injury RIGHT HAND/WRIST IN 2007  . Complication of anesthesia    unable to urinate after surgery  . COPD (chronic obstructive pulmonary disease) (HCC)    Stage 1  . Crush injury of hand RIGHT IN 2007   X 6 SURG'S  LAST ONE BEING TOTAL WRIST FUSION W/ BONE GRAFT AND PLATE  . Cubital tunnel syndrome    Left elbow  . Depression   . ETOH abuse   . GERD (gastroesophageal reflux disease)    TAKES TUMS PRN  . Hemorrhoids   . MRSA (methicillin resistant Staphylococcus aureus) carrier   . Neuromuscular disorder (HCC)    some fibromyalgia  . Neuropathy ([place])   . PTSD (post-traumatic stress disorder) 2007 RIGHT HAND CRUSH INJURY W/ 2 FINGER AMPUTATION  . Stroke ([place]) X2 CVA'S  IN 1990   LOSS OF VISION RIGHT EYE  . Substance abuse    hx: narcotic use  . Weakness of right leg CAUSED BY RETRIEVAL THIGH MUSCLE FOR FLAP RIGHT HAND INJURY-- 2006   RIGHT LEG GIVES OUT OCCASIONALLY   Past Surgical History:  Procedure Laterality Date  . COLON SURGERY     15 years ago- fistula on colon removed  . COSMETIC  SURGERY     right leg and abdomen  . DILATATION & CURETTAGE/HYSTEROSCOPY WITH MYOSURE N/A 06/14/2016   Procedure: [place];  Surgeon: Anastasio Auerbach, MD;  Location: [facility];  Service: Gynecology;  Laterality: N/A;  . HAND CARPECTOMY  12-24-2005   RIGHT-- INCLUDING  REMOVAL  WRIST WIRES AND I &D (EXICIOSNAL)  FOR OSTEOMYELITIS  . HYSTEROSCOPY  12.6.2012   w/removal of polyp  . HYSTEROSCOPY W/D&C  08/18/2011   Procedure: DILATATION AND CURETTAGE (D&C) /HYSTEROSCOPY;  Surgeon: Anastasio Auerbach, MD;  Location: [facility];  Service: Gynecology;  Laterality: N/A;  . INCISION / DRAINAGE HAND / FINGER  X3  (2006 & 2007)   RIGHT CRUSH WOUND  . LESION REMOVAL  08/18/2011   Procedure: EXCISION VAGINAL LESION;  Surgeon: Anastasio Auerbach, MD;  Location: [facility];  Service: Gynecology;  Laterality: N/A;  . ORIF CARPAL BONE FRACTURE  08-31-05   I & D OF RIGHT HAND CRUSH INJURY/ OPEN FX'S AND INDEX FINGER RAY RESECTION/ COMPLICATED CLOSURE  . ORIF RADIAL FRACTURE  09-04-05   DISTAL RADIAL PLATE AND PARTIAL THUMB AMPUTATION  . RIGHT  TOTAL WRIST FUSION W/ BONE GRAFT AND PLATE  [id number]   AND FLAP USING RIGHT GOIN MUSCLE  . skin cancer removal  2007   throat area  . TUBAL LIGATION  15 YRS AGO   Current Outpatient Prescriptions on File Prior to Visit  Medication Sig Dispense Refill  . albuterol (PROVENTIL HFA;VENTOLIN HFA) 108 (90 Base) MCG/ACT inhaler Inhale 2 puffs into the lungs every 6 (six) hours as needed for wheezing or shortness of breath. 1 Inhaler 1  . gabapentin (NEURONTIN) 300 MG capsule TAKE ONE CAPSULE BY MOUTH 3 TIMES A DAY 270 capsule 1  . guaiFENesin-codeine 100-10 MG/5ML syrup Take 5 mLs by mouth every 6 (six) hours as needed. 120 mL 0  . mesalamine (CANASA) 1000 MG suppository Place 1 suppository (1,000 mg total) rectally at bedtime. 30 suppository 12  . oseltamivir (TAMIFLU) 75 MG capsule Take 1 capsule (75  mg total) by mouth 2 (two) times daily. 10 capsule 0  . predniSONE (DELTASONE) 20 MG tablet Take 2 tablets (40 mg total) by mouth daily with breakfast. For 5 days (Patient not taking: Reported on 11/15/2016) 10 tablet 0  . vitamin B-12 (CYANOCOBALAMIN) 500 MCG tablet Take 500 mcg by mouth daily.     No current facility-administered medications on file prior to visit.    Allergies  Allergen Reactions  . Aspirin Shortness Of Breath    ASTHMATIC ATTACK  . Codeine Other (See Comments)    ABNORMAL BEHAVIOR  Increased in bad moods    Social History   Social History  . Marital status: Single    Spouse name: N/A  . Number of children: 6  . Years of education: N/A   Occupational History  . Housekeeper     Social History Main Topics  . Smoking status: Former Smoker    Packs/day: 1.50    Years: 28.00    Types: Cigarettes    Quit date: 11/29/2001  . Smokeless tobacco: Never Used  . Alcohol use 0.0 oz/week     Comment: 4 drinks a week  . Drug use: Yes    Types: Marijuana     Comment: 10-09-12- pt has quit  . Sexual activity: Yes    Partners: Male    Birth control/ protection: Post-menopausal     Comment: 1st intercourse 56 yo-More than 5 partners   Other Topics Concern  . Not on file   Social History Narrative  . No narrative on file     Review of Systems  All other systems reviewed and are negative.      Objective:   Physical Exam  Constitutional: She appears well-developed and well-nourished. No distress.  Neck: No JVD present.  Cardiovascular: Normal rate, regular rhythm and normal heart sounds.   No murmur heard. Pulmonary/Chest: Effort normal and breath sounds normal. No respiratory distress. She has no wheezes. She has no rales.  Abdominal: Soft. Bowel sounds are normal.  Musculoskeletal: She exhibits no edema.  Skin: She is not diaphoretic.  Vitals reviewed.         Assessment & Plan:  COPD, mild ([place])  Patient has stage II COPD as defined by her  pulmonary function test. At the present time she is no longer smoking and she uses albuterol as needed. I will dictate a letter explaining this to the court. Perhaps they can make allowances so that she can use a different device that does not require such sustained expiratory effort.

## 2016-11-24 ENCOUNTER — Other Ambulatory Visit: Payer: Self-pay | Admitting: Family Medicine

## 2016-11-30 ENCOUNTER — Encounter: Payer: Self-pay | Admitting: Pulmonary Disease

## 2016-11-30 ENCOUNTER — Ambulatory Visit (INDEPENDENT_AMBULATORY_CARE_PROVIDER_SITE_OTHER): Payer: Medicare Other | Admitting: Pulmonary Disease

## 2016-11-30 VITALS — BP 126/72 | HR 60 | Ht 62.0 in | Wt 137.2 lb

## 2016-11-30 DIAGNOSIS — R0602 Shortness of breath: Secondary | ICD-10-CM | POA: Diagnosis not present

## 2016-11-30 NOTE — Progress Notes (Signed)
[name]    [id number]    1961-08-11  Primary Care Physician:PICKARD,WARREN TOM, MD  Referring Physician: Susy Frizzle, MD [address] [place], [place] [address]  Chief complaint:  Consult for spirometry Needs DMV paperwork filled for breathalyzer.  HPI: 56 year old with history of COPD (CAT score 12), ex-smoker. She has history of long-standing dyspnea on exertion, cough, sputum production. She follows with her primary care for her COPD and is just on albuterol inhaler multiple inhalers in the past but had to stop due to side effects. She's has an Ignition interlock system due to DUI. She isn't able to blow into it to make it work and is here to get spirometry done for documentation.  Outpatient Encounter Prescriptions as of 11/30/2016  Medication Sig  . albuterol (PROVENTIL HFA;VENTOLIN HFA) 108 (90 Base) MCG/ACT inhaler Inhale 2 puffs into the lungs every 6 (six) hours as needed for wheezing or shortness of breath.  Marland Kitchen CANASA 1000 MG suppository PLACE 1 SUPPOSITORY (1,000 MG TOTAL) RECTALLY AT BEDTIME.  Marland Kitchen gabapentin (NEURONTIN) 300 MG capsule TAKE ONE CAPSULE BY MOUTH 3 TIMES A DAY  . vitamin B-12 (CYANOCOBALAMIN) 500 MCG tablet Take 500 mcg by mouth daily.  . [DISCONTINUED] guaiFENesin-codeine 100-10 MG/5ML syrup Take 5 mLs by mouth every 6 (six) hours as needed.  . [DISCONTINUED] oseltamivir (TAMIFLU) 75 MG capsule Take 1 capsule (75 mg total) by mouth 2 (two) times daily.  . [DISCONTINUED] predniSONE (DELTASONE) 20 MG tablet Take 2 tablets (40 mg total) by mouth daily with breakfast. For 5 days   No facility-administered encounter medications on file as of 11/30/2016.     Allergies as of 11/30/2016 - Review Complete 11/30/2016  Allergen Reaction Noted  . Aspirin Shortness Of Breath 08/16/2011  . Codeine Other (See Comments)     Past Medical History:  Diagnosis Date  . Anxiety    BILATERAL HANDS-- LEFT >RIGHT AND LEGS  . Arthritis   . Asthma     NO INHALER  . Bed bug bite   . Blood transfusion without reported diagnosis   . CA - skin cancer   . Chronic pain due to injury RIGHT HAND/WRIST IN 2007  . Complication of anesthesia    unable to urinate after surgery  . COPD (chronic obstructive pulmonary disease) (HCC)    Stage 1  . Crush injury of hand RIGHT IN 2007   X 6 SURG'S  LAST ONE BEING TOTAL WRIST FUSION W/ BONE GRAFT AND PLATE  . Cubital tunnel syndrome    Left elbow  . Depression   . ETOH abuse   . GERD (gastroesophageal reflux disease)    TAKES TUMS PRN  . Hemorrhoids   . MRSA (methicillin resistant Staphylococcus aureus) carrier   . Neuromuscular disorder (HCC)    some fibromyalgia  . Neuropathy ([place])   . PTSD (post-traumatic stress disorder) 2007 RIGHT HAND CRUSH INJURY W/ 2 FINGER AMPUTATION  . Stroke ([place]) X2 CVA'S  IN 1990   LOSS OF VISION RIGHT EYE  . Substance abuse    hx: narcotic use  . Weakness of right leg CAUSED BY RETRIEVAL THIGH MUSCLE FOR FLAP RIGHT HAND INJURY-- 2006   RIGHT LEG GIVES OUT OCCASIONALLY    Past Surgical History:  Procedure Laterality Date  . COLON SURGERY     15 years ago- fistula on colon removed  . COSMETIC SURGERY     right leg and abdomen  . DILATATION &  CURETTAGE/HYSTEROSCOPY WITH MYOSURE N/A 06/14/2016   Procedure: DILATATION & CURETTAGE/HYSTEROSCOPY WITH MYOSURE;  Surgeon: Anastasio Auerbach, MD;  Location: [facility];  Service: Gynecology;  Laterality: N/A;  . HAND CARPECTOMY  12-24-2005   RIGHT-- INCLUDING  REMOVAL  WRIST WIRES AND I &D (EXICIOSNAL)  FOR OSTEOMYELITIS  . HYSTEROSCOPY  12.6.2012   w/removal of polyp  . HYSTEROSCOPY W/D&C  08/18/2011   Procedure: DILATATION AND CURETTAGE (D&C) /HYSTEROSCOPY;  Surgeon: Anastasio Auerbach, MD;  Location: [facility];  Service: Gynecology;  Laterality: N/A;  . INCISION / DRAINAGE HAND / FINGER  X3  (2006 & 2007)   RIGHT CRUSH WOUND  . LESION REMOVAL  08/18/2011   Procedure: EXCISION VAGINAL LESION;  Surgeon:  Anastasio Auerbach, MD;  Location: [facility];  Service: Gynecology;  Laterality: N/A;  . ORIF CARPAL BONE FRACTURE  08-31-05   I & D OF RIGHT HAND CRUSH INJURY/ OPEN FX'S AND INDEX FINGER RAY RESECTION/ COMPLICATED CLOSURE  . ORIF RADIAL FRACTURE  09-04-05   DISTAL RADIAL PLATE AND PARTIAL THUMB AMPUTATION  . RIGHT TOTAL WRIST FUSION W/ BONE GRAFT AND PLATE  [id number]   AND FLAP USING RIGHT GOIN MUSCLE  . skin cancer removal  2007   throat area  . TUBAL LIGATION  15 YRS AGO    Family History  Problem Relation Age of Onset  . Lupus Mother     died at 51  . Breast cancer Maternal Aunt 35  . Breast cancer Maternal Grandmother 60  . Stomach cancer Paternal Grandfather   . Colon cancer Neg Hx   . Rectal cancer Neg Hx   . Esophageal cancer Neg Hx     Social History   Social History  . Marital status: Single    Spouse name: N/A  . Number of children: 6  . Years of education: N/A   Occupational History  . Housekeeper     Social History Main Topics  . Smoking status: Former Smoker    Packs/day: 1.50    Years: 28.00    Types: Cigarettes    Quit date: 11/29/2001  . Smokeless tobacco: Never Used  . Alcohol use 0.0 oz/week     Comment: 4 drinks a week  . Drug use: No  . Sexual activity: Yes    Partners: Male    Birth control/ protection: Post-menopausal     Comment: 1st intercourse 56 yo-More than 5 partners   Other Topics Concern  . Not on file   Social History Narrative  . No narrative on file    Review of systems: Review of Systems  Constitutional: Negative for fever and chills.  HENT: Negative.   Eyes: Negative for blurred vision.  Respiratory: as per HPI  Cardiovascular: Negative for chest pain and palpitations.  Gastrointestinal: Negative for vomiting, diarrhea, blood per rectum. Genitourinary: Negative for dysuria, urgency, frequency and hematuria.  Musculoskeletal: Negative for myalgias, back pain and joint pain.  Skin: Negative for  itching and rash.  Neurological: Negative for dizziness, tremors, focal weakness, seizures and loss of consciousness.  Endo/Heme/Allergies: Negative for environmental allergies.  Psychiatric/Behavioral: Negative for depression, suicidal ideas and hallucinations.  All other systems reviewed and are negative.  Physical Exam: Blood pressure 126/72, pulse 60, height 5' 2"  (1.575 m), weight 137 lb 3.2 oz (62.2 kg), SpO2 100 %. Gen:      No acute distress HEENT:  EOMI, sclera anicteric Neck:     No masses; no thyromegaly Lungs:  Clear to auscultation bilaterally; normal respiratory effort CV:         Regular rate and rhythm; no murmurs Abd:      + bowel sounds; soft, non-tender; no palpable masses, no distension Ext:    No edema; adequate peripheral perfusion Skin:      Warm and dry; no rash Neuro: alert and oriented x 3 Psych: normal mood and affect  Data Reviewed: PFTs 11/30/16 FVC 2.30 [73%) FEV1 1.19 [49%) F/F 52 Severe airway obstruction.  Assessment:  Severe COPD PFTs reviewed which showed severe airway obstruction. I have filled the DMV paperwork and given to the patient  I had recommended following up in the pulmonary office for further management of COPD but she has declined and prefers to follow up with her primary care. She'll call back as needed.  Plan/Recommendations: - Follow up as needed  Marshell Garfinkel MD [facility] Pager [phone] 11/30/2016, 4:09 PM  CC: Susy Frizzle, MD

## 2016-11-30 NOTE — Patient Instructions (Signed)
Follow up as needed

## 2016-12-02 ENCOUNTER — Encounter: Payer: Self-pay | Admitting: Family Medicine

## 2016-12-02 ENCOUNTER — Encounter: Payer: Medicare Other | Admitting: Family Medicine

## 2016-12-02 NOTE — Addendum Note (Signed)
Addended by: Jenna Luo T on: 12/02/2016 01:00 PM   Modules accepted: Orders

## 2016-12-05 NOTE — Progress Notes (Signed)
This encounter was created in error - please disregard.

## 2016-12-08 DIAGNOSIS — J449 Chronic obstructive pulmonary disease, unspecified: Secondary | ICD-10-CM | POA: Diagnosis not present

## 2016-12-08 DIAGNOSIS — J410 Simple chronic bronchitis: Secondary | ICD-10-CM | POA: Insufficient documentation

## 2017-01-10 ENCOUNTER — Encounter: Payer: Self-pay | Admitting: Family Medicine

## 2017-01-10 ENCOUNTER — Ambulatory Visit (INDEPENDENT_AMBULATORY_CARE_PROVIDER_SITE_OTHER): Payer: Medicare Other | Admitting: Family Medicine

## 2017-01-10 VITALS — BP 140/90 | HR 18 | Temp 97.8°F | Resp 20 | Ht 62.0 in | Wt 134.0 lb

## 2017-01-10 DIAGNOSIS — M5431 Sciatica, right side: Secondary | ICD-10-CM

## 2017-01-10 MED ORDER — HYDROCODONE-ACETAMINOPHEN 5-325 MG PO TABS: 1.0000 | ORAL_TABLET | Freq: Four times a day (QID) | ORAL | 0 refills | Status: DC | PRN

## 2017-01-10 MED ORDER — PREDNISONE 20 MG PO TABS: ORAL_TABLET | ORAL | 0 refills | Status: DC

## 2017-01-10 NOTE — Progress Notes (Signed)
Subjective:    Patient ID: [name], female    DOB: 1961/01/29, 56 y.o.   MRN: [id number]  HPI Over the weekend, the patient injured her back. She is trying to throw a heavy bag of trash into a dumpster. Afterwards she developed severe pain around the level of L5 radiating around her back in a bandlike fashion. She also reports numbness and tingling radiating down her right leg into her right foot as well as pain radiating into her posterior right hip and her posterior left hip. She denies any bowel or bladder incontinence. She denies any saddle anesthesia. There is no evidence of weakness in her legs today on exam. She does have a positive straight leg raise on the right side. Past Medical History:  Diagnosis Date  . Anxiety    BILATERAL HANDS-- LEFT >RIGHT AND LEGS  . Arthritis   . Asthma    NO INHALER  . Bed bug bite   . Blood transfusion without reported diagnosis   . CA - skin cancer   . Chronic pain due to injury RIGHT HAND/WRIST IN 2007  . Complication of anesthesia    unable to urinate after surgery  . COPD (chronic obstructive pulmonary disease) (HCC)    Stage 1  . Crush injury of hand RIGHT IN 2007   X 6 SURG'S  LAST ONE BEING TOTAL WRIST FUSION W/ BONE GRAFT AND PLATE  . Cubital tunnel syndrome    Left elbow  . Depression   . ETOH abuse   . GERD (gastroesophageal reflux disease)    TAKES TUMS PRN  . Hemorrhoids   . MRSA (methicillin resistant Staphylococcus aureus) carrier   . Neuromuscular disorder (HCC)    some fibromyalgia  . Neuropathy   . PTSD (post-traumatic stress disorder) 2007 RIGHT HAND CRUSH INJURY W/ 2 FINGER AMPUTATION  . Stroke ([place]) X2 CVA'S  IN 1990   LOSS OF VISION RIGHT EYE  . Substance abuse    hx: narcotic use  . Weakness of right leg CAUSED BY RETRIEVAL THIGH MUSCLE FOR FLAP RIGHT HAND INJURY-- 2006   RIGHT LEG GIVES OUT OCCASIONALLY   Past Surgical History:  Procedure Laterality Date  . COLON SURGERY     15 years ago- fistula on  colon removed  . COSMETIC SURGERY     right leg and abdomen  . DILATATION & CURETTAGE/HYSTEROSCOPY WITH MYOSURE N/A 06/14/2016   Procedure: [place];  Surgeon: Anastasio Auerbach, MD;  Location: [facility];  Service: Gynecology;  Laterality: N/A;  . HAND CARPECTOMY  12-24-2005   RIGHT-- INCLUDING  REMOVAL  WRIST WIRES AND I &D (EXICIOSNAL)  FOR OSTEOMYELITIS  . HYSTEROSCOPY  12.6.2012   w/removal of polyp  . HYSTEROSCOPY W/D&C  08/18/2011   Procedure: DILATATION AND CURETTAGE (D&C) /HYSTEROSCOPY;  Surgeon: Anastasio Auerbach, MD;  Location: [facility];  Service: Gynecology;  Laterality: N/A;  . INCISION / DRAINAGE HAND / FINGER  X3  (2006 & 2007)   RIGHT CRUSH WOUND  . LESION REMOVAL  08/18/2011   Procedure: EXCISION VAGINAL LESION;  Surgeon: Anastasio Auerbach, MD;  Location: [facility];  Service: Gynecology;  Laterality: N/A;  . ORIF CARPAL BONE FRACTURE  08-31-05   I & D OF RIGHT HAND CRUSH INJURY/ OPEN FX'S AND INDEX FINGER RAY RESECTION/ COMPLICATED CLOSURE  . ORIF RADIAL FRACTURE  09-04-05   DISTAL RADIAL PLATE AND PARTIAL THUMB AMPUTATION  . RIGHT TOTAL WRIST FUSION W/ BONE  GRAFT AND PLATE  [id number]   AND FLAP USING RIGHT GOIN MUSCLE  . skin cancer removal  2007   throat area  . TUBAL LIGATION  15 YRS AGO   Current Outpatient Prescriptions on File Prior to Visit  Medication Sig Dispense Refill  . albuterol (PROVENTIL HFA;VENTOLIN HFA) 108 (90 Base) MCG/ACT inhaler Inhale 2 puffs into the lungs every 6 (six) hours as needed for wheezing or shortness of breath. 1 Inhaler 1  . CANASA 1000 MG suppository PLACE 1 SUPPOSITORY (1,000 MG TOTAL) RECTALLY AT BEDTIME. 30 suppository 6  . gabapentin (NEURONTIN) 300 MG capsule TAKE ONE CAPSULE BY MOUTH 3 TIMES A DAY 270 capsule 1  . vitamin B-12 (CYANOCOBALAMIN) 500 MCG tablet Take 500 mcg by mouth daily.     No current facility-administered medications on file prior to  visit.    Allergies  Allergen Reactions  . Aspirin Shortness Of Breath    ASTHMATIC ATTACK  . Codeine Other (See Comments)    ABNORMAL BEHAVIOR  Increased in bad moods    Social History   Social History  . Marital status: Single    Spouse name: N/A  . Number of children: 6  . Years of education: N/A   Occupational History  . Housekeeper     Social History Main Topics  . Smoking status: Former Smoker    Packs/day: 1.50    Years: 28.00    Types: Cigarettes    Quit date: 11/29/2001  . Smokeless tobacco: Never Used  . Alcohol use 0.0 oz/week     Comment: 4 drinks a week  . Drug use: No  . Sexual activity: Yes    Partners: Male    Birth control/ protection: Post-menopausal     Comment: 1st intercourse 56 yo-More than 5 partners   Other Topics Concern  . Not on file   Social History Narrative  . No narrative on file      Review of Systems  All other systems reviewed and are negative.      Objective:   Physical Exam  Constitutional: She appears well-developed and well-nourished.  Cardiovascular: Normal rate, regular rhythm and normal heart sounds.   Pulmonary/Chest: Effort normal and breath sounds normal. No respiratory distress. She has no wheezes. She has no rales.  Musculoskeletal:       Lumbar back: She exhibits decreased range of motion, tenderness and pain. She exhibits no bony tenderness, no swelling, no edema, no deformity and no spasm.       Back:  Vitals reviewed.         Assessment & Plan:  Sciatica of right side - Plan: predniSONE (DELTASONE) 20 MG tablet, HYDROcodone-acetaminophen (NORCO) 5-325 MG tablet  I suspect the patient may have herniated disc in her lower back and has resultant right-sided sciatica. Begin a prednisone taper pack for presumed herniated disc. Use Norco 5/325 one by mouth every 6 hours when necessary pain. Patient was given 20 tablets. Recheck in one week if no better or sooner if worse for possible imaging.

## 2017-01-19 ENCOUNTER — Other Ambulatory Visit: Payer: Self-pay | Admitting: Family Medicine

## 2017-01-19 MED ORDER — UMECLIDINIUM BROMIDE 62.5 MCG/INH IN AEPB: 1.0000 | INHALATION_SPRAY | Freq: Every day | RESPIRATORY_TRACT | 5 refills | Status: DC

## 2017-01-20 ENCOUNTER — Encounter: Payer: Self-pay | Admitting: Family Medicine

## 2017-01-20 ENCOUNTER — Ambulatory Visit (INDEPENDENT_AMBULATORY_CARE_PROVIDER_SITE_OTHER): Payer: Medicare Other | Admitting: Family Medicine

## 2017-01-20 VITALS — BP 144/90 | HR 76 | Temp 98.3°F | Resp 16 | Ht 62.0 in | Wt 133.0 lb

## 2017-01-20 DIAGNOSIS — M545 Low back pain: Secondary | ICD-10-CM | POA: Diagnosis not present

## 2017-01-20 DIAGNOSIS — M5431 Sciatica, right side: Secondary | ICD-10-CM | POA: Diagnosis not present

## 2017-01-20 MED ORDER — PREDNISONE 20 MG PO TABS: ORAL_TABLET | ORAL | 0 refills | Status: DC

## 2017-01-20 NOTE — Progress Notes (Signed)
Subjective:    Patient ID: [name], female    DOB: [birth year], 56 y.o.   MRN: [id number]  HPI  01/10/17 Over the weekend, the patient injured her back. She is trying to throw a heavy bag of trash into a dumpster. Afterwards she developed severe pain around the level of L5 radiating around her back in a bandlike fashion. She also reports numbness and tingling radiating down her right leg into her right foot as well as pain radiating into her posterior right hip and her posterior left hip. She denies any bowel or bladder incontinence. She denies any saddle anesthesia. There is no evidence of weakness in her legs today on exam. She does have a positive straight leg raise on the right side.  At that time, my plan was: I suspect the patient may have herniated disc in her lower back and has resultant right-sided sciatica. Begin a prednisone taper pack for presumed herniated disc. Use Norco 5/325 one by mouth every 6 hours when necessary pain. Patient was given 20 tablets. Recheck in one week if no better or sooner if worse for possible imaging.  01/20/17 Patient states that the prednisone helped initially. Shortly after she stopped using the prednisone however the pain returned. Now the pain is constant but it centered in the posterior aspect of the left iliac crest. It is a constant deep boring ache. She also continues to have some numbness and tingling in her right leg. However she now reports no exacerbation with range of motion. I am unable to reproduce the pain by palpating in the area demarcated on the diagram. She denies any fevers or chills or dysuria or hematuria. She denies any blood in her stool. She denies any abdominal pain. She denies any vaginal bleeding or vaginal discharge. She denies any night sweats Past Medical History:  Diagnosis Date  . Anxiety    BILATERAL HANDS-- LEFT >RIGHT AND LEGS  . Arthritis   . Asthma    NO INHALER  . Bed bug bite   . Blood transfusion without  reported diagnosis   . CA - skin cancer   . Chronic pain due to injury RIGHT HAND/WRIST IN 2007  . Complication of anesthesia    unable to urinate after surgery  . COPD (chronic obstructive pulmonary disease) (HCC)    Stage 1  . Crush injury of hand RIGHT IN 2007   X 6 SURG'S  LAST ONE BEING TOTAL WRIST FUSION W/ BONE GRAFT AND PLATE  . Cubital tunnel syndrome    Left elbow  . Depression   . ETOH abuse   . GERD (gastroesophageal reflux disease)    TAKES TUMS PRN  . Hemorrhoids   . MRSA (methicillin resistant Staphylococcus aureus) carrier   . Neuromuscular disorder (HCC)    some fibromyalgia  . Neuropathy   . PTSD (post-traumatic stress disorder) 2007 RIGHT HAND CRUSH INJURY W/ 2 FINGER AMPUTATION  . Stroke ([place]) X2 CVA'S  IN 1990   LOSS OF VISION RIGHT EYE  . Substance abuse    hx: narcotic use  . Weakness of right leg CAUSED BY RETRIEVAL THIGH MUSCLE FOR FLAP RIGHT HAND INJURY-- 2006   RIGHT LEG GIVES OUT OCCASIONALLY   Past Surgical History:  Procedure Laterality Date  . COLON SURGERY     15 years ago- fistula on colon removed  . COSMETIC SURGERY     right leg and abdomen  . DILATATION & CURETTAGE/HYSTEROSCOPY WITH MYOSURE N/A 06/14/2016   Procedure: DILATATION &  CURETTAGE/HYSTEROSCOPY WITH MYOSURE;  Surgeon: Anastasio Auerbach, MD;  Location: [facility];  Service: Gynecology;  Laterality: N/A;  . HAND CARPECTOMY  12-24-2005   RIGHT-- INCLUDING  REMOVAL  WRIST WIRES AND I &D (EXICIOSNAL)  FOR OSTEOMYELITIS  . HYSTEROSCOPY  12.6.2012   w/removal of polyp  . HYSTEROSCOPY W/D&C  08/18/2011   Procedure: DILATATION AND CURETTAGE (D&C) /HYSTEROSCOPY;  Surgeon: Anastasio Auerbach, MD;  Location: [facility];  Service: Gynecology;  Laterality: N/A;  . INCISION / DRAINAGE HAND / FINGER  X3  (2006 & 2007)   RIGHT CRUSH WOUND  . LESION REMOVAL  08/18/2011   Procedure: EXCISION VAGINAL LESION;  Surgeon: Anastasio Auerbach, MD;  Location: [facility];   Service: Gynecology;  Laterality: N/A;  . ORIF CARPAL BONE FRACTURE  08-31-05   I & D OF RIGHT HAND CRUSH INJURY/ OPEN FX'S AND INDEX FINGER RAY RESECTION/ COMPLICATED CLOSURE  . ORIF RADIAL FRACTURE  09-04-05   DISTAL RADIAL PLATE AND PARTIAL THUMB AMPUTATION  . RIGHT TOTAL WRIST FUSION W/ BONE GRAFT AND PLATE  [id number]   AND FLAP USING RIGHT GOIN MUSCLE  . skin cancer removal  2007   throat area  . TUBAL LIGATION  15 YRS AGO   Current Outpatient Prescriptions on File Prior to Visit  Medication Sig Dispense Refill  . Aclidinium Bromide (TUDORZA PRESSAIR) 400 MCG/ACT AEPB Inhale into the lungs daily.    Marland Kitchen albuterol (PROVENTIL HFA;VENTOLIN HFA) 108 (90 Base) MCG/ACT inhaler Inhale 2 puffs into the lungs every 6 (six) hours as needed for wheezing or shortness of breath. 1 Inhaler 1  . CANASA 1000 MG suppository PLACE 1 SUPPOSITORY (1,000 MG TOTAL) RECTALLY AT BEDTIME. 30 suppository 6  . HYDROcodone-acetaminophen (NORCO) 5-325 MG tablet Take 1 tablet by mouth every 6 (six) hours as needed for moderate pain. 20 tablet 0  . umeclidinium bromide (INCRUSE ELLIPTA) 62.5 MCG/INH AEPB Inhale 1 puff into the lungs daily. 1 each 5  . vitamin B-12 (CYANOCOBALAMIN) 500 MCG tablet Take 500 mcg by mouth daily.    Marland Kitchen gabapentin (NEURONTIN) 300 MG capsule TAKE ONE CAPSULE BY MOUTH 3 TIMES A DAY (Patient not taking: Reported on 01/20/2017) 270 capsule 1   No current facility-administered medications on file prior to visit.    Allergies  Allergen Reactions  . Aspirin Shortness Of Breath    ASTHMATIC ATTACK  . Codeine Other (See Comments)    ABNORMAL BEHAVIOR  Increased in bad moods    Social History   Social History  . Marital status: Single    Spouse name: N/A  . Number of children: 6  . Years of education: N/A   Occupational History  . Housekeeper     Social History Main Topics  . Smoking status: Former Smoker    Packs/day: 1.50    Years: 28.00    Types: Cigarettes    Quit date:  11/29/2001  . Smokeless tobacco: Never Used  . Alcohol use 0.0 oz/week     Comment: 4 drinks a week  . Drug use: No  . Sexual activity: Yes    Partners: Male    Birth control/ protection: Post-menopausal     Comment: 1st intercourse 56 yo-More than 5 partners   Other Topics Concern  . Not on file   Social History Narrative  . No narrative on file      Review of Systems  Musculoskeletal: Positive for back pain.  All other systems reviewed and are negative.  Objective:   Physical Exam  Constitutional: She appears well-developed and well-nourished.  Cardiovascular: Normal rate, regular rhythm and normal heart sounds.   Pulmonary/Chest: Effort normal and breath sounds normal. No respiratory distress. She has no wheezes. She has no rales.  Musculoskeletal:       Lumbar back: She exhibits pain. She exhibits normal range of motion, no tenderness, no bony tenderness, no swelling, no edema, no deformity and no spasm.       Back:  Vitals reviewed.         Assessment & Plan:  Sciatica of right side - Plan: DG Lumbar Spine Complete, predniSONE (DELTASONE) 20 MG tablet, CBC with Differential/Platelet, COMPLETE METABOLIC PANEL WITH GFR  Acute left-sided low back pain, with sciatica presence unspecified - Plan: DG Pelvis 1-2 Views  I still suspect that the pain is referred given to the fact that started last time and it was associated with right-sided sciatica and now is radiating into the left superior gluteus, I believe that she likely has a herniated disc in her lower back with lumbar radiculopathy. Obtain x-ray of the lumbar spine as well as an x-ray of the pelvis to rule out skeletal pathology. Try 1 additional prednisone taper pack. Also check CBC to evaluate for leukocytosis. If symptoms worsen, we may need to get an MRI of the lumbar spine

## 2017-01-21 LAB — CBC WITH DIFFERENTIAL/PLATELET
BASOS ABS: 109 {cells}/uL (ref 0–200)
BASOS PCT: 1 %
EOS ABS: 327 {cells}/uL (ref 15–500)
Eosinophils Relative: 3 %
HEMATOCRIT: 41.2 % (ref 35.0–45.0)
HEMOGLOBIN: 13.4 g/dL (ref 12.0–15.0)
LYMPHS ABS: 3379 {cells}/uL (ref 850–3900)
Lymphocytes Relative: 31 %
MCH: 31.2 pg (ref 27.0–33.0)
MCHC: 32.5 g/dL (ref 32.0–36.0)
MCV: 96 fL (ref 80.0–100.0)
MONO ABS: 981 {cells}/uL — AB (ref 200–950)
MONOS PCT: 9 %
MPV: 9.5 fL (ref 7.5–12.5)
NEUTROS ABS: 6104 {cells}/uL (ref 1500–7800)
Neutrophils Relative %: 56 %
Platelets: 332 10*3/uL (ref 140–400)
RBC: 4.29 MIL/uL (ref 3.80–5.10)
RDW: 13.2 % (ref 11.0–15.0)
WBC: 10.9 10*3/uL — ABNORMAL HIGH (ref 3.8–10.8)

## 2017-01-21 LAB — COMPLETE METABOLIC PANEL WITH GFR
ALBUMIN: 4.2 g/dL (ref 3.6–5.1)
ALK PHOS: 46 U/L (ref 33–130)
ALT: 20 U/L (ref 6–29)
AST: 21 U/L (ref 10–35)
BILIRUBIN TOTAL: 0.3 mg/dL (ref 0.2–1.2)
BUN: 15 mg/dL (ref 7–25)
CALCIUM: 9.8 mg/dL (ref 8.6–10.4)
CO2: 28 mmol/L (ref 20–31)
CREATININE: 0.71 mg/dL (ref 0.50–1.05)
Chloride: 105 mmol/L (ref 98–110)
Glucose, Bld: 85 mg/dL (ref 70–99)
Potassium: 3.8 mmol/L (ref 3.5–5.3)
Sodium: 140 mmol/L (ref 135–146)
Total Protein: 6.6 g/dL (ref 6.1–8.1)

## 2017-01-23 ENCOUNTER — Ambulatory Visit (HOSPITAL_COMMUNITY)
Admission: RE | Admit: 2017-01-23 | Discharge: 2017-01-23 | Disposition: A | Discharge status: Home / Self Care | Payer: Medicare Other | Source: Ambulatory Visit | Attending: Family Medicine | Admitting: Family Medicine

## 2017-01-23 DIAGNOSIS — M5136 Other intervertebral disc degeneration, lumbar region: Secondary | ICD-10-CM | POA: Diagnosis not present

## 2017-01-23 DIAGNOSIS — M545 Low back pain: Secondary | ICD-10-CM

## 2017-01-23 DIAGNOSIS — M5431 Sciatica, right side: Secondary | ICD-10-CM

## 2017-01-23 DIAGNOSIS — I700 Atherosclerosis of aorta: Secondary | ICD-10-CM | POA: Insufficient documentation

## 2017-01-23 DIAGNOSIS — S3992XA Unspecified injury of lower back, initial encounter: Secondary | ICD-10-CM | POA: Diagnosis not present

## 2017-01-24 ENCOUNTER — Other Ambulatory Visit: Payer: Self-pay | Admitting: Family Medicine

## 2017-01-24 DIAGNOSIS — M545 Low back pain: Principal | ICD-10-CM

## 2017-01-24 DIAGNOSIS — G8929 Other chronic pain: Secondary | ICD-10-CM

## 2017-02-07 ENCOUNTER — Encounter (INDEPENDENT_AMBULATORY_CARE_PROVIDER_SITE_OTHER): Payer: Self-pay | Admitting: Orthopaedic Surgery

## 2017-02-07 ENCOUNTER — Ambulatory Visit (INDEPENDENT_AMBULATORY_CARE_PROVIDER_SITE_OTHER): Payer: Medicare Other | Admitting: Orthopaedic Surgery

## 2017-02-07 DIAGNOSIS — G8929 Other chronic pain: Secondary | ICD-10-CM

## 2017-02-07 DIAGNOSIS — M545 Low back pain, unspecified: Secondary | ICD-10-CM

## 2017-02-07 NOTE — Addendum Note (Signed)
Addended by: Precious Bard on: 02/07/2017 04:45 PM   Modules accepted: Orders

## 2017-02-07 NOTE — Progress Notes (Signed)
Office Visit Note   Patient: [name]           Date of Birth: Oct 07, 1960           MRN: [id number] Visit Date: 02/07/2017              Requested by: Susy Frizzle, MD [address] [place], [place] [address] PCP: Susy Frizzle, MD   Assessment & Plan: Visit Diagnoses:  1. Chronic midline low back pain without sciatica     Plan: X-ray show lumbar facet disease. She's also endorsing neurogenic claudication. MRI of the lumbar spine to assess for spinal stenosis. Follow-up after the MRI.  Follow-Up Instructions: Return in about 2 weeks (around 02/21/2017).   Orders:  No orders of the defined types were placed in this encounter.  No orders of the defined types were placed in this encounter.     Procedures: No procedures performed   Clinical Data: No additional findings.   Subjective: No chief complaint on file.   Patient is a 56 year old female with low back pain for years with recent worsening over the last 4-6 weeks. She's been seeing her primary care doctor for this. She endorses pain and weakness in both her legs with increased activity. She is endorsing neurogenic claudication. She denies any constitutional symptoms or bowel or bladder dysfunction. She does drink alcohol quite significantly    Review of Systems  Constitutional: Negative.   HENT: Negative.   Eyes: Negative.   Respiratory: Negative.   Cardiovascular: Negative.   Endocrine: Negative.   Musculoskeletal: Negative.   Neurological: Negative.   Hematological: Negative.   Psychiatric/Behavioral: Negative.   All other systems reviewed and are negative.    Objective: Vital Signs: There were no vitals taken for this visit.  Physical Exam  Constitutional: She is oriented to person, place, and time. She appears well-developed and well-nourished.  Pulmonary/Chest: Effort normal.  Neurological: She is alert and oriented to person, place, and time.  Skin: Skin is warm.  Capillary refill takes less than 2 seconds.  Psychiatric: She has a normal mood and affect. Her behavior is normal. Judgment and thought content normal.  Nursing note and vitals reviewed.   Ortho Exam Bilateral lower extremity exam shows hyporeflexic right patellar tendon reflex. She has good range of motion of the knees and hips and ankles. There is no pain with palpation. Normal motor and sensory function. Specialty Comments:  No specialty comments available.  Imaging: No results found.   PMFS History: Patient Active Problem List   Diagnosis Date Noted  . Acute sinusitis 12/13/2013  . Acute bronchitis 12/13/2013  . Laceration of finger 10/06/2013  . PTSD (post-traumatic stress disorder)    Past Medical History:  Diagnosis Date  . Anxiety    BILATERAL HANDS-- LEFT >RIGHT AND LEGS  . Arthritis   . Asthma    NO INHALER  . Bed bug bite   . Blood transfusion without reported diagnosis   . CA - skin cancer   . Chronic pain due to injury RIGHT HAND/WRIST IN 2007  . Complication of anesthesia    unable to urinate after surgery  . COPD (chronic obstructive pulmonary disease) (HCC)    Stage 1  . Crush injury of hand RIGHT IN 2007   X 6 SURG'S  LAST ONE BEING TOTAL WRIST FUSION W/ BONE GRAFT AND PLATE  . Cubital tunnel syndrome    Left elbow  . Depression   . ETOH abuse   .  GERD (gastroesophageal reflux disease)    TAKES TUMS PRN  . Hemorrhoids   . MRSA (methicillin resistant Staphylococcus aureus) carrier   . Neuromuscular disorder (HCC)    some fibromyalgia  . Neuropathy   . PTSD (post-traumatic stress disorder) 2007 RIGHT HAND CRUSH INJURY W/ 2 FINGER AMPUTATION  . Stroke ([facility]) X2 CVA'S  IN 1990   LOSS OF VISION RIGHT EYE  . Substance abuse    hx: narcotic use  . Weakness of right leg CAUSED BY RETRIEVAL THIGH MUSCLE FOR FLAP RIGHT HAND INJURY-- 2006   RIGHT LEG GIVES OUT OCCASIONALLY    Family History  Problem Relation Age of Onset  . Lupus Mother        died  at 70  . Breast cancer Maternal Aunt 35  . Breast cancer Maternal Grandmother 60  . Stomach cancer Paternal Grandfather   . Colon cancer Neg Hx   . Rectal cancer Neg Hx   . Esophageal cancer Neg Hx     Past Surgical History:  Procedure Laterality Date  . COLON SURGERY     15 years ago- fistula on colon removed  . COSMETIC SURGERY     right leg and abdomen  . DILATATION & CURETTAGE/HYSTEROSCOPY WITH MYOSURE N/A 06/14/2016   Procedure: [place];  Surgeon: Anastasio Auerbach, MD;  Location: [facility];  Service: Gynecology;  Laterality: N/A;  . HAND CARPECTOMY  12-24-2005   RIGHT-- INCLUDING  REMOVAL  WRIST WIRES AND I &D (EXICIOSNAL)  FOR OSTEOMYELITIS  . HYSTEROSCOPY  12.6.2012   w/removal of polyp  . HYSTEROSCOPY W/D&C  08/18/2011   Procedure: DILATATION AND CURETTAGE (D&C) /HYSTEROSCOPY;  Surgeon: Anastasio Auerbach, MD;  Location: [facility];  Service: Gynecology;  Laterality: N/A;  . INCISION / DRAINAGE HAND / FINGER  X3  (2006 & 2007)   RIGHT CRUSH WOUND  . LESION REMOVAL  08/18/2011   Procedure: EXCISION VAGINAL LESION;  Surgeon: Anastasio Auerbach, MD;  Location: [facility];  Service: Gynecology;  Laterality: N/A;  . ORIF CARPAL BONE FRACTURE  08-31-05   I & D OF RIGHT HAND CRUSH INJURY/ OPEN FX'S AND INDEX FINGER RAY RESECTION/ COMPLICATED CLOSURE  . ORIF RADIAL FRACTURE  09-04-05   DISTAL RADIAL PLATE AND PARTIAL THUMB AMPUTATION  . RIGHT TOTAL WRIST FUSION W/ BONE GRAFT AND PLATE  [id number]   AND FLAP USING RIGHT GOIN MUSCLE  . skin cancer removal  2007   throat area  . TUBAL LIGATION  15 YRS AGO   Social History   Occupational History  . Housekeeper     Social History Main Topics  . Smoking status: Former Smoker    Packs/day: 1.50    Years: 28.00    Types: Cigarettes    Quit date: 11/29/2001  . Smokeless tobacco: Never Used  . Alcohol use 0.0 oz/week     Comment: 4 drinks a week  . Drug use:  No  . Sexual activity: Yes    Partners: Male    Birth control/ protection: Post-menopausal     Comment: 1st intercourse 56 yo-More than 5 partners

## 2017-02-21 ENCOUNTER — Ambulatory Visit (INDEPENDENT_AMBULATORY_CARE_PROVIDER_SITE_OTHER): Payer: Medicare Other | Admitting: Orthopaedic Surgery

## 2017-02-28 ENCOUNTER — Ambulatory Visit
Admission: RE | Admit: 2017-02-28 | Discharge: 2017-02-28 | Disposition: A | Discharge status: Home / Self Care | Payer: Medicare Other | Source: Ambulatory Visit | Attending: Orthopaedic Surgery | Admitting: Orthopaedic Surgery

## 2017-02-28 DIAGNOSIS — M545 Low back pain, unspecified: Secondary | ICD-10-CM

## 2017-02-28 DIAGNOSIS — G8929 Other chronic pain: Secondary | ICD-10-CM

## 2017-02-28 DIAGNOSIS — M48061 Spinal stenosis, lumbar region without neurogenic claudication: Secondary | ICD-10-CM | POA: Diagnosis not present

## 2017-03-13 ENCOUNTER — Encounter (INDEPENDENT_AMBULATORY_CARE_PROVIDER_SITE_OTHER): Payer: Self-pay | Admitting: Orthopaedic Surgery

## 2017-03-13 ENCOUNTER — Ambulatory Visit (INDEPENDENT_AMBULATORY_CARE_PROVIDER_SITE_OTHER): Payer: Medicare Other | Admitting: Orthopaedic Surgery

## 2017-03-13 DIAGNOSIS — G8929 Other chronic pain: Secondary | ICD-10-CM

## 2017-03-13 DIAGNOSIS — M545 Low back pain: Secondary | ICD-10-CM

## 2017-03-13 NOTE — Progress Notes (Signed)
Office Visit Note   Patient: [name]           Date of Birth: 1961/01/13           MRN: [id number] Visit Date: 03/13/2017              Requested by: Susy Frizzle, MD [address] [place], [place] [address] PCP: Susy Frizzle, MD   Assessment & Plan: Visit Diagnoses:  1. Chronic midline low back pain without sciatica     Plan: MRI shows mild spinal stenosis at several levels in the lumbar spine. She does have facet disease also. No critical stenosis. Patient is doing well clinically. Follow-up with me as needed.  Follow-Up Instructions: Return if symptoms worsen or fail to improve.   Orders:  No orders of the defined types were placed in this encounter.  No orders of the defined types were placed in this encounter.     Procedures: No procedures performed   Clinical Data: No additional findings.   Subjective: Chief Complaint  Patient presents with  . Spine - Follow-up, Pain    [name] comes back today review her MRI of her lumbar spine. She states that she is much better. She is almost 90% feeling better. She heard job has changed so it's less physically demanding now. She does not feel she needs any surgical interventions at this point. She is mainly here to review her MRI.    Review of Systems   Objective: Vital Signs: There were no vitals taken for this visit.  Physical Exam  Ortho Exam Exam is significantly improved and benign. Specialty Comments:  No specialty comments available.  Imaging: No results found.   PMFS History: Patient Active Problem List   Diagnosis Date Noted  . Acute sinusitis 12/13/2013  . Acute bronchitis 12/13/2013  . Laceration of finger 10/06/2013  . PTSD (post-traumatic stress disorder)    Past Medical History:  Diagnosis Date  . Anxiety    BILATERAL HANDS-- LEFT >RIGHT AND LEGS  . Arthritis   . Asthma    NO INHALER  . Bed bug bite   . Blood transfusion without reported diagnosis   . CA  - skin cancer   . Chronic pain due to injury RIGHT HAND/WRIST IN 2007  . Complication of anesthesia    unable to urinate after surgery  . COPD (chronic obstructive pulmonary disease) (HCC)    Stage 1  . Crush injury of hand RIGHT IN 2007   X 6 SURG'S  LAST ONE BEING TOTAL WRIST FUSION W/ BONE GRAFT AND PLATE  . Cubital tunnel syndrome    Left elbow  . Depression   . ETOH abuse   . GERD (gastroesophageal reflux disease)    TAKES TUMS PRN  . Hemorrhoids   . MRSA (methicillin resistant Staphylococcus aureus) carrier   . Neuromuscular disorder (HCC)    some fibromyalgia  . Neuropathy   . PTSD (post-traumatic stress disorder) 2007 RIGHT HAND CRUSH INJURY W/ 2 FINGER AMPUTATION  . Stroke ([facility]) X2 CVA'S  IN 1990   LOSS OF VISION RIGHT EYE  . Substance abuse    hx: narcotic use  . Weakness of right leg CAUSED BY RETRIEVAL THIGH MUSCLE FOR FLAP RIGHT HAND INJURY-- 2006   RIGHT LEG GIVES OUT OCCASIONALLY    Family History  Problem Relation Age of Onset  . Lupus Mother        died at 73  . Breast cancer Maternal Aunt  2  . Breast cancer Maternal Grandmother 60  . Stomach cancer Paternal Grandfather   . Colon cancer Neg Hx   . Rectal cancer Neg Hx   . Esophageal cancer Neg Hx     Past Surgical History:  Procedure Laterality Date  . COLON SURGERY     15 years ago- fistula on colon removed  . COSMETIC SURGERY     right leg and abdomen  . DILATATION & CURETTAGE/HYSTEROSCOPY WITH MYOSURE N/A 06/14/2016   Procedure: [place];  Surgeon: Anastasio Auerbach, MD;  Location: [facility];  Service: Gynecology;  Laterality: N/A;  . HAND CARPECTOMY  12-24-2005   RIGHT-- INCLUDING  REMOVAL  WRIST WIRES AND I &D (EXICIOSNAL)  FOR OSTEOMYELITIS  . HYSTEROSCOPY  12.6.2012   w/removal of polyp  . HYSTEROSCOPY W/D&C  08/18/2011   Procedure: DILATATION AND CURETTAGE (D&C) /HYSTEROSCOPY;  Surgeon: Anastasio Auerbach, MD;  Location: [facility];   Service: Gynecology;  Laterality: N/A;  . INCISION / DRAINAGE HAND / FINGER  X3  (2006 & 2007)   RIGHT CRUSH WOUND  . LESION REMOVAL  08/18/2011   Procedure: EXCISION VAGINAL LESION;  Surgeon: Anastasio Auerbach, MD;  Location: [facility];  Service: Gynecology;  Laterality: N/A;  . ORIF CARPAL BONE FRACTURE  08-31-05   I & D OF RIGHT HAND CRUSH INJURY/ OPEN FX'S AND INDEX FINGER RAY RESECTION/ COMPLICATED CLOSURE  . ORIF RADIAL FRACTURE  09-04-05   DISTAL RADIAL PLATE AND PARTIAL THUMB AMPUTATION  . RIGHT TOTAL WRIST FUSION W/ BONE GRAFT AND PLATE  [id number]   AND FLAP USING RIGHT GOIN MUSCLE  . skin cancer removal  2007   throat area  . TUBAL LIGATION  15 YRS AGO   Social History   Occupational History  . Housekeeper     Social History Main Topics  . Smoking status: Former Smoker    Packs/day: 1.50    Years: 28.00    Types: Cigarettes    Quit date: 11/29/2001  . Smokeless tobacco: Never Used  . Alcohol use 0.0 oz/week     Comment: 4 drinks a week  . Drug use: No  . Sexual activity: Yes    Partners: Male    Birth control/ protection: Post-menopausal     Comment: 1st intercourse 56 yo-More than 5 partners

## 2017-03-14 ENCOUNTER — Encounter: Payer: Self-pay | Admitting: Family Medicine

## 2017-03-14 ENCOUNTER — Ambulatory Visit (INDEPENDENT_AMBULATORY_CARE_PROVIDER_SITE_OTHER): Payer: Medicare Other | Admitting: Family Medicine

## 2017-03-14 VITALS — BP 140/88 | HR 62 | Temp 97.8°F | Resp 14 | Ht 62.0 in | Wt 136.0 lb

## 2017-03-14 DIAGNOSIS — L729 Follicular cyst of the skin and subcutaneous tissue, unspecified: Secondary | ICD-10-CM

## 2017-03-14 DIAGNOSIS — L03314 Cellulitis of groin: Secondary | ICD-10-CM

## 2017-03-14 DIAGNOSIS — L089 Local infection of the skin and subcutaneous tissue, unspecified: Secondary | ICD-10-CM | POA: Diagnosis not present

## 2017-03-14 MED ORDER — DOXYCYCLINE HYCLATE 100 MG PO TABS: 100.0000 mg | ORAL_TABLET | Freq: Two times a day (BID) | ORAL | 0 refills | Status: DC

## 2017-03-14 NOTE — Patient Instructions (Signed)
Call if not improved  F/U as needed  

## 2017-03-14 NOTE — Progress Notes (Signed)
   Subjective:    Patient ID: [name], female    DOB: [birth year], 56 y.o.   MRN: [id number]  Patient presents for Abscess (abscess to crease of leg at buttocks- has started to drain)   Pt here with abscess to  Left groin for the past 3-4 days, she used a drawing salve and it did start to drain yesterday. She has not had any fever or chills no recent illness. She does have history of MRSA infection has multiple chronic medical problems. She has admitted to come in contact with a coworker recently admitted with MRSA infection in his hand. She has had an abscess once before that had to be drained on her buttocks region.   Review Of Systems:  GEN- denies fatigue, fever, weight loss,weakness, recent illness HEENT- denies eye drainage denies change in vision, nasal discharge, CVS- denies chest pain, palpitations RESP- denies SOB, cough, wheeze ABD- denies N/V, change in stools, abd pain Neuro- deniesheadache, dizziness, syncope, seizure activity       Objective:    BP 140/88   Pulse 62   Temp 97.8 F (36.6 C) (Oral)   Resp 14   Ht 5\' 2"  (1.575 m)   Wt 136 lb (61.7 kg)   SpO2 98%   BMI 24.87 kg/m  GEN- NAD, alert and oriented x3 Skin- left lower groin near buttocks quarter size nodule, TTP, mild fluctance, erythema surrounding and extending to gluteal cleft about 2 inches diameter.  Procedure- Incision and Drainage Procedure explained to patient questions answered benefits and risks discussed Verbal consent obtained. Antiseptic-Betadine Anesthesia-lidocaine  1%with epi Incision performed small amount of pus expressed, cystic sheath noted with some cyst contents expressed Culture taken Minimal blood loss Patient tolerated procedure well Bandage applied         Assessment & Plan:      Problem List Items Addressed This Visit    None    Visit Diagnoses    Infected cyst of skin    -  Primary   Pus removed with initial incision but then some cystic material,  culture sent , start doxycycline as some overlying cellulitis as well, if this does not resolveThen she will need surgical incision when I think is the underlying cyst    Cellulitis of left groin          Note: This dictation was prepared with Dragon dictation along with smaller phrase technology. Any transcriptional errors that result from this process are unintentional.

## 2017-03-17 LAB — WOUND CULTURE: Gram Stain: NONE SEEN

## 2017-03-20 ENCOUNTER — Other Ambulatory Visit: Payer: Self-pay | Admitting: *Deleted

## 2017-03-20 MED ORDER — MUPIROCIN 2 % EX OINT: 1.0000 "application " | TOPICAL_OINTMENT | Freq: Two times a day (BID) | CUTANEOUS | 0 refills | Status: DC

## 2017-05-11 ENCOUNTER — Encounter (HOSPITAL_COMMUNITY): Payer: Self-pay | Admitting: *Deleted

## 2017-05-12 NOTE — Anesthesia Preprocedure Evaluation (Addendum)
Anesthesia Evaluation  Patient identified by MRN, date of birth, ID band Patient awake    Reviewed: Allergy & Precautions, NPO status , Patient's Chart, lab work & pertinent test results  Airway Mallampati: I  TM Distance: >3 FB Neck ROM: Full    Dental  (+) Upper Dentures, Lower Dentures   Pulmonary asthma , COPD, former smoker,    breath sounds clear to auscultation       Cardiovascular negative cardio ROS   Rhythm:Regular Rate:Normal     Neuro/Psych PSYCHIATRIC DISORDERS Anxiety Depression PTSDChronic right hand/wrist pain; right visual deficiencies related to CVA CVA, Residual Symptoms    GI/Hepatic GERD  Medicated and Controlled,(+)     substance abuse  alcohol use, Ulcerative Colitis   Endo/Other  negative endocrine ROS  Renal/GU negative Renal ROS  negative genitourinary   Musculoskeletal  (+) Arthritis ,   Abdominal   Peds  Hematology negative hematology ROS (+)   Anesthesia Other Findings Fibromyalgia  Reproductive/Obstetrics                            Anesthesia Physical  Anesthesia Plan  ASA: III  Anesthesia Plan: General   Post-op Pain Management:    Induction: Intravenous  PONV Risk Score and Plan: 4 or greater and Ondansetron, Dexamethasone, Midazolam, Scopolamine patch - Pre-op, Propofol infusion and Treatment may vary due to age or medical condition  Airway Management Planned: LMA  Additional Equipment:   Intra-op Plan:   Post-operative Plan: Extubation in OR  Informed Consent: I have reviewed the patients History and Physical, chart, labs and discussed the procedure including the risks, benefits and alternatives for the proposed anesthesia with the patient or authorized representative who has indicated his/her understanding and acceptance.   Dental advisory given  Plan Discussed with: CRNA  Anesthesia Plan Comments:        Anesthesia Quick  Evaluation

## 2017-05-13 ENCOUNTER — Ambulatory Visit (HOSPITAL_COMMUNITY): Payer: Worker's Compensation | Admitting: Anesthesiology

## 2017-05-13 ENCOUNTER — Ambulatory Visit (HOSPITAL_COMMUNITY)
Admission: RE | Admit: 2017-05-13 | Discharge: 2017-05-13 | Disposition: A | Discharge status: Home / Self Care | Payer: Worker's Compensation | Source: Ambulatory Visit | Attending: Orthopedic Surgery | Admitting: Orthopedic Surgery

## 2017-05-13 ENCOUNTER — Encounter (HOSPITAL_COMMUNITY)
Admission: RE | Disposition: A | Discharge status: Home / Self Care | Payer: Self-pay | Source: Ambulatory Visit | Attending: Orthopedic Surgery

## 2017-05-13 ENCOUNTER — Encounter (HOSPITAL_COMMUNITY): Payer: Self-pay | Admitting: *Deleted

## 2017-05-13 DIAGNOSIS — Z885 Allergy status to narcotic agent status: Secondary | ICD-10-CM | POA: Diagnosis not present

## 2017-05-13 DIAGNOSIS — Y929 Unspecified place or not applicable: Secondary | ICD-10-CM | POA: Diagnosis not present

## 2017-05-13 DIAGNOSIS — K5190 Ulcerative colitis, unspecified, without complications: Secondary | ICD-10-CM | POA: Diagnosis not present

## 2017-05-13 DIAGNOSIS — Z8673 Personal history of transient ischemic attack (TIA), and cerebral infarction without residual deficits: Secondary | ICD-10-CM | POA: Diagnosis not present

## 2017-05-13 DIAGNOSIS — Z79899 Other long term (current) drug therapy: Secondary | ICD-10-CM | POA: Diagnosis not present

## 2017-05-13 DIAGNOSIS — X58XXXA Exposure to other specified factors, initial encounter: Secondary | ICD-10-CM | POA: Insufficient documentation

## 2017-05-13 DIAGNOSIS — F4310 Post-traumatic stress disorder, unspecified: Secondary | ICD-10-CM | POA: Diagnosis not present

## 2017-05-13 DIAGNOSIS — K219 Gastro-esophageal reflux disease without esophagitis: Secondary | ICD-10-CM | POA: Insufficient documentation

## 2017-05-13 DIAGNOSIS — M797 Fibromyalgia: Secondary | ICD-10-CM | POA: Insufficient documentation

## 2017-05-13 DIAGNOSIS — S68117D Complete traumatic metacarpophalangeal amputation of left little finger, subsequent encounter: Secondary | ICD-10-CM | POA: Diagnosis not present

## 2017-05-13 DIAGNOSIS — J449 Chronic obstructive pulmonary disease, unspecified: Secondary | ICD-10-CM | POA: Insufficient documentation

## 2017-05-13 DIAGNOSIS — S61217A Laceration without foreign body of left little finger without damage to nail, initial encounter: Secondary | ICD-10-CM | POA: Diagnosis not present

## 2017-05-13 DIAGNOSIS — J209 Acute bronchitis, unspecified: Secondary | ICD-10-CM | POA: Diagnosis not present

## 2017-05-13 DIAGNOSIS — F329 Major depressive disorder, single episode, unspecified: Secondary | ICD-10-CM | POA: Diagnosis not present

## 2017-05-13 DIAGNOSIS — Z87891 Personal history of nicotine dependence: Secondary | ICD-10-CM | POA: Insufficient documentation

## 2017-05-13 DIAGNOSIS — Z87442 Personal history of urinary calculi: Secondary | ICD-10-CM | POA: Insufficient documentation

## 2017-05-13 DIAGNOSIS — Z89022 Acquired absence of left finger(s): Secondary | ICD-10-CM | POA: Insufficient documentation

## 2017-05-13 DIAGNOSIS — S68117A Complete traumatic metacarpophalangeal amputation of left little finger, initial encounter: Secondary | ICD-10-CM | POA: Diagnosis not present

## 2017-05-13 HISTORY — DX: Personal history of urinary calculi: Z87.442

## 2017-05-13 HISTORY — DX: Ulcerative colitis, unspecified, without complications: K51.90

## 2017-05-13 HISTORY — PX: AMPUTATION: SHX166

## 2017-05-13 LAB — BASIC METABOLIC PANEL
Anion gap: 8 (ref 5–15)
BUN: 11 mg/dL (ref 6–20)
CHLORIDE: 108 mmol/L (ref 101–111)
CO2: 25 mmol/L (ref 22–32)
Calcium: 9.4 mg/dL (ref 8.9–10.3)
Creatinine, Ser: 0.94 mg/dL (ref 0.44–1.00)
Glucose, Bld: 99 mg/dL (ref 65–99)
POTASSIUM: 4.3 mmol/L (ref 3.5–5.1)
SODIUM: 141 mmol/L (ref 135–145)

## 2017-05-13 SURGERY — AMPUTATION DIGIT
Anesthesia: General | Site: Finger | Laterality: Left

## 2017-05-13 MED ORDER — EPHEDRINE SULFATE 50 MG/ML IJ SOLN
INTRAMUSCULAR | Status: DC | PRN
  Administered 2017-05-13: 10 mg via INTRAVENOUS

## 2017-05-13 MED ORDER — DEXAMETHASONE SODIUM PHOSPHATE 4 MG/ML IJ SOLN
INTRAMUSCULAR | Status: DC | PRN
  Administered 2017-05-13: 10 mg via INTRAVENOUS

## 2017-05-13 MED ORDER — MIDAZOLAM HCL 5 MG/5ML IJ SOLN
INTRAMUSCULAR | Status: DC | PRN
  Administered 2017-05-13: 2 mg via INTRAVENOUS

## 2017-05-13 MED ORDER — 0.9 % SODIUM CHLORIDE (POUR BTL) OPTIME
TOPICAL | Status: DC | PRN
  Administered 2017-05-13: 1000 mL

## 2017-05-13 MED ORDER — CEFAZOLIN SODIUM-DEXTROSE 2-4 GM/100ML-% IV SOLN
INTRAVENOUS | Status: AC
  Filled 2017-05-13: qty 100

## 2017-05-13 MED ORDER — PROPOFOL 10 MG/ML IV BOLUS
INTRAVENOUS | Status: DC | PRN
  Administered 2017-05-13: 120 mg via INTRAVENOUS

## 2017-05-13 MED ORDER — CEFAZOLIN SODIUM-DEXTROSE 2-4 GM/100ML-% IV SOLN
2.0000 g | INTRAVENOUS | Status: AC
  Administered 2017-05-13: 2 g via INTRAVENOUS

## 2017-05-13 MED ORDER — FENTANYL CITRATE (PF) 250 MCG/5ML IJ SOLN
INTRAMUSCULAR | Status: AC
  Filled 2017-05-13: qty 5

## 2017-05-13 MED ORDER — MIDAZOLAM HCL 2 MG/2ML IJ SOLN
INTRAMUSCULAR | Status: AC
  Filled 2017-05-13: qty 2

## 2017-05-13 MED ORDER — LIDOCAINE HCL (CARDIAC) 20 MG/ML IV SOLN
INTRAVENOUS | Status: DC | PRN
  Administered 2017-05-13: 60 mg via INTRAVENOUS

## 2017-05-13 MED ORDER — PROMETHAZINE HCL 25 MG/ML IJ SOLN: 6.2500 mg | INTRAMUSCULAR | Status: DC | PRN

## 2017-05-13 MED ORDER — PROPOFOL 10 MG/ML IV BOLUS
INTRAVENOUS | Status: AC
  Filled 2017-05-13: qty 20

## 2017-05-13 MED ORDER — LACTATED RINGERS IV SOLN
INTRAVENOUS | Status: DC
Start: 2017-05-13 — End: 2017-05-13
  Administered 2017-05-13: 09:00:00 via INTRAVENOUS

## 2017-05-13 MED ORDER — FENTANYL CITRATE (PF) 100 MCG/2ML IJ SOLN
INTRAMUSCULAR | Status: DC | PRN
  Administered 2017-05-13: 50 ug via INTRAVENOUS

## 2017-05-13 MED ORDER — BUPIVACAINE HCL (PF) 0.25 % IJ SOLN
INTRAMUSCULAR | Status: DC | PRN
  Administered 2017-05-13: 7 mL

## 2017-05-13 MED ORDER — FENTANYL CITRATE (PF) 100 MCG/2ML IJ SOLN
INTRAMUSCULAR | Status: AC
  Administered 2017-05-13: 50 ug via INTRAVENOUS
  Filled 2017-05-13: qty 2

## 2017-05-13 MED ORDER — FENTANYL CITRATE (PF) 100 MCG/2ML IJ SOLN
INTRAMUSCULAR | Status: AC
  Filled 2017-05-13: qty 2

## 2017-05-13 MED ORDER — POVIDONE-IODINE 10 % EX SWAB: 2.0000 "application " | Freq: Once | CUTANEOUS | Status: DC

## 2017-05-13 MED ORDER — FENTANYL CITRATE (PF) 100 MCG/2ML IJ SOLN
25.0000 ug | INTRAMUSCULAR | Status: DC | PRN
  Administered 2017-05-13 (×3): 50 ug via INTRAVENOUS

## 2017-05-13 MED ORDER — TAPENTADOL HCL 50 MG PO TABS: 50.0000 mg | ORAL_TABLET | ORAL | 0 refills | Status: DC | PRN

## 2017-05-13 MED ORDER — CHLORHEXIDINE GLUCONATE 4 % EX LIQD: 60.0000 mL | Freq: Once | CUTANEOUS | Status: DC

## 2017-05-13 SURGICAL SUPPLY — 44 items
BANDAGE ACE 3X5.8 VEL STRL LF (GAUZE/BANDAGES/DRESSINGS) IMPLANT
BANDAGE ACE 4X5 VEL STRL LF (GAUZE/BANDAGES/DRESSINGS) IMPLANT
BNDG COHESIVE 1X5 TAN STRL LF (GAUZE/BANDAGES/DRESSINGS) ×3 IMPLANT
BNDG CONFORM 2 STRL LF (GAUZE/BANDAGES/DRESSINGS) ×3 IMPLANT
BNDG GAUZE ELAST 4 BULKY (GAUZE/BANDAGES/DRESSINGS) ×6 IMPLANT
CORDS BIPOLAR (ELECTRODE) ×3 IMPLANT
COVER SURGICAL LIGHT HANDLE (MISCELLANEOUS) ×3 IMPLANT
CUFF TOURNIQUET SINGLE 18IN (TOURNIQUET CUFF) ×3 IMPLANT
CUFF TOURNIQUET SINGLE 24IN (TOURNIQUET CUFF) IMPLANT
DRAPE SURG 17X23 STRL (DRAPES) ×3 IMPLANT
GAUZE SPONGE 2X2 8PLY STRL LF (GAUZE/BANDAGES/DRESSINGS) ×1 IMPLANT
GAUZE SPONGE 4X4 12PLY STRL (GAUZE/BANDAGES/DRESSINGS) ×3 IMPLANT
GAUZE XEROFORM 1X8 LF (GAUZE/BANDAGES/DRESSINGS) ×3 IMPLANT
GLOVE BIOGEL M 8.0 STRL (GLOVE) ×3 IMPLANT
GLOVE SS BIOGEL STRL SZ 8 (GLOVE) ×1 IMPLANT
GLOVE SUPERSENSE BIOGEL SZ 8 (GLOVE) ×2
GOWN STRL REUS W/ TWL LRG LVL3 (GOWN DISPOSABLE) ×1 IMPLANT
GOWN STRL REUS W/ TWL XL LVL3 (GOWN DISPOSABLE) ×2 IMPLANT
GOWN STRL REUS W/TWL LRG LVL3 (GOWN DISPOSABLE) ×2
GOWN STRL REUS W/TWL XL LVL3 (GOWN DISPOSABLE) ×4
KIT BASIN OR (CUSTOM PROCEDURE TRAY) ×3 IMPLANT
KIT ROOM TURNOVER OR (KITS) ×3 IMPLANT
MANIFOLD NEPTUNE II (INSTRUMENTS) IMPLANT
NEEDLE HYPO 25GX1X1/2 BEV (NEEDLE) ×3 IMPLANT
NS IRRIG 1000ML POUR BTL (IV SOLUTION) ×3 IMPLANT
PACK ORTHO EXTREMITY (CUSTOM PROCEDURE TRAY) ×3 IMPLANT
PAD ARMBOARD 7.5X6 YLW CONV (MISCELLANEOUS) ×6 IMPLANT
PAD CAST 4YDX4 CTTN HI CHSV (CAST SUPPLIES) IMPLANT
PADDING CAST COTTON 4X4 STRL (CAST SUPPLIES)
SCRUB BETADINE 4OZ XXX (MISCELLANEOUS) ×3 IMPLANT
SOL PREP POV-IOD 4OZ 10% (MISCELLANEOUS) ×6 IMPLANT
SPECIMEN JAR SMALL (MISCELLANEOUS) IMPLANT
SPLINT FINGER 5.25 W/BULB ALUM (SOFTGOODS) ×3 IMPLANT
SPONGE GAUZE 2X2 STER 10/PKG (GAUZE/BANDAGES/DRESSINGS) ×2
SUT CHROMIC 5 0 P 3 (SUTURE) ×3 IMPLANT
SUT MERSILENE 4 0 P 3 (SUTURE) IMPLANT
SUT PROLENE 4 0 PS 2 18 (SUTURE) ×3 IMPLANT
SYR CONTROL 10ML LL (SYRINGE) ×3 IMPLANT
TOWEL OR 17X24 6PK STRL BLUE (TOWEL DISPOSABLE) ×3 IMPLANT
TOWEL OR 17X26 10 PK STRL BLUE (TOWEL DISPOSABLE) ×3 IMPLANT
TUBE CONNECTING 12'X1/4 (SUCTIONS) ×1
TUBE CONNECTING 12X1/4 (SUCTIONS) ×2 IMPLANT
UNDERPAD 30X30 (UNDERPADS AND DIAPERS) ×3 IMPLANT
WATER STERILE IRR 1000ML POUR (IV SOLUTION) ×3 IMPLANT

## 2017-05-13 NOTE — Op Note (Signed)
See dictation [phone] SP I&D and rev amput Left Small finger with open Fx Tx Jerl Munyan MD

## 2017-05-13 NOTE — Anesthesia Postprocedure Evaluation (Signed)
Anesthesia Post Note  Patient: [name]  Procedure(s) Performed: Procedure(s) (LRB): Left small finger revision amputation  (Left)     Patient location during evaluation: PACU Anesthesia Type: General Level of consciousness: awake and alert Pain management: pain level controlled Vital Signs Assessment: post-procedure vital signs reviewed and stable Respiratory status: spontaneous breathing, nonlabored ventilation, respiratory function stable and patient connected to nasal cannula oxygen Cardiovascular status: blood pressure returned to baseline and stable Postop Assessment: no signs of nausea or vomiting Anesthetic complications: no    Last Vitals:  Vitals:   05/13/17 1055 05/13/17 1100  BP: (!) 146/89   Pulse: (!) 49   Resp: 18   Temp:  (!) 36.3 C  SpO2: 100%     Last Pain:  Vitals:   05/13/17 1108  TempSrc:   PainSc: [name]

## 2017-05-13 NOTE — Anesthesia Procedure Notes (Signed)
Procedure Name: LMA Insertion Date/Time: 05/13/2017 9:46 AM Performed by: Clearnce Sorrel Pre-anesthesia Checklist: Patient identified, Emergency Drugs available, Suction available, Patient being monitored and Timeout performed Patient Re-evaluated:Patient Re-evaluated prior to induction Oxygen Delivery Method: Circle system utilized Preoxygenation: Pre-oxygenation with 100% oxygen Induction Type: IV induction LMA: LMA inserted LMA Size: 4.0 Number of attempts: 1 Placement Confirmation: positive ETCO2 and breath sounds checked- equal and bilateral Tube secured with: Tape Dental Injury: Teeth and Oropharynx as per pre-operative assessment

## 2017-05-13 NOTE — Transfer of Care (Signed)
Immediate Anesthesia Transfer of Care Note  Patient: [name]  Procedure(s) Performed: Procedure(s) with comments: Left small finger revision amputation  (Left) - 60 mins  Patient Location: PACU  Anesthesia Type:General  Level of Consciousness: awake, alert  and oriented  Airway & Oxygen Therapy: Patient Spontanous Breathing and Patient connected to nasal cannula oxygen  Post-op Assessment: Report given to RN and Post -op Vital signs reviewed and stable  Post vital signs: Reviewed and stable  Last Vitals:  Vitals:   05/13/17 0740  BP: (!) 169/84  Pulse: (!) 51  Resp: 18  Temp: 36.5 C  SpO2: 100%    Last Pain:  Vitals:   05/13/17 0749  TempSrc:   PainSc: 5       Patients Stated Pain Goal: 6 (47/15/95 3967)  Complications: No apparent anesthesia complications

## 2017-05-13 NOTE — Discharge Instructions (Signed)

## 2017-05-13 NOTE — H&P (Signed)
[name] is an 56 y.o. female.   Chief Complaint: Left Small finger amputation HPI: Patient presents for evaluation and treatment of the of their upper extremity predicament. The patient denies neck, back, chest or  abdominal pain. The patient notes that they have no lower extremity problems. The patients primary complaint is noted. We are planning surgical care pathway for the upper extremity.  Past Medical History:  Diagnosis Date  . Anxiety    BILATERAL HANDS-- LEFT >RIGHT AND LEGS  . Arthritis   . Asthma    NO INHALER  . Bed bug bite   . Blood transfusion without reported diagnosis   . CA - skin cancer   . Chronic pain due to injury RIGHT HAND/WRIST IN 2007  . Complication of anesthesia    unable to urinate after surgery  . COPD (chronic obstructive pulmonary disease) (HCC)    Stage 1  . Crush injury of hand RIGHT IN 2007   X 6 SURG'S  LAST ONE BEING TOTAL WRIST FUSION W/ BONE GRAFT AND PLATE  . Cubital tunnel syndrome    Left elbow  . Depression   . ETOH abuse   . GERD (gastroesophageal reflux disease)    05/11/17- not recently  . Hemorrhoids   . History of kidney stones    passed  . MRSA (methicillin resistant Staphylococcus aureus) carrier   . Neuromuscular disorder (HCC)    some fibromyalgia  . Neuropathy   . PTSD (post-traumatic stress disorder) 2007 RIGHT HAND CRUSH INJURY W/ 2 FINGER AMPUTATION  . Stroke ([place]) X2 CVA'S  IN 1990   LOSS OF VISION RIGHT Peripherial bilteral  . Substance abuse    hx: narcotic use  . UC (ulcerative colitis) ([place])   . Weakness of right leg CAUSED BY RETRIEVAL THIGH MUSCLE FOR FLAP RIGHT HAND INJURY-- 2006   RIGHT LEG GIVES OUT OCCASIONALLY    Past Surgical History:  Procedure Laterality Date  . COLON SURGERY     15 years ago- fistula on colon removed  . COSMETIC SURGERY     right leg and abdomen  . DILATATION & CURETTAGE/HYSTEROSCOPY WITH MYOSURE N/A 06/14/2016   Procedure: Hyder;  Surgeon: Anastasio Auerbach, MD;  [facility];  Service: Gynecology;  Laterality: N/A;  . HAND CARPECTOMY  12-24-2005   RIGHT-- INCLUDING  REMOVAL  WRIST WIRES AND I &D (EXICIOSNAL)  FOR OSTEOMYELITIS  . HYSTEROSCOPY  12.6.2012   w/removal of polyp  . HYSTEROSCOPY W/D&C  08/18/2011   Procedure: DILATATION AND CURETTAGE (D&C) /HYSTEROSCOPY;  Surgeon: Anastasio Auerbach, MD;  Location: [facility];  Service: Gynecology;  Laterality: N/A;  . INCISION / DRAINAGE HAND / FINGER  X3  (2006 & 2007)   RIGHT CRUSH WOUND  . LESION REMOVAL  08/18/2011   Procedure: EXCISION VAGINAL LESION;  Surgeon: Anastasio Auerbach, MD;  Location: [facility];  Service: Gynecology;  Laterality: N/A;  . ORIF CARPAL BONE FRACTURE  08-31-05   I & D OF RIGHT HAND CRUSH INJURY/ OPEN FX'S AND INDEX FINGER RAY RESECTION/ COMPLICATED CLOSURE  . ORIF RADIAL FRACTURE  09-04-05   DISTAL RADIAL PLATE AND PARTIAL THUMB AMPUTATION  . RIGHT TOTAL WRIST FUSION W/ BONE GRAFT AND PLATE  [id number]   AND FLAP USING RIGHT GOIN MUSCLE  . skin cancer removal  2007   throat area  . TUBAL LIGATION  15 YRS AGO    Family History  Problem Relation Age of Onset  .  Lupus Mother        died at 48  . Breast cancer Maternal Aunt 35  . Breast cancer Maternal Grandmother 60  . Stomach cancer Paternal Grandfather   . Colon cancer Neg Hx   . Rectal cancer Neg Hx   . Esophageal cancer Neg Hx    Social History:  reports that she quit smoking about 15 years ago. Her smoking use included Cigarettes. She has a 42.00 pack-year smoking history. She has never used smokeless tobacco. She reports that she drinks alcohol. She reports that she does not use drugs.  Allergies:  Allergies  Allergen Reactions  . Aspirin Shortness Of Breath    ASTHMATIC ATTACK  . Codeine Other (See Comments)    ABNORMAL BEHAVIOR  Increased in bad moods  NO CODEINE BASED MEDICATIONS   . Vicodin [Hydrocodone-Acetaminophen]  Other (See Comments)    Extreme anxiety    Medications Prior to Admission  Medication Sig Dispense Refill  . CANASA 1000 MG suppository PLACE 1 SUPPOSITORY (1,000 MG TOTAL) RECTALLY AT BEDTIME. (Patient taking differently: Place 1,000 mg rectally daily. ) 30 suppository 6  . gabapentin (NEURONTIN) 300 MG capsule TAKE ONE CAPSULE BY MOUTH 3 TIMES A DAY 270 capsule 1  . sulfamethoxazole-trimethoprim (BACTRIM DS,SEPTRA DS) 800-160 MG tablet Take 1 tablet by mouth 2 (two) times daily. PT STARTED 05/10/09 - 14 DAY SUPPLY    . tapentadol (NUCYNTA) 50 MG tablet Take 100 mg by mouth every 4 (four) hours as needed.    . umeclidinium bromide (INCRUSE ELLIPTA) 62.5 MCG/INH AEPB Inhale 1 puff into the lungs daily. 1 each 5  . vitamin B-12 (CYANOCOBALAMIN) 500 MCG tablet Take 500 mcg by mouth daily.    Marland Kitchen albuterol (PROVENTIL HFA;VENTOLIN HFA) 108 (90 Base) MCG/ACT inhaler Inhale 2 puffs into the lungs every 6 (six) hours as needed for wheezing or shortness of breath. 1 Inhaler 1    No results found for this or any previous visit (from the past 48 hour(s)). No results found.  Review of Systems  Respiratory: Negative.   Cardiovascular: Negative.   Gastrointestinal: Negative.   Genitourinary: Negative.     Blood pressure (!) 169/84, pulse (!) 51, temperature 97.7 F (36.5 C), temperature source Oral, resp. rate 18, height 5' 2"  (1.575 m), weight 60.3 kg (133 lb), SpO2 100 %. Physical Exam left SF amput  The patient is alert and oriented in no acute distress. The patient complains of pain in the affected upper extremity.  The patient is noted to have a normal HEENT exam. Lung fields show equal chest expansion and no shortness of breath. Abdomen exam is nontender without distention. Lower extremity examination does not show any fracture dislocation or blood clot symptoms. Pelvis is stable and the neck and back are stable and nontender.  Assessment/Plan Plan for I&D and left Small finger revision  amputation  We are planning surgery for your upper extremity. The risk and benefits of surgery to include risk of bleeding, infection, anesthesia,  damage to normal structures and failure of the surgery to accomplish its intended goals of relieving symptoms and restoring function have been discussed in detail. With this in mind we plan to proceed. I have specifically discussed with the patient the pre-and postoperative regime and the dos and don'ts and risk and benefits in great detail. Risk and benefits of surgery also include risk of dystrophy(CRPS), chronic nerve pain, failure of the healing process to go onto completion and other inherent risks of surgery The relavent the  pathophysiology of the disease/injury process, as well as the alternatives for treatment and postoperative course of action has been discussed in great detail with the patient who desires to proceed.  We will do everything in our power to help you (the patient) restore function to the upper extremity. It is a pleasure to see this patient today.   Paulene Floor, MD 05/13/2017, 7:51 AM

## 2017-05-14 ENCOUNTER — Encounter (HOSPITAL_COMMUNITY): Payer: Self-pay | Admitting: Orthopedic Surgery

## 2017-05-15 NOTE — Op Note (Signed)
NAME:  [name], [name]                  ACCOUNT NO.:  MEDICAL RECORD NO.:  [id number]  LOCATION:                                 FACILITY:  PHYSICIAN:  Satira Anis. Amedeo Plenty, M.D.     DATE OF BIRTH:  DATE OF PROCEDURE:  05/13/2017 DATE OF DISCHARGE:  05/13/2017                              OPERATIVE REPORT   PREOPERATIVE DIAGNOSIS:  Left small finger amputation at the distal hand.  POSTOPERATIVE DIAGNOSIS:  Left small finger amputation at the distal hand.  PROCEDURES: 1. Revision amputation with volar advancement flap, left small finger. 2. Irrigation and debridement of skin, subcutaneous tissue, bone, and     associated soft tissue structures including nail bed, left small     finger.  This was an excisional debridement with curette, knife,     blade, and scissors.  SURGEON:  Satira Anis. Amedeo Plenty, M.D.  ASSISTANT:  Avelina Laine, P.A.-C.  COMPLICATIONS:  None.  ANESTHESIA:  General.  TOURNIQUET TIME:  Less than 30 minutes.  INDICATIONS:  A 56 year old female with multiple upper extremity issues, who complains of small finger amputation.  She was originally triaged in [facility].  Since that time, we have very carefully and cautiously matriculated her through some wound care, so that we can perform the definitive revision amputation today.  She understands risks and benefits and desires to proceed.  DESCRIPTION OF PROCEDURE:  The patient was seen by myself and Anesthesia.  Taken to the operative theater, underwent smooth induction of general anesthetic, prepped and draped in usual sterile fashion with Betadine scrub and paint.  Preoperatively, 2 Hibiclens scrubs by myself were performed proceeding the Betadine scrub and paint by Mr. Jenean Lindau.  Following this, the patient underwent I and D of skin, subcutaneous tissue, bone, tendon, and associated soft tissue structures.  Rongeur was used to carefully smooth the distal phalanx fracture.  Open treatment of the distal  phalanx fracture ensued without difficulty, and a sculpting technique was used without complicating feature.  Once this was complete, a V-Y incision was made for an Atasoy flap.  We very carefully incised the area.  The area was very carefully mobilized without any excessive tension on the neurovascular structures. Following this, we then prepared the nail bed, performed a nail bed and volar flap coaptation with chromic suture.  Following the volar flap advancement, the patient had the tourniquet deflated.  Hemostasis was secured without difficulty.  I did not use cautery as I did not want to jeopardize our flap.  The patient had no complicating features.  Thus, this was an I and D of skin, subcutaneous tissue, bone, and associated soft tissue structures secondary to amputation, open treatment of distal phalanx fracture, volar advancement flap secondary to amputation.  The patient tolerated this well.  There were no complicating features.  The patient was dressed sterilely without tension on the area.  I did leave the areas about the Atasoy flap open in the more proximal regions, and chose not to close these, so there would be no tension on the neurovascular structures.  We will monitor her closely and see her back in 10-14 days with therapy immediately following, so that  we can simply assess the viability of her flap and healing today.  She will heal in by secondary intention proximally; however, the bony fracture and amputation site are now covered, and I think this will be in her best interest of course.  These notes have been discussed, and all questions have been encouraged and answered.  She will be discharged home on Nucynta.  In addition to this, she will continue her outpatient antibiotic regime.  These notes have been discussed, and all questions have been encouraged and answered.  Should any problems occur, she will notify us.  We recommend healthy living habits and  gone over this with her at length.     Satira Anis. Amedeo Plenty, M.D.     [facility]  D:  05/13/2017  T:  05/13/2017  Job:  [id number]

## 2017-05-16 ENCOUNTER — Ambulatory Visit (INDEPENDENT_AMBULATORY_CARE_PROVIDER_SITE_OTHER): Payer: Medicare Other | Admitting: Gynecology

## 2017-05-16 ENCOUNTER — Encounter: Payer: Self-pay | Admitting: Gynecology

## 2017-05-16 VITALS — BP 130/76 | Ht 62.0 in | Wt 130.0 lb

## 2017-05-16 DIAGNOSIS — N952 Postmenopausal atrophic vaginitis: Secondary | ICD-10-CM | POA: Diagnosis not present

## 2017-05-16 DIAGNOSIS — Z113 Encounter for screening for infections with a predominantly sexual mode of transmission: Secondary | ICD-10-CM

## 2017-05-16 DIAGNOSIS — Z01411 Encounter for gynecological examination (general) (routine) with abnormal findings: Secondary | ICD-10-CM

## 2017-05-16 NOTE — Progress Notes (Signed)
    EH SAUSEDA [birth year] [id number]        55 y.o.  G6P6 for annual exam.  Recently under went a traumatic amputation of the distal portion of her left small finger. Is currently recovering from that.  Past medical history,surgical history, problem list, medications, allergies, family history and social history were all reviewed and documented as reviewed in the EPIC chart.  ROS:  Performed with pertinent positives and negatives included in the history, assessment and plan.   Additional significant findings :  None   Exam: [name] assistant Vitals:   05/16/17 1537  BP: 130/76  Weight: 130 lb (59 kg)  Height: 5' 2"  (1.575 m)   Body mass index is 23.78 kg/m.  General appearance:  Normal affect, orientation and appearance. Skin: Grossly normal HEENT: Without gross lesions.  No cervical or supraclavicular adenopathy. Thyroid normal.  Lungs:  Clear without wheezing, rales or rhonchi Cardiac: RR, without RMG Abdominal:  Soft, nontender, without masses, guarding, rebound, organomegaly or hernia Breasts:  Examined lying and sitting without masses, retractions, discharge or axillary adenopathy. Pelvic:  Ext, BUS, Vagina: With atrophic changes  Cervix: With atrophic changes. GC/Chlamydia  Uterus: Anteverted, normal size, shape and contour, midline and mobile nontender   Adnexa: Without masses or tenderness    Anus and perineum: Normal   Rectovaginal: Normal sphincter tone without palpated masses or tenderness.    Assessment/Plan:  56 y.o. G6P6 female for annual exam.   1. STD screening. Patient notes that her partner has multiple partners. No known exposure but the patient questions whether she needs to be screened for STDs which I encouraged her to do so. GC/Chlamydia, HIV, RPR, hepatitis B, hepatitis C done. 2. Pap smear/HPV 2017. No Pap smear done today. No history of significant abnormal Pap smears. Plan repeat Pap smear at 5 year interval per current screening  guidelines. 3. Mammography 08/2016. Continue with annual mammography end of this year. Breast exam normal today. 4. Colonoscopy 2014. Repeat at their recommended interval. 5. DEXA never. Will plan further into the menopause. 6. Health maintenance. No routine lab work done as patient reports this done elsewhere. Follow up 1 year, sooner as needed.   Anastasio Auerbach MD, 4:02 PM 05/16/2017

## 2017-05-16 NOTE — Patient Instructions (Signed)
Followup in one year for annual exam, sooner if any issues 

## 2017-05-17 LAB — HIV ANTIBODY (ROUTINE TESTING W REFLEX): HIV 1&2 Ab, 4th Generation: NONREACTIVE

## 2017-05-17 LAB — HEPATITIS C ANTIBODY: HCV Ab: NONREACTIVE

## 2017-05-17 LAB — HEPATITIS B SURFACE ANTIGEN: HEP B S AG: NONREACTIVE

## 2017-05-17 LAB — GC/CHLAMYDIA PROBE AMP
CT PROBE, AMP APTIMA: NOT DETECTED
GC Probe RNA: NOT DETECTED

## 2017-05-17 LAB — RPR

## 2017-06-15 ENCOUNTER — Encounter: Payer: Self-pay | Admitting: Physician Assistant

## 2017-06-15 ENCOUNTER — Ambulatory Visit (INDEPENDENT_AMBULATORY_CARE_PROVIDER_SITE_OTHER): Payer: Medicare Other | Admitting: Physician Assistant

## 2017-06-15 VITALS — BP 130/74 | HR 64 | Temp 97.9°F | Resp 14 | Ht 62.0 in | Wt 132.0 lb

## 2017-06-15 DIAGNOSIS — L309 Dermatitis, unspecified: Secondary | ICD-10-CM

## 2017-06-15 DIAGNOSIS — Z22322 Carrier or suspected carrier of Methicillin resistant Staphylococcus aureus: Secondary | ICD-10-CM | POA: Diagnosis not present

## 2017-06-15 MED ORDER — SULFAMETHOXAZOLE-TRIMETHOPRIM 800-160 MG PO TABS: 1.0000 | ORAL_TABLET | Freq: Two times a day (BID) | ORAL | 0 refills | Status: DC

## 2017-06-15 NOTE — Progress Notes (Signed)
Patient ID: [name] MRN: [id number], DOB: 1960-09-20, 56 y.o. Date of Encounter: 06/15/2017, 10:23 AM    Chief Complaint:  Chief Complaint  Patient presents with  . Rash    x2 weeks- pus filled--  on R buttocks/ hip- thinks it's MRSA     HPI: 56 y.o. year old female presents with above.  She states that her right buttock is covered with multiple bumps and some of these seem to have "blisters "that when she mashes has white drainage.   I reviewed that she does have a wound culture report from 03/14/17 which showed positive MRSA. That was sensitive to both Bactrim and doxycycline.  Home Meds:   Outpatient Medications Prior to Visit  Medication Sig Dispense Refill  . albuterol (PROVENTIL HFA;VENTOLIN HFA) 108 (90 Base) MCG/ACT inhaler Inhale 2 puffs into the lungs every 6 (six) hours as needed for wheezing or shortness of breath. 1 Inhaler 1  . CANASA 1000 MG suppository PLACE 1 SUPPOSITORY (1,000 MG TOTAL) RECTALLY AT BEDTIME. (Patient taking differently: Place 1,000 mg rectally daily. ) 30 suppository 6  . gabapentin (NEURONTIN) 300 MG capsule TAKE ONE CAPSULE BY MOUTH 3 TIMES A DAY 270 capsule 1  . Probiotic Product (PROBIOTIC PO) Take by mouth.    . umeclidinium bromide (INCRUSE ELLIPTA) 62.5 MCG/INH AEPB Inhale 1 puff into the lungs daily. 1 each 5  . vitamin B-12 (CYANOCOBALAMIN) 500 MCG tablet Take 500 mcg by mouth daily.    Marland Kitchen sulfamethoxazole-trimethoprim (BACTRIM DS,SEPTRA DS) 800-160 MG tablet Take 1 tablet by mouth 2 (two) times daily. PT STARTED 05/10/09 - 14 DAY SUPPLY    . tapentadol (NUCYNTA) 50 MG tablet Take 100 mg by mouth every 4 (four) hours as needed.     No facility-administered medications prior to visit.     Allergies:  Allergies  Allergen Reactions  . Aspirin Shortness Of Breath    ASTHMATIC ATTACK  . Codeine Other (See Comments)    ABNORMAL BEHAVIOR  Increased in bad moods  NO CODEINE BASED MEDICATIONS   . Vicodin  [Hydrocodone-Acetaminophen] Other (See Comments)    Extreme anxiety      Review of Systems: See HPI for pertinent ROS. All other ROS negative.    Physical Exam: Blood pressure 130/74, pulse 64, temperature 97.9 F (36.6 C), temperature source Oral, resp. rate 14, height 5\' 2"  (1.575 m), weight 59.9 kg (132 lb), SpO2 98 %., Body mass index is 24.14 kg/m. General: WF.  Appears in no acute distress. Neck: Supple. No thyromegaly. No lymphadenopathy. Lungs: Clear bilaterally to auscultation without wheezes, rales, or rhonchi. Breathing is unlabored. Heart: Regular rhythm. No murmurs, rubs, or gallops. Msk:  Strength and tone normal for age. Skin:  Right Buttock is densely covered with multiple erythematous papules (each ~ 37mm diameter) --some of these are topped with white dot on top--when press these, dot of white drainage comes out. None of these have any abscess area at all. No area of underlying firmness.  Neuro: Alert and oriented X 3. Moves all extremities spontaneously. Gait is normal. CNII-XII grossly in tact. Psych:  Responds to questions appropriately with a normal affect.     ASSESSMENT AND PLAN:  56 y.o. year old female with  1. MRSA (methicillin resistant staph aureus) culture positive She is to go ahead and start the Bactrim now and take as directed. I will follow-up with her once I get culture results as well. In the interim if this worsens then go ahead and follow-up  immediately. As well, if these do not resolve upon completion of antibiotic, then follow-up also. - sulfamethoxazole-trimethoprim (BACTRIM DS,SEPTRA DS) 800-160 MG tablet; Take 1 tablet by mouth 2 (two) times daily.  Dispense: 20 tablet; Refill: 0 - CULTURE, ROUTINE-ABSCESS  2. Dermatitis  - sulfamethoxazole-trimethoprim (BACTRIM DS,SEPTRA DS) 800-160 MG tablet; Take 1 tablet by mouth 2 (two) times daily.  Dispense: 20 tablet; Refill: 0 - CULTURE, ROUTINE-ABSCESS   Signed, [address] [place], [place],  [facility] 06/15/2017 10:23 AM

## 2017-06-18 LAB — WOUND CULTURE
MICRO NUMBER:: 81104057
SPECIMEN QUALITY: ADEQUATE

## 2017-06-29 ENCOUNTER — Other Ambulatory Visit: Payer: Self-pay | Admitting: Family Medicine

## 2017-06-30 ENCOUNTER — Other Ambulatory Visit: Payer: Self-pay

## 2017-07-04 ENCOUNTER — Ambulatory Visit (INDEPENDENT_AMBULATORY_CARE_PROVIDER_SITE_OTHER): Payer: Medicare Other | Admitting: Family Medicine

## 2017-07-04 ENCOUNTER — Encounter: Payer: Self-pay | Admitting: Family Medicine

## 2017-07-04 VITALS — BP 132/80 | HR 74 | Temp 97.6°F | Resp 16 | Ht 62.0 in | Wt 130.0 lb

## 2017-07-04 DIAGNOSIS — Z22322 Carrier or suspected carrier of Methicillin resistant Staphylococcus aureus: Secondary | ICD-10-CM

## 2017-07-04 DIAGNOSIS — F419 Anxiety disorder, unspecified: Secondary | ICD-10-CM | POA: Diagnosis not present

## 2017-07-04 MED ORDER — ESCITALOPRAM OXALATE 10 MG PO TABS: 10.0000 mg | ORAL_TABLET | Freq: Every day | ORAL | 3 refills | Status: DC

## 2017-07-04 NOTE — Progress Notes (Signed)
Subjective:    Patient ID: [name], female    DOB: [birth year], 56 y.o.   MRN: [id number]  HPI Patient was recently seen by my partner and was diagnosed with MRSA folliculitis on her right gluteus.  She has since completed antibiotics and she wants to come in today to discuss treatment strategies to prevent this from occurring again.  Examination of her skin reveals hyperpigmented 4 mm macules showing residual scar but no evidence of acute infection.  There are no pustules or erythema or warmth or tenderness.  She reports a history of being colonized with MRSA for years.  I explained to the patient that there are no treatment strategies at the present time to reverse or cure patients colonized with MRSA.  There is no obvious active infection at the present time.  She was interested in using mupirocin every day but I explained to the patient that this would lead to antibiotic resistance and has not been shown to reduce the risk of MRSA infections in the future.  She is also concerned about anxiety.  Her daughter thinks that she needs to be on Xanax.  Patient has a history of driving while intoxicated and substance abuse.  I do not believe the controlled substances are in her best interest.  However she is dealing with daily anxiety and panic attacks.  She constantly feels anxious and overwhelmed.  She denies any depression or suicidal ideation.  She denies any symptoms of mania at the present time.  Instead she has constant feelings of stress and dread and constant worry that something is going to happen even though nothing is physically wrong Past Medical History:  Diagnosis Date  . Anxiety    BILATERAL HANDS-- LEFT >RIGHT AND LEGS  . Arthritis   . Asthma    NO INHALER  . Bed bug bite   . Blood transfusion without reported diagnosis   . CA - skin cancer   . Chronic pain due to injury RIGHT HAND/WRIST IN 2007  . Complication of anesthesia    unable to urinate after surgery  . COPD  (chronic obstructive pulmonary disease) (HCC)    Stage 1  . Crush injury of hand RIGHT IN 2007   X 6 SURG'S  LAST ONE BEING TOTAL WRIST FUSION W/ BONE GRAFT AND PLATE  . Cubital tunnel syndrome    Left elbow  . Depression   . ETOH abuse   . GERD (gastroesophageal reflux disease)    05/11/17- not recently  . Hemorrhoids   . History of kidney stones    passed  . MRSA (methicillin resistant Staphylococcus aureus) carrier   . Neuromuscular disorder (HCC)    some fibromyalgia  . Neuropathy   . PTSD (post-traumatic stress disorder) 2007 RIGHT HAND CRUSH INJURY W/ 2 FINGER AMPUTATION  . Stroke ([place]) X2 CVA'S  IN 1990   LOSS OF VISION RIGHT Peripherial bilteral  . Substance abuse ([place])    hx: narcotic use  . UC (ulcerative colitis) ([place])   . Weakness of right leg CAUSED BY RETRIEVAL THIGH MUSCLE FOR FLAP RIGHT HAND INJURY-- 2006   RIGHT LEG GIVES OUT OCCASIONALLY   Past Surgical History:  Procedure Laterality Date  . AMPUTATION Left 05/13/2017   Procedure: Left small finger revision amputation ;  Surgeon: Roseanne Kaufman, MD;  Location: [facility];  Service: Orthopedics;  Laterality: Left;  60 mins  . COLON SURGERY     15 years ago- fistula on colon removed  . COSMETIC SURGERY  right leg and abdomen  . DILATATION & CURETTAGE/HYSTEROSCOPY WITH MYOSURE N/A 06/14/2016   Procedure: [place];  Surgeon: Anastasio Auerbach, MD;  Location: [facility];  Service: Gynecology;  Laterality: N/A;  . HAND CARPECTOMY  12-24-2005   RIGHT-- INCLUDING  REMOVAL  WRIST WIRES AND I &D (EXICIOSNAL)  FOR OSTEOMYELITIS  . HYSTEROSCOPY  12.6.2012   w/removal of polyp  . HYSTEROSCOPY W/D&C  08/18/2011   Procedure: DILATATION AND CURETTAGE (D&C) /HYSTEROSCOPY;  Surgeon: Anastasio Auerbach, MD;  Location: [facility];  Service: Gynecology;  Laterality: N/A;  . INCISION / DRAINAGE HAND / FINGER  X3  (2006 & 2007)   RIGHT CRUSH WOUND  . LESION REMOVAL  08/18/2011    Procedure: EXCISION VAGINAL LESION;  Surgeon: Anastasio Auerbach, MD;  Location: [facility];  Service: Gynecology;  Laterality: N/A;  . ORIF CARPAL BONE FRACTURE  08-31-05   I & D OF RIGHT HAND CRUSH INJURY/ OPEN FX'S AND INDEX FINGER RAY RESECTION/ COMPLICATED CLOSURE  . ORIF RADIAL FRACTURE  09-04-05   DISTAL RADIAL PLATE AND PARTIAL THUMB AMPUTATION  . RIGHT TOTAL WRIST FUSION W/ BONE GRAFT AND PLATE  [id number]   AND FLAP USING RIGHT GOIN MUSCLE  . skin cancer removal  2007   throat area  . TUBAL LIGATION  15 YRS AGO   Current Outpatient Prescriptions on File Prior to Visit  Medication Sig Dispense Refill  . albuterol (PROVENTIL HFA;VENTOLIN HFA) 108 (90 Base) MCG/ACT inhaler Inhale 2 puffs into the lungs every 6 (six) hours as needed for wheezing or shortness of breath. 1 Inhaler 1  . CANASA 1000 MG suppository PLACE 1 SUPPOSITORY (1,000 MG TOTAL) RECTALLY AT BEDTIME. 30 suppository 6  . gabapentin (NEURONTIN) 300 MG capsule TAKE ONE CAPSULE BY MOUTH 3 TIMES A DAY 270 capsule 1  . Probiotic Product (PROBIOTIC PO) Take by mouth.    . umeclidinium bromide (INCRUSE ELLIPTA) 62.5 MCG/INH AEPB Inhale 1 puff into the lungs daily. 1 each 5  . vitamin B-12 (CYANOCOBALAMIN) 500 MCG tablet Take 500 mcg by mouth daily.     No current facility-administered medications on file prior to visit.    Allergies  Allergen Reactions  . Aspirin Shortness Of Breath    ASTHMATIC ATTACK  . Codeine Other (See Comments)    ABNORMAL BEHAVIOR  Increased in bad moods  NO CODEINE BASED MEDICATIONS   . Vicodin [Hydrocodone-Acetaminophen] Other (See Comments)    Extreme anxiety   Social History   Social History  . Marital status: Single    Spouse name: N/A  . Number of children: 6  . Years of education: N/A   Occupational History  . Housekeeper     Social History Main Topics  . Smoking status: Former Smoker    Packs/day: 1.50    Years: 28.00    Types: Cigarettes    Quit date:  11/29/2001  . Smokeless tobacco: Never Used  . Alcohol use 6.0 oz/week    10 Standard drinks or equivalent per week     Comment: 05/11/17- increased this week due to pain  . Drug use: Yes    Types: Marijuana  . Sexual activity: Yes    Partners: Male    Birth control/ protection: Post-menopausal     Comment: 1st intercourse 56 yo-More than 5 partners   Other Topics Concern  . Not on file   Social History Narrative  . No narrative on file  Review of Systems  All other systems reviewed and are negative.      Objective:   Physical Exam  Constitutional: She appears well-developed and well-nourished.  Cardiovascular: Normal rate, regular rhythm and normal heart sounds.   Pulmonary/Chest: Effort normal and breath sounds normal. No respiratory distress. She has no wheezes. She has no rales.  Skin: Rash noted.     Vitals reviewed.  Hyperpigmented macules shown in red on diagram but no active erythema of pustules.        Assessment & Plan:  MRSA (methicillin resistant staph aureus) culture positive  Anxiety  I explained to the patient that there is no way to cure MRSA colonization we did discuss strategies such as a once a week bath with Hibiclens to reduce counts on the skin versus a once a week bath in water with a capful of Clorox.  There is no evidence to support or refute any of these strategies.  I tried to reassure the patient.  However I would treat her anxiety with Lexapro 10 mg a day and reassess in 1 month

## 2017-07-10 ENCOUNTER — Other Ambulatory Visit: Payer: Self-pay | Admitting: Family Medicine

## 2017-07-10 MED ORDER — MESALAMINE 1000 MG RE SUPP: 1000.0000 mg | Freq: Every day | RECTAL | 6 refills | Status: DC

## 2017-07-21 ENCOUNTER — Encounter: Payer: Self-pay | Admitting: Family Medicine

## 2017-07-21 ENCOUNTER — Ambulatory Visit (INDEPENDENT_AMBULATORY_CARE_PROVIDER_SITE_OTHER): Payer: Medicare Other | Admitting: Family Medicine

## 2017-07-21 VITALS — BP 154/90 | HR 76 | Temp 97.7°F | Resp 16 | Ht 62.0 in | Wt 127.0 lb

## 2017-07-21 DIAGNOSIS — D485 Neoplasm of uncertain behavior of skin: Secondary | ICD-10-CM

## 2017-07-21 DIAGNOSIS — F411 Generalized anxiety disorder: Secondary | ICD-10-CM | POA: Diagnosis not present

## 2017-07-21 NOTE — Progress Notes (Signed)
Subjective:    Patient ID: [name], female    DOB: 1960/09/24, 56 y.o.   MRN: [id number]  HPI  07/04/17  Patient was recently seen by my partner and was diagnosed with MRSA folliculitis on her right gluteus.  She has since completed antibiotics and she wants to come in today to discuss treatment strategies to prevent this from occurring again.  Examination of her skin reveals hyperpigmented 4 mm macules showing residual scar but no evidence of acute infection.  There are no pustules or erythema or warmth or tenderness.  She reports a history of being colonized with MRSA for years.  I explained to the patient that there are no treatment strategies at the present time to reverse or cure patients colonized with MRSA.  There is no obvious active infection at the present time.  She was interested in using mupirocin every day but I explained to the patient that this would lead to antibiotic resistance and has not been shown to reduce the risk of MRSA infections in the future.  She is also concerned about anxiety.  Her daughter thinks that she needs to be on Xanax.  Patient has a history of driving while intoxicated and substance abuse.  I do not believe the controlled substances are in her best interest.  However she is dealing with daily anxiety and panic attacks.  She constantly feels anxious and overwhelmed.  She denies any depression or suicidal ideation.  She denies any symptoms of mania at the present time.  Instead she has constant feelings of stress and dread and constant worry that something is going to happen even though nothing is physically wrong.   At that time, my plan was: I explained to the patient that there is no way to cure MRSA colonization we did discuss strategies such as a once a week bath with Hibiclens to reduce counts on the skin versus a once a week bath in water with a capful of Clorox.  There is no evidence to support or refute any of these strategies.  I tried to reassure  the patient.  However I would treat her anxiety with Lexapro 10 mg a day and reassess in 1 month  07/21/17 Patient is requesting a referral to mental health.  She was unable to tolerate Lexapro due to decreased libido.  She refuses to try that again even though she only took it for a week.  She continues to have worsening anxiety attacks.  In the past she has self medicated first with oxycodone and later with alcohol.  As the patient herself states, she wants to find something that can replace the alcohol and help manage her anxiety.  The problem with that is its prone to addiction and I think Xanax or benzodiazepines are in extremely poor option for this patient given her previous history of substance abuse.  I spent more than 20 minutes with the patient explaining her that.  I believe we need to focus better on preventing the panic attacks than simply masking them with alcohol or opiates or benzodiazepines.  She denies any history of bipolar although I have some concern about bipolar tendencies given her history of substance abuse and rash decisions.  However her biggest concern now is uncontrolled anxiety and frequent panic attacks.  Alos has 2 mm scaly red lesion on left temple behind eye.   Past Medical History:  Diagnosis Date  . Anxiety    BILATERAL HANDS-- LEFT >RIGHT AND LEGS  . Arthritis   .  Asthma    NO INHALER  . Bed bug bite   . Blood transfusion without reported diagnosis   . CA - skin cancer   . Chronic pain due to injury RIGHT HAND/WRIST IN 2007  . Complication of anesthesia    unable to urinate after surgery  . COPD (chronic obstructive pulmonary disease) (HCC)    Stage 1  . Crush injury of hand RIGHT IN 2007   X 6 SURG'S  LAST ONE BEING TOTAL WRIST FUSION W/ BONE GRAFT AND PLATE  . Cubital tunnel syndrome    Left elbow  . Depression   . ETOH abuse   . GERD (gastroesophageal reflux disease)    05/11/17- not recently  . Hemorrhoids   . History of kidney stones    passed  .  MRSA (methicillin resistant Staphylococcus aureus) carrier   . Neuromuscular disorder (HCC)    some fibromyalgia  . Neuropathy   . PTSD (post-traumatic stress disorder) 2007 RIGHT HAND CRUSH INJURY W/ 2 FINGER AMPUTATION  . Stroke ([place]) X2 CVA'S  IN 1990   LOSS OF VISION RIGHT Peripherial bilteral  . Substance abuse ([place])    hx: narcotic use  . UC (ulcerative colitis) ([place])   . Weakness of right leg CAUSED BY RETRIEVAL THIGH MUSCLE FOR FLAP RIGHT HAND INJURY-- 2006   RIGHT LEG GIVES OUT OCCASIONALLY   Past Surgical History:  Procedure Laterality Date  . COLON SURGERY     15 years ago- fistula on colon removed  . COSMETIC SURGERY     right leg and abdomen  . HAND CARPECTOMY  12-24-2005   RIGHT-- INCLUDING  REMOVAL  WRIST WIRES AND I &D (EXICIOSNAL)  FOR OSTEOMYELITIS  . HYSTEROSCOPY  12.6.2012   w/removal of polyp  . INCISION / DRAINAGE HAND / FINGER  X3  (2006 & 2007)   RIGHT CRUSH WOUND  . ORIF CARPAL BONE FRACTURE  08-31-05   I & D OF RIGHT HAND CRUSH INJURY/ OPEN FX'S AND INDEX FINGER RAY RESECTION/ COMPLICATED CLOSURE  . ORIF RADIAL FRACTURE  09-04-05   DISTAL RADIAL PLATE AND PARTIAL THUMB AMPUTATION  . RIGHT TOTAL WRIST FUSION W/ BONE GRAFT AND PLATE  [id number]   AND FLAP USING RIGHT GOIN MUSCLE  . skin cancer removal  2007   throat area  . TUBAL LIGATION  15 YRS AGO   Current Outpatient Medications on File Prior to Visit  Medication Sig Dispense Refill  . albuterol (PROVENTIL HFA;VENTOLIN HFA) 108 (90 Base) MCG/ACT inhaler Inhale 2 puffs into the lungs every 6 (six) hours as needed for wheezing or shortness of breath. 1 Inhaler 1  . gabapentin (NEURONTIN) 300 MG capsule TAKE ONE CAPSULE BY MOUTH 3 TIMES A DAY 270 capsule 1  . mesalamine (CANASA) 1000 MG suppository Place 1 suppository (1,000 mg total) rectally at bedtime. 30 suppository 6  . Probiotic Product (PROBIOTIC PO) Take by mouth.    . umeclidinium bromide (INCRUSE ELLIPTA) 62.5 MCG/INH AEPB Inhale 1 puff into  the lungs daily. 1 each 5  . vitamin B-12 (CYANOCOBALAMIN) 500 MCG tablet Take 500 mcg by mouth daily.     No current facility-administered medications on file prior to visit.    Allergies  Allergen Reactions  . Aspirin Shortness Of Breath    ASTHMATIC ATTACK  . Codeine Other (See Comments)    ABNORMAL BEHAVIOR  Increased in bad moods  NO CODEINE BASED MEDICATIONS   . Vicodin [Hydrocodone-Acetaminophen] Other (See Comments)    Extreme anxiety  Social History   Socioeconomic History  . Marital status: Single    Spouse name: Not on file  . Number of children: 6  . Years of education: Not on file  . Highest education level: Not on file  Social Needs  . Financial resource strain: Not on file  . Food insecurity - worry: Not on file  . Food insecurity - inability: Not on file  . Transportation needs - medical: Not on file  . Transportation needs - non-medical: Not on file  Occupational History  . Occupation: Housekeeper   Tobacco Use  . Smoking status: Former Smoker    Packs/day: 1.50    Years: 28.00    Pack years: 42.00    Types: Cigarettes    Last attempt to quit: 11/29/2001    Years since quitting: 15.6  . Smokeless tobacco: Never Used  Substance and Sexual Activity  . Alcohol use: Yes    Alcohol/week: 6.0 oz    Types: 10 Standard drinks or equivalent per week    Comment: 05/11/17- increased this week due to pain  . Drug use: Yes    Types: Marijuana  . Sexual activity: Yes    Partners: Male    Birth control/protection: Post-menopausal    Comment: 1st intercourse 56 yo-More than 5 partners  Other Topics Concern  . Not on file  Social History Narrative  . Not on file      Review of Systems  All other systems reviewed and are negative.      Objective:   Physical Exam Vital signs were reviewed.  Physical exam was limited as more than 20 minutes was spent in discussion with the patient regarding her mental health      Assessment & Plan:  GAD  (generalized anxiety disorder) - Plan: Ambulatory referral to Psychiatry  Neoplasm of uncertain behavior of skin I treated the lesion on her left temple that was 2 mm in size with liquid nitrogen cryotherapy for 30 seconds.  It appears to be an actinic keratoses.  I will start the patient on Trintellix 5 mg poqday and increase to 10 mg poqday in 1 week.  Consult psychiatry as I have also a suspicion about undiagnosed bipolar disorder although she does not have symptoms of mania or a history of mania.  Consider adding lamictal if trintellix is ineffective.

## 2017-07-25 ENCOUNTER — Other Ambulatory Visit: Payer: Self-pay | Admitting: Family Medicine

## 2017-08-16 ENCOUNTER — Telehealth: Payer: Self-pay | Admitting: Family Medicine

## 2017-08-16 ENCOUNTER — Other Ambulatory Visit: Payer: Self-pay

## 2017-08-16 ENCOUNTER — Ambulatory Visit (INDEPENDENT_AMBULATORY_CARE_PROVIDER_SITE_OTHER): Payer: Medicare Other | Admitting: Physician Assistant

## 2017-08-16 ENCOUNTER — Ambulatory Visit: Payer: Medicare Other

## 2017-08-16 ENCOUNTER — Encounter: Payer: Self-pay | Admitting: Physician Assistant

## 2017-08-16 VITALS — BP 128/74 | HR 58 | Temp 97.8°F | Resp 16 | Wt 128.8 lb

## 2017-08-16 DIAGNOSIS — Z22322 Carrier or suspected carrier of Methicillin resistant Staphylococcus aureus: Secondary | ICD-10-CM | POA: Diagnosis not present

## 2017-08-16 DIAGNOSIS — S61212A Laceration without foreign body of right middle finger without damage to nail, initial encounter: Secondary | ICD-10-CM | POA: Diagnosis not present

## 2017-08-16 MED ORDER — SULFAMETHOXAZOLE-TRIMETHOPRIM 800-160 MG PO TABS: 1.0000 | ORAL_TABLET | Freq: Two times a day (BID) | ORAL | 0 refills | Status: DC

## 2017-08-16 MED ORDER — VORTIOXETINE HBR 10 MG PO TABS: 10.0000 mg | ORAL_TABLET | Freq: Every day | ORAL | 5 refills | Status: DC

## 2017-08-16 NOTE — Telephone Encounter (Signed)
Patient is calling to get an rx for her trintelliix she had samples given to her from dr pickard [facility]

## 2017-08-16 NOTE — Telephone Encounter (Signed)
Medication called/sent to requested pharmacy  

## 2017-08-16 NOTE — Progress Notes (Signed)
Patient ID: [name] MRN: [id number], DOB: 28-Apr-1961, 56 y.o. Date of Encounter: 08/16/2017, 11:59 AM    Chief Complaint:  Chief Complaint  Patient presents with  . cut on right finger     HPI: 56 y.o. year old female presents with above.   She reports this happened last night at around 11pm. Cut on right 5ht finger--hit it on grill of fire pit. She washed it good and applied betadine.      Home Meds:   Outpatient Medications Prior to Visit  Medication Sig Dispense Refill  . albuterol (PROVENTIL HFA;VENTOLIN HFA) 108 (90 Base) MCG/ACT inhaler Inhale 2 puffs into the lungs every 6 (six) hours as needed for wheezing or shortness of breath. 1 Inhaler 1  . gabapentin (NEURONTIN) 300 MG capsule TAKE ONE CAPSULE BY MOUTH 3 TIMES A DAY 270 capsule 2  . INCRUSE ELLIPTA 62.5 MCG/INH AEPB TAKE 1 PUFF BY MOUTH EVERY DAY 30 each 5  . mesalamine (CANASA) 1000 MG suppository Place 1 suppository (1,000 mg total) rectally at bedtime. 30 suppository 6  . Probiotic Product (PROBIOTIC PO) Take by mouth.    . vitamin B-12 (CYANOCOBALAMIN) 500 MCG tablet Take 500 mcg by mouth daily.     No facility-administered medications prior to visit.     Allergies:  Allergies  Allergen Reactions  . Aspirin Shortness Of Breath    ASTHMATIC ATTACK  . Codeine Other (See Comments)    ABNORMAL BEHAVIOR  Increased in bad moods  NO CODEINE BASED MEDICATIONS   . Vicodin [Hydrocodone-Acetaminophen] Other (See Comments)    Extreme anxiety      Review of Systems: See HPI for pertinent ROS. All other ROS negative.    Physical Exam: Blood pressure 128/74, pulse (!) 58, temperature 97.8 F (36.6 C), temperature source Oral, resp. rate 16, weight 58.4 kg (128 lb 12.8 oz), SpO2 98 %., Body mass index is 23.56 kg/m. General:  WF. Appears in no acute distress. Neck: Supple. No thyromegaly. No lymphadenopathy. Lungs: Clear bilaterally to auscultation without wheezes, rales, or rhonchi. Breathing is  unlabored. Heart: Regular rhythm. No murmurs, rubs, or gallops. Msk:  Strength and tone normal for age. Extremities/Skin: Right Hand: Thumb and index finger are already missing from this hand. 3rd finger: Plantar surface: At level of PIP joint--there is superficial laceration. 0.5 cm x 110mm.  Neuro: Alert and oriented X 3. Moves all extremities spontaneously. Gait is normal. CNII-XII grossly in tact. Psych:  Responds to questions appropriately with a normal affect.     ASSESSMENT AND PLAN:  56 y.o. year old female with  1. Laceration of right middle finger without foreign body without damage to nail, initial encounter Site cleansed. Steristrips applied with good closure, approximation of wound. Finger splint applied to keep finger still, prevent movement at this joint. She is to keep site clean and dry for 1 week. Then can remove steristrips but cont to use finger splint to prevent site from re-opening.  She has h/o MRSA and is concerned she will develop MRSA at this site so will cover with Bactrim.  Tdap up-to-date--given 08/20/2013. F/U PRN - sulfamethoxazole-trimethoprim (BACTRIM DS,SEPTRA DS) 800-160 MG tablet; Take 1 tablet by mouth 2 (two) times daily.  Dispense: 14 tablet; Refill: 0  2. MRSA (methicillin resistant staph aureus) culture positive - sulfamethoxazole-trimethoprim (BACTRIM DS,SEPTRA DS) 800-160 MG tablet; Take 1 tablet by mouth 2 (two) times daily.  Dispense: 14 tablet; Refill: 0   Signed, [address] [place], [place], [facility] 08/16/2017 11:59  AM

## 2017-08-18 ENCOUNTER — Other Ambulatory Visit: Payer: Self-pay | Admitting: Family Medicine

## 2017-08-18 MED ORDER — UMECLIDINIUM BROMIDE 62.5 MCG/INH IN AEPB: 1.0000 | INHALATION_SPRAY | Freq: Every day | RESPIRATORY_TRACT | 3 refills | Status: DC

## 2017-08-18 NOTE — Telephone Encounter (Signed)
Med sent to pharm for 90 day supply - incruse

## 2017-09-15 ENCOUNTER — Encounter: Payer: Self-pay | Admitting: Family Medicine

## 2017-09-15 ENCOUNTER — Ambulatory Visit (INDEPENDENT_AMBULATORY_CARE_PROVIDER_SITE_OTHER): Payer: Medicare Other | Admitting: Family Medicine

## 2017-09-15 VITALS — BP 120/80 | HR 66 | Temp 98.1°F | Resp 16 | Ht 62.0 in | Wt 126.0 lb

## 2017-09-15 DIAGNOSIS — F411 Generalized anxiety disorder: Secondary | ICD-10-CM

## 2017-09-15 DIAGNOSIS — D485 Neoplasm of uncertain behavior of skin: Secondary | ICD-10-CM | POA: Diagnosis not present

## 2017-09-15 DIAGNOSIS — D045 Carcinoma in situ of skin of trunk: Secondary | ICD-10-CM | POA: Diagnosis not present

## 2017-09-15 MED ORDER — CARBAMAZEPINE 200 MG PO TABS
100.0000 mg | ORAL_TABLET | Freq: Two times a day (BID) | ORAL | 3 refills | Status: DC
Start: 2017-09-15 — End: 2017-12-28

## 2017-09-15 NOTE — Progress Notes (Signed)
Subjective:     Patient ID: [name], female   DOB: 07-Apr-1961, 57 y.o.   MRN: [id number]  HPI Please see the patient's last office visit, since starting Trintellix, the patient reports feeling "amped up "" whenever she takes the medication.  5 mg seem to help her depression but 10 mg gives her too much energy and makes her feel jittery and anxious and aggressive.  As I mentioned in her previous office visits, I have concern about possible underlying bipolar disorder.  She is not taking a mood stabilizer.  Psychiatry has not yet contacted her for follow-up appointment.  She denies any overt mania.  She denies days without sleeping.  She denies delusions or hallucinations.  However she does report frequent mood swings, aggressive behavior, flight of ideas, and occasional pressured speech.  She also reports impulsive behavior.  She would self medicate with alcohol in the past.  She also has a concerning lesion on her chest.  There is a tattoo on her upper left chest.  Immediately medial to the tattoo is a 1 cm erythematous papular lesion with iridescent features and telangiectasias concerning for possible basal cell.  She is requesting a biopsy of this today which I believe is appropriate Past Medical History:  Diagnosis Date  . Anxiety    BILATERAL HANDS-- LEFT >RIGHT AND LEGS  . Arthritis   . Asthma    NO INHALER  . Bed bug bite   . Blood transfusion without reported diagnosis   . CA - skin cancer   . Chronic pain due to injury RIGHT HAND/WRIST IN 2007  . Complication of anesthesia    unable to urinate after surgery  . COPD (chronic obstructive pulmonary disease) (HCC)    Stage 1  . Crush injury of hand RIGHT IN 2007   X 6 SURG'S  LAST ONE BEING TOTAL WRIST FUSION W/ BONE GRAFT AND PLATE  . Cubital tunnel syndrome    Left elbow  . Depression   . ETOH abuse   . GERD (gastroesophageal reflux disease)    05/11/17- not recently  . Hemorrhoids   . History of kidney stones    passed    . MRSA (methicillin resistant Staphylococcus aureus) carrier   . Neuromuscular disorder (HCC)    some fibromyalgia  . Neuropathy   . PTSD (post-traumatic stress disorder) 2007 RIGHT HAND CRUSH INJURY W/ 2 FINGER AMPUTATION  . Stroke ([place]) X2 CVA'S  IN 1990   LOSS OF VISION RIGHT Peripherial bilteral  . Substance abuse ([place])    hx: narcotic use  . UC (ulcerative colitis) ([place])   . Weakness of right leg CAUSED BY RETRIEVAL THIGH MUSCLE FOR FLAP RIGHT HAND INJURY-- 2006   RIGHT LEG GIVES OUT OCCASIONALLY   Past Surgical History:  Procedure Laterality Date  . AMPUTATION Left 05/13/2017   Procedure: Left small finger revision amputation ;  Surgeon: Roseanne Kaufman, MD;  Location: [facility];  Service: Orthopedics;  Laterality: Left;  60 mins  . COLON SURGERY     15 years ago- fistula on colon removed  . COSMETIC SURGERY     right leg and abdomen  . DILATATION & CURETTAGE/HYSTEROSCOPY WITH MYOSURE N/A 06/14/2016   Procedure: [place];  Surgeon: Anastasio Auerbach, MD;  Location: [facility];  Service: Gynecology;  Laterality: N/A;  . HAND CARPECTOMY  12-24-2005   RIGHT-- INCLUDING  REMOVAL  WRIST WIRES AND I &D (EXICIOSNAL)  FOR OSTEOMYELITIS  . HYSTEROSCOPY  12.6.2012  w/removal of polyp  . HYSTEROSCOPY W/D&C  08/18/2011   Procedure: DILATATION AND CURETTAGE (D&C) /HYSTEROSCOPY;  Surgeon: Anastasio Auerbach, MD;  Location: [facility];  Service: Gynecology;  Laterality: N/A;  . INCISION / DRAINAGE HAND / FINGER  X3  (2006 & 2007)   RIGHT CRUSH WOUND  . LESION REMOVAL  08/18/2011   Procedure: EXCISION VAGINAL LESION;  Surgeon: Anastasio Auerbach, MD;  Location: [facility];  Service: Gynecology;  Laterality: N/A;  . ORIF CARPAL BONE FRACTURE  08-31-05   I & D OF RIGHT HAND CRUSH INJURY/ OPEN FX'S AND INDEX FINGER RAY RESECTION/ COMPLICATED CLOSURE  . ORIF RADIAL FRACTURE  09-04-05   DISTAL RADIAL PLATE AND PARTIAL THUMB  AMPUTATION  . RIGHT TOTAL WRIST FUSION W/ BONE GRAFT AND PLATE  [id number]   AND FLAP USING RIGHT GOIN MUSCLE  . skin cancer removal  2007   throat area  . TUBAL LIGATION  15 YRS AGO   Current Outpatient Medications on File Prior to Visit  Medication Sig Dispense Refill  . albuterol (PROVENTIL HFA;VENTOLIN HFA) 108 (90 Base) MCG/ACT inhaler Inhale 2 puffs into the lungs every 6 (six) hours as needed for wheezing or shortness of breath. 1 Inhaler 1  . gabapentin (NEURONTIN) 300 MG capsule TAKE ONE CAPSULE BY MOUTH 3 TIMES A DAY 270 capsule 2  . mesalamine (CANASA) 1000 MG suppository Place 1 suppository (1,000 mg total) rectally at bedtime. 30 suppository 6  . umeclidinium bromide (INCRUSE ELLIPTA) 62.5 MCG/INH AEPB Inhale 1 puff into the lungs daily. 90 each 3  . vitamin B-12 (CYANOCOBALAMIN) 500 MCG tablet Take 500 mcg by mouth daily.    Marland Kitchen vortioxetine HBr (TRINTELLIX) 10 MG TABS Take 1 tablet (10 mg total) by mouth daily. 30 tablet 5   No current facility-administered medications on file prior to visit.    Allergies  Allergen Reactions  . Aspirin Shortness Of Breath    ASTHMATIC ATTACK  . Codeine Other (See Comments)    ABNORMAL BEHAVIOR  Increased in bad moods  NO CODEINE BASED MEDICATIONS   . Vicodin [Hydrocodone-Acetaminophen] Other (See Comments)    Extreme anxiety   Social History   Socioeconomic History  . Marital status: Single    Spouse name: Not on file  . Number of children: 6  . Years of education: Not on file  . Highest education level: Not on file  Social Needs  . Financial resource strain: Not on file  . Food insecurity - worry: Not on file  . Food insecurity - inability: Not on file  . Transportation needs - medical: Not on file  . Transportation needs - non-medical: Not on file  Occupational History  . Occupation: Housekeeper   Tobacco Use  . Smoking status: Former Smoker    Packs/day: 1.50    Years: 28.00    Pack years: 42.00    Types: Cigarettes     Last attempt to quit: 11/29/2001    Years since quitting: 15.8  . Smokeless tobacco: Never Used  Substance and Sexual Activity  . Alcohol use: Yes    Alcohol/week: 6.0 oz    Types: 10 Standard drinks or equivalent per week    Comment: 05/11/17- increased this week due to pain  . Drug use: Yes    Types: Marijuana  . Sexual activity: Yes    Partners: Male    Birth control/protection: Post-menopausal    Comment: 1st intercourse 56 yo-More than 5 partners  Other Topics Concern  .  Not on file  Social History Narrative  . Not on file     Review of Systems  All other systems reviewed and are negative.      Objective:   Physical Exam  Cardiovascular: Normal rate, regular rhythm and normal heart sounds.  Pulmonary/Chest: Effort normal and breath sounds normal. No respiratory distress. She has no wheezes. She has no rales.    Psychiatric: She has a normal mood and affect. Her behavior is normal. Judgment and thought content normal.  Vitals reviewed.      Assessment:     Neoplasm of uncertain behavior of skin of chest - Plan: Pathology      Plan:     Lesion on the skin was anesthetized with 0.1% lidocaine with epinephrine.  Using sterile technique, shave biopsy was performed of the entire lesion and it was sent to pathology in a labeled container.  Hemostasis was obtained Drysol and a Band-Aid.  Patient tolerated the procedure well without complication.  Await pathology results.  Regarding her depression and possible bipolar tendencies, I want the patient to see a psychiatrist.  Meanwhile decrease Trintellix 5 mg a day and add Tegretol 100 mg p.o. twice daily as a possible mood stabilizer and reassess the patient in 1 month if she has not seen psychiatry before then

## 2017-09-20 ENCOUNTER — Encounter (HOSPITAL_COMMUNITY): Payer: Self-pay | Admitting: Emergency Medicine

## 2017-09-20 ENCOUNTER — Ambulatory Visit (HOSPITAL_COMMUNITY)
Admission: EM | Admit: 2017-09-20 | Discharge: 2017-09-20 | Disposition: A | Discharge status: Home / Self Care | Payer: Medicare Other | Attending: Internal Medicine | Admitting: Internal Medicine

## 2017-09-20 DIAGNOSIS — F4310 Post-traumatic stress disorder, unspecified: Secondary | ICD-10-CM | POA: Diagnosis not present

## 2017-09-20 DIAGNOSIS — Z79899 Other long term (current) drug therapy: Secondary | ICD-10-CM | POA: Insufficient documentation

## 2017-09-20 DIAGNOSIS — R300 Dysuria: Secondary | ICD-10-CM | POA: Diagnosis not present

## 2017-09-20 DIAGNOSIS — K219 Gastro-esophageal reflux disease without esophagitis: Secondary | ICD-10-CM | POA: Insufficient documentation

## 2017-09-20 DIAGNOSIS — J449 Chronic obstructive pulmonary disease, unspecified: Secondary | ICD-10-CM | POA: Diagnosis not present

## 2017-09-20 DIAGNOSIS — A4902 Methicillin resistant Staphylococcus aureus infection, unspecified site: Secondary | ICD-10-CM

## 2017-09-20 DIAGNOSIS — N7689 Other specified inflammation of vagina and vulva: Secondary | ICD-10-CM

## 2017-09-20 DIAGNOSIS — N3090 Cystitis, unspecified without hematuria: Secondary | ICD-10-CM

## 2017-09-20 DIAGNOSIS — F329 Major depressive disorder, single episode, unspecified: Secondary | ICD-10-CM | POA: Diagnosis not present

## 2017-09-20 DIAGNOSIS — G629 Polyneuropathy, unspecified: Secondary | ICD-10-CM | POA: Diagnosis not present

## 2017-09-20 DIAGNOSIS — K5190 Ulcerative colitis, unspecified, without complications: Secondary | ICD-10-CM | POA: Insufficient documentation

## 2017-09-20 DIAGNOSIS — Z886 Allergy status to analgesic agent status: Secondary | ICD-10-CM | POA: Insufficient documentation

## 2017-09-20 DIAGNOSIS — Z8673 Personal history of transient ischemic attack (TIA), and cerebral infarction without residual deficits: Secondary | ICD-10-CM | POA: Diagnosis not present

## 2017-09-20 LAB — POCT URINALYSIS DIP (DEVICE)
BILIRUBIN URINE: NEGATIVE
GLUCOSE, UA: 100 mg/dL — AB
KETONES UR: NEGATIVE mg/dL
Nitrite: POSITIVE — AB
PROTEIN: 30 mg/dL — AB
SPECIFIC GRAVITY, URINE: 1.01 (ref 1.005–1.030)
Urobilinogen, UA: 1 mg/dL (ref 0.0–1.0)
pH: 6.5 (ref 5.0–8.0)

## 2017-09-20 MED ORDER — SULFAMETHOXAZOLE-TRIMETHOPRIM 800-160 MG PO TABS: 1.0000 | ORAL_TABLET | Freq: Two times a day (BID) | ORAL | 0 refills | Status: DC

## 2017-09-20 NOTE — ED Triage Notes (Signed)
PT C/O: UTI sx onset yest associated dysuria, urinary freq/urgency  Also reports vaginal abscess she noticed yest  DENIES: fevers, abd pain   TAKING MEDS: Pyridium   A&O x4... NAD... Ambulatory

## 2017-09-20 NOTE — ED Provider Notes (Addendum)
MRN: [id number] DOB: 10-05-1960  Subjective:   [name] is a 57 y.o. female presenting for 1 day history of dysuria, urinary frequency and urinary urgency. Also reports history of MRSA infection. Reports a "deep" boil is developing over her right vagina/inner thigh. Denies fever, hematuria, flank pain, abdominal pain, pelvic pain and vaginal discharge, nausea and vomiting, rashes. Denies smoking cigarettes.   No current facility-administered medications for this encounter.   Current Outpatient Medications:  .  carbamazepine (TEGRETOL) 200 MG tablet, Take 0.5 tablets (100 mg total) by mouth 2 (two) times daily., Disp: 60 tablet, Rfl: 3 .  gabapentin (NEURONTIN) 300 MG capsule, TAKE ONE CAPSULE BY MOUTH 3 TIMES A DAY, Disp: 270 capsule, Rfl: 2 .  vitamin B-12 (CYANOCOBALAMIN) 500 MCG tablet, Take 500 mcg by mouth daily., Disp: , Rfl:  .  vortioxetine HBr (TRINTELLIX) 10 MG TABS, Take 1 tablet (10 mg total) by mouth daily., Disp: 30 tablet, Rfl: 5 .  albuterol (PROVENTIL HFA;VENTOLIN HFA) 108 (90 Base) MCG/ACT inhaler, Inhale 2 puffs into the lungs every 6 (six) hours as needed for wheezing or shortness of breath., Disp: 1 Inhaler, Rfl: 1 .  mesalamine (CANASA) 1000 MG suppository, Place 1 suppository (1,000 mg total) rectally at bedtime., Disp: 30 suppository, Rfl: 6 .  umeclidinium bromide (INCRUSE ELLIPTA) 62.5 MCG/INH AEPB, Inhale 1 puff into the lungs daily., Disp: 90 each, Rfl: 3   [name] is allergic to aspirin; codeine; and vicodin [hydrocodone-acetaminophen]. [name]  has a past medical history of Anxiety, Arthritis, Asthma, Bed bug bite, Blood transfusion without reported diagnosis, CA - skin cancer, Chronic pain due to injury (RIGHT HAND/WRIST IN 0932), Complication of anesthesia, COPD (chronic obstructive pulmonary disease) ([place]), Crush injury of hand (RIGHT IN 2007), Cubital tunnel syndrome, Depression, ETOH abuse, GERD (gastroesophageal reflux disease), Hemorrhoids, History of  kidney stones, MRSA (methicillin resistant Staphylococcus aureus) carrier, Neuromuscular disorder ([place]), Neuropathy, PTSD (post-traumatic stress disorder) (2007 RIGHT HAND CRUSH INJURY W/ 2 FINGER AMPUTATION), Stroke ([place]) (X2 CVA'S  IN 1990), Substance abuse ([place]), UC (ulcerative colitis) ([place]), and Weakness of right leg (CAUSED BY RETRIEVAL [place] RIGHT HAND INJURY-- 2006). Also  has a past surgical history that includes skin cancer removal (2007); RIGHT TOTAL WRIST FUSION W/ BONE GRAFT AND PLATE ([id number]); Hand Carpectomy (12-24-2005); ORIF radial fracture (09-04-05); Incision / drainage hand / finger (X3  (2006 & 2007)); ORIF carpal bone fracture (08-31-05); Tubal ligation (15 YRS AGO); Hysteroscopy w/D&C (08/18/2011); Lesion removal (08/18/2011); Hysteroscopy (12.6.2012); Colon surgery; Cosmetic surgery; Dilatation & curettage/hysteroscopy with myosure (N/A, 06/14/2016); and Amputation (Left, 05/13/2017). Her family history includes Breast cancer (age of onset: 30) in her maternal aunt; Breast cancer (age of onset: 34) in her maternal grandmother; Lupus in her mother; Stomach cancer in her paternal grandfather.   Objective:   Vitals: BP 135/73 (BP Location: Left Arm)   Pulse (!) 53   Temp 98 F (36.7 C) (Oral)   Resp 16   SpO2 97%   Physical Exam  Constitutional: She is oriented to person, place, and time. She appears well-developed and well-nourished.  HENT:  Mouth/Throat: Oropharynx is clear and moist.  Eyes: No scleral icterus.  Cardiovascular: Normal rate, regular rhythm and intact distal pulses. Exam reveals no gallop and no friction rub.  No murmur heard. Pulmonary/Chest: No respiratory distress. She has no wheezes. She has no rales.  Abdominal: Soft. Bowel sounds are normal. She exhibits no distension and no mass. There is no tenderness. There is no guarding.  No  CVA tenderness.  Genitourinary:  Genitourinary Comments: Patient refused pelvic exam, stated, "I know what  this is and I just need my medication."  Musculoskeletal:  Right hand thumb, index finger amputation noted.  Neurological: She is alert and oriented to person, place, and time.  Skin: Skin is warm and dry.  Psychiatric: She has a normal mood and affect.   Results for orders placed or performed during the hospital encounter of 09/20/17 (from the past 24 hour(s))  POCT urinalysis dip (device)     Status: Abnormal   Collection Time: 09/20/17 10:54 AM  Result Value Ref Range   Glucose, UA 100 (A) NEGATIVE mg/dL   Bilirubin Urine NEGATIVE NEGATIVE   Ketones, ur NEGATIVE NEGATIVE mg/dL   Specific Gravity, Urine 1.010 1.005 - 1.030   Hgb urine dipstick MODERATE (A) NEGATIVE   pH 6.5 5.0 - 8.0   Protein, ur 30 (A) NEGATIVE mg/dL   Urobilinogen, UA 1.0 0.0 - 1.0 mg/dL   Nitrite POSITIVE (A) NEGATIVE   Leukocytes, UA LARGE (A) NEGATIVE   Assessment and Plan :   Cystitis  Dysuria  Vagina boil  MRSA infection   Start Bactrim, urine culture is pending. Counseled on signs of worsening infection including abscess. Return-to-clinic precautions discussed, patient verbalized understanding.      Jaynee Eagles, PA-C 09/20/17 1136

## 2017-09-21 LAB — PATHOLOGY

## 2017-09-21 LAB — TISSUE SPECIMEN

## 2017-09-22 ENCOUNTER — Encounter: Payer: Self-pay | Admitting: Family Medicine

## 2017-09-22 LAB — URINE CULTURE

## 2017-09-25 ENCOUNTER — Other Ambulatory Visit: Payer: Self-pay | Admitting: Family Medicine

## 2017-09-25 DIAGNOSIS — D045 Carcinoma in situ of skin of trunk: Secondary | ICD-10-CM

## 2017-09-29 ENCOUNTER — Ambulatory Visit (INDEPENDENT_AMBULATORY_CARE_PROVIDER_SITE_OTHER): Payer: Medicare Other | Admitting: Family Medicine

## 2017-09-29 ENCOUNTER — Encounter: Payer: Self-pay | Admitting: Family Medicine

## 2017-09-29 VITALS — BP 130/74 | HR 74 | Temp 98.0°F | Resp 14 | Ht 62.0 in | Wt 126.0 lb

## 2017-09-29 DIAGNOSIS — Z23 Encounter for immunization: Secondary | ICD-10-CM

## 2017-09-29 DIAGNOSIS — K623 Rectal prolapse: Secondary | ICD-10-CM

## 2017-09-29 DIAGNOSIS — Z1231 Encounter for screening mammogram for malignant neoplasm of breast: Secondary | ICD-10-CM

## 2017-09-29 DIAGNOSIS — Z0000 Encounter for general adult medical examination without abnormal findings: Secondary | ICD-10-CM

## 2017-09-29 DIAGNOSIS — Z1239 Encounter for other screening for malignant neoplasm of breast: Secondary | ICD-10-CM

## 2017-09-29 NOTE — Progress Notes (Signed)
Subjective:    Patient ID: [name], female    DOB: 09-21-1960, 57 y.o.   MRN: [id number]  HPI Patient is a 57 year old white female here for complete physical exam. Last mammogram was greater than one year ago in December 2017. She is due for this. Pap smears performed by her gynecologist in 2018 and is up-to-date. Last colonoscopy was in 2014. Biopsy revealed no inflammatory bowel disease or colonoscopy was consistent with mild rectal prolapse. She is here today complaining of symptoms of rectal prolapse. She reports a mass like sensation around the rectum. She reports a constant urge or feeling as if she needs to defecate.  She denies any constipation. She denies any blood in her stool. However she is using Canasa almost on a daily basis due to discomfort. I explained to the patient that that is not good long-term. She is interested in talking with the surgeon about possible surgical options. On exam today, she does have a mild rectal prolapse. There is no perirectal mass.   Past Medical History:  Diagnosis Date  . Anxiety    BILATERAL HANDS-- LEFT >RIGHT AND LEGS  . Arthritis   . Asthma    NO INHALER  . Bed bug bite   . Blood transfusion without reported diagnosis   . CA - skin cancer   . Chronic pain due to injury RIGHT HAND/WRIST IN 2007  . Complication of anesthesia    unable to urinate after surgery  . COPD (chronic obstructive pulmonary disease) (HCC)    Stage 1  . Crush injury of hand RIGHT IN 2007   X 6 SURG'S  LAST ONE BEING TOTAL WRIST FUSION W/ BONE GRAFT AND PLATE  . Cubital tunnel syndrome    Left elbow  . Depression   . ETOH abuse   . GERD (gastroesophageal reflux disease)    05/11/17- not recently  . Hemorrhoids   . History of kidney stones    passed  . MRSA (methicillin resistant Staphylococcus aureus) carrier   . Neuromuscular disorder (HCC)    some fibromyalgia  . Neuropathy   . PTSD (post-traumatic stress disorder) 2007 RIGHT HAND CRUSH INJURY W/ 2  FINGER AMPUTATION  . Stroke ([place]) X2 CVA'S  IN 1990   LOSS OF VISION RIGHT Peripherial bilteral  . Substance abuse ([place])    hx: narcotic use  . UC (ulcerative colitis) ([place])   . Weakness of right leg CAUSED BY RETRIEVAL THIGH MUSCLE FOR FLAP RIGHT HAND INJURY-- 2006   RIGHT LEG GIVES OUT OCCASIONALLY   Past Surgical History:  Procedure Laterality Date  . AMPUTATION Left 05/13/2017   Procedure: Left small finger revision amputation ;  Surgeon: Roseanne Kaufman, MD;  Location: [facility];  Service: Orthopedics;  Laterality: Left;  60 mins  . COLON SURGERY     15 years ago- fistula on colon removed  . COSMETIC SURGERY     right leg and abdomen  . DILATATION & CURETTAGE/HYSTEROSCOPY WITH MYOSURE N/A 06/14/2016   Procedure: [place];  Surgeon: Anastasio Auerbach, MD;  Location: [facility];  Service: Gynecology;  Laterality: N/A;  . HAND CARPECTOMY  12-24-2005   RIGHT-- INCLUDING  REMOVAL  WRIST WIRES AND I &D (EXICIOSNAL)  FOR OSTEOMYELITIS  . HYSTEROSCOPY  12.6.2012   w/removal of polyp  . HYSTEROSCOPY W/D&C  08/18/2011   Procedure: DILATATION AND CURETTAGE (D&C) /HYSTEROSCOPY;  Surgeon: Anastasio Auerbach, MD;  Location: [facility];  Service: Gynecology;  Laterality: N/A;  .  INCISION / DRAINAGE HAND / FINGER  X3  (2006 & 2007)   RIGHT CRUSH WOUND  . LESION REMOVAL  08/18/2011   Procedure: EXCISION VAGINAL LESION;  Surgeon: Anastasio Auerbach, MD;  Location: [facility];  Service: Gynecology;  Laterality: N/A;  . ORIF CARPAL BONE FRACTURE  08-31-05   I & D OF RIGHT HAND CRUSH INJURY/ OPEN FX'S AND INDEX FINGER RAY RESECTION/ COMPLICATED CLOSURE  . ORIF RADIAL FRACTURE  09-04-05   DISTAL RADIAL PLATE AND PARTIAL THUMB AMPUTATION  . RIGHT TOTAL WRIST FUSION W/ BONE GRAFT AND PLATE  08-81-1031   AND FLAP USING RIGHT GOIN MUSCLE  . skin cancer removal  2007   throat area  . TUBAL LIGATION  15 YRS AGO   Current Outpatient  Medications on File Prior to Visit  Medication Sig Dispense Refill  . albuterol (PROVENTIL HFA;VENTOLIN HFA) 108 (90 Base) MCG/ACT inhaler Inhale 2 puffs into the lungs every 6 (six) hours as needed for wheezing or shortness of breath. 1 Inhaler 1  . carbamazepine (TEGRETOL) 200 MG tablet Take 0.5 tablets (100 mg total) by mouth 2 (two) times daily. 60 tablet 3  . gabapentin (NEURONTIN) 300 MG capsule TAKE ONE CAPSULE BY MOUTH 3 TIMES A DAY 270 capsule 2  . mesalamine (CANASA) 1000 MG suppository Place 1 suppository (1,000 mg total) rectally at bedtime. 30 suppository 6  . umeclidinium bromide (INCRUSE ELLIPTA) 62.5 MCG/INH AEPB Inhale 1 puff into the lungs daily. 90 each 3  . vitamin B-12 (CYANOCOBALAMIN) 500 MCG tablet Take 500 mcg by mouth daily.    Marland Kitchen vortioxetine HBr (TRINTELLIX) 10 MG TABS Take 1 tablet (10 mg total) by mouth daily. 30 tablet 5   No current facility-administered medications on file prior to visit.    Allergies  Allergen Reactions  . Aspirin Shortness Of Breath    ASTHMATIC ATTACK  . Codeine Other (See Comments)    ABNORMAL BEHAVIOR  Increased in bad moods  NO CODEINE BASED MEDICATIONS   . Vicodin [Hydrocodone-Acetaminophen] Other (See Comments)    Extreme anxiety   Social History   Socioeconomic History  . Marital status: Single    Spouse name: Not on file  . Number of children: 6  . Years of education: Not on file  . Highest education level: Not on file  Social Needs  . Financial resource strain: Not on file  . Food insecurity - worry: Not on file  . Food insecurity - inability: Not on file  . Transportation needs - medical: Not on file  . Transportation needs - non-medical: Not on file  Occupational History  . Occupation: Housekeeper   Tobacco Use  . Smoking status: Former Smoker    Packs/day: 1.50    Years: 28.00    Pack years: 42.00    Types: Cigarettes    Last attempt to quit: 11/29/2001    Years since quitting: 15.8  . Smokeless tobacco:  Never Used  Substance and Sexual Activity  . Alcohol use: Yes    Alcohol/week: 6.0 oz    Types: 10 Standard drinks or equivalent per week    Comment: 05/11/17- increased this week due to pain  . Drug use: Yes    Types: Marijuana  . Sexual activity: Yes    Partners: Male    Birth control/protection: Post-menopausal    Comment: 1st intercourse 57 yo-More than 5 partners  Other Topics Concern  . Not on file  Social History Narrative  . Not on file  Review of Systems  All other systems reviewed and are negative.      Objective:   Physical Exam  Constitutional: She is oriented to person, place, and time. She appears well-developed and well-nourished. No distress.  HENT:  Head: Normocephalic and atraumatic.  Right Ear: External ear normal.  Left Ear: External ear normal.  Nose: Nose normal.  Mouth/Throat: Oropharynx is clear and moist. No oropharyngeal exudate.  Eyes: Conjunctivae and EOM are normal. Pupils are equal, round, and reactive to light. Right eye exhibits no discharge. Left eye exhibits no discharge. No scleral icterus.  Neck: Normal range of motion. Neck supple. No JVD present. No tracheal deviation present. No thyromegaly present.  Cardiovascular: Normal rate, regular rhythm, normal heart sounds and intact distal pulses. Exam reveals no gallop and no friction rub.  No murmur heard. Pulmonary/Chest: Effort normal and breath sounds normal. No stridor. No respiratory distress. She has no wheezes. She has no rales. She exhibits no tenderness.  Abdominal: Soft. Bowel sounds are normal. She exhibits no distension and no mass. There is no tenderness. There is no rebound and no guarding.  Musculoskeletal: She exhibits deformity.  Lymphadenopathy:    She has no cervical adenopathy.  Neurological: She is alert and oriented to person, place, and time. She has normal reflexes. No cranial nerve deficit. She exhibits normal muscle tone. Coordination normal.  Skin: No rash  noted. She is not diaphoretic. No erythema.  Psychiatric: She has a normal mood and affect. Her behavior is normal. Judgment and thought content normal.  Vitals reviewed.  patient has a mild rectal mucosal prolapse on rectal exam. She is missing the thumb and index finger on right hand from industrial accident.          Assessment & Plan:  General medical exam  Rectal prolapse - Plan: Ambulatory referral to General Surgery  Breast cancer screening - Plan: MM Digital Screening  Patient continues to smoke marijuana daily so I gave her the pneumonia vaccine today. I will schedule her for mammogram. Pap smear is up-to-date. Colonoscopy is up-to-date. She does have a mild rectal prolapse on exam and the constant urge to defecate which possibly is coming from the rectal prolapse. I will consult general surgery to see if they have any other options for symptomatically control. I explained to the patient that her prolapse is mild and I do not believe that she is a surgical candidate for correction however she would like a second opinion. Return fasting for a CBC, CMP, fasting lipid panel.

## 2017-09-29 NOTE — Addendum Note (Signed)
Addended by: Shary Decamp B on: 09/29/2017 10:44 AM   Modules accepted: Orders

## 2017-10-02 ENCOUNTER — Other Ambulatory Visit: Payer: Self-pay | Admitting: Family Medicine

## 2017-10-02 DIAGNOSIS — Z1231 Encounter for screening mammogram for malignant neoplasm of breast: Secondary | ICD-10-CM

## 2017-10-05 ENCOUNTER — Other Ambulatory Visit: Payer: Medicare Other

## 2017-10-05 DIAGNOSIS — Z0000 Encounter for general adult medical examination without abnormal findings: Secondary | ICD-10-CM | POA: Diagnosis not present

## 2017-10-06 LAB — COMPLETE METABOLIC PANEL WITH GFR
AG RATIO: 1.7 (calc) (ref 1.0–2.5)
ALT: 11 U/L (ref 6–29)
AST: 18 U/L (ref 10–35)
Albumin: 3.9 g/dL (ref 3.6–5.1)
Alkaline phosphatase (APISO): 52 U/L (ref 33–130)
BILIRUBIN TOTAL: 0.2 mg/dL (ref 0.2–1.2)
BUN: 14 mg/dL (ref 7–25)
CALCIUM: 9.6 mg/dL (ref 8.6–10.4)
CHLORIDE: 106 mmol/L (ref 98–110)
CO2: 29 mmol/L (ref 20–32)
Creat: 0.73 mg/dL (ref 0.50–1.05)
GFR, EST NON AFRICAN AMERICAN: 91 mL/min/{1.73_m2} (ref >=60)
GFR, Est African American: 105 mL/min/{1.73_m2} (ref >=60)
GLUCOSE: 83 mg/dL (ref 65–99)
Globulin: 2.3 g/dL (calc) (ref 1.9–3.7)
POTASSIUM: 4.6 mmol/L (ref 3.5–5.3)
Sodium: 141 mmol/L (ref 135–146)
TOTAL PROTEIN: 6.2 g/dL (ref 6.1–8.1)

## 2017-10-06 LAB — CBC WITH DIFFERENTIAL/PLATELET
BASOS PCT: 0.8 %
Basophils Absolute: 63 cells/uL (ref 0–200)
EOS PCT: 3.5 %
Eosinophils Absolute: 277 cells/uL (ref 15–500)
HCT: 37.4 % (ref 35.0–45.0)
Hemoglobin: 12.5 g/dL (ref 11.7–15.5)
Lymphs Abs: 2346 cells/uL (ref 850–3900)
MCH: 30.8 pg (ref 27.0–33.0)
MCHC: 33.4 g/dL (ref 32.0–36.0)
MCV: 92.1 fL (ref 80.0–100.0)
MONOS PCT: 10.6 %
MPV: 10.2 fL (ref 7.5–12.5)
Neutro Abs: 4377 cells/uL (ref 1500–7800)
Neutrophils Relative %: 55.4 %
PLATELETS: 341 10*3/uL (ref 140–400)
RBC: 4.06 10*6/uL (ref 3.80–5.10)
RDW: 12 % (ref 11.0–15.0)
Total Lymphocyte: 29.7 %
WBC mixed population: 837 cells/uL (ref 200–950)
WBC: 7.9 10*3/uL (ref 3.8–10.8)

## 2017-10-06 LAB — LIPID PANEL
Cholesterol: 183 mg/dL (ref <200)
HDL: 43 mg/dL — AB (ref >50)
LDL CHOLESTEROL (CALC): 110 mg/dL — AB
Non-HDL Cholesterol (Calc): 140 mg/dL (calc) — ABNORMAL HIGH (ref <130)
Total CHOL/HDL Ratio: 4.3 (calc) (ref <5.0)
Triglycerides: 182 mg/dL — ABNORMAL HIGH (ref <150)

## 2017-10-09 ENCOUNTER — Encounter: Payer: Self-pay | Admitting: Family Medicine

## 2017-10-20 ENCOUNTER — Ambulatory Visit: Payer: Self-pay

## 2017-10-24 DIAGNOSIS — K641 Second degree hemorrhoids: Secondary | ICD-10-CM | POA: Diagnosis not present

## 2017-10-26 ENCOUNTER — Ambulatory Visit
Admission: RE | Admit: 2017-10-26 | Discharge: 2017-10-26 | Disposition: A | Discharge status: Home / Self Care | Payer: Medicare Other | Source: Ambulatory Visit | Attending: Family Medicine | Admitting: Family Medicine

## 2017-10-26 DIAGNOSIS — Z1231 Encounter for screening mammogram for malignant neoplasm of breast: Secondary | ICD-10-CM | POA: Diagnosis not present

## 2017-10-27 DIAGNOSIS — Z79899 Other long term (current) drug therapy: Secondary | ICD-10-CM | POA: Diagnosis not present

## 2017-10-30 DIAGNOSIS — D045 Carcinoma in situ of skin of trunk: Secondary | ICD-10-CM | POA: Diagnosis not present

## 2017-10-30 DIAGNOSIS — C44529 Squamous cell carcinoma of skin of other part of trunk: Secondary | ICD-10-CM | POA: Diagnosis not present

## 2017-11-13 DIAGNOSIS — K641 Second degree hemorrhoids: Secondary | ICD-10-CM | POA: Diagnosis not present

## 2017-11-22 ENCOUNTER — Other Ambulatory Visit: Payer: Self-pay | Admitting: Family Medicine

## 2017-11-22 MED ORDER — UMECLIDINIUM BROMIDE 62.5 MCG/INH IN AEPB: 1.0000 | INHALATION_SPRAY | Freq: Every day | RESPIRATORY_TRACT | 3 refills | Status: DC

## 2017-12-05 IMAGING — DX DG LUMBAR SPINE COMPLETE 4+V
5 series · 5 of 5 positions shown · non-contrast
Comparison: Thoracic spine radiographs from the same day

CLINICAL DATA: Patient fell backwards last evening. Pain across
lower back. Unable to bend over.

EXAM:
LUMBAR SPINE - COMPLETE 4+ VIEW

[t lumbar spine ap]
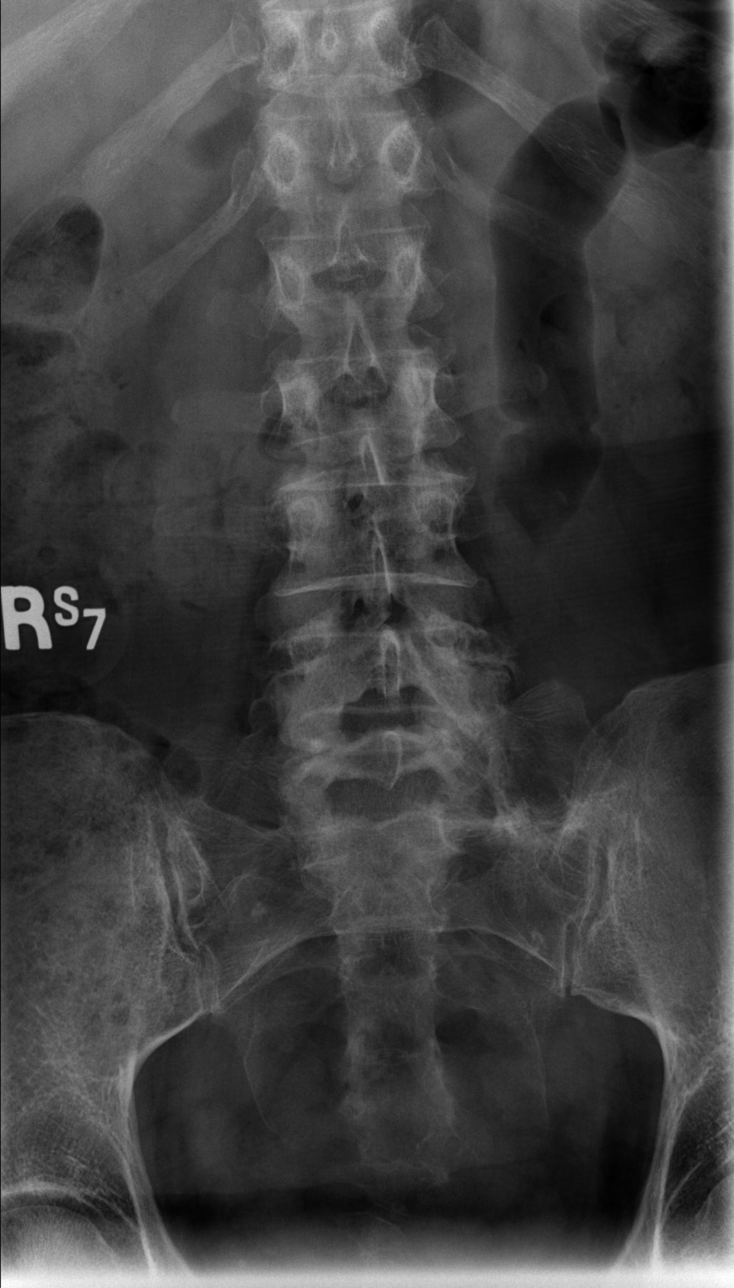

[t lumbar spine obl (1 of 2)]
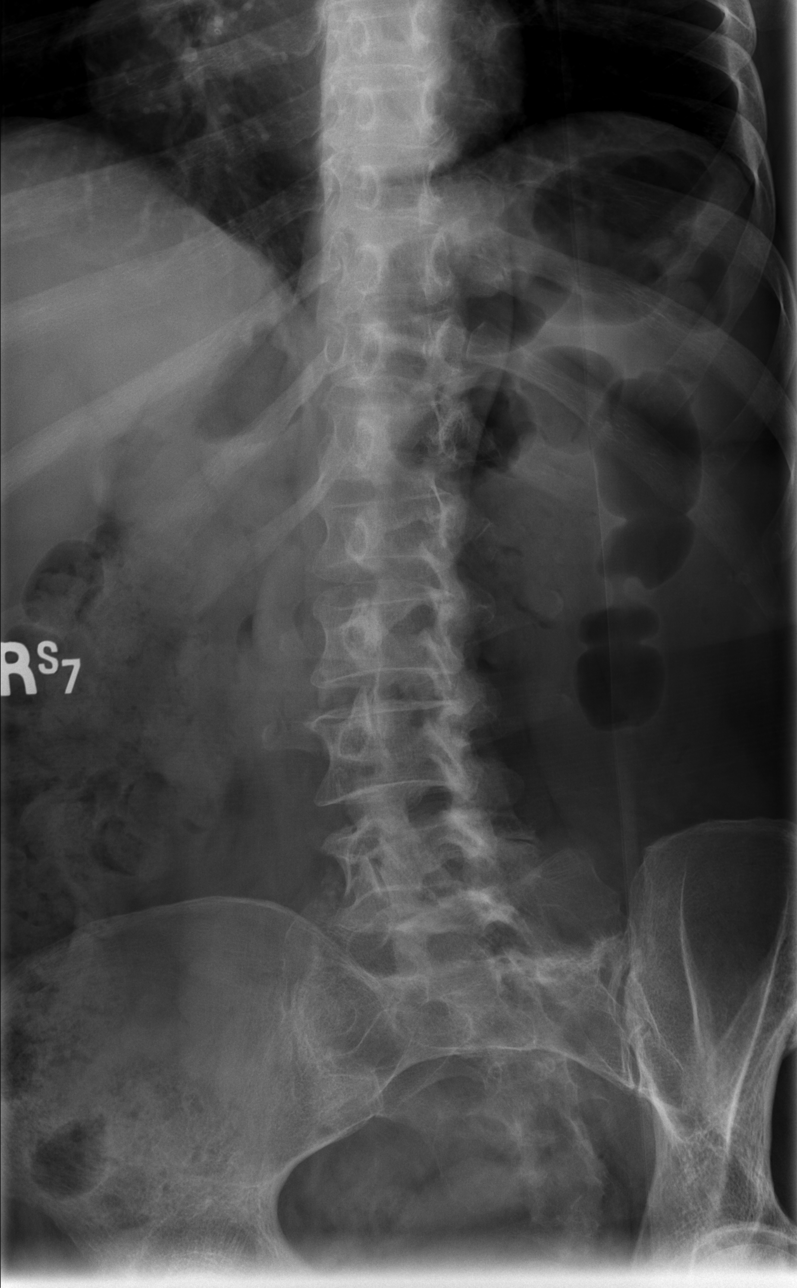

[t lumbar spine obl (2 of 2)]
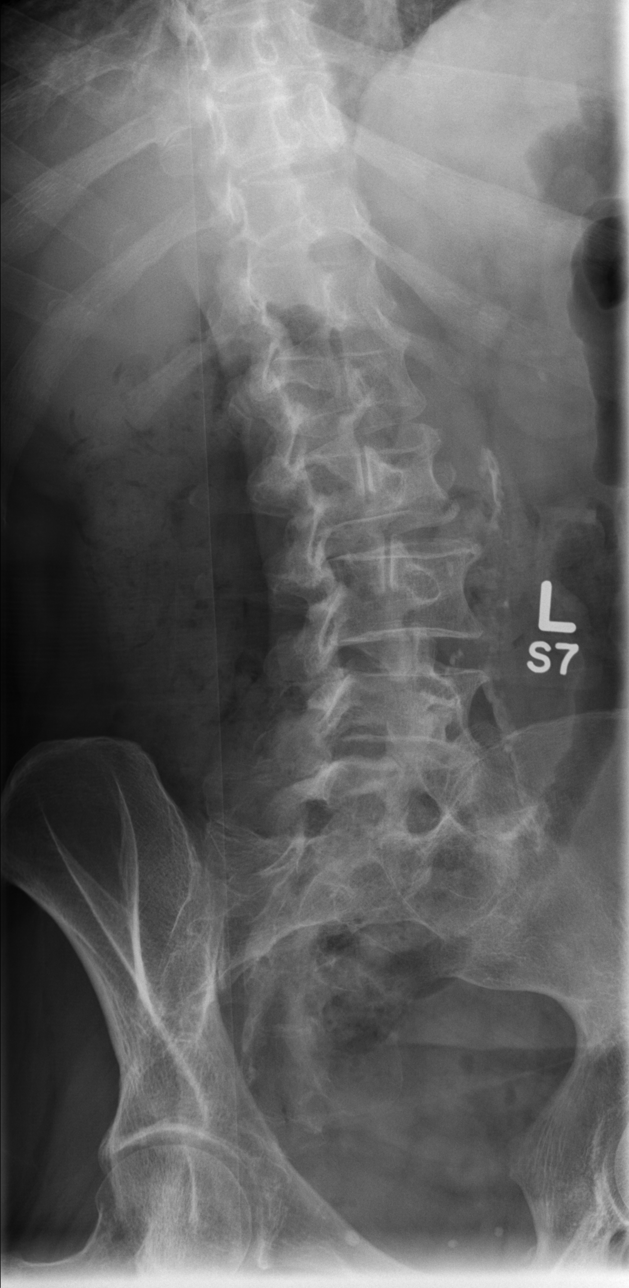

[t lumbar spine lat]
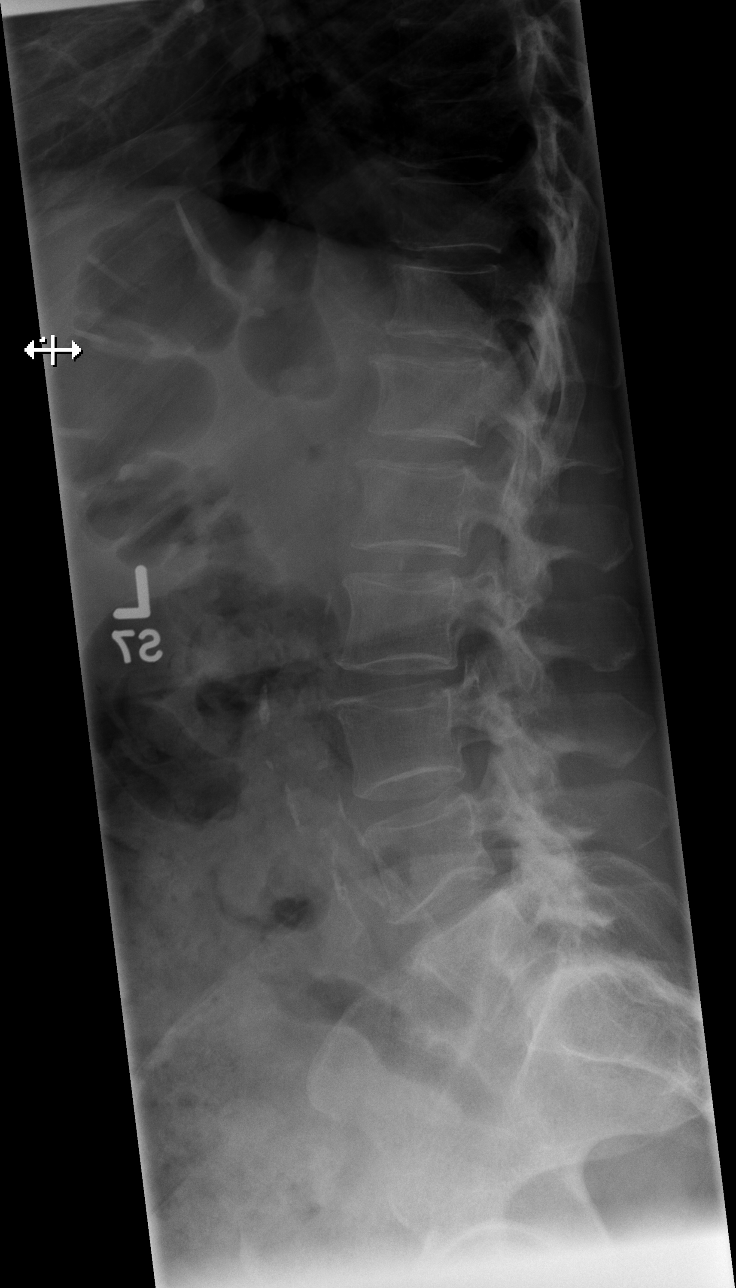

[t lumbar l-5 s-1 spot]
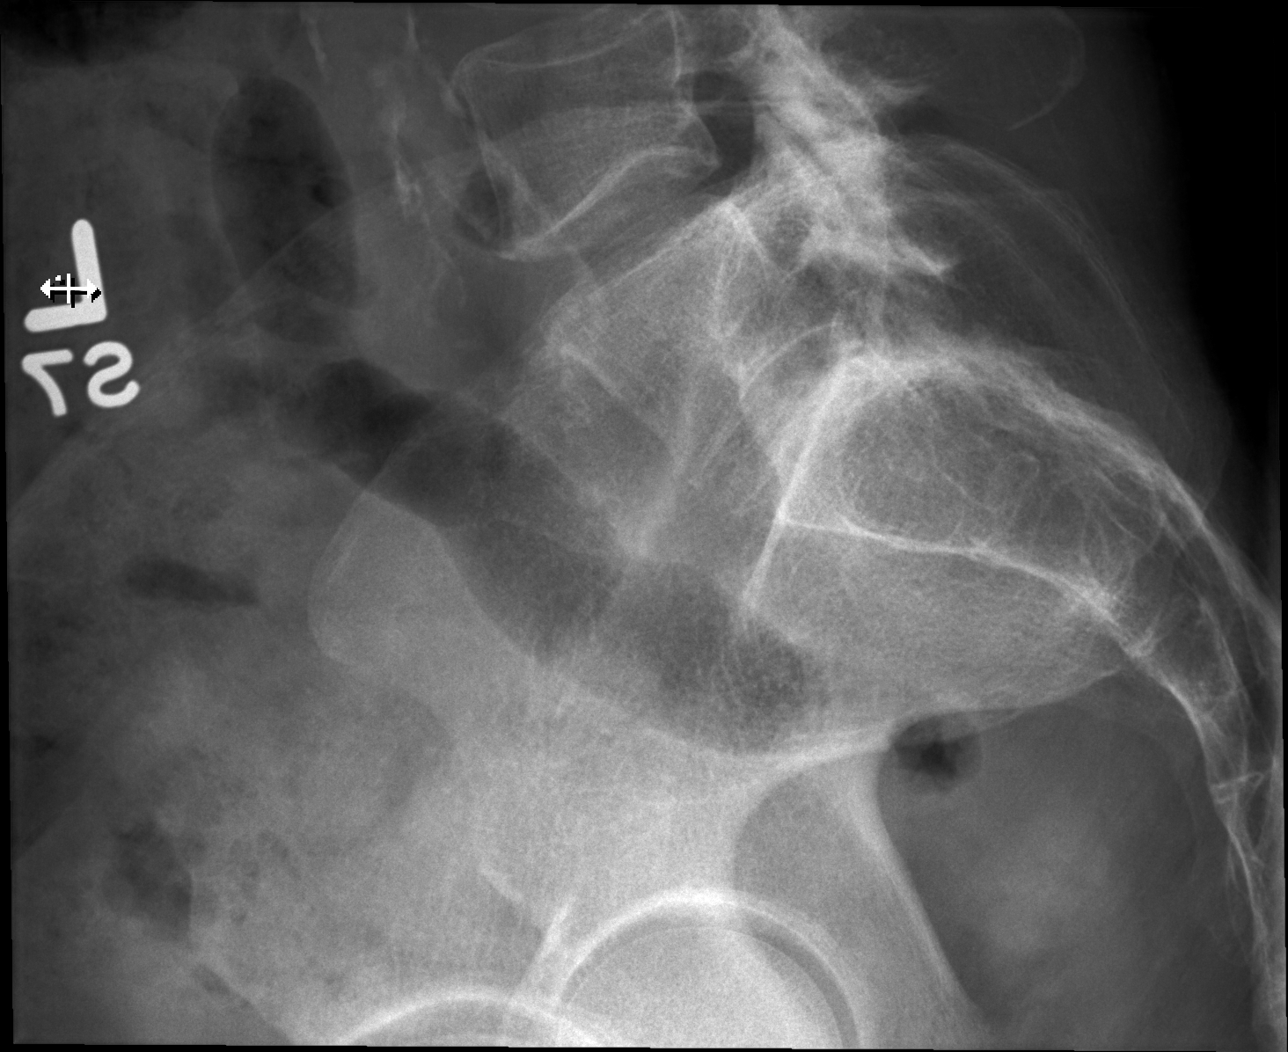

[5 of 5 positions shown; findings below may reference images not displayed]

FINDINGS: There appear to be rudimentary ribs associated with what will be
labeled L1 for numbering purposes. The twelfth ribs appear to be
long ribs on the thoracic spine radiographs. There is no evidence of
lumbar spine fracture. No significant disc space narrowing.
Pseudoarticulation of L5 with S1 bilaterally involving the
transverse processes. There is lumbar facet arthropathy from L3
through S1.
IMPRESSION: No acute osseous abnormality.  Lumbar facet arthropathy.

## 2017-12-19 DIAGNOSIS — H524 Presbyopia: Secondary | ICD-10-CM | POA: Diagnosis not present

## 2017-12-19 DIAGNOSIS — H52222 Regular astigmatism, left eye: Secondary | ICD-10-CM | POA: Diagnosis not present

## 2017-12-19 DIAGNOSIS — H53461 Homonymous bilateral field defects, right side: Secondary | ICD-10-CM | POA: Diagnosis not present

## 2017-12-19 DIAGNOSIS — H52211 Irregular astigmatism, right eye: Secondary | ICD-10-CM | POA: Diagnosis not present

## 2017-12-19 DIAGNOSIS — H5213 Myopia, bilateral: Secondary | ICD-10-CM | POA: Diagnosis not present

## 2017-12-20 ENCOUNTER — Other Ambulatory Visit: Payer: Self-pay

## 2017-12-20 ENCOUNTER — Ambulatory Visit (INDEPENDENT_AMBULATORY_CARE_PROVIDER_SITE_OTHER): Payer: Medicare Other | Admitting: Physician Assistant

## 2017-12-20 ENCOUNTER — Encounter: Payer: Self-pay | Admitting: Physician Assistant

## 2017-12-20 VITALS — BP 116/74 | HR 53 | Temp 98.0°F | Resp 14 | Ht 62.0 in | Wt 119.2 lb

## 2017-12-20 DIAGNOSIS — L02419 Cutaneous abscess of limb, unspecified: Secondary | ICD-10-CM

## 2017-12-20 DIAGNOSIS — Z22322 Carrier or suspected carrier of Methicillin resistant Staphylococcus aureus: Secondary | ICD-10-CM

## 2017-12-20 DIAGNOSIS — L03119 Cellulitis of unspecified part of limb: Secondary | ICD-10-CM

## 2017-12-20 MED ORDER — SULFAMETHOXAZOLE-TRIMETHOPRIM 800-160 MG PO TABS: 1.0000 | ORAL_TABLET | Freq: Two times a day (BID) | ORAL | 0 refills | Status: DC

## 2017-12-20 NOTE — Progress Notes (Addendum)
Patient ID: [name] MRN: [id number], DOB: 08-16-1961, 57 y.o. Date of Encounter: 12/20/2017, 11:52 AM    Chief Complaint:  Chief Complaint  Patient presents with  . lelsion on back of right leg    used the bathroom outside      HPI: 57 y.o. year old female presents with above.   Reviewed that she has history of known MRSA on the back of the right buttock region. She states that because of the location she cannot see this area to see what it looks like but feels some type of bump on the back of her upper right thigh.  Has noticed it for a couple of days.  Feels like it is getting more sore.    Home Meds:   Outpatient Medications Prior to Visit  Medication Sig Dispense Refill  . albuterol (PROVENTIL HFA;VENTOLIN HFA) 108 (90 Base) MCG/ACT inhaler Inhale 2 puffs into the lungs every 6 (six) hours as needed for wheezing or shortness of breath. 1 Inhaler 1  . BIOTIN PO Take by mouth.    . gabapentin (NEURONTIN) 300 MG capsule TAKE ONE CAPSULE BY MOUTH 3 TIMES A DAY 270 capsule 2  . mesalamine (CANASA) 1000 MG suppository Place 1 suppository (1,000 mg total) rectally at bedtime. 30 suppository 6  . umeclidinium bromide (INCRUSE ELLIPTA) 62.5 MCG/INH AEPB Inhale 1 puff into the lungs daily. 90 each 3  . vitamin B-12 (CYANOCOBALAMIN) 500 MCG tablet Take 500 mcg by mouth daily.    . carbamazepine (TEGRETOL) 200 MG tablet Take 0.5 tablets (100 mg total) by mouth 2 (two) times daily. (Patient not taking: Reported on 12/20/2017) 60 tablet 3  . vortioxetine HBr (TRINTELLIX) 10 MG TABS Take 1 tablet (10 mg total) by mouth daily. (Patient not taking: Reported on 12/20/2017) 30 tablet 5   No facility-administered medications prior to visit.     Allergies:  Allergies  Allergen Reactions  . Aspirin Shortness Of Breath    ASTHMATIC ATTACK  . Codeine Other (See Comments)    ABNORMAL BEHAVIOR  Increased in bad moods  NO CODEINE BASED MEDICATIONS   . Vicodin  [Hydrocodone-Acetaminophen] Other (See Comments)    Extreme anxiety      Review of Systems: See HPI for pertinent ROS. All other ROS negative.    Physical Exam: Blood pressure 116/74, pulse (!) 53, temperature 98 F (36.7 C), temperature source Oral, resp. rate 14, height 5' 2"  (1.575 m), weight 54.1 kg (119 lb 3.2 oz), SpO2 98 %., Body mass index is 21.8 kg/m. General:  WNWD WF. Appears in no acute distress. Neck: Supple. No thyromegaly. No lymphadenopathy. Lungs: Clear bilaterally to auscultation without wheezes, rales, or rhonchi. Breathing is unlabored. Heart: Regular rhythm. No murmurs, rubs, or gallops. Msk:  Strength and tone normal for age. Extremities/Skin: Posterior aspect of right upper thigh: There is 1 cm diameter area of erythema and in the center of this is a tiny 80m white papule.  I have palpated the site and there is no firmness -- no abscess to drain. Neuro: Alert and oriented X 3. Moves all extremities spontaneously. Gait is normal. CNII-XII grossly in tact. Psych:  Responds to questions appropriately with a normal affect.     ASSESSMENT AND PLAN:  57y.o. year old female with  1. Cellulitis and abscess of leg Exam finding is consistent with recurrent MRSA.  Discussed with patient that this is infection and can spread.  Have discussed proper hygiene to prevent spreading infection. She is  to take first dose of antibiotic immediately and take as directed.  She is to follow-up if site worsens despite the antibiotic or if site does not completely resolve upon completion of antibiotic.  She voices understanding and agrees. - sulfamethoxazole-trimethoprim (BACTRIM DS,SEPTRA DS) 800-160 MG tablet; Take 1 tablet by mouth 2 (two) times daily.  Dispense: 20 tablet; Refill: 0  2. MRSA (methicillin resistant staph aureus) culture positive Exam finding is consistent with recurrent MRSA.  Discussed with patient that this is infection and can spread.  Have discussed proper hygiene  to prevent spreading infection. She is to take first dose of antibiotic immediately and take as directed.  She is to follow-up if site worsens despite the antibiotic or if site does not completely resolve upon completion of antibiotic.  She voices understanding and agrees.  - sulfamethoxazole-trimethoprim (BACTRIM DS,SEPTRA DS) 800-160 MG tablet; Take 1 tablet by mouth 2 (two) times daily.  Dispense: 20 tablet; Refill: 0   Signed, [address] [place], [place], [facility] 12/20/2017 11:52 AM

## 2017-12-28 ENCOUNTER — Ambulatory Visit (INDEPENDENT_AMBULATORY_CARE_PROVIDER_SITE_OTHER): Payer: Medicare Other | Admitting: Family Medicine

## 2017-12-28 ENCOUNTER — Ambulatory Visit: Payer: Medicare Other | Admitting: Family Medicine

## 2017-12-28 ENCOUNTER — Encounter: Payer: Self-pay | Admitting: Family Medicine

## 2017-12-28 ENCOUNTER — Other Ambulatory Visit: Payer: Self-pay

## 2017-12-28 VITALS — BP 118/62 | HR 60 | Temp 98.0°F | Resp 14 | Ht 62.0 in | Wt 118.0 lb

## 2017-12-28 DIAGNOSIS — L03119 Cellulitis of unspecified part of limb: Secondary | ICD-10-CM | POA: Diagnosis not present

## 2017-12-28 DIAGNOSIS — A4902 Methicillin resistant Staphylococcus aureus infection, unspecified site: Secondary | ICD-10-CM | POA: Diagnosis not present

## 2017-12-28 DIAGNOSIS — L02419 Cutaneous abscess of limb, unspecified: Secondary | ICD-10-CM

## 2017-12-28 MED ORDER — CHLORHEXIDINE GLUCONATE 4 % EX LIQD: Freq: Every day | CUTANEOUS | 0 refills | Status: DC | PRN

## 2017-12-28 MED ORDER — SULFAMETHOXAZOLE-TRIMETHOPRIM 800-160 MG PO TABS
1.0000 | ORAL_TABLET | Freq: Two times a day (BID) | ORAL | 0 refills | Status: DC
Start: 2017-12-28 — End: 2018-01-25

## 2017-12-28 NOTE — Patient Instructions (Addendum)
Referral to Infectious disease Given 1 more week of bactrim

## 2017-12-28 NOTE — Progress Notes (Signed)
   Subjective:    Patient ID: [name], female    DOB: March 30, 1961, 57 y.o.   MRN: [id number]  Patient presents for Abscess to R Leg (states that she has open area draing pus like substance on the back of upper leg- has received ABTx from [place]- has used Bactroban Cream to nares)  Pt here with continued wound on right post thigh, given bactrim on 4/10, it has drained some pus past few days. Has history of multiple MRSA infections, has lost digits on hand due to infections. No fever, no chills  She used the bactroban to nares this week as well  Wound on leg is slowly improving  She initially thought she may have been bitten by something   Review Of Systems:  GEN- denies fatigue, fever, weight loss,weakness, recent illness HEENT- denies eye drainage, change in vision, nasal discharge, CVS- denies chest pain, palpitations RESP- denies SOB, cough, wheeze ABD- denies N/V, change in stools, abd pain GU- denies dysuria, hematuria, dribbling, incontinence MSK- denies joint pain, muscle aches, injury Neuro- denies headache, dizziness, syncope, seizure activity       Objective:    BP 118/62   Pulse 60   Temp 98 F (36.7 C) (Oral)   Resp 14   Ht 5\' 2"  (1.575 m)   Wt 118 lb (53.5 kg)   SpO2 97%   BMI 21.58 kg/m  GEN- NAD, alert and oriented x3 Skin- Right post upper leg, quarter size area of erythema, abscess in center little drainage, mild TTP  Procedure- Incision and Drainage Procedure explained to patient questions answered benefits and risks discussed verbalconsent obtained. Antiseptic-Betadine Anesthesia-lidocaine 1% with epi Incision performed small amount of pus expressed and some cystic material Minimal blood loss Patient tolerated procedure well Bandage applied, not deep enough for packing         Assessment & Plan:      Problem List Items Addressed This Visit    None    Visit Diagnoses    MRSA infection    -  Primary   recurrent abscess with MRSA, extend  Bactrim another week,this appears to have a cystic component as well. Send to ID see if anything else can be done. Also given Hibiclens wash She will call if this does not heal up by next week    Relevant Medications   sulfamethoxazole-trimethoprim (BACTRIM DS,SEPTRA DS) 800-160 MG tablet   Other Relevant Orders   Ambulatory referral to Infectious Disease   Cellulitis and abscess of leg       Relevant Medications   sulfamethoxazole-trimethoprim (BACTRIM DS,SEPTRA DS) 800-160 MG tablet      Note: This dictation was prepared with Dragon dictation along with smaller phrase technology. Any transcriptional errors that result from this process are unintentional.

## 2017-12-30 ENCOUNTER — Encounter: Payer: Self-pay | Admitting: Family Medicine

## 2018-01-11 ENCOUNTER — Ambulatory Visit: Payer: Medicare Other | Admitting: Internal Medicine

## 2018-01-11 ENCOUNTER — Encounter: Payer: Self-pay | Admitting: Internal Medicine

## 2018-01-11 VITALS — BP 117/68 | HR 53 | Temp 98.2°F | Ht 62.0 in | Wt 117.0 lb

## 2018-01-11 DIAGNOSIS — Z22322 Carrier or suspected carrier of Methicillin resistant Staphylococcus aureus: Secondary | ICD-10-CM

## 2018-01-11 MED ORDER — CHLORHEXIDINE GLUCONATE 4 % EX LIQD: Freq: Every day | CUTANEOUS | 1 refills | Status: DC

## 2018-01-11 MED ORDER — MUPIROCIN 2 % EX OINT: 1.0000 "application " | TOPICAL_OINTMENT | Freq: Two times a day (BID) | CUTANEOUS | 1 refills | Status: DC

## 2018-01-11 NOTE — Progress Notes (Signed)
[facility]      Reason for Consult: recurrent MRSA boilds    Referring Physician: Dr. Buelah Manis    Patient ID: [name], female    DOB: 1961-04-17, 57 y.o.   MRN: [id number]  HPI:   She comes in with above complaint.  She has a long history of reported MRSA infections since she was 21 and reports every cut or abrasion becomes infected with mRSA.  Recent MRSA cultures of wounds noted.  No associated fever.  Has tried hibiclens before and bactroban.  Has done some environmental cleaning.  No pets.  Lives with female partner, daughter temporarily living with her.  No one else around her infected that she is aware of.   Previous record reviewed in Epic and recent cultures.    Past Medical History:  Diagnosis Date  . Anxiety    BILATERAL HANDS-- LEFT >RIGHT AND LEGS  . Arthritis   . Asthma    NO INHALER  . Bed bug bite   . Blood transfusion without reported diagnosis   . CA - skin cancer   . Chronic pain due to injury RIGHT HAND/WRIST IN 2007  . Complication of anesthesia    unable to urinate after surgery  . COPD (chronic obstructive pulmonary disease) (HCC)    Stage 1  . Crush injury of hand RIGHT IN 2007   X 6 SURG'S  LAST ONE BEING TOTAL WRIST FUSION W/ BONE GRAFT AND PLATE  . Cubital tunnel syndrome    Left elbow  . Depression   . ETOH abuse   . GERD (gastroesophageal reflux disease)    05/11/17- not recently  . Hemorrhoids   . History of kidney stones    passed  . MRSA (methicillin resistant Staphylococcus aureus) carrier   . Neuromuscular disorder (HCC)    some fibromyalgia  . Neuropathy   . PTSD (post-traumatic stress disorder) 2007 RIGHT HAND CRUSH INJURY W/ 2 FINGER AMPUTATION  . Stroke ([place]) X2 CVA'S  IN 1990   LOSS OF VISION RIGHT Peripherial bilteral  . Substance abuse ([place])    hx: narcotic use  . UC (ulcerative colitis) ([place])   . Weakness of right leg CAUSED BY RETRIEVAL THIGH MUSCLE FOR FLAP RIGHT HAND INJURY-- 2006   RIGHT LEG GIVES OUT OCCASIONALLY    Prior to Admission medications   Medication Sig Start Date End Date Taking? Authorizing Provider  albuterol (PROVENTIL HFA;VENTOLIN HFA) 108 (90 Base) MCG/ACT inhaler Inhale 2 puffs into the lungs every 6 (six) hours as needed for wheezing or shortness of breath. 10/12/16  Yes [place], Modena Nunnery, MD  BIOTIN PO Take by mouth.   Yes [provider]  gabapentin (NEURONTIN) 300 MG capsule TAKE ONE CAPSULE BY MOUTH 3 TIMES A DAY 07/25/17  Yes Susy Frizzle, MD  mesalamine (CANASA) 1000 MG suppository Place 1 suppository (1,000 mg total) rectally at bedtime. 07/10/17  Yes Susy Frizzle, MD  umeclidinium bromide (INCRUSE ELLIPTA) 62.5 MCG/INH AEPB Inhale 1 puff into the lungs daily. 11/22/17  Yes Susy Frizzle, MD  chlorhexidine (HIBICLENS) 4 % external liquid Apply topically daily as needed. Apply to skin as directed Patient not taking: Reported on 01/11/2018 12/28/17   Alycia Rossetti, MD  chlorhexidine (HIBICLENS) 4 % external liquid Apply topically daily. For 5 days 01/11/18   Thayer Headings, MD  mupirocin ointment (BACTROBAN) 2 % Place 1 application into the nose 2 (two) times daily. For 5 days 01/11/18  Thayer Headings, MD  sulfamethoxazole-trimethoprim (BACTRIM DS,SEPTRA DS) 800-160 MG tablet Take 1 tablet by mouth 2 (two) times daily. Patient not taking: Reported on 01/11/2018 12/28/17   Alycia Rossetti, MD    Allergies  Allergen Reactions  . Aspirin Shortness Of Breath    ASTHMATIC ATTACK  . Codeine Other (See Comments)    ABNORMAL BEHAVIOR  Increased in bad moods  NO CODEINE BASED MEDICATIONS   . Vicodin [Hydrocodone-Acetaminophen] Other (See Comments)    Extreme anxiety    Social History   Tobacco Use  . Smoking status: Former Smoker    Packs/day: 1.50    Years: 28.00    Pack years: 42.00    Types: Cigarettes    Last attempt to quit: 11/29/2001    Years since quitting: 16.1  . Smokeless tobacco: Never Used  Substance Use  Topics  . Alcohol use: Yes    Alcohol/week: 6.0 oz    Types: 10 Standard drinks or equivalent per week    Comment: 05/11/17- increased this week due to pain  . Drug use: Yes    Types: Marijuana    Family History  Problem Relation Age of Onset  . Lupus Mother        died at 14  . Breast cancer Maternal Aunt 35  . Breast cancer Maternal Grandmother 60  . Stomach cancer Paternal Grandfather   . Colon cancer Neg Hx   . Rectal cancer Neg Hx   . Esophageal cancer Neg Hx     Review of Systems  Constitutional: negative for fevers, chills and fatigue Gastrointestinal: negative for diarrhea Integument/breast: negative for rash All other systems reviewed and are negative    Constitutional: in no apparent distress and alert  Vitals:   01/11/18 0905  BP: 117/68  Pulse: (!) 53  Temp: 98.2 F (36.8 C)   EYES: anicteric ENMT: no thrush Cardiovascular: Cor RRR Respiratory: CTA B; normal respiratory effort GI: Bowel sounds are normal, liver is not enlarged, spleen is not enlarged Musculoskeletal: no pedal edema noted Skin: negatives: no rash Hematologic: no cervical lad  Labs: Lab Results  Component Value Date   WBC 7.9 10/05/2017   HGB 12.5 10/05/2017   HCT 37.4 10/05/2017   MCV 92.1 10/05/2017   PLT 341 10/05/2017    Lab Results  Component Value Date   CREATININE 0.73 10/05/2017   BUN 14 10/05/2017   NA 141 10/05/2017   K 4.6 10/05/2017   CL 106 10/05/2017   CO2 29 10/05/2017    Lab Results  Component Value Date   ALT 11 10/05/2017   AST 18 10/05/2017   ALKPHOS 46 01/20/2017   BILITOT 0.2 10/05/2017     Assessment: recurrent MRSA skin infections.  I discussed repeating bactroban and hibiclens and adding in bleach baths.  Instructions given including bleach baths of housemates, environmental cleaning.     Plan: 1) bactroban and hibiclens 2) bleach baths Return as needed

## 2018-01-11 NOTE — Patient Instructions (Signed)
[facility] Five day Treatment Plan for MRSA (Staph) Decolonization  Please let us know if you have questions or concerns or do not understand the information we give you.  Prepare Chose a period when you will be uninterrupted by going away or other distractions. To ensure that your skin is in good condition, follow the Routine Skin Care principles below to reduce drying and enhance healing. Do not start while you have any active boils. Routine Skin Care principles to reduce drying and enhance healing:  Avoid the use of soap when bathing or showering. DO NOT routinely use antiseptic solutions. If you need to use something, chose a soap substitute (examples -QV Wash or Cetaphil).   When drying with a towel, be gentle and pat dry your skin. Avoid rubbing the skin.   Reduce the overall frequency of bathing or showering. A short shower (3 minutes) is better than a bath in terms of its effect on the skin.   Use a simple sorbelene based-cream on your skin prior to showering and immediately after drying. (Examples: Hydroderm or other Sorbelene-based preparations). Especially protect healing or dry areas of skin in this way. Don't use a barrier cream with a vaseline base or with perfumes and additives. You are more likely to be allergic to these products, and cause further damage to your skin.   Make sure that you clean and cover any skin cuts or grazes that occur. Try to avoid picking or biting fingernails and the skin around the nails Keep your fingernails clipped short and clean to reduce problems caused by scratching.   For itchy skin, try gently massaging sorbelene-based cream into itchy areas instead of scratching it. A long-acting non-sedating antihistamine drug is the next option to try. However make sure that you are not taking medication that will interact with this drug type.                                                                         To prepare yourself  for your treatment, it is recommended that you complete the following steps:  Remove nose, ear and other body piercing items for several days prior to the treatment and keep them out during the treatment period   Purchase a new toothbrush, disposable razor (if used), sterident for dentures (if required) and a container of alcohol hand hygiene solution (gel or rub)   Discard old toothbrushes and razors when the treatment starts. Also discard opened deodorant rollers, skin adhesive tapes, skin creams and solutions- all of these may already be contaminated with staph   Discard pumice stone(s), sponges and disposable face cloths if used    Discard all make-up brushes, creams, and implements   Discard or hot wash all fluffy toys   Wash hair brush and comb, nail files, plastic toys and cutters in the dishwasher or purchase new ones   Remove nail varnish and artificial nails  Daily routine for 5 days     *Minimize contact with members of the community during the 5 days of treatment* Body washes The effectiveness of the program increases if the correct procedure is used:  Apply the provided antiseptic body wash (2% chlorhexidine) in the shower daily   Take   care to wash hair, under the arms and into the groin and into any folds of skin   Allow the antiseptic to remain on the skin for at least 5 minutes  Nasal ointment  Disinfect your hands with alcohol gel/rub and allow to dry    Open the mupirocin 2% (Bactroban) nasal ointment.   Place small amount (size of match head) of ointment onto a clean cotton swab and massage gently around the inside of the nostril on one side, making sure not to insert it too deeply (no more than 2 cm or a little less than an inch).   Use a new cotton bud for the other nostril so that you do not contaminate the Bactroban tube with staph.    After applying the ointment, press a finger against the nose next to the nostril opening and use a circular motion to  spread the ointment within the nose   Apply the mupirocin ointment two times a day for 5 days   Disinfect your hands with alcohol rub/gel after applying the ointmentp>  Personal items (combs, razor, eyeglasses, jewelry, etc.)     Disinfect all personal items daily with an alcohol-based cleanser  House Environment and Clothes/Linens  On day 2 and 5 of the treatment, clean your house, (especially the bedroom and bathroom). Clean dust off all surfaces and then vacuum clean all floor surfaces AND soft furnishings (such as your favorite chair). If your chair/couch has a vinyl or leather covering then wipe over the chair with warm soapy water and then dry with a clean towel (which should then be washed). Staph lives in skin scales from humans that contaminate the environment. This can lead to re-infection.    Disinfect the shower floor and/or bath tub daily    On days 1, 3, AND 5 of the treatment wash your clothes, underwear, pajamas and bed linen (such as towels, sheets, washcloths, and bath mats). A hot wash with laundry detergent is best (there is no need to use expensive laundry detergent or powder). Dry clothes in sun if possible. Change into clean clothes or pajamas on those days after your shower.    Do not share or exchange any personal items of clothing  Sports/Gym     Surfaces, equipment and towels, and skin-to-skin contact are all potential sources for staph re-infection Pets Dogs and other companion animals can also be colonized with the same strains of MRSA. Best to wash or replace bedding material for the animal and wash the animal at least once during the treatment period with antiseptic solution (2% chlorhexidine wash). Ensure that the skin of the animal is kept in good condition. If the pet has any chronic skin disorders, consult your vet prior to starting your treatment process. What about my partner, family, or household members? Usually when an aggressive strain of staph moves in  to a family or household, only certain members of that group get infections (boils). This is despite the fact that the strain has probably transferred itself from person to person within the group. Those without boils may also be carrying the bacterium, however they must have better resistance (immunity) or perhaps have better skin condition. Staph likes to invade through cuts, scratches and skin with dermatitis or dryness.    Follow-up after decolonization treatment  Possible approaches will vary depending on how your treatment goes. Your provider will instruct you on the follow-up best suited for you. Some options are:   Wait and see - if no further   boils occur within 6 months then it is probable that the strain of staph has been eliminated from you.    Continue intermittent body washes 1-2 times per week with 2% aqueous chlorhexidine soap preparation or similar   Future antibiotic use  The best preventative approach to avoiding future problems may be to avoid use of antibiotics unless there is a strong indication. Antibiotics are often prescribed for minor infections or for respiratory infections that are mostly due to viruses. It is in your best interest to ride these infections out rather than taking antibiotics. Taking antibiotics alters your natural bacterial flora on your skin and in your gut. This may reduce your resistance against acquiring a resistant bacterial strain such as MRSA).   Important note: if you do become very ill with possible infection and require hospital review, it is important for you to remind your medical care providers that you have been colonized with MRSA in the past as they may have to use antibiotics that are active against the strain that you had previously.   References Wiese-Posselt et al. Clin Inf Dis 0388:82:C00  CDC:  StyleTurn.tn.html   Bleach baths  Take a bleach baths once or twice a week for at least 15 minutes with any  kind of soap for 3 weeks. Bleach baths can be a good treatment for people who do not have broken skin or eczema. They can help prevent MRSA from coming back. Use lotion after the bath, because a bleach bath can dry out the skin.   Marland Kitchen How much bleach to put in: an average full bathtub (adult level) holds about 40 gallons of water, add 1/2 cup bleach for each bleach bath.

## 2018-01-25 ENCOUNTER — Other Ambulatory Visit: Payer: Self-pay

## 2018-01-25 ENCOUNTER — Ambulatory Visit (INDEPENDENT_AMBULATORY_CARE_PROVIDER_SITE_OTHER): Payer: Medicare Other | Admitting: Family Medicine

## 2018-01-25 ENCOUNTER — Encounter: Payer: Self-pay | Admitting: Family Medicine

## 2018-01-25 VITALS — BP 102/60 | HR 62 | Temp 98.1°F | Resp 14 | Ht 62.0 in | Wt 115.5 lb

## 2018-01-25 DIAGNOSIS — R300 Dysuria: Secondary | ICD-10-CM

## 2018-01-25 DIAGNOSIS — N390 Urinary tract infection, site not specified: Secondary | ICD-10-CM | POA: Diagnosis not present

## 2018-01-25 DIAGNOSIS — Z7251 High risk heterosexual behavior: Secondary | ICD-10-CM

## 2018-01-25 DIAGNOSIS — R319 Hematuria, unspecified: Secondary | ICD-10-CM

## 2018-01-25 LAB — URINALYSIS, ROUTINE W REFLEX MICROSCOPIC: Hyaline Cast: NONE SEEN /LPF

## 2018-01-25 MED ORDER — CEPHALEXIN 500 MG PO CAPS: 500.0000 mg | ORAL_CAPSULE | Freq: Four times a day (QID) | ORAL | 0 refills | Status: AC

## 2018-01-25 MED ORDER — PHENAZOPYRIDINE HCL 200 MG PO TABS: 200.0000 mg | ORAL_TABLET | Freq: Three times a day (TID) | ORAL | 0 refills | Status: DC | PRN

## 2018-01-25 NOTE — Progress Notes (Signed)
Patient ID: [name], female    DOB: 09/07/1961, 57 y.o.   MRN: [id number]  PCP: Susy Frizzle, MD  Chief Complaint  Patient presents with  . Dysuria    x3 days- urinary frequency, burning- has been taking Pyridium     Subjective:   [name] is a 57 y.o. female, presents to clinic with CC of 3 days of dysuria with urinary frequency and urgency, with burning only when urinating and located in her urethra and vaginal area, she is also had an associated mild incontinence, and decreased urine volume.  Symptoms have been severe, intermittent, not improved with taking Azo, symptoms began after having increased frequency and duration of vaginal intercourse with her fianc.  She reports having sex for 5 to 8 hours at a time, most recently before sx she did not have lubrication and her pain and irritation started.  Her and her female partner are not monogamous, have had multiple other partners over the past year.  She states her most recent STD testing was 1 year ago, reports it was negative. She denies abdominal pain, pelvic pain, genital lesions, rashes, swelling, nausea, vomiting, flank pain, fever, chills, sweats.  She has a history of MRSA and staph infections, has been to infectious disease, no known immunocompromising conditions or medicines but she tends to have severe infections.  Patient Active Problem List   Diagnosis Date Noted  . MRSA (methicillin resistant staph aureus) culture positive 06/15/2017  . Acute sinusitis 12/13/2013  . Acute bronchitis 12/13/2013  . Laceration of finger 10/06/2013  . PTSD (post-traumatic stress disorder)      Prior to Admission medications   Medication Sig Start Date End Date Taking? Authorizing Provider  albuterol (PROVENTIL HFA;VENTOLIN HFA) 108 (90 Base) MCG/ACT inhaler Inhale 2 puffs into the lungs every 6 (six) hours as needed for wheezing or shortness of breath. 10/12/16  Yes [place], Modena Nunnery, MD  BIOTIN PO Take by mouth.    Yes [provider]  chlorhexidine (HIBICLENS) 4 % external liquid Apply topically daily as needed. Apply to skin as directed 12/28/17  Yes [place], Modena Nunnery, MD  chlorhexidine (HIBICLENS) 4 % external liquid Apply topically daily. For 5 days 01/11/18  Yes [place], Okey Regal, MD  gabapentin (NEURONTIN) 300 MG capsule TAKE ONE CAPSULE BY MOUTH 3 TIMES A DAY 07/25/17  Yes Susy Frizzle, MD  mesalamine (CANASA) 1000 MG suppository Place 1 suppository (1,000 mg total) rectally at bedtime. 07/10/17  Yes Susy Frizzle, MD  umeclidinium bromide (INCRUSE ELLIPTA) 62.5 MCG/INH AEPB Inhale 1 puff into the lungs daily. 11/22/17  Yes Susy Frizzle, MD     Allergies  Allergen Reactions  . Aspirin Shortness Of Breath    ASTHMATIC ATTACK  . Codeine Other (See Comments)    ABNORMAL BEHAVIOR  Increased in bad moods  NO CODEINE BASED MEDICATIONS   . Vicodin [Hydrocodone-Acetaminophen] Other (See Comments)    Extreme anxiety     Family History  Problem Relation Age of Onset  . Lupus Mother        died at 46  . Breast cancer Maternal Aunt 35  . Breast cancer Maternal Grandmother 60  . Stomach cancer Paternal Grandfather   . Colon cancer Neg Hx   . Rectal cancer Neg Hx   . Esophageal cancer Neg Hx      Social History   Socioeconomic History  . Marital status: Single    Spouse name: Not on file  .  Number of children: 6  . Years of education: Not on file  . Highest education level: Not on file  Occupational History  . Occupation: Secretary/administrator   Social Needs  . Financial resource strain: Not on file  . Food insecurity:    Worry: Not on file    Inability: Not on file  . Transportation needs:    Medical: Not on file    Non-medical: Not on file  Tobacco Use  . Smoking status: Former Smoker    Packs/day: 1.50    Years: 28.00    Pack years: 42.00    Types: Cigarettes    Last attempt to quit: 11/29/2001    Years since quitting: 16.1  . Smokeless tobacco: Never Used    Substance and Sexual Activity  . Alcohol use: Yes    Alcohol/week: 6.0 oz    Types: 10 Standard drinks or equivalent per week    Comment: 05/11/17- increased this week due to pain  . Drug use: Yes    Types: Marijuana  . Sexual activity: Yes    Partners: Male    Birth control/protection: Post-menopausal    Comment: 1st intercourse 57 yo-More than 5 partners  Lifestyle  . Physical activity:    Days per week: Not on file    Minutes per session: Not on file  . Stress: Not on file  Relationships  . Social connections:    Talks on phone: Not on file    Gets together: Not on file    Attends religious service: Not on file    Active member of club or organization: Not on file    Attends meetings of clubs or organizations: Not on file    Relationship status: Not on file  . Intimate partner violence:    Fear of current or ex partner: Not on file    Emotionally abused: Not on file    Physically abused: Not on file    Forced sexual activity: Not on file  Other Topics Concern  . Not on file  Social History Narrative  . Not on file     Review of Systems  Endocrine: Negative.   Genitourinary: Positive for dysuria and flank pain. Negative for difficulty urinating, dyspareunia and enuresis.  Neurological: Negative.  Negative for dizziness, weakness, light-headedness and headaches.       Objective:    Vitals:   01/25/18 1004  BP: 102/60  Pulse: 62  Resp: 14  Temp: 98.1 F (36.7 C)  TempSrc: Oral  SpO2: 98%  Weight: 115 lb 8 oz (52.4 kg)  Height: 5\' 2"  (1.575 m)      Physical Exam  Constitutional: She is oriented to person, place, and time. She appears well-developed and well-nourished.  Non-toxic appearance. No distress.  HENT:  Head: Normocephalic and atraumatic.  Right Ear: External ear normal.  Left Ear: External ear normal.  Nose: Nose normal.  Mouth/Throat: Uvula is midline, oropharynx is clear and moist and mucous membranes are normal.  Eyes: Pupils are equal,  round, and reactive to light. Conjunctivae, EOM and lids are normal.  Neck: Normal range of motion and phonation normal. Neck supple. No tracheal deviation present.  Cardiovascular: Normal rate, regular rhythm, normal heart sounds and normal pulses. Exam reveals no gallop and no friction rub.  No murmur heard. Pulses:      Radial pulses are 2+ on the right side, and 2+ on the left side.       Posterior tibial pulses are 2+ on the right side,  and 2+ on the left side.  Pulmonary/Chest: Effort normal and breath sounds normal. No stridor. No respiratory distress. She has no wheezes. She has no rhonchi. She has no rales. She exhibits no tenderness.  Abdominal: Soft. Normal appearance and bowel sounds are normal. She exhibits no distension. There is no tenderness. There is no rigidity, no rebound, no guarding and no CVA tenderness.  Musculoskeletal: Normal range of motion. She exhibits no edema or deformity.  Lymphadenopathy:    She has no cervical adenopathy.  Neurological: She is alert and oriented to person, place, and time. She exhibits normal muscle tone. Gait normal.  Skin: Skin is warm, dry and intact. Capillary refill takes less than 2 seconds. No rash noted. She is not diaphoretic. No pallor.  Psychiatric: Her speech is normal and behavior is normal. Her mood appears anxious.  Nursing note and vitals reviewed.         Assessment & Plan:      ICD-10-CM   1. Urinary tract infection with hematuria, site unspecified N39.0 Urinalysis, Routine w reflex microscopic   R31.9 Urine Culture    cephALEXin (KEFLEX) 500 MG capsule  2. Dysuria R30.0 C. trachomatis/N. gonorrhoeae RNA    phenazopyridine (PYRIDIUM) 200 MG tablet  3. High risk sexual behavior, unspecified type Z61.28     57 year old female with 3 days of dysuria, urinary frequency urgency and reduced urine output, no improvement with Azo.  She has had prolonged sexual activity with her fianc, noted to have from 5 to 8 hours of sex  at a time, most recently without having lubrication which caused significant irritation and dysuria followed.  He also has history of multiple partners, swabbing partners, and her current fianc does have other sexual partners and has had them in the last year.  I discussed with her, that her high risk behavior that I would like to add on STD testing onto urine to make sure there is no other pathogens causing her symptoms.   Urine dip unreliable because of Azo use but microscopy was concerning for UTI.  Treat with Keflex.  Urine culture added, STD testing added.     Delsa Grana, PA-C 01/25/18 10:11 AM

## 2018-01-26 ENCOUNTER — Telehealth: Payer: Self-pay | Admitting: Family Medicine

## 2018-01-26 MED ORDER — FLUCONAZOLE 150 MG PO TABS: ORAL_TABLET | ORAL | 0 refills | Status: DC

## 2018-01-26 NOTE — Telephone Encounter (Signed)
Yes - will send diflucan to pharmacy

## 2018-01-26 NOTE — Telephone Encounter (Signed)
Pt was given antibiotic yesterday and now has a yeast infection wants [place] to call in diflucan to [address].

## 2018-01-26 NOTE — Telephone Encounter (Signed)
Spoke with patient and informed her per Hinda Glatter- we are sending in diflucan. Patient verbalized understanding.

## 2018-02-01 ENCOUNTER — Ambulatory Visit (INDEPENDENT_AMBULATORY_CARE_PROVIDER_SITE_OTHER): Payer: Medicare Other | Admitting: Family Medicine

## 2018-02-01 ENCOUNTER — Encounter: Payer: Self-pay | Admitting: Family Medicine

## 2018-02-01 ENCOUNTER — Other Ambulatory Visit: Payer: Self-pay

## 2018-02-01 VITALS — BP 106/62 | HR 68 | Temp 98.7°F | Resp 14 | Ht 62.0 in | Wt 116.0 lb

## 2018-02-01 DIAGNOSIS — R300 Dysuria: Secondary | ICD-10-CM

## 2018-02-01 MED ORDER — CIPROFLOXACIN HCL 500 MG PO TABS: 500.0000 mg | ORAL_TABLET | Freq: Two times a day (BID) | ORAL | 0 refills | Status: DC

## 2018-02-01 NOTE — Progress Notes (Signed)
Subjective:    Patient ID: [name], female    DOB: [birth year], 59 y.o.   MRN: [id number]  HPI Patient was seen by my partner recently and was diagnosed with urinary tract infection.  Was started on Keflex for symptoms.  Urinalysis was significant for 20-40 white blood cells per high-power field.  However urine culture was not obtained.  Patient is still taking Keflex.  She has seen no improvement in her dysuria.  She has seen no improvement in her frequency, urgency, or hesitancy.  She is taken Pyridium and therefore I am unable to evaluate her urinalysis as her urine is the color of red Kool-Aid.  However her symptoms are consistent with a bladder infection  Past Medical History:  Diagnosis Date  . Anxiety    BILATERAL HANDS-- LEFT >RIGHT AND LEGS  . Arthritis   . Asthma    NO INHALER  . Bed bug bite   . Blood transfusion without reported diagnosis   . CA - skin cancer   . Chronic pain due to injury RIGHT HAND/WRIST IN 2007  . Complication of anesthesia    unable to urinate after surgery  . COPD (chronic obstructive pulmonary disease) (HCC)    Stage 1  . Crush injury of hand RIGHT IN 2007   X 6 SURG'S  LAST ONE BEING TOTAL WRIST FUSION W/ BONE GRAFT AND PLATE  . Cubital tunnel syndrome    Left elbow  . Depression   . ETOH abuse   . GERD (gastroesophageal reflux disease)    05/11/17- not recently  . Hemorrhoids   . History of kidney stones    passed  . MRSA (methicillin resistant Staphylococcus aureus) carrier   . Neuromuscular disorder (HCC)    some fibromyalgia  . Neuropathy   . PTSD (post-traumatic stress disorder) 2007 RIGHT HAND CRUSH INJURY W/ 2 FINGER AMPUTATION  . Stroke ([place]) X2 CVA'S  IN 1990   LOSS OF VISION RIGHT Peripherial bilteral  . Substance abuse ([place])    hx: narcotic use  . UC (ulcerative colitis) ([place])   . Weakness of right leg CAUSED BY RETRIEVAL THIGH MUSCLE FOR FLAP RIGHT HAND INJURY-- 2006   RIGHT LEG GIVES OUT OCCASIONALLY   Past  Surgical History:  Procedure Laterality Date  . AMPUTATION Left 05/13/2017   Procedure: Left small finger revision amputation ;  Surgeon: Roseanne Kaufman, MD;  Location: [facility];  Service: Orthopedics;  Laterality: Left;  60 mins  . COLON SURGERY     15 years ago- fistula on colon removed  . COSMETIC SURGERY     right leg and abdomen  . DILATATION & CURETTAGE/HYSTEROSCOPY WITH MYOSURE N/A 06/14/2016   Procedure: [place];  Surgeon: Anastasio Auerbach, MD;  Location: [facility];  Service: Gynecology;  Laterality: N/A;  . HAND CARPECTOMY  12-24-2005   RIGHT-- INCLUDING  REMOVAL  WRIST WIRES AND I &D (EXICIOSNAL)  FOR OSTEOMYELITIS  . HYSTEROSCOPY  12.6.2012   w/removal of polyp  . HYSTEROSCOPY W/D&C  08/18/2011   Procedure: DILATATION AND CURETTAGE (D&C) /HYSTEROSCOPY;  Surgeon: Anastasio Auerbach, MD;  Location: [facility];  Service: Gynecology;  Laterality: N/A;  . INCISION / DRAINAGE HAND / FINGER  X3  (2006 & 2007)   RIGHT CRUSH WOUND  . LESION REMOVAL  08/18/2011   Procedure: EXCISION VAGINAL LESION;  Surgeon: Anastasio Auerbach, MD;  Location: [facility];  Service: Gynecology;  Laterality: N/A;  . ORIF CARPAL  BONE FRACTURE  08-31-05   I & D OF RIGHT HAND CRUSH INJURY/ OPEN FX'S AND INDEX FINGER RAY RESECTION/ COMPLICATED CLOSURE  . ORIF RADIAL FRACTURE  09-04-05   DISTAL RADIAL PLATE AND PARTIAL THUMB AMPUTATION  . RIGHT TOTAL WRIST FUSION W/ BONE GRAFT AND PLATE  [id number]   AND FLAP USING RIGHT GOIN MUSCLE  . skin cancer removal  2007   throat area  . TUBAL LIGATION  15 YRS AGO   Current Outpatient Medications on File Prior to Visit  Medication Sig Dispense Refill  . albuterol (PROVENTIL HFA;VENTOLIN HFA) 108 (90 Base) MCG/ACT inhaler Inhale 2 puffs into the lungs every 6 (six) hours as needed for wheezing or shortness of breath. 1 Inhaler 1  . BIOTIN PO Take by mouth.    . cephALEXin (KEFLEX) 500 MG capsule Take 1  capsule (500 mg total) by mouth 4 (four) times daily for 7 days. 28 capsule 0  . chlorhexidine (HIBICLENS) 4 % external liquid Apply topically daily as needed. Apply to skin as directed 120 mL 0  . chlorhexidine (HIBICLENS) 4 % external liquid Apply topically daily. For 5 days 473 mL 1  . gabapentin (NEURONTIN) 300 MG capsule TAKE ONE CAPSULE BY MOUTH 3 TIMES A DAY 270 capsule 2  . mesalamine (CANASA) 1000 MG suppository Place 1 suppository (1,000 mg total) rectally at bedtime. 30 suppository 6  . phenazopyridine (PYRIDIUM) 200 MG tablet Take 1 tablet (200 mg total) by mouth 3 (three) times daily as needed for pain. 10 tablet 0  . umeclidinium bromide (INCRUSE ELLIPTA) 62.5 MCG/INH AEPB Inhale 1 puff into the lungs daily. 90 each 3   No current facility-administered medications on file prior to visit.    Allergies  Allergen Reactions  . Aspirin Shortness Of Breath    ASTHMATIC ATTACK  . Codeine Other (See Comments)    ABNORMAL BEHAVIOR  Increased in bad moods  NO CODEINE BASED MEDICATIONS   . Vicodin [Hydrocodone-Acetaminophen] Other (See Comments)    Extreme anxiety   Social History   Socioeconomic History  . Marital status: Single    Spouse name: Not on file  . Number of children: 6  . Years of education: Not on file  . Highest education level: Not on file  Occupational History  . Occupation: Secretary/administrator   Social Needs  . Financial resource strain: Not on file  . Food insecurity:    Worry: Not on file    Inability: Not on file  . Transportation needs:    Medical: Not on file    Non-medical: Not on file  Tobacco Use  . Smoking status: Former Smoker    Packs/day: 1.50    Years: 28.00    Pack years: 42.00    Types: Cigarettes    Last attempt to quit: 11/29/2001    Years since quitting: 16.1  . Smokeless tobacco: Never Used  Substance and Sexual Activity  . Alcohol use: Yes    Alcohol/week: 6.0 oz    Types: 10 Standard drinks or equivalent per week    Comment:  05/11/17- increased this week due to pain  . Drug use: Yes    Types: Marijuana  . Sexual activity: Yes    Partners: Male    Birth control/protection: Post-menopausal    Comment: 1st intercourse 57 yo-More than 5 partners  Lifestyle  . Physical activity:    Days per week: Not on file    Minutes per session: Not on file  . Stress: Not on file  Relationships  . Social connections:    Talks on phone: Not on file    Gets together: Not on file    Attends religious service: Not on file    Active member of club or organization: Not on file    Attends meetings of clubs or organizations: Not on file    Relationship status: Not on file  . Intimate partner violence:    Fear of current or ex partner: Not on file    Emotionally abused: Not on file    Physically abused: Not on file    Forced sexual activity: Not on file  Other Topics Concern  . Not on file  Social History Narrative  . Not on file     Review of Systems  All other systems reviewed and are negative.      Objective:   Physical Exam  Cardiovascular: Normal rate, regular rhythm and normal heart sounds.  No murmur heard. Pulmonary/Chest: Effort normal and breath sounds normal.  Abdominal: Soft. Bowel sounds are normal. She exhibits no distension and no mass. There is no tenderness. There is no guarding.  Vitals reviewed.         Assessment & Plan:  Dysuria - Plan: Urinalysis, Routine w reflex microscopic, Urine Culture, ciprofloxacin (CIPRO) 500 MG tablet, Urine Culture  I will treat as a complicated UTI with Cipro 500 mg p.o. twice daily for 7 days.  Send urine culture to evaluate species and sensitivities.  Recommended that she empty her bladder immediately after sex to help prevent the future.  If recurrent urinary tract infections become a problem, consider topical estrogen vaginal cream as a preventative strategy

## 2018-02-02 LAB — URINE CULTURE
MICRO NUMBER:: 90627845
Result:: NO GROWTH
SPECIMEN QUALITY:: ADEQUATE

## 2018-02-26 DIAGNOSIS — D2362 Other benign neoplasm of skin of left upper limb, including shoulder: Secondary | ICD-10-CM | POA: Diagnosis not present

## 2018-02-26 DIAGNOSIS — L821 Other seborrheic keratosis: Secondary | ICD-10-CM | POA: Diagnosis not present

## 2018-02-26 DIAGNOSIS — L570 Actinic keratosis: Secondary | ICD-10-CM | POA: Diagnosis not present

## 2018-02-26 DIAGNOSIS — Z85828 Personal history of other malignant neoplasm of skin: Secondary | ICD-10-CM | POA: Diagnosis not present

## 2018-02-26 DIAGNOSIS — D225 Melanocytic nevi of trunk: Secondary | ICD-10-CM | POA: Diagnosis not present

## 2018-03-01 ENCOUNTER — Ambulatory Visit: Payer: Medicare Other | Admitting: Gynecology

## 2018-03-01 ENCOUNTER — Encounter: Payer: Self-pay | Admitting: Gynecology

## 2018-03-01 VITALS — BP 118/76

## 2018-03-01 DIAGNOSIS — N941 Unspecified dyspareunia: Secondary | ICD-10-CM | POA: Diagnosis not present

## 2018-03-01 DIAGNOSIS — N952 Postmenopausal atrophic vaginitis: Secondary | ICD-10-CM | POA: Diagnosis not present

## 2018-03-01 NOTE — Progress Notes (Signed)
    [name] 02/22/1961 [id number]        57 y.o.  G6P6 presents complaining of painful intercourse with some mild spotting after intercourse.  Patient relates recently getting married and having very frequent prolonged intercourse.  She states we have had intercourse up to 8 hours a day.  She notes a lot of vaginal irritation discomfort and occasional spotting after this.  No bleeding otherwise.  No pelvic pain otherwise.  No significant hot flushes or night sweats.  Had been evaluated for endometrial polyp 05/2016 with resection and final pathology showing benign polyp with atrophic endometrium.  Past medical history,surgical history, problem list, medications, allergies, family history and social history were all reviewed and documented in the EPIC chart.  Directed ROS with pertinent positives and negatives documented in the history of present illness/assessment and plan.  Exam: [name] assistant Vitals:   03/01/18 1157  BP: 118/76   General appearance:  Normal Abdomen soft nontender without masses guarding rebound Pelvic external BUS vagina with atrophic changes.  No bleeding or lesions noted.  Cervix normal.  Uterus normal size midline mobile nontender.  Adnexa without masses or tenderness.  Assessment/Plan:  57 y.o. G6P6 with atrophic vaginal changes and dyspareunia secondary to this.  We discussed the spotting is likely due to vaginitis irritation.  I discussed the issues of endometrial abnormalities up to and including endometrial cancer but I think circumstantially given the history most likely traumatic in origin.  Whether to proceed with endometrial evaluation at this time was discussed but she is comfortable with not doing this but treating the vaginitis first and if spotting or any bleeding would occur after this is corrected or independent of intercourse then we will pursue more involved evaluation.  Treatment for the vaginitis was discussed.  She is using lubricants and  moisturizers which do not seem to help.  Vaginal estrogen supplements to include cream, tablets, ring and Osphena reviewed.  The pros and cons of each choice discussed.  Ultimately she would like to try the formulated estradiol vaginal cream twice weekly through [facility].  We discussed the absorption risks with systemic effects such as thrombosis stroke heart attack DVT breast cancer and endometrial stimulation issues.  She is comfortable with this and feels it is really a quality of life issue and she needs to do something.  She will go ahead and start this now with the 46-month supply and 1 refill provided.  Assuming she has a good response she is due for her annual exam in the fall.  She knows to call sooner if she has any bleeding.  Greater than 50% of my time was spent in direct face to face counseling and coordination of care with the patient.     Anastasio Auerbach MD, 12:41 PM 03/01/2018

## 2018-03-01 NOTE — Patient Instructions (Signed)
Start on the vaginal estrogen cream twice weekly from [facility] as we discussed.  Follow-up if this continues to be an issue.

## 2018-03-02 ENCOUNTER — Telehealth: Payer: Self-pay | Admitting: *Deleted

## 2018-03-02 MED ORDER — NONFORMULARY OR COMPOUNDED ITEM: 3 refills | Status: DC

## 2018-03-02 NOTE — Telephone Encounter (Signed)
-----   Message from Anastasio Auerbach, MD sent at 03/01/2018 12:19 PM EDT ----- Call into [facility] vaginal estradiol prefilled syringes twice weekly 33-month supply refill x1

## 2018-03-02 NOTE — Telephone Encounter (Signed)
Rx called in 

## 2018-03-04 ENCOUNTER — Other Ambulatory Visit: Payer: Self-pay | Admitting: Family Medicine

## 2018-03-13 ENCOUNTER — Encounter: Payer: Self-pay | Admitting: Family Medicine

## 2018-03-13 ENCOUNTER — Ambulatory Visit (INDEPENDENT_AMBULATORY_CARE_PROVIDER_SITE_OTHER): Payer: Medicare Other | Admitting: Family Medicine

## 2018-03-13 ENCOUNTER — Other Ambulatory Visit: Payer: Self-pay

## 2018-03-13 VITALS — BP 118/74 | HR 60 | Temp 98.3°F | Resp 15 | Wt 115.0 lb

## 2018-03-13 DIAGNOSIS — L089 Local infection of the skin and subcutaneous tissue, unspecified: Secondary | ICD-10-CM

## 2018-03-13 DIAGNOSIS — L0231 Cutaneous abscess of buttock: Secondary | ICD-10-CM

## 2018-03-13 MED ORDER — DOXYCYCLINE HYCLATE 100 MG PO TABS: 100.0000 mg | ORAL_TABLET | Freq: Two times a day (BID) | ORAL | 0 refills | Status: AC

## 2018-03-13 NOTE — Progress Notes (Signed)
Patient ID: [name], female    DOB: 07-31-1961, 57 y.o.   MRN: [id number]  PCP: Susy Frizzle, MD  Chief Complaint  Patient presents with  . Recurrent Skin Infections    Patient in today with c/o boil to right upper leg    Subjective:   [name] is a 57 y.o. female, presents to clinic with CC of red bump to right buttock and upper thigh, gets skin infections and abscesses frequently, she is going on vacation and wants 10+ days of doxycycline to treat it.  She carries MRSA, does abx ointment tx to her nose.  She is doing hibiclens baths and is established with ID specialists and derm (she states when offered).   She states "I will not go in there and I will not shut the door" as she stands outside the exam room in clinic.  When asked to go back into room, and not close the door becauseI understand that she her anxiety with the door closed, she yelled, "no, I just want an antibiotic prescription and to leave."  When asked to at least step back into the exam room leaving the door open, to protect patient privacy (other patients were in the hallways finishing their care, and referral and administrative area is a few feet away) she said, "no I'm not listening."   Appropriate HPI could not be completed thoroughly because of pt's aggressive behavior, refusal to wait in exam room, and refusal to allow physical exam.  Patient Active Problem List   Diagnosis Date Noted  . MRSA (methicillin resistant staph aureus) culture positive 06/15/2017  . Acute sinusitis 12/13/2013  . Acute bronchitis 12/13/2013  . Laceration of finger 10/06/2013  . PTSD (post-traumatic stress disorder)      Prior to Admission medications   Medication Sig Start Date End Date Taking? Authorizing Provider  albuterol (PROVENTIL HFA;VENTOLIN HFA) 108 (90 Base) MCG/ACT inhaler Inhale 2 puffs into the lungs every 6 (six) hours as needed for wheezing or shortness of breath. 10/12/16  Yes [place], Modena Nunnery, MD   BIOTIN PO Take by mouth.   Yes [provider]  chlorhexidine (HIBICLENS) 4 % external liquid Apply topically daily as needed. Apply to skin as directed 12/28/17  Yes [place], Modena Nunnery, MD  mesalamine (CANASA) 1000 MG suppository PLACE 1 SUPPOSITORY (1,000 MG TOTAL) RECTALLY AT BEDTIME. 03/05/18  Yes Susy Frizzle, MD  NONFORMULARY OR COMPOUNDED ITEM Estradiol vaginal cream 8.09% insert 1 applicator twice weekly 03/02/18  Yes [place], Belinda Block, MD  OVER THE COUNTER MEDICATION CBD Gummy 5mg    Yes [provider]  umeclidinium bromide (INCRUSE ELLIPTA) 62.5 MCG/INH AEPB Inhale 1 puff into the lungs daily. 11/22/17  Yes Susy Frizzle, MD     Allergies  Allergen Reactions  . Aspirin Shortness Of Breath    ASTHMATIC ATTACK  . Codeine Other (See Comments)    ABNORMAL BEHAVIOR  Increased in bad moods  NO CODEINE BASED MEDICATIONS   . Vicodin [Hydrocodone-Acetaminophen] Other (See Comments)    Extreme anxiety     Family History  Problem Relation Age of Onset  . Lupus Mother        died at 35  . Breast cancer Maternal Aunt 35  . Breast cancer Maternal Grandmother 60  . Stomach cancer Paternal Grandfather   . Colon cancer Neg Hx   . Rectal cancer Neg Hx   . Esophageal cancer Neg Hx      Social History  Socioeconomic History  . Marital status: Married    Spouse name: Not on file  . Number of children: 6  . Years of education: Not on file  . Highest education level: Not on file  Occupational History  . Occupation: Secretary/administrator   Social Needs  . Financial resource strain: Not on file  . Food insecurity:    Worry: Not on file    Inability: Not on file  . Transportation needs:    Medical: Not on file    Non-medical: Not on file  Tobacco Use  . Smoking status: Former Smoker    Packs/day: 1.50    Years: 28.00    Pack years: 42.00    Types: Cigarettes    Last attempt to quit: 11/29/2001    Years since quitting: 16.2  . Smokeless tobacco: Never Used   Substance and Sexual Activity  . Alcohol use: Yes    Alcohol/week: 6.0 oz    Types: 10 Standard drinks or equivalent per week    Comment: 05/11/17- increased this week due to pain  . Drug use: Yes    Types: Marijuana  . Sexual activity: Yes    Partners: Male    Birth control/protection: Post-menopausal    Comment: 1st intercourse 57 yo-More than 5 partners  Lifestyle  . Physical activity:    Days per week: Not on file    Minutes per session: Not on file  . Stress: Not on file  Relationships  . Social connections:    Talks on phone: Not on file    Gets together: Not on file    Attends religious service: Not on file    Active member of club or organization: Not on file    Attends meetings of clubs or organizations: Not on file    Relationship status: Not on file  . Intimate partner violence:    Fear of current or ex partner: Not on file    Emotionally abused: Not on file    Physically abused: Not on file    Forced sexual activity: Not on file  Other Topics Concern  . Not on file  Social History Narrative  . Not on file     Review of Systems  Unable to perform ROS: Other (pt noncompliant)       Objective:    Vitals:   03/13/18 1439  BP: 118/74  Pulse: 60  Resp: 15  Temp: 98.3 F (36.8 C)  TempSrc: Oral  SpO2: 97%  Weight: 115 lb (52.2 kg)      Physical Exam  Constitutional: She appears well-developed and well-nourished.  Non-toxic appearance. She does not have a sickly appearance. She does not appear ill. No distress.  HENT:  Head: Normocephalic.  Eyes: Conjunctivae and lids are normal.  Neck: Full passive range of motion without pain.  Pulmonary/Chest: No tachypnea. No respiratory distress.  Skin: Skin is intact. She is not diaphoretic. No pallor.  Small 1-2 cm circular erythematous bump - pt will only lift up her shorts and show me from across the room, will not allow me to touch  Psychiatric: Her mood appears anxious. Her speech is rapid and/or  pressured. She is agitated and aggressive. She expresses inappropriate judgment.          Assessment & Plan:      ICD-10-CM   1. Recurrent infection of skin L08.9   2. Abscess of right buttock L02.31        Ann Grana, PA-C 03/13/18 3:02 PM

## 2018-03-19 DIAGNOSIS — M25522 Pain in left elbow: Secondary | ICD-10-CM | POA: Diagnosis not present

## 2018-03-19 DIAGNOSIS — G5622 Lesion of ulnar nerve, left upper limb: Secondary | ICD-10-CM | POA: Insufficient documentation

## 2018-03-19 DIAGNOSIS — G5602 Carpal tunnel syndrome, left upper limb: Secondary | ICD-10-CM | POA: Diagnosis not present

## 2018-03-26 ENCOUNTER — Telehealth: Payer: Self-pay | Admitting: Family Medicine

## 2018-03-26 NOTE — Telephone Encounter (Signed)
Pt needs a different antibiotic called in to pharmacy for her boil it is still there and will not go away, wants antibiotic called in to [address]

## 2018-03-26 NOTE — Telephone Encounter (Signed)
Patient is scheduled with Dr. Dennard Schaumann for tomorrow.

## 2018-03-26 NOTE — Telephone Encounter (Signed)
Pt will need to be evaluated in office for unimproving infection/abscess.  Please see Eliezer Mccoy for details on scheduling, should pt be willing to come in.

## 2018-03-27 ENCOUNTER — Ambulatory Visit (INDEPENDENT_AMBULATORY_CARE_PROVIDER_SITE_OTHER): Payer: Medicare Other | Admitting: Family Medicine

## 2018-03-27 ENCOUNTER — Encounter: Payer: Self-pay | Admitting: Family Medicine

## 2018-03-27 VITALS — BP 140/100 | HR 56 | Temp 98.1°F | Resp 16 | Ht 62.0 in | Wt 117.0 lb

## 2018-03-27 DIAGNOSIS — A4902 Methicillin resistant Staphylococcus aureus infection, unspecified site: Secondary | ICD-10-CM | POA: Diagnosis not present

## 2018-03-27 MED ORDER — MUPIROCIN 2 % EX OINT: 1.0000 "application " | TOPICAL_OINTMENT | Freq: Three times a day (TID) | CUTANEOUS | 0 refills | Status: DC

## 2018-03-27 NOTE — Progress Notes (Signed)
Subjective:    Patient ID: [name], female    DOB: 1960/09/26, 57 y.o.   MRN: [id number]  HPI Patient was recently seen by my partner and was treated with doxycycline for a presumed abscess on her right gluteus.  She states that the lesion has improved dramatically although it still present.  The lesion eventually spontaneously ruptured and drained purulent material.  Today is an erythematous patch of skin roughly the size of a quarter.  There is no induration.  There is no fluctuance.  There is no pustule.  She is concerned because she is now out of antibiotics of the lesion has not totally subsided. Past Medical History:  Diagnosis Date  . Anxiety    BILATERAL HANDS-- LEFT >RIGHT AND LEGS  . Arthritis   . Asthma    NO INHALER  . Bed bug bite   . Blood transfusion without reported diagnosis   . CA - skin cancer   . Chronic pain due to injury RIGHT HAND/WRIST IN 2007  . Complication of anesthesia    unable to urinate after surgery  . COPD (chronic obstructive pulmonary disease) (HCC)    Stage 1  . Crush injury of hand RIGHT IN 2007   X 6 SURG'S  LAST ONE BEING TOTAL WRIST FUSION W/ BONE GRAFT AND PLATE  . Cubital tunnel syndrome    Left elbow  . Depression   . ETOH abuse   . GERD (gastroesophageal reflux disease)    05/11/17- not recently  . Hemorrhoids   . History of kidney stones    passed  . MRSA (methicillin resistant Staphylococcus aureus) carrier   . Neuromuscular disorder (HCC)    some fibromyalgia  . Neuropathy   . PTSD (post-traumatic stress disorder) 2007 RIGHT HAND CRUSH INJURY W/ 2 FINGER AMPUTATION  . Stroke ([place]) X2 CVA'S  IN 1990   LOSS OF VISION RIGHT Peripherial bilteral  . Substance abuse ([place])    hx: narcotic use  . UC (ulcerative colitis) ([place])   . Weakness of right leg CAUSED BY RETRIEVAL THIGH MUSCLE FOR FLAP RIGHT HAND INJURY-- 2006   RIGHT LEG GIVES OUT OCCASIONALLY   Past Surgical History:  Procedure Laterality Date  . AMPUTATION Left  05/13/2017   Procedure: Left small finger revision amputation ;  Surgeon: Roseanne Kaufman, MD;  Location: [facility];  Service: Orthopedics;  Laterality: Left;  60 mins  . COLON SURGERY     15 years ago- fistula on colon removed  . COSMETIC SURGERY     right leg and abdomen  . DILATATION & CURETTAGE/HYSTEROSCOPY WITH MYOSURE N/A 06/14/2016   Procedure: [place];  Surgeon: Anastasio Auerbach, MD;  Location: [facility];  Service: Gynecology;  Laterality: N/A;  . HAND CARPECTOMY  12-24-2005   RIGHT-- INCLUDING  REMOVAL  WRIST WIRES AND I &D (EXICIOSNAL)  FOR OSTEOMYELITIS  . HYSTEROSCOPY  12.6.2012   w/removal of polyp  . HYSTEROSCOPY W/D&C  08/18/2011   Procedure: DILATATION AND CURETTAGE (D&C) /HYSTEROSCOPY;  Surgeon: Anastasio Auerbach, MD;  Location: [facility];  Service: Gynecology;  Laterality: N/A;  . INCISION / DRAINAGE HAND / FINGER  X3  (2006 & 2007)   RIGHT CRUSH WOUND  . LESION REMOVAL  08/18/2011   Procedure: EXCISION VAGINAL LESION;  Surgeon: Anastasio Auerbach, MD;  Location: [facility];  Service: Gynecology;  Laterality: N/A;  . ORIF CARPAL BONE FRACTURE  08-31-05   I & D OF RIGHT  HAND CRUSH INJURY/ OPEN FX'S AND INDEX FINGER RAY RESECTION/ COMPLICATED CLOSURE  . ORIF RADIAL FRACTURE  09-04-05   DISTAL RADIAL PLATE AND PARTIAL THUMB AMPUTATION  . RIGHT TOTAL WRIST FUSION W/ BONE GRAFT AND PLATE  [id number]   AND FLAP USING RIGHT GOIN MUSCLE  . skin cancer removal  2007   throat area  . TUBAL LIGATION  15 YRS AGO   Current Outpatient Medications on File Prior to Visit  Medication Sig Dispense Refill  . albuterol (PROVENTIL HFA;VENTOLIN HFA) 108 (90 Base) MCG/ACT inhaler Inhale 2 puffs into the lungs every 6 (six) hours as needed for wheezing or shortness of breath. 1 Inhaler 1  . BIOTIN PO Take by mouth.    . chlorhexidine (HIBICLENS) 4 % external liquid Apply topically daily as needed. Apply to skin as directed 120  mL 0  . mesalamine (CANASA) 1000 MG suppository PLACE 1 SUPPOSITORY (1,000 MG TOTAL) RECTALLY AT BEDTIME. 90 suppository 2  . NONFORMULARY OR COMPOUNDED ITEM Estradiol vaginal cream 5.40% insert 1 applicator twice weekly 90 each 3  . OVER THE COUNTER MEDICATION CBD Gummy 53m    . umeclidinium bromide (INCRUSE ELLIPTA) 62.5 MCG/INH AEPB Inhale 1 puff into the lungs daily. 90 each 3   No current facility-administered medications on file prior to visit.    Allergies  Allergen Reactions  . Aspirin Shortness Of Breath    ASTHMATIC ATTACK  . Codeine Other (See Comments)    ABNORMAL BEHAVIOR  Increased in bad moods  NO CODEINE BASED MEDICATIONS   . Vicodin [Hydrocodone-Acetaminophen] Other (See Comments)    Extreme anxiety   Social History   Socioeconomic History  . Marital status: Married    Spouse name: Not on file  . Number of children: 6  . Years of education: Not on file  . Highest education level: Not on file  Occupational History  . Occupation: HSecretary/administrator  Social Needs  . Financial resource strain: Not on file  . Food insecurity:    Worry: Not on file    Inability: Not on file  . Transportation needs:    Medical: Not on file    Non-medical: Not on file  Tobacco Use  . Smoking status: Former Smoker    Packs/day: 1.50    Years: 28.00    Pack years: 42.00    Types: Cigarettes    Last attempt to quit: 11/29/2001    Years since quitting: 16.3  . Smokeless tobacco: Never Used  Substance and Sexual Activity  . Alcohol use: Yes    Alcohol/week: 6.0 oz    Types: 10 Standard drinks or equivalent per week    Comment: 05/11/17- increased this week due to pain  . Drug use: Yes    Types: Marijuana  . Sexual activity: Yes    Partners: Male    Birth control/protection: Post-menopausal    Comment: 1st intercourse 57yo-More than 5 partners  Lifestyle  . Physical activity:    Days per week: Not on file    Minutes per session: Not on file  . Stress: Not on file    Relationships  . Social connections:    Talks on phone: Not on file    Gets together: Not on file    Attends religious service: Not on file    Active member of club or organization: Not on file    Attends meetings of clubs or organizations: Not on file    Relationship status: Not on file  . Intimate partner  violence:    Fear of current or ex partner: Not on file    Emotionally abused: Not on file    Physically abused: Not on file    Forced sexual activity: Not on file  Other Topics Concern  . Not on file  Social History Narrative  . Not on file      Review of Systems  All other systems reviewed and are negative.      Objective:   Physical Exam  Cardiovascular: Normal rate and regular rhythm.  Pulmonary/Chest: Effort normal and breath sounds normal.  Musculoskeletal:       Legs: Vitals reviewed.     Small erythematous patch of skin roughly the size of a nickel on the right gluteus.  It appears to be improving in size.  It is not indurated or fluctuant.     Assessment & Plan:  MRSA infection I have recommended using Bactroban applied topically 2-3 times a day until completely resolved.  If worsening, we could call out oral Bactrim but I am trying to reduce the patient's use of oral antibiotics to prevent resistance.  She will call me if worsening so that I can call out the oral antibiotics.  It seems to be improving.

## 2018-04-19 ENCOUNTER — Other Ambulatory Visit: Payer: Self-pay | Admitting: Family Medicine

## 2018-04-30 ENCOUNTER — Encounter: Payer: Self-pay | Admitting: Family Medicine

## 2018-04-30 ENCOUNTER — Ambulatory Visit (INDEPENDENT_AMBULATORY_CARE_PROVIDER_SITE_OTHER): Payer: Medicare Other | Admitting: Family Medicine

## 2018-04-30 VITALS — BP 128/76 | HR 56 | Temp 98.0°F | Resp 14 | Ht 62.0 in | Wt 121.0 lb

## 2018-04-30 DIAGNOSIS — K5900 Constipation, unspecified: Secondary | ICD-10-CM

## 2018-04-30 DIAGNOSIS — K594 Anal spasm: Secondary | ICD-10-CM

## 2018-04-30 NOTE — Progress Notes (Signed)
Subjective:    Patient ID: [name], female    DOB: 1960/09/26, 57 y.o.   MRN: [id number]  HPI Patient was recently seen by my partner and was treated with doxycycline for a presumed abscess on her right gluteus.  She states that the lesion has improved dramatically although it still present.  The lesion eventually spontaneously ruptured and drained purulent material.  Today is an erythematous patch of skin roughly the size of a quarter.  There is no induration.  There is no fluctuance.  There is no pustule.  She is concerned because she is now out of antibiotics of the lesion has not totally subsided. Past Medical History:  Diagnosis Date  . Anxiety    BILATERAL HANDS-- LEFT >RIGHT AND LEGS  . Arthritis   . Asthma    NO INHALER  . Bed bug bite   . Blood transfusion without reported diagnosis   . CA - skin cancer   . Chronic pain due to injury RIGHT HAND/WRIST IN 2007  . Complication of anesthesia    unable to urinate after surgery  . COPD (chronic obstructive pulmonary disease) (HCC)    Stage 1  . Crush injury of hand RIGHT IN 2007   X 6 SURG'S  LAST ONE BEING TOTAL WRIST FUSION W/ BONE GRAFT AND PLATE  . Cubital tunnel syndrome    Left elbow  . Depression   . ETOH abuse   . GERD (gastroesophageal reflux disease)    05/11/17- not recently  . Hemorrhoids   . History of kidney stones    passed  . MRSA (methicillin resistant Staphylococcus aureus) carrier   . Neuromuscular disorder (HCC)    some fibromyalgia  . Neuropathy   . PTSD (post-traumatic stress disorder) 2007 RIGHT HAND CRUSH INJURY W/ 2 FINGER AMPUTATION  . Stroke ([place]) X2 CVA'S  IN 1990   LOSS OF VISION RIGHT Peripherial bilteral  . Substance abuse ([place])    hx: narcotic use  . UC (ulcerative colitis) ([place])   . Weakness of right leg CAUSED BY RETRIEVAL THIGH MUSCLE FOR FLAP RIGHT HAND INJURY-- 2006   RIGHT LEG GIVES OUT OCCASIONALLY   Past Surgical History:  Procedure Laterality Date  . AMPUTATION Left  05/13/2017   Procedure: Left small finger revision amputation ;  Surgeon: Roseanne Kaufman, MD;  Location: [facility];  Service: Orthopedics;  Laterality: Left;  60 mins  . COLON SURGERY     15 years ago- fistula on colon removed  . COSMETIC SURGERY     right leg and abdomen  . DILATATION & CURETTAGE/HYSTEROSCOPY WITH MYOSURE N/A 06/14/2016   Procedure: [place];  Surgeon: Anastasio Auerbach, MD;  Location: [facility];  Service: Gynecology;  Laterality: N/A;  . HAND CARPECTOMY  12-24-2005   RIGHT-- INCLUDING  REMOVAL  WRIST WIRES AND I &D (EXICIOSNAL)  FOR OSTEOMYELITIS  . HYSTEROSCOPY  12.6.2012   w/removal of polyp  . HYSTEROSCOPY W/D&C  08/18/2011   Procedure: DILATATION AND CURETTAGE (D&C) /HYSTEROSCOPY;  Surgeon: Anastasio Auerbach, MD;  Location: [facility];  Service: Gynecology;  Laterality: N/A;  . INCISION / DRAINAGE HAND / FINGER  X3  (2006 & 2007)   RIGHT CRUSH WOUND  . LESION REMOVAL  08/18/2011   Procedure: EXCISION VAGINAL LESION;  Surgeon: Anastasio Auerbach, MD;  Location: [facility];  Service: Gynecology;  Laterality: N/A;  . ORIF CARPAL BONE FRACTURE  08-31-05   I & D OF RIGHT  HAND CRUSH INJURY/ OPEN FX'S AND INDEX FINGER RAY RESECTION/ COMPLICATED CLOSURE  . ORIF RADIAL FRACTURE  09-04-05   DISTAL RADIAL PLATE AND PARTIAL THUMB AMPUTATION  . RIGHT TOTAL WRIST FUSION W/ BONE GRAFT AND PLATE  [id number]   AND FLAP USING RIGHT GOIN MUSCLE  . skin cancer removal  2007   throat area  . TUBAL LIGATION  15 YRS AGO   Current Outpatient Medications on File Prior to Visit  Medication Sig Dispense Refill  . albuterol (PROVENTIL HFA;VENTOLIN HFA) 108 (90 Base) MCG/ACT inhaler Inhale 2 puffs into the lungs every 6 (six) hours as needed for wheezing or shortness of breath. 1 Inhaler 1  . BIOTIN PO Take by mouth.    . mesalamine (CANASA) 1000 MG suppository PLACE 1 SUPPOSITORY (1,000 MG TOTAL) RECTALLY AT BEDTIME. 90  suppository 2  . mupirocin ointment (BACTROBAN) 2 % Apply 1 application topically 3 (three) times daily. 30 g 0  . NONFORMULARY OR COMPOUNDED ITEM Estradiol vaginal cream 9.52% insert 1 applicator twice weekly 90 each 3  . OVER THE COUNTER MEDICATION CBD Gummy 5mg     . umeclidinium bromide (INCRUSE ELLIPTA) 62.5 MCG/INH AEPB Inhale 1 puff into the lungs daily. 90 each 3   No current facility-administered medications on file prior to visit.    Allergies  Allergen Reactions  . Aspirin Shortness Of Breath    ASTHMATIC ATTACK  . Codeine Other (See Comments)    ABNORMAL BEHAVIOR  Increased in bad moods  NO CODEINE BASED MEDICATIONS   . Vicodin [Hydrocodone-Acetaminophen] Other (See Comments)    Extreme anxiety   Social History   Socioeconomic History  . Marital status: Married    Spouse name: Not on file  . Number of children: 6  . Years of education: Not on file  . Highest education level: Not on file  Occupational History  . Occupation: Secretary/administrator   Social Needs  . Financial resource strain: Not on file  . Food insecurity:    Worry: Not on file    Inability: Not on file  . Transportation needs:    Medical: Not on file    Non-medical: Not on file  Tobacco Use  . Smoking status: Former Smoker    Packs/day: 1.50    Years: 28.00    Pack years: 42.00    Types: Cigarettes    Last attempt to quit: 11/29/2001    Years since quitting: 16.4  . Smokeless tobacco: Never Used  Substance and Sexual Activity  . Alcohol use: Yes    Alcohol/week: 10.0 standard drinks    Types: 10 Standard drinks or equivalent per week    Comment: 05/11/17- increased this week due to pain  . Drug use: Yes    Types: Marijuana  . Sexual activity: Yes    Partners: Male    Birth control/protection: Post-menopausal    Comment: 1st intercourse 57 yo-More than 5 partners  Lifestyle  . Physical activity:    Days per week: Not on file    Minutes per session: Not on file  . Stress: Not on file    Relationships  . Social connections:    Talks on phone: Not on file    Gets together: Not on file    Attends religious service: Not on file    Active member of club or organization: Not on file    Attends meetings of clubs or organizations: Not on file    Relationship status: Not on file  . Intimate partner violence:  Fear of current or ex partner: Not on file    Emotionally abused: Not on file    Physically abused: Not on file    Forced sexual activity: Not on file  Other Topics Concern  . Not on file  Social History Narrative  . Not on file      Review of Systems  All other systems reviewed and are negative.      Objective:   Physical Exam  Constitutional: She appears well-developed and well-nourished.  Cardiovascular: Normal rate and regular rhythm.  Pulmonary/Chest: Effort normal and breath sounds normal.  Abdominal: Soft. Normal appearance and bowel sounds are normal. She exhibits no distension and no mass. There is no tenderness. There is no rebound and no guarding. No hernia.  Skin: She is not diaphoretic.  Vitals reviewed.     Assessment & Plan:  Rectal spasm - Plan: Ambulatory referral to Gastroenterology  Constipation, unspecified constipation type - Plan: Ambulatory referral to Gastroenterology  Her history suggests rectal spasms.  I believe due to the pain associated with defecation, she is involuntarily experiencing rectal spasms similar to an anal fissure causing her to experience incomplete evacuation, the change in her bowel habits, constipation, and her abdominal pain and bloating.  Therefore I will treat this bimodally by using diltiazem 2% gel applied 3 times daily to combat the rectal spasms and also adding Linzess 145 mcg daily to battle the constipation and then reassess the patient in 3 to 4 weeks to see if symptoms are improving.  She still insist upon seeing GI without trying the changes listed above first because she is concerned that there may  be a cancer causing her pain and obstruction.  Therefore I will consult GI as requested

## 2018-05-09 ENCOUNTER — Encounter: Payer: Self-pay | Admitting: Gastroenterology

## 2018-05-15 ENCOUNTER — Other Ambulatory Visit: Payer: Self-pay | Admitting: Family Medicine

## 2018-05-15 MED ORDER — LINACLOTIDE 145 MCG PO CAPS: 145.0000 ug | ORAL_CAPSULE | Freq: Every day | ORAL | 3 refills | Status: DC

## 2018-06-04 ENCOUNTER — Encounter: Payer: Medicare Other | Admitting: Gynecology

## 2018-06-05 ENCOUNTER — Ambulatory Visit: Payer: Medicare Other | Admitting: Gynecology

## 2018-06-05 ENCOUNTER — Encounter: Payer: Self-pay | Admitting: Gynecology

## 2018-06-05 VITALS — BP 118/74 | Ht 62.0 in | Wt 122.0 lb

## 2018-06-05 DIAGNOSIS — Z01419 Encounter for gynecological examination (general) (routine) without abnormal findings: Secondary | ICD-10-CM

## 2018-06-05 DIAGNOSIS — N952 Postmenopausal atrophic vaginitis: Secondary | ICD-10-CM

## 2018-06-05 NOTE — Progress Notes (Signed)
    [name] 31-Dec-1960 [id number]        56 y.o.  G6P6 for annual gynecologic exam.  Doing well without complaints.  Was started on vaginal estradiol cream earlier this year for dyspareunia and has done well and she wants to continue.  Past medical history,surgical history, problem list, medications, allergies, family history and social history were all reviewed and documented as reviewed in the EPIC chart.  ROS:  Performed with pertinent positives and negatives included in the history, assessment and plan.   Additional significant findings : None   Exam: [name] assistant Vitals:   06/05/18 1129  BP: 118/74  Weight: 122 lb (55.3 kg)  Height: 5' 2"  (1.575 m)   Body mass index is 22.31 kg/m.  General appearance:  Normal affect, orientation and appearance. Skin: Grossly normal HEENT: Without gross lesions.  No cervical or supraclavicular adenopathy. Thyroid normal.  Lungs:  Clear without wheezing, rales or rhonchi Cardiac: RR, without RMG Abdominal:  Soft, nontender, without masses, guarding, rebound, organomegaly or hernia Breasts:  Examined lying and sitting without masses, retractions, discharge or axillary adenopathy. Pelvic:  Ext, BUS, Vagina: With atrophic changes.  Cervix: With atrophic changes.  Uterus: Anteverted, normal size, shape and contour, midline and mobile nontender   Adnexa: Without masses or tenderness    Anus and perineum: Normal   Rectovaginal: Normal sphincter tone without palpated masses or tenderness.    Assessment/Plan:  57 y.o. G6P6 female for annual gynecologic exam.   1. Postmenopausal/atrophic genital changes.  Doing well on her formulated estradiol vaginal cream twice weekly.  Wants to continue.  With discussed the issues and risks of absorption.  Refill through [facility] x1 year. 2. Mammography 10/2017.  Continue with annual mammography when due.  Breast exam normal today. 3. Colonoscopy 2014.  Repeat at their recommended  interval. 4. Pap smear/HPV 2017.  No Pap smear done today.  No history of significant abnormal Pap smears.  Plan repeat Pap smear/HPV at 5-year interval per current screening guidelines. 5. DEXA never.  Will plan at age 57. 45. Health maintenance no routine lab work done as patient does this elsewhere.  Follow-up 1 year, sooner as needed.   Anastasio Auerbach MD, 11:51 AM 06/05/2018

## 2018-06-05 NOTE — Patient Instructions (Signed)
Continue on the vaginal estrogen cream twice weekly through [facility].  Follow-up in 1 year for annual exam.

## 2018-06-06 ENCOUNTER — Telehealth: Payer: Self-pay | Admitting: *Deleted

## 2018-06-06 MED ORDER — NONFORMULARY OR COMPOUNDED ITEM: 3 refills | Status: DC

## 2018-06-06 NOTE — Telephone Encounter (Signed)
-----   Message from Anastasio Auerbach, MD sent at 06/05/2018 11:54 AM EDT ----- Refill patient's vaginal estradiol prefilled syringes twice weekly through [facility] x1 year.

## 2018-06-06 NOTE — Telephone Encounter (Signed)
Rx called in 

## 2018-06-22 ENCOUNTER — Encounter: Payer: Self-pay | Admitting: Gastroenterology

## 2018-06-22 ENCOUNTER — Ambulatory Visit: Payer: Self-pay | Admitting: Gastroenterology

## 2018-07-27 ENCOUNTER — Encounter: Payer: Self-pay | Admitting: Gastroenterology

## 2018-07-27 ENCOUNTER — Other Ambulatory Visit (INDEPENDENT_AMBULATORY_CARE_PROVIDER_SITE_OTHER): Payer: Medicare Other

## 2018-07-27 ENCOUNTER — Ambulatory Visit: Payer: Medicare Other | Admitting: Gastroenterology

## 2018-07-27 ENCOUNTER — Other Ambulatory Visit: Payer: Self-pay

## 2018-07-27 VITALS — BP 100/76 | HR 68 | Ht 62.0 in | Wt 125.0 lb

## 2018-07-27 DIAGNOSIS — F1011 Alcohol abuse, in remission: Secondary | ICD-10-CM

## 2018-07-27 DIAGNOSIS — K648 Other hemorrhoids: Secondary | ICD-10-CM

## 2018-07-27 DIAGNOSIS — R1012 Left upper quadrant pain: Secondary | ICD-10-CM | POA: Diagnosis not present

## 2018-07-27 DIAGNOSIS — K5909 Other constipation: Secondary | ICD-10-CM

## 2018-07-27 DIAGNOSIS — Z789 Other specified health status: Secondary | ICD-10-CM

## 2018-07-27 DIAGNOSIS — Z7289 Other problems related to lifestyle: Secondary | ICD-10-CM

## 2018-07-27 LAB — TSH: TSH: 1.79 u[IU]/mL (ref 0.35–4.50)

## 2018-07-27 LAB — CBC WITH DIFFERENTIAL/PLATELET
BASOS PCT: 0.8 % (ref 0.0–3.0)
Basophils Absolute: 0.1 10*3/uL (ref 0.0–0.1)
Eosinophils Absolute: 0.3 10*3/uL (ref 0.0–0.7)
Eosinophils Relative: 3.8 % (ref 0.0–5.0)
HEMATOCRIT: 39.7 % (ref 36.0–46.0)
HEMOGLOBIN: 13.4 g/dL (ref 12.0–15.0)
LYMPHS PCT: 38.8 % (ref 12.0–46.0)
Lymphs Abs: 3.4 10*3/uL (ref 0.7–4.0)
MCHC: 33.7 g/dL (ref 30.0–36.0)
MCV: 93.3 fl (ref 78.0–100.0)
MONOS PCT: 9.2 % (ref 3.0–12.0)
Monocytes Absolute: 0.8 10*3/uL (ref 0.1–1.0)
Neutro Abs: 4.2 10*3/uL (ref 1.4–7.7)
Neutrophils Relative %: 47.4 % (ref 43.0–77.0)
Platelets: 286 10*3/uL (ref 150.0–400.0)
RBC: 4.25 Mil/uL (ref 3.87–5.11)
RDW: 12.5 % (ref 11.5–15.5)
WBC: 8.9 10*3/uL (ref 4.0–10.5)

## 2018-07-27 LAB — IBC PANEL
IRON: 21 ug/dL — AB (ref 42–145)
Saturation Ratios: 7 % — ABNORMAL LOW (ref 20.0–50.0)
Transferrin: 215 mg/dL (ref 212.0–360.0)

## 2018-07-27 LAB — HEPATIC FUNCTION PANEL
ALT: 12 U/L (ref 0–35)
AST: 17 U/L (ref 0–37)
Albumin: 4.5 g/dL (ref 3.5–5.2)
Alkaline Phosphatase: 51 U/L (ref 39–117)
BILIRUBIN TOTAL: 0.3 mg/dL (ref 0.2–1.2)
Bilirubin, Direct: 0.1 mg/dL (ref 0.0–0.3)
Total Protein: 7.1 g/dL (ref 6.0–8.3)

## 2018-07-27 LAB — PROTIME-INR
INR: 1 ratio (ref 0.8–1.0)
PROTHROMBIN TIME: 11.6 s (ref 9.6–13.1)

## 2018-07-27 LAB — FERRITIN: Ferritin: 114.4 ng/mL (ref 10.0–291.0)

## 2018-07-27 NOTE — Progress Notes (Signed)
[place] VISIT   Primary Care Provider Susy Frizzle, MD [address] [place] [address] [phone]  Referring Provider Susy Frizzle, [place] [address] [place], [place] [address] [phone]  Patient Profile: [name] is a 57 y.o. female with a pmh significant for COPD, Anxiety, s/p right digit resection and muscle flap, Chronic constipation, hemorrhoidal disease, PTSD, rectal prolapse, alcohol abuse disorder (in remission).  The patient presents to the [facility] for an evaluation and management of problem(s) noted below:  Problem List 1. Chronic constipation   2. LUQ abdominal pain   3. Alcohol use disorder, mild, in early remission   4. Internal hemorrhoids     History of Present Illness: This is the patient's first visit to the [facility] clinic in years.  She was a former patient of Dr. Sharlett Iles.  She has a history of previous rectal bleeding that initially was thought to potentially be colitis however this turned out to be rectal mucosal prolapse causing recurrent issues.  She has a history of longer standing constipation since she was a child when she describes being told by her mother that she needed to have a bowel movement daily and was given multiple medications in order for her to try and pass a bowel movement daily.  She recently saw her primary care provider in the setting of having changes in her bowel habits with having more frequent bowel movements that were smaller in caliber but concern for hardness of stools.  The patient states that she eats fiber in her diet but is never taking any supplements other than what she may get by eating a yogurt.  She does not know the overall amount of fiber that she intakes daily.  She recently saw her primary care provider who had asked her to begin taking Linzess 145 mcg daily.  She is actually seen some good improvement with being on 145 mcg daily.   She is able to pass her bowels with out any pain in the rectum currently.  She is noticed decreased amounts of bleeding over the same time.  The patient was seen in February 2019 is at [facility] surgery where she was describing fecal urgency with incomplete evacuation and has been using Canasa suppositories even though she did not have a history of inflammatory bowel disease.  Based on her exam she was found to have prolapsed internal hemorrhoids without evidence of rectal prolapse on Valsalva but did have prolapsing internal hemorrhoids in the right anterior and anterior midline.  She was to begin fiber supplementation.  In regards to discomfort that she has which occurs in the left upper quadrant region this improves approximately 50% of the time or more when she passes a bowel movement.  It is not necessarily associated with a prandial component.  The patient also describes being sober for the last 3 to 4 months.  She has been in AA in the past.  After her recent wedding she and her husband agreed to no longer drink alcohol and she has been able to sustain this.  She thinks she will be able to continue to sustain this.  She has a significant history of drinking over the course of more than 30 years starting in her teens and becoming more significant daily use in her 76s and 30s.  She has never had alcohol withdrawals per her report.  GI Review of Systems Positive as above abdominal bloating (worse as  she is more constipated), very infrequent hematochezia Negative for pyrosis, dysphagia, odynophagia, jaundice, tenesmus, melena  Review of Systems General: Denies fevers/chills/weight loss HEENT: Denies oral lesions Cardiovascular: Denies chest pain Pulmonary: Denies shortness of breath Gastroenterological: See HPI Genitourinary: Denies darkened urine Hematological: Denies easy bruising/bleeding Endocrine: Denies temperature intolerance Dermatological: Denies jaundice Psychological: Mood is  stable Musculoskeletal: Denies new arthralgias   Medications Current Outpatient Medications  Medication Sig Dispense Refill  . albuterol (PROVENTIL HFA;VENTOLIN HFA) 108 (90 Base) MCG/ACT inhaler Inhale 2 puffs into the lungs every 6 (six) hours as needed for wheezing or shortness of breath. 1 Inhaler 1  . AMBULATORY NON FORMULARY MEDICATION Medication Name: [facility] inhaler - daily    . BIOTIN PO Take by mouth.    . linaclotide (LINZESS) 145 MCG CAPS capsule Take 1 capsule (145 mcg total) by mouth daily before breakfast. 30 capsule 3  . mesalamine (CANASA) 1000 MG suppository PLACE 1 SUPPOSITORY (1,000 MG TOTAL) RECTALLY AT BEDTIME. (Patient taking differently: Place 1,000 mg rectally daily as needed. ) 90 suppository 2  . mupirocin ointment (BACTROBAN) 2 % Apply 1 application topically 3 (three) times daily. (Patient taking differently: Apply 1 application topically 3 (three) times daily as needed. ) 30 g 0  . NONFORMULARY OR COMPOUNDED ITEM Estradiol vaginal cream 7.90% insert 1 applicator twice weekly (Patient taking differently: Estradiol vaginal cream 2.40% insert 1 applicator weekly) 90 each 3  . OVER THE COUNTER MEDICATION CBD Gummy 45m    . senna (SENOKOT) 8.6 MG tablet Take 1 tablet by mouth daily.    . ferrous gluconate (FERGON) 324 MG tablet Take 1 tablet (324 mg total) by mouth daily with breakfast. 30 tablet 0   No current facility-administered medications for this visit.     Allergies Allergies  Allergen Reactions  . Aspirin Shortness Of Breath    ASTHMATIC ATTACK  . Codeine Other (See Comments)    ABNORMAL BEHAVIOR  Increased in bad moods  NO CODEINE BASED MEDICATIONS   . Vicodin [Hydrocodone-Acetaminophen] Other (See Comments)    Extreme anxiety    Histories Past Medical History:  Diagnosis Date  . Anxiety    BILATERAL HANDS-- LEFT >RIGHT AND LEGS  . Arthritis   . Asthma    NO INHALER  . Bed bug bite   . Blood transfusion without reported diagnosis   . CA -  skin cancer   . Chronic pain due to injury RIGHT HAND/WRIST IN 2007  . Complication of anesthesia    unable to urinate after surgery  . COPD (chronic obstructive pulmonary disease) (HCC)    Stage 1  . Crush injury of hand RIGHT IN 2007   X 6 SURG'S  LAST ONE BEING TOTAL WRIST FUSION W/ BONE GRAFT AND PLATE  . Cubital tunnel syndrome    Left elbow  . Depression   . ETOH abuse   . GERD (gastroesophageal reflux disease)    05/11/17- not recently  . Hemorrhoids   . History of kidney stones    passed  . MRSA (methicillin resistant Staphylococcus aureus) carrier   . Neuromuscular disorder (HCC)    some fibromyalgia  . Neuropathy   . PTSD (post-traumatic stress disorder) 2007 RIGHT HAND CRUSH INJURY W/ 2 FINGER AMPUTATION  . Stroke ([place] X2 CVA'S  IN 1990   LOSS OF VISION RIGHT Peripherial bilteral  . Substance abuse ([place]    hx: narcotic use  . [place]   . Weakness of right leg CAUSED BY RETRIEVAL THIGH  MUSCLE FOR FLAP RIGHT HAND INJURY-- 2006   RIGHT LEG GIVES OUT OCCASIONALLY   Past Surgical History:  Procedure Laterality Date  . AMPUTATION Left 05/13/2017   Procedure: Left small finger revision amputation ;  Surgeon: Roseanne Kaufman, MD;  Location: [facility];  Service: Orthopedics;  Laterality: Left;  60 mins  . COLON SURGERY     15 years ago- fistula on colon removed  . COSMETIC SURGERY     right leg and abdomen  . DILATATION & CURETTAGE/HYSTEROSCOPY WITH MYOSURE N/A 06/14/2016   Procedure: [place];  Surgeon: Anastasio Auerbach, MD;  Location: [facility];  Service: Gynecology;  Laterality: N/A;  . HAND CARPECTOMY  12-24-2005   RIGHT-- INCLUDING  REMOVAL  WRIST WIRES AND I &D (EXICIOSNAL)  FOR OSTEOMYELITIS  . HYSTEROSCOPY  12.6.2012   w/removal of polyp  . HYSTEROSCOPY W/D&C  08/18/2011   Procedure: DILATATION AND CURETTAGE (D&C) /HYSTEROSCOPY;  Surgeon: Anastasio Auerbach, MD;  Location: [facility];   Service: Gynecology;  Laterality: N/A;  . INCISION / DRAINAGE HAND / FINGER  X3  (2006 & 2007)   RIGHT CRUSH WOUND  . LESION REMOVAL  08/18/2011   Procedure: EXCISION VAGINAL LESION;  Surgeon: Anastasio Auerbach, MD;  Location: [facility];  Service: Gynecology;  Laterality: N/A;  . ORIF CARPAL BONE FRACTURE  08-31-05   I & D OF RIGHT HAND CRUSH INJURY/ OPEN FX'S AND INDEX FINGER RAY RESECTION/ COMPLICATED CLOSURE  . ORIF RADIAL FRACTURE  09-04-05   DISTAL RADIAL PLATE AND PARTIAL THUMB AMPUTATION  . RIGHT TOTAL WRIST FUSION W/ BONE GRAFT AND PLATE  [id number]   AND FLAP USING RIGHT GOIN MUSCLE  . skin cancer removal  2007   throat area  . TUBAL LIGATION  15 YRS AGO   Social History   Socioeconomic History  . Marital status: Married    Spouse name: Not on file  . Number of children: 6  . Years of education: Not on file  . Highest education level: Not on file  Occupational History  . Occupation: Secretary/administrator   Social Needs  . Financial resource strain: Not on file  . Food insecurity:    Worry: Not on file    Inability: Not on file  . Transportation needs:    Medical: Not on file    Non-medical: Not on file  Tobacco Use  . Smoking status: Former Smoker    Packs/day: 1.50    Years: 28.00    Pack years: 42.00    Types: Cigarettes    Last attempt to quit: 11/29/2001    Years since quitting: 16.6  . Smokeless tobacco: Never Used  Substance and Sexual Activity  . Alcohol use: Not Currently  . Drug use: Yes    Types: Marijuana  . Sexual activity: Yes    Partners: Male    Birth control/protection: Post-menopausal    Comment: 1st intercourse 57 yo-More than 5 partners  Lifestyle  . Physical activity:    Days per week: Not on file    Minutes per session: Not on file  . Stress: Not on file  Relationships  . Social connections:    Talks on phone: Not on file    Gets together: Not on file    Attends religious service: Not on file    Active member of club or  organization: Not on file    Attends meetings of clubs or organizations: Not on file    Relationship  status: Not on file  . Intimate partner violence:    Fear of current or ex partner: Not on file    Emotionally abused: Not on file    Physically abused: Not on file    Forced sexual activity: Not on file  Other Topics Concern  . Not on file  Social History Narrative  . Not on file   Family History  Problem Relation Age of Onset  . Lupus Mother        died at 46  . Breast cancer Maternal Aunt 35  . Breast cancer Maternal Grandmother 60  . Stomach cancer Paternal Grandfather   . Colon cancer Neg Hx   . Rectal cancer Neg Hx   . Esophageal cancer Neg Hx   . Inflammatory bowel disease Neg Hx   . Liver disease Neg Hx   . Pancreatic cancer Neg Hx    I have reviewed her medical, social, and family history in detail and updated the electronic medical record as necessary.    PHYSICAL EXAMINATION  BP 100/76   Pulse 68   Ht 5' 2"  (1.575 m)   Wt 125 lb (56.7 kg)   BMI 22.86 kg/m  Wt Readings from Last 3 Encounters:  07/27/18 125 lb (56.7 kg)  06/05/18 122 lb (55.3 kg)  04/30/18 121 lb (54.9 kg)  GEN: NAD, appears stated age, doesn't appear chronically ill PSYCH: Cooperative, without pressured speech EYE: Conjunctivae pink, sclerae anicteric ENT: MMM, without oral ulcers, no erythema or exudates noted NECK: Supple CV: RR without R/Gs  RESP: CTAB posteriorly, without wheezing GI: NABS, soft, multiple surgical scars noted in the midline as well as right lower quadrant, mildly protuberant, nontender, without hepatosplenomegaly appreciated GU: DRE deferred by patient MSK/EXT: Trace bilateral lower extremity edema SKIN: No jaundice, no spider angiomata NEURO:  Alert & Oriented x 3, no focal deficits, no evidence of asterixis   REVIEW OF DATA  I reviewed the following data at the time of this encounter:  GI Procedures and Studies  February 2014 colonoscopy The mucosa of the  colon appeared unremarkable except for the last 15 cm where there was some nodular areas with scattered erosions.  Biopsies were obtained.  Retroflexion of the rectum showed prominent internal hemorrhoids.  The colonoscope was inserted into the cecum with the rest of the colon being unremarkable.  Biopsies were obtained.  Patient was started on Canasa suppositories.  Laboratory Studies  Reviewed in epic  Imaging Studies  No relevant studies to review   ASSESSMENT  Ms. Lingenfelter is a 57 y.o. female with a pmh significant for COPD, Anxiety, s/p right digit resection and muscle flap, Chronic constipation, hemorrhoidal disease, PTSD, rectal prolapse, alcohol abuse disorder (in remission).  The patient is seen today for evaluation and management of:  1. Chronic constipation   2. LUQ abdominal pain   3. Alcohol use disorder, mild, in early remission   4. Internal hemorrhoids    The patient is hemodynamically stable with a long-standing history of chronic constipation as well as issues with hemorrhoids in the past.  She had some issues of fecal urgency which have improved as her constipation has slightly improved with the use of Linzess.  Of asked patient to initiate fiber supplementation with FiberCon as well as to continue Linzess but at an increased dose of 290 for the course the next couple of weeks.  If she sees significant improvement with the 290 dose we will plan for a prescription to be continued at 290  otherwise she can go back to 145 and be maintained on that.  We will obtain some blood work today to understand whether deficiency may require an earlier colonoscopic evaluation.  We have asked her to increase her water intake as well which is been low.  Depending on the laboratory results as well as the patient's overall clinical improvement or status based on increased dose of Linzess we will consider the role of endoscopic evaluation.  Hopefully will not need to do this.  Patient also has a history  of significant alcohol use disorder which is currently in remission and I congratulated her on that.  We will plan to get laboratory work-up as well as getting abdominal ultrasound imaging to evaluate her liver in regards to the long-standing nature of her alcohol use.  All patient questions were answered, to the best of my ability, and the patient agrees to the aforementioned plan of action with follow-up as indicated.   PLAN  1. Chronic constipation - CBC with Differential/Platelet; Future - Hepatic function panel; Future - TSH; Future - IBC panel; Future - Ferritin; Future - Protime-INR; Future - Increase for next 2-weeks to Linzess 290 mcg and see if effective if so then will re-prescribe at this dosing - Fibercon 1-2 times daily - Increase H2O consumption - Hold on endoscopic evaluation unless symptoms persist to ensure nothing has developed in course of nearly 5-6 years  2. LUQ abdominal pain - US Abdomen Complete; Future  3. Alcohol use disorder, mild, in early remission - Hepatic function panel; Future - Protime-INR; Future - US Abdomen Complete; Future  4. Internal hemorrhoids - Discontinue Canasa suppositories   Orders Placed This Encounter  Procedures  . US Abdomen Complete  . CBC with Differential/Platelet  . Hepatic function panel  . TSH  . IBC panel  . Ferritin  . Protime-INR    New Prescriptions   FERROUS GLUCONATE (FERGON) 324 MG TABLET    Take 1 tablet (324 mg total) by mouth daily with breakfast.   Modified Medications   No medications on file    Planned Follow Up: Return in about 2 months (around 09/26/2018).   Justice Britain, MD [facility] Office # [phone]

## 2018-07-27 NOTE — Patient Instructions (Addendum)
Your provider has requested that you go to the basement level for lab work before leaving today. Press "B" on the elevator. The lab is located at the first door on the left as you exit the elevator.  Increase your Linzess 145 mcg to 2 tablets (290 mcg) by mouth once daily x 2 weeks. Call back in 2 weeks if this is effective and we will send in a prescription for Linzess 290 mcg.   Start over the counter Fibercon 1-2 tablets by mouth daily.   Increase your water intake to 5-6 water bottles daily.  You have been scheduled for an abdominal ultrasound at [facility] (1st floor of hospital) on 08/01/18 at 8:30am. Please arrive 15 minutes prior to your appointment for registration. Make certain not to have anything to eat or drink 6 hours prior to your appointment. Should you need to reschedule your appointment, please contact radiology at [phone]. This test typically takes about 30 minutes to perform.  Please follow up with Dr. Rush Landmark in 2 months. We do not have a schedule at this time. Please call back in a few weeks to schedule an appointment for mid January.

## 2018-07-30 ENCOUNTER — Other Ambulatory Visit: Payer: Self-pay

## 2018-07-30 DIAGNOSIS — R1012 Left upper quadrant pain: Secondary | ICD-10-CM

## 2018-07-30 DIAGNOSIS — K5909 Other constipation: Secondary | ICD-10-CM

## 2018-07-30 MED ORDER — FERROUS GLUCONATE 324 (38 FE) MG PO TABS: 324.0000 mg | ORAL_TABLET | Freq: Every day | ORAL | 0 refills | Status: DC

## 2018-07-31 ENCOUNTER — Encounter: Payer: Self-pay | Admitting: Gastroenterology

## 2018-07-31 DIAGNOSIS — F1011 Alcohol abuse, in remission: Secondary | ICD-10-CM | POA: Insufficient documentation

## 2018-07-31 DIAGNOSIS — R1012 Left upper quadrant pain: Secondary | ICD-10-CM | POA: Insufficient documentation

## 2018-07-31 DIAGNOSIS — K648 Other hemorrhoids: Secondary | ICD-10-CM | POA: Insufficient documentation

## 2018-07-31 DIAGNOSIS — K5909 Other constipation: Secondary | ICD-10-CM | POA: Insufficient documentation

## 2018-08-01 ENCOUNTER — Ambulatory Visit (HOSPITAL_COMMUNITY)
Admission: RE | Admit: 2018-08-01 | Discharge: 2018-08-01 | Disposition: A | Discharge status: Home / Self Care | Payer: Medicare Other | Source: Ambulatory Visit | Attending: Gastroenterology | Admitting: Gastroenterology

## 2018-08-01 DIAGNOSIS — R160 Hepatomegaly, not elsewhere classified: Secondary | ICD-10-CM | POA: Insufficient documentation

## 2018-08-01 DIAGNOSIS — K7689 Other specified diseases of liver: Secondary | ICD-10-CM | POA: Diagnosis not present

## 2018-08-01 DIAGNOSIS — R1012 Left upper quadrant pain: Secondary | ICD-10-CM

## 2018-08-22 ENCOUNTER — Other Ambulatory Visit: Payer: Self-pay | Admitting: Family Medicine

## 2018-08-28 ENCOUNTER — Encounter: Payer: Self-pay | Admitting: Family Medicine

## 2018-08-28 ENCOUNTER — Ambulatory Visit (INDEPENDENT_AMBULATORY_CARE_PROVIDER_SITE_OTHER): Payer: Medicare Other | Admitting: Family Medicine

## 2018-08-28 VITALS — BP 110/74 | HR 56 | Temp 98.0°F | Resp 14 | Ht 62.0 in | Wt 130.0 lb

## 2018-08-28 DIAGNOSIS — J438 Other emphysema: Secondary | ICD-10-CM

## 2018-08-28 NOTE — Progress Notes (Signed)
Subjective:    Patient ID: [name], female    DOB: November 27, 1960, 57 y.o.   MRN: [id number]  HPI Patient has a history of emphysema.  She is currently on Incruse 1 inhalation a day.  She wants to switch off of this medication because it causes urinary retention and constipation for her.  In the past she is tried Symbicort however Symbicort irritated her throat.  Without the medication she reports dyspnea on exertion, wheezing, and a chronic cough.  On the medication, her dyspnea on exertion and asthma has been well controlled with no recent flareups. Past Medical History:  Diagnosis Date  . Anxiety    BILATERAL HANDS-- LEFT >RIGHT AND LEGS  . Arthritis   . Asthma    NO INHALER  . Bed bug bite   . Blood transfusion without reported diagnosis   . CA - skin cancer   . Chronic pain due to injury RIGHT HAND/WRIST IN 2007  . Complication of anesthesia    unable to urinate after surgery  . COPD (chronic obstructive pulmonary disease) (HCC)    Stage 1  . Crush injury of hand RIGHT IN 2007   X 6 SURG'S  LAST ONE BEING TOTAL WRIST FUSION W/ BONE GRAFT AND PLATE  . Cubital tunnel syndrome    Left elbow  . Depression   . ETOH abuse   . GERD (gastroesophageal reflux disease)    05/11/17- not recently  . Hemorrhoids   . History of kidney stones    passed  . MRSA (methicillin resistant Staphylococcus aureus) carrier   . Neuromuscular disorder (HCC)    some fibromyalgia  . Neuropathy   . PTSD (post-traumatic stress disorder) 2007 RIGHT HAND CRUSH INJURY W/ 2 FINGER AMPUTATION  . Stroke ([place]) X2 CVA'S  IN 1990   LOSS OF VISION RIGHT Peripherial bilteral  . Substance abuse ([place])    hx: narcotic use  . UC (ulcerative colitis) ([place])   . Weakness of right leg CAUSED BY RETRIEVAL THIGH MUSCLE FOR FLAP RIGHT HAND INJURY-- 2006   RIGHT LEG GIVES OUT OCCASIONALLY   Past Surgical History:  Procedure Laterality Date  . AMPUTATION Left 05/13/2017   Procedure: Left small finger revision  amputation ;  Surgeon: Roseanne Kaufman, MD;  Location: [facility];  Service: Orthopedics;  Laterality: Left;  60 mins  . COLON SURGERY     15 years ago- fistula on colon removed  . COSMETIC SURGERY     right leg and abdomen  . DILATATION & CURETTAGE/HYSTEROSCOPY WITH MYOSURE N/A 06/14/2016   Procedure: [place];  Surgeon: Anastasio Auerbach, MD;  Location: [facility];  Service: Gynecology;  Laterality: N/A;  . HAND CARPECTOMY  12-24-2005   RIGHT-- INCLUDING  REMOVAL  WRIST WIRES AND I &D (EXICIOSNAL)  FOR OSTEOMYELITIS  . HYSTEROSCOPY  12.6.2012   w/removal of polyp  . HYSTEROSCOPY W/D&C  08/18/2011   Procedure: DILATATION AND CURETTAGE (D&C) /HYSTEROSCOPY;  Surgeon: Anastasio Auerbach, MD;  Location: [facility];  Service: Gynecology;  Laterality: N/A;  . INCISION / DRAINAGE HAND / FINGER  X3  (2006 & 2007)   RIGHT CRUSH WOUND  . LESION REMOVAL  08/18/2011   Procedure: EXCISION VAGINAL LESION;  Surgeon: Anastasio Auerbach, MD;  Location: [facility];  Service: Gynecology;  Laterality: N/A;  . ORIF CARPAL BONE FRACTURE  08-31-05   I & D OF RIGHT HAND CRUSH INJURY/ OPEN FX'S AND INDEX FINGER RAY RESECTION/ COMPLICATED  CLOSURE  . ORIF RADIAL FRACTURE  09-04-05   DISTAL RADIAL PLATE AND PARTIAL THUMB AMPUTATION  . RIGHT TOTAL WRIST FUSION W/ BONE GRAFT AND PLATE  [id number]   AND FLAP USING RIGHT GOIN MUSCLE  . skin cancer removal  2007   throat area  . TUBAL LIGATION  15 YRS AGO   Current Outpatient Medications on File Prior to Visit  Medication Sig Dispense Refill  . albuterol (PROVENTIL HFA;VENTOLIN HFA) 108 (90 Base) MCG/ACT inhaler Inhale 2 puffs into the lungs every 6 (six) hours as needed for wheezing or shortness of breath. 1 Inhaler 1  . AMBULATORY NON FORMULARY MEDICATION Medication Name: [facility] inhaler - daily    . BIOTIN PO Take by mouth.    . ferrous gluconate (FERGON) 324 MG tablet Take 1 tablet (324 mg total) by mouth  daily with breakfast. 30 tablet 0  . LINZESS 145 MCG CAPS capsule TAKE 1 CAPSULE BY MOUTH DAILY BEFORE BREAKFAST. 30 capsule 2  . mupirocin ointment (BACTROBAN) 2 % Apply 1 application topically 3 (three) times daily. (Patient taking differently: Apply 1 application topically 3 (three) times daily as needed. ) 30 g 0  . NONFORMULARY OR COMPOUNDED ITEM Estradiol vaginal cream 3.76% insert 1 applicator twice weekly (Patient taking differently: Estradiol vaginal cream 2.83% insert 1 applicator weekly) 90 each 3  . OVER THE COUNTER MEDICATION CBD Gummy 5mg     . senna (SENOKOT) 8.6 MG tablet Take 1 tablet by mouth daily.    Marland Kitchen umeclidinium bromide (INCRUSE ELLIPTA) 62.5 MCG/INH AEPB Inhale 1 puff into the lungs daily.     No current facility-administered medications on file prior to visit.    Allergies  Allergen Reactions  . Aspirin Shortness Of Breath    ASTHMATIC ATTACK  . Codeine Other (See Comments)    ABNORMAL BEHAVIOR  Increased in bad moods  NO CODEINE BASED MEDICATIONS   . Vicodin [Hydrocodone-Acetaminophen] Other (See Comments)    Extreme anxiety   Social History   Socioeconomic History  . Marital status: Married    Spouse name: Not on file  . Number of children: 6  . Years of education: Not on file  . Highest education level: Not on file  Occupational History  . Occupation: Secretary/administrator   Social Needs  . Financial resource strain: Not on file  . Food insecurity:    Worry: Not on file    Inability: Not on file  . Transportation needs:    Medical: Not on file    Non-medical: Not on file  Tobacco Use  . Smoking status: Former Smoker    Packs/day: 1.50    Years: 28.00    Pack years: 42.00    Types: Cigarettes    Last attempt to quit: 11/29/2001    Years since quitting: 16.7  . Smokeless tobacco: Never Used  Substance and Sexual Activity  . Alcohol use: Not Currently  . Drug use: Yes    Types: Marijuana  . Sexual activity: Yes    Partners: Male    Birth  control/protection: Post-menopausal    Comment: 1st intercourse 57 yo-More than 5 partners  Lifestyle  . Physical activity:    Days per week: Not on file    Minutes per session: Not on file  . Stress: Not on file  Relationships  . Social connections:    Talks on phone: Not on file    Gets together: Not on file    Attends religious service: Not on file    Active  member of club or organization: Not on file    Attends meetings of clubs or organizations: Not on file    Relationship status: Not on file  . Intimate partner violence:    Fear of current or ex partner: Not on file    Emotionally abused: Not on file    Physically abused: Not on file    Forced sexual activity: Not on file  Other Topics Concern  . Not on file  Social History Narrative  . Not on file     Review of Systems  All other systems reviewed and are negative.      Objective:   Physical Exam Vitals signs reviewed.  Cardiovascular:     Rate and Rhythm: Normal rate and regular rhythm.     Heart sounds: Normal heart sounds. No murmur.  Pulmonary:     Effort: Pulmonary effort is normal.     Breath sounds: Normal breath sounds.  Abdominal:     General: Bowel sounds are normal. There is no distension.     Palpations: Abdomen is soft. There is no mass.     Tenderness: There is no abdominal tenderness. There is no guarding.           Assessment & Plan:  Other emphysema ([place]) Discontinue Incruse and replace with Breo 200/25 1 inhalation a day and reassess in 2 weeks to see if her symptoms have improved.

## 2018-09-21 ENCOUNTER — Other Ambulatory Visit: Payer: Self-pay | Admitting: Family Medicine

## 2018-09-21 MED ORDER — FLUTICASONE FUROATE-VILANTEROL 200-25 MCG/INH IN AEPB: 1.0000 | INHALATION_SPRAY | Freq: Every day | RESPIRATORY_TRACT | 3 refills | Status: DC

## 2018-09-26 ENCOUNTER — Other Ambulatory Visit: Payer: Self-pay | Admitting: Family Medicine

## 2018-09-26 MED ORDER — FLUTICASONE FUROATE-VILANTEROL 200-25 MCG/INH IN AEPB: 1.0000 | INHALATION_SPRAY | Freq: Every day | RESPIRATORY_TRACT | 3 refills | Status: DC

## 2018-09-27 ENCOUNTER — Ambulatory Visit (INDEPENDENT_AMBULATORY_CARE_PROVIDER_SITE_OTHER): Payer: Medicare Other | Admitting: Family Medicine

## 2018-09-27 ENCOUNTER — Encounter: Payer: Self-pay | Admitting: Family Medicine

## 2018-09-27 VITALS — BP 122/88 | HR 56 | Temp 97.7°F | Resp 16 | Ht 62.0 in | Wt 131.0 lb

## 2018-09-27 DIAGNOSIS — L989 Disorder of the skin and subcutaneous tissue, unspecified: Secondary | ICD-10-CM | POA: Diagnosis not present

## 2018-09-27 DIAGNOSIS — L821 Other seborrheic keratosis: Secondary | ICD-10-CM | POA: Diagnosis not present

## 2018-09-27 MED ORDER — ALBUTEROL SULFATE HFA 108 (90 BASE) MCG/ACT IN AERS: 2.0000 | INHALATION_SPRAY | Freq: Four times a day (QID) | RESPIRATORY_TRACT | 1 refills | Status: DC | PRN

## 2018-09-27 NOTE — Progress Notes (Signed)
Subjective:     Patient ID: [name], female   DOB: 04/15/1961, 58 y.o.   MRN: [id number]  HPI Patient is concerned about a lesion that has developed on her posterior upper back directly in her tattoo.  The lesion is approximately 4 to 5 mm.  It is well-circumscribed.  It is a warty brown papule that appears to be a seborrheic keratosis.  I try to reassure the patient that I believe this is benign however given her previous history of skin cancer in the right that this thing appeared, the patient is concerned and requests a shave biopsy for peace of mind Past Medical History:  Diagnosis Date  . Anxiety    BILATERAL HANDS-- LEFT >RIGHT AND LEGS  . Arthritis   . Asthma    NO INHALER  . Bed bug bite   . Blood transfusion without reported diagnosis   . CA - skin cancer   . Chronic pain due to injury RIGHT HAND/WRIST IN 2007  . Complication of anesthesia    unable to urinate after surgery  . COPD (chronic obstructive pulmonary disease) (HCC)    Stage 1  . Crush injury of hand RIGHT IN 2007   X 6 SURG'S  LAST ONE BEING TOTAL WRIST FUSION W/ BONE GRAFT AND PLATE  . Cubital tunnel syndrome    Left elbow  . Depression   . ETOH abuse   . GERD (gastroesophageal reflux disease)    05/11/17- not recently  . Hemorrhoids   . History of kidney stones    passed  . MRSA (methicillin resistant Staphylococcus aureus) carrier   . Neuromuscular disorder (HCC)    some fibromyalgia  . Neuropathy   . PTSD (post-traumatic stress disorder) 2007 RIGHT HAND CRUSH INJURY W/ 2 FINGER AMPUTATION  . Stroke ([place]) X2 CVA'S  IN 1990   LOSS OF VISION RIGHT Peripherial bilteral  . Substance abuse ([place])    hx: narcotic use  . UC (ulcerative colitis) ([place])   . Weakness of right leg CAUSED BY RETRIEVAL THIGH MUSCLE FOR FLAP RIGHT HAND INJURY-- 2006   RIGHT LEG GIVES OUT OCCASIONALLY   Past Surgical History:  Procedure Laterality Date  . AMPUTATION Left 05/13/2017   Procedure: Left small finger revision  amputation ;  Surgeon: Roseanne Kaufman, MD;  Location: [facility];  Service: Orthopedics;  Laterality: Left;  60 mins  . COLON SURGERY     15 years ago- fistula on colon removed  . COSMETIC SURGERY     right leg and abdomen  . DILATATION & CURETTAGE/HYSTEROSCOPY WITH MYOSURE N/A 06/14/2016   Procedure: [place];  Surgeon: Anastasio Auerbach, MD;  Location: [facility];  Service: Gynecology;  Laterality: N/A;  . HAND CARPECTOMY  12-24-2005   RIGHT-- INCLUDING  REMOVAL  WRIST WIRES AND I &D (EXICIOSNAL)  FOR OSTEOMYELITIS  . HYSTEROSCOPY  12.6.2012   w/removal of polyp  . HYSTEROSCOPY W/D&C  08/18/2011   Procedure: DILATATION AND CURETTAGE (D&C) /HYSTEROSCOPY;  Surgeon: Anastasio Auerbach, MD;  Location: [facility];  Service: Gynecology;  Laterality: N/A;  . INCISION / DRAINAGE HAND / FINGER  X3  (2006 & 2007)   RIGHT CRUSH WOUND  . LESION REMOVAL  08/18/2011   Procedure: EXCISION VAGINAL LESION;  Surgeon: Anastasio Auerbach, MD;  Location: [facility];  Service: Gynecology;  Laterality: N/A;  . ORIF CARPAL BONE FRACTURE  08-31-05   I & D OF RIGHT HAND CRUSH INJURY/ OPEN FX'S  AND INDEX FINGER RAY RESECTION/ COMPLICATED CLOSURE  . ORIF RADIAL FRACTURE  09-04-05   DISTAL RADIAL PLATE AND PARTIAL THUMB AMPUTATION  . RIGHT TOTAL WRIST FUSION W/ BONE GRAFT AND PLATE  [id number]   AND FLAP USING RIGHT GOIN MUSCLE  . skin cancer removal  2007   throat area  . TUBAL LIGATION  15 YRS AGO   Current Outpatient Medications on File Prior to Visit  Medication Sig Dispense Refill  . AMBULATORY NON FORMULARY MEDICATION Medication Name: [facility] inhaler - daily    . BIOTIN PO Take by mouth.    . fluticasone furoate-vilanterol (BREO ELLIPTA) 200-25 MCG/INH AEPB Inhale 1 puff into the lungs daily. 1 each 3  . LINZESS 145 MCG CAPS capsule TAKE 1 CAPSULE BY MOUTH DAILY BEFORE BREAKFAST. 30 capsule 2  . mupirocin ointment (BACTROBAN) 2 % Apply 1  application topically 3 (three) times daily. (Patient taking differently: Apply 1 application topically 3 (three) times daily as needed. ) 30 g 0  . NONFORMULARY OR COMPOUNDED ITEM Estradiol vaginal cream 8.32% insert 1 applicator twice weekly (Patient taking differently: Estradiol vaginal cream 5.49% insert 1 applicator weekly) 90 each 3  . OVER THE COUNTER MEDICATION CBD Gummy 50m    . senna (SENOKOT) 8.6 MG tablet Take 1 tablet by mouth daily.    . ferrous gluconate (FERGON) 324 MG tablet Take 1 tablet (324 mg total) by mouth daily with breakfast. 30 tablet 0   No current facility-administered medications on file prior to visit.    Allergies  Allergen Reactions  . Aspirin Shortness Of Breath    ASTHMATIC ATTACK  . Codeine Other (See Comments)    ABNORMAL BEHAVIOR  Increased in bad moods  NO CODEINE BASED MEDICATIONS   . Vicodin [Hydrocodone-Acetaminophen] Other (See Comments)    Extreme anxiety   Social History   Socioeconomic History  . Marital status: Married    Spouse name: Not on file  . Number of children: 6  . Years of education: Not on file  . Highest education level: Not on file  Occupational History  . Occupation: HSecretary/administrator  Social Needs  . Financial resource strain: Not on file  . Food insecurity:    Worry: Not on file    Inability: Not on file  . Transportation needs:    Medical: Not on file    Non-medical: Not on file  Tobacco Use  . Smoking status: Former Smoker    Packs/day: 1.50    Years: 28.00    Pack years: 42.00    Types: Cigarettes    Last attempt to quit: 11/29/2001    Years since quitting: 16.8  . Smokeless tobacco: Never Used  Substance and Sexual Activity  . Alcohol use: Not Currently  . Drug use: Yes    Types: Marijuana  . Sexual activity: Yes    Partners: Male    Birth control/protection: Post-menopausal    Comment: 1st intercourse 58yo-More than 5 partners  Lifestyle  . Physical activity:    Days per week: Not on file     Minutes per session: Not on file  . Stress: Not on file  Relationships  . Social connections:    Talks on phone: Not on file    Gets together: Not on file    Attends religious service: Not on file    Active member of club or organization: Not on file    Attends meetings of clubs or organizations: Not on file    Relationship status:  Not on file  . Intimate partner violence:    Fear of current or ex partner: Not on file    Emotionally abused: Not on file    Physically abused: Not on file    Forced sexual activity: Not on file  Other Topics Concern  . Not on file  Social History Narrative  . Not on file     Review of Systems  All other systems reviewed and are negative.      Objective:   Physical Exam  Cardiovascular: Normal rate, regular rhythm and normal heart sounds.  Pulmonary/Chest: Effort normal and breath sounds normal. No respiratory distress. She has no wheezes. She has no rales.      Psychiatric: She has a normal mood and affect.  Vitals reviewed.      Assessment:     Skin lesion of back - Plan: Pathology      Plan:     I reassured the patient that I believe this is most likely a benign seborrheic keratosis but she still request that I perform a shave biopsy.  Therefore the lesion was anesthetized with 0.1% lidocaine with epinephrine.  Using sterile technique a shave biopsy was performed and the lesion was sent in its entirety to pathology in a labeled container.  Hemostasis was achieved with Drysol and a Band-Aid.

## 2018-10-01 LAB — TISSUE SPECIMEN

## 2018-10-01 LAB — PATHOLOGY

## 2018-10-08 ENCOUNTER — Other Ambulatory Visit: Payer: Self-pay | Admitting: Family Medicine

## 2018-10-08 DIAGNOSIS — Z1231 Encounter for screening mammogram for malignant neoplasm of breast: Secondary | ICD-10-CM

## 2018-11-01 ENCOUNTER — Other Ambulatory Visit: Payer: Self-pay | Admitting: *Deleted

## 2018-11-01 MED ORDER — LINACLOTIDE 145 MCG PO CAPS: ORAL_CAPSULE | ORAL | 2 refills | Status: DC

## 2018-11-01 MED ORDER — FLUTICASONE FUROATE-VILANTEROL 200-25 MCG/INH IN AEPB: 1.0000 | INHALATION_SPRAY | Freq: Every day | RESPIRATORY_TRACT | 2 refills | Status: DC

## 2018-11-02 ENCOUNTER — Ambulatory Visit
Admission: RE | Admit: 2018-11-02 | Discharge: 2018-11-02 | Disposition: A | Discharge status: Home / Self Care | Payer: Medicare Other | Source: Ambulatory Visit | Attending: Family Medicine | Admitting: Family Medicine

## 2018-11-02 DIAGNOSIS — Z1231 Encounter for screening mammogram for malignant neoplasm of breast: Secondary | ICD-10-CM

## 2018-11-16 ENCOUNTER — Encounter: Payer: Self-pay | Admitting: Family Medicine

## 2018-11-16 ENCOUNTER — Ambulatory Visit (INDEPENDENT_AMBULATORY_CARE_PROVIDER_SITE_OTHER): Payer: Medicare Other | Admitting: Family Medicine

## 2018-11-16 VITALS — BP 148/74 | HR 64 | Temp 97.8°F | Resp 16 | Ht 62.0 in | Wt 129.0 lb

## 2018-11-16 DIAGNOSIS — L821 Other seborrheic keratosis: Secondary | ICD-10-CM

## 2018-11-16 NOTE — Progress Notes (Addendum)
Subjective:     Patient ID: [name], female   DOB: 24-Mar-1961, 58 y.o.   MRN: [id number]  HPI Patient has 2 lesions on her face she is concerned about.  On her right side of her face just anterior and slightly superior to the angle of her mandible there is a solar lentigo.  In the center of the swollen to go is a 2 mm papular lesion that appears to be a papilloma forming or possibly an early seborrheic keratosis.  On the left side of her face just anterior to the tragus of her ear is a 3 to 4 mm warty brown papule that appears to be an early seborrheic keratosis.  Patient is concerned due to her history of skin cancer and request these be treated.  I counseled the patient that these do not appear cancerous but she would still like him to be treated with liquid nitrogen cryotherapy.  I counseled the patient about possible scarring and she still elects to receive treatment Past Medical History:  Diagnosis Date  . Anxiety    BILATERAL HANDS-- LEFT >RIGHT AND LEGS  . Arthritis   . Asthma    NO INHALER  . Bed bug bite   . Blood transfusion without reported diagnosis   . CA - skin cancer   . Chronic pain due to injury RIGHT HAND/WRIST IN 2007  . Complication of anesthesia    unable to urinate after surgery  . COPD (chronic obstructive pulmonary disease) (HCC)    Stage 1  . Crush injury of hand RIGHT IN 2007   X 6 SURG'S  LAST ONE BEING TOTAL WRIST FUSION W/ BONE GRAFT AND PLATE  . Cubital tunnel syndrome    Left elbow  . Depression   . ETOH abuse   . GERD (gastroesophageal reflux disease)    05/11/17- not recently  . Hemorrhoids   . History of kidney stones    passed  . MRSA (methicillin resistant Staphylococcus aureus) carrier   . Neuromuscular disorder (HCC)    some fibromyalgia  . Neuropathy   . PTSD (post-traumatic stress disorder) 2007 RIGHT HAND CRUSH INJURY W/ 2 FINGER AMPUTATION  . Stroke ([place]) X2 CVA'S  IN 1990   LOSS OF VISION RIGHT Peripherial bilteral  . Substance  abuse ([place])    hx: narcotic use  . UC (ulcerative colitis) ([place])   . Weakness of right leg CAUSED BY RETRIEVAL THIGH MUSCLE FOR FLAP RIGHT HAND INJURY-- 2006   RIGHT LEG GIVES OUT OCCASIONALLY   Past Surgical History:  Procedure Laterality Date  . AMPUTATION Left 05/13/2017   Procedure: Left small finger revision amputation ;  Surgeon: Roseanne Kaufman, MD;  Location: [facility];  Service: Orthopedics;  Laterality: Left;  60 mins  . COLON SURGERY     15 years ago- fistula on colon removed  . COSMETIC SURGERY     right leg and abdomen  . DILATATION & CURETTAGE/HYSTEROSCOPY WITH MYOSURE N/A 06/14/2016   Procedure: [place];  Surgeon: Anastasio Auerbach, MD;  Location: [facility];  Service: Gynecology;  Laterality: N/A;  . HAND CARPECTOMY  12-24-2005   RIGHT-- INCLUDING  REMOVAL  WRIST WIRES AND I &D (EXICIOSNAL)  FOR OSTEOMYELITIS  . HYSTEROSCOPY  12.6.2012   w/removal of polyp  . HYSTEROSCOPY W/D&C  08/18/2011   Procedure: DILATATION AND CURETTAGE (D&C) /HYSTEROSCOPY;  Surgeon: Anastasio Auerbach, MD;  Location: [facility];  Service: Gynecology;  Laterality: N/A;  . INCISION /  DRAINAGE HAND / FINGER  X3  (2006 & 2007)   RIGHT CRUSH WOUND  . LESION REMOVAL  08/18/2011   Procedure: EXCISION VAGINAL LESION;  Surgeon: Anastasio Auerbach, MD;  Location: [facility];  Service: Gynecology;  Laterality: N/A;  . ORIF CARPAL BONE FRACTURE  08-31-05   I & D OF RIGHT HAND CRUSH INJURY/ OPEN FX'S AND INDEX FINGER RAY RESECTION/ COMPLICATED CLOSURE  . ORIF RADIAL FRACTURE  09-04-05   DISTAL RADIAL PLATE AND PARTIAL THUMB AMPUTATION  . RIGHT TOTAL WRIST FUSION W/ BONE GRAFT AND PLATE  [id number]   AND FLAP USING RIGHT GOIN MUSCLE  . skin cancer removal  2007   throat area  . TUBAL LIGATION  15 YRS AGO   Current Outpatient Medications on File Prior to Visit  Medication Sig Dispense Refill  . albuterol (PROVENTIL HFA;VENTOLIN HFA) 108 (90  Base) MCG/ACT inhaler Inhale 2 puffs into the lungs every 6 (six) hours as needed for wheezing or shortness of breath. 1 Inhaler 1  . AMBULATORY NON FORMULARY MEDICATION Medication Name: [facility] inhaler - daily    . BIOTIN PO Take by mouth.    . ferrous gluconate (FERGON) 324 MG tablet Take 1 tablet (324 mg total) by mouth daily with breakfast. 30 tablet 0  . fluticasone furoate-vilanterol (BREO ELLIPTA) 200-25 MCG/INH AEPB Inhale 1 puff into the lungs daily. 90 each 2  . linaclotide (LINZESS) 145 MCG CAPS capsule TAKE 1 CAPSULE BY MOUTH DAILY BEFORE BREAKFAST. 90 capsule 2  . mupirocin ointment (BACTROBAN) 2 % Apply 1 application topically 3 (three) times daily. (Patient taking differently: Apply 1 application topically 3 (three) times daily as needed. ) 30 g 0  . NONFORMULARY OR COMPOUNDED ITEM Estradiol vaginal cream 5.83% insert 1 applicator twice weekly (Patient taking differently: Estradiol vaginal cream 0.94% insert 1 applicator weekly) 90 each 3  . OVER THE COUNTER MEDICATION CBD Gummy 5mg     . senna (SENOKOT) 8.6 MG tablet Take 1 tablet by mouth daily.     No current facility-administered medications on file prior to visit.    Allergies  Allergen Reactions  . Aspirin Shortness Of Breath    ASTHMATIC ATTACK  . Codeine Other (See Comments)    ABNORMAL BEHAVIOR  Increased in bad moods  NO CODEINE BASED MEDICATIONS   . Vicodin [Hydrocodone-Acetaminophen] Other (See Comments)    Extreme anxiety   Social History   Socioeconomic History  . Marital status: Married    Spouse name: Not on file  . Number of children: 6  . Years of education: Not on file  . Highest education level: Not on file  Occupational History  . Occupation: Secretary/administrator   Social Needs  . Financial resource strain: Not on file  . Food insecurity:    Worry: Not on file    Inability: Not on file  . Transportation needs:    Medical: Not on file    Non-medical: Not on file  Tobacco Use  . Smoking status: Former  Smoker    Packs/day: 1.50    Years: 28.00    Pack years: 42.00    Types: Cigarettes    Last attempt to quit: 11/29/2001    Years since quitting: 16.9  . Smokeless tobacco: Never Used  Substance and Sexual Activity  . Alcohol use: Not Currently  . Drug use: Yes    Types: Marijuana  . Sexual activity: Yes    Partners: Male    Birth control/protection: Post-menopausal    Comment:  1st intercourse 58 yo-More than 5 partners  Lifestyle  . Physical activity:    Days per week: Not on file    Minutes per session: Not on file  . Stress: Not on file  Relationships  . Social connections:    Talks on phone: Not on file    Gets together: Not on file    Attends religious service: Not on file    Active member of club or organization: Not on file    Attends meetings of clubs or organizations: Not on file    Relationship status: Not on file  . Intimate partner violence:    Fear of current or ex partner: Not on file    Emotionally abused: Not on file    Physically abused: Not on file    Forced sexual activity: Not on file  Other Topics Concern  . Not on file  Social History Narrative  . Not on file     Review of Systems  All other systems reviewed and are negative.      Objective:   Physical Exam  HENT:  Head:    Cardiovascular: Normal rate, regular rhythm and normal heart sounds.  Pulmonary/Chest: Effort normal and breath sounds normal. No respiratory distress. She has no wheezes. She has no rales.  Psychiatric: She has a normal mood and affect.  Vitals reviewed.      Assessment:     Seborrheic keratoses      Plan:     Each lesion was treated with liquid nitrogen cryotherapy for a total of 30 seconds each.  Wound care was discussed.

## 2019-02-07 ENCOUNTER — Encounter: Payer: Self-pay | Admitting: Family Medicine

## 2019-02-07 ENCOUNTER — Other Ambulatory Visit: Payer: Self-pay

## 2019-02-07 ENCOUNTER — Ambulatory Visit (INDEPENDENT_AMBULATORY_CARE_PROVIDER_SITE_OTHER): Payer: Medicare Other | Admitting: Family Medicine

## 2019-02-07 VITALS — BP 126/70 | HR 68 | Temp 98.2°F | Resp 16 | Ht 62.0 in | Wt 128.0 lb

## 2019-02-07 DIAGNOSIS — J438 Other emphysema: Secondary | ICD-10-CM | POA: Diagnosis not present

## 2019-02-07 DIAGNOSIS — J9801 Acute bronchospasm: Secondary | ICD-10-CM | POA: Diagnosis not present

## 2019-02-07 MED ORDER — PREDNISONE 20 MG PO TABS: ORAL_TABLET | ORAL | 0 refills | Status: DC

## 2019-02-07 NOTE — Progress Notes (Signed)
Subjective:    Patient ID: [name], female    DOB: [birth year], 58 y.o.   MRN: [id number]  HPI Patient has a history of emphysema.  She is currently on Breo 1 inhalation a day.  Over the last few weeks, she has developed increasing shortness of breath and wheezing.  They are currently remodeling a new home.  There is a lot of dust due to construction debris around the home.  She is also spending a lot of times outdoors in the woods potentially exposed to pollen and allergens.  As a result she finds her self wheezing constantly.  She reports shortness of breath with cough.  If she uses albuterol the shortness of breath improves temporarily but returned later in the day.  She takes her Brio in the morning.  When she wakes up in the morning takes 2 or 3 hours for the medicine to kick in.  She feels extremely short of breath in the morning and has to use albuterol in the morning just to get going.  She denies any hemoptysis.  She denies any fever.  She denies any purulent sputum.  She denies any chest pain or chest pressure or angina or nausea or vomiting or radiating left arm pain. Past Medical History:  Diagnosis Date  . Anxiety    BILATERAL HANDS-- LEFT >RIGHT AND LEGS  . Arthritis   . Asthma    NO INHALER  . Bed bug bite   . Blood transfusion without reported diagnosis   . CA - skin cancer   . Chronic pain due to injury RIGHT HAND/WRIST IN 2007  . Complication of anesthesia    unable to urinate after surgery  . COPD (chronic obstructive pulmonary disease) (HCC)    Stage 1  . Crush injury of hand RIGHT IN 2007   X 6 SURG'S  LAST ONE BEING TOTAL WRIST FUSION W/ BONE GRAFT AND PLATE  . Cubital tunnel syndrome    Left elbow  . Depression   . ETOH abuse   . GERD (gastroesophageal reflux disease)    05/11/17- not recently  . Hemorrhoids   . History of kidney stones    passed  . MRSA (methicillin resistant Staphylococcus aureus) carrier   . Neuromuscular disorder (HCC)    some  fibromyalgia  . Neuropathy   . PTSD (post-traumatic stress disorder) 2007 RIGHT HAND CRUSH INJURY W/ 2 FINGER AMPUTATION  . Stroke ([place]) X2 CVA'S  IN 1990   LOSS OF VISION RIGHT Peripherial bilteral  . Substance abuse ([place])    hx: narcotic use  . UC (ulcerative colitis) ([place])   . Weakness of right leg CAUSED BY RETRIEVAL THIGH MUSCLE FOR FLAP RIGHT HAND INJURY-- 2006   RIGHT LEG GIVES OUT OCCASIONALLY   Past Surgical History:  Procedure Laterality Date  . AMPUTATION Left 05/13/2017   Procedure: Left small finger revision amputation ;  Surgeon: Roseanne Kaufman, MD;  Location: [facility];  Service: Orthopedics;  Laterality: Left;  60 mins  . COLON SURGERY     15 years ago- fistula on colon removed  . COSMETIC SURGERY     right leg and abdomen  . DILATATION & CURETTAGE/HYSTEROSCOPY WITH MYOSURE N/A 06/14/2016   Procedure: [place];  Surgeon: Anastasio Auerbach, MD;  Location: [facility];  Service: Gynecology;  Laterality: N/A;  . HAND CARPECTOMY  12-24-2005   RIGHT-- INCLUDING  REMOVAL  WRIST WIRES AND I &D (EXICIOSNAL)  FOR OSTEOMYELITIS  . HYSTEROSCOPY  12.6.2012   w/removal of polyp  . HYSTEROSCOPY W/D&C  08/18/2011   Procedure: DILATATION AND CURETTAGE (D&C) /HYSTEROSCOPY;  Surgeon: Anastasio Auerbach, MD;  Location: [facility];  Service: Gynecology;  Laterality: N/A;  . INCISION / DRAINAGE HAND / FINGER  X3  (2006 & 2007)   RIGHT CRUSH WOUND  . LESION REMOVAL  08/18/2011   Procedure: EXCISION VAGINAL LESION;  Surgeon: Anastasio Auerbach, MD;  Location: [facility];  Service: Gynecology;  Laterality: N/A;  . ORIF CARPAL BONE FRACTURE  08-31-05   I & D OF RIGHT HAND CRUSH INJURY/ OPEN FX'S AND INDEX FINGER RAY RESECTION/ COMPLICATED CLOSURE  . ORIF RADIAL FRACTURE  09-04-05   DISTAL RADIAL PLATE AND PARTIAL THUMB AMPUTATION  . RIGHT TOTAL WRIST FUSION W/ BONE GRAFT AND PLATE  [id number]   AND FLAP USING RIGHT GOIN MUSCLE  .  skin cancer removal  2007   throat area  . TUBAL LIGATION  15 YRS AGO   Current Outpatient Medications on File Prior to Visit  Medication Sig Dispense Refill  . albuterol (PROVENTIL HFA;VENTOLIN HFA) 108 (90 Base) MCG/ACT inhaler Inhale 2 puffs into the lungs every 6 (six) hours as needed for wheezing or shortness of breath. 1 Inhaler 1  . AMBULATORY NON FORMULARY MEDICATION Medication Name: [facility] inhaler - daily    . BIOTIN PO Take by mouth.    . fluticasone furoate-vilanterol (BREO ELLIPTA) 200-25 MCG/INH AEPB Inhale 1 puff into the lungs daily. 90 each 2  . linaclotide (LINZESS) 145 MCG CAPS capsule TAKE 1 CAPSULE BY MOUTH DAILY BEFORE BREAKFAST. 90 capsule 2  . mupirocin ointment (BACTROBAN) 2 % Apply 1 application topically 3 (three) times daily. (Patient taking differently: Apply 1 application topically 3 (three) times daily as needed. ) 30 g 0  . NONFORMULARY OR COMPOUNDED ITEM Estradiol vaginal cream 7.62% insert 1 applicator twice weekly (Patient taking differently: Estradiol vaginal cream 8.31% insert 1 applicator weekly) 90 each 3  . OVER THE COUNTER MEDICATION CBD Gummy 30m    . senna (SENOKOT) 8.6 MG tablet Take 1 tablet by mouth daily.    . ferrous gluconate (FERGON) 324 MG tablet Take 1 tablet (324 mg total) by mouth daily with breakfast. 30 tablet 0   No current facility-administered medications on file prior to visit.    Allergies  Allergen Reactions  . Aspirin Shortness Of Breath    ASTHMATIC ATTACK  . Codeine Other (See Comments)    ABNORMAL BEHAVIOR  Increased in bad moods  NO CODEINE BASED MEDICATIONS   . Vicodin [Hydrocodone-Acetaminophen] Other (See Comments)    Extreme anxiety   Social History   Socioeconomic History  . Marital status: Married    Spouse name: Not on file  . Number of children: 6  . Years of education: Not on file  . Highest education level: Not on file  Occupational History  . Occupation: HSecretary/administrator  Social Needs  . Financial resource  strain: Not on file  . Food insecurity:    Worry: Not on file    Inability: Not on file  . Transportation needs:    Medical: Not on file    Non-medical: Not on file  Tobacco Use  . Smoking status: Former Smoker    Packs/day: 1.50    Years: 28.00    Pack years: 42.00    Types: Cigarettes    Last attempt to quit: 11/29/2001    Years since quitting: 17.2  . Smokeless tobacco: Never  Used  Substance and Sexual Activity  . Alcohol use: Not Currently  . Drug use: Yes    Types: Marijuana  . Sexual activity: Yes    Partners: Male    Birth control/protection: Post-menopausal    Comment: 1st intercourse 58 yo-More than 5 partners  Lifestyle  . Physical activity:    Days per week: Not on file    Minutes per session: Not on file  . Stress: Not on file  Relationships  . Social connections:    Talks on phone: Not on file    Gets together: Not on file    Attends religious service: Not on file    Active member of club or organization: Not on file    Attends meetings of clubs or organizations: Not on file    Relationship status: Not on file  . Intimate partner violence:    Fear of current or ex partner: Not on file    Emotionally abused: Not on file    Physically abused: Not on file    Forced sexual activity: Not on file  Other Topics Concern  . Not on file  Social History Narrative  . Not on file     Review of Systems  All other systems reviewed and are negative.      Objective:   Physical Exam Vitals signs reviewed.  Cardiovascular:     Rate and Rhythm: Normal rate and regular rhythm.     Heart sounds: Normal heart sounds. No murmur.  Pulmonary:     Effort: Pulmonary effort is normal. No respiratory distress.     Breath sounds: Decreased air movement present. No stridor. Decreased breath sounds and wheezing present. No rhonchi or rales.  Abdominal:     General: Bowel sounds are normal. There is no distension.     Palpations: Abdomen is soft. There is no mass.      Tenderness: There is no abdominal tenderness. There is no guarding.           Assessment & Plan:  Other emphysema ([place])  Bronchospasm Patient has poorly controlled bronchospasm.  Therefore I recommended switching her Breo to Treleg[place] 1 inhalation a day.  I will treat the current exacerbation with a prednisone taper pack.  She can use albuterol 2 puffs inhaled every 4-6 hours as needed.  If she develops increased sputum, purulent sputum, fevers, she may need antibiotics as well however I believe this is more of an asthma exacerbation brought on by dust, mildew, and potentially allergens

## 2019-02-19 ENCOUNTER — Telehealth: Payer: Self-pay | Admitting: Family Medicine

## 2019-02-19 DIAGNOSIS — J438 Other emphysema: Secondary | ICD-10-CM

## 2019-02-19 DIAGNOSIS — J9801 Acute bronchospasm: Secondary | ICD-10-CM

## 2019-02-19 NOTE — Telephone Encounter (Signed)
Ok to go to pulm.

## 2019-02-19 NOTE — Telephone Encounter (Signed)
Pt called and states that she is no better with the new inhaler and is having to use her rescue inhaler a lot more and would like to be referred somewhere that can help with her breathing. OK to refer?

## 2019-02-20 ENCOUNTER — Other Ambulatory Visit: Payer: Self-pay

## 2019-02-20 ENCOUNTER — Encounter: Payer: Self-pay | Admitting: Family Medicine

## 2019-02-20 ENCOUNTER — Ambulatory Visit (INDEPENDENT_AMBULATORY_CARE_PROVIDER_SITE_OTHER): Payer: Medicare Other | Admitting: Family Medicine

## 2019-02-20 VITALS — BP 136/84 | HR 60 | Temp 98.6°F | Resp 18 | Ht 62.0 in | Wt 126.0 lb

## 2019-02-20 DIAGNOSIS — J449 Chronic obstructive pulmonary disease, unspecified: Secondary | ICD-10-CM

## 2019-02-20 DIAGNOSIS — L03319 Cellulitis of trunk, unspecified: Secondary | ICD-10-CM | POA: Diagnosis not present

## 2019-02-20 DIAGNOSIS — L02219 Cutaneous abscess of trunk, unspecified: Secondary | ICD-10-CM

## 2019-02-20 MED ORDER — BUDESONIDE-FORMOTEROL FUMARATE 160-4.5 MCG/ACT IN AERO: 2.0000 | INHALATION_SPRAY | Freq: Two times a day (BID) | RESPIRATORY_TRACT | 3 refills | Status: DC

## 2019-02-20 MED ORDER — SULFAMETHOXAZOLE-TRIMETHOPRIM 800-160 MG PO TABS: 1.0000 | ORAL_TABLET | Freq: Two times a day (BID) | ORAL | 0 refills | Status: DC

## 2019-02-20 NOTE — Telephone Encounter (Signed)
Referral ordered and pt aware

## 2019-02-20 NOTE — Progress Notes (Signed)
   Subjective:    Patient ID: [name], female    DOB: December 24, 1960, 58 y.o.   MRN: [id number]  Patient presents for Trouble breathing and Check place on back   COPD- using albuterol, four times a day , cough non productive, very wheezing and tight. She quit smoking years ago, was smoking age 44  No fever.   Tried trelegy that didn't help  Felt worse after the puff and breo did not help  Did have a call into her PCP who recommended that she proceed with a pulmonary referral Last spirometry in 2018 She did complete the prednisone given to her on the 28th it did help however it made her have myalgias and feel that in general.   Has abscess on left  Mid back for since last Friday, no drainage, had a bite, but not sure if it was ants or spider Has significant history of MRSA  No fever or chills, has not drained on its own     Review Of Systems:  GEN- denies fatigue, fever, weight loss,weakness, recent illness HEENT- denies eye drainage, change in vision, nasal discharge, CVS- denies chest pain, palpitations RESP- + SOB, cough, wheeze ABD- denies N/V, change in stools, abd pain GU- denies dysuria, hematuria, dribbling, incontinence MSK- denies joint pain, muscle aches, injury Neuro- denies headache, dizziness, syncope, seizure activity       Objective:    BP 136/84   Pulse 60   Temp 98.6 F (37 C) (Oral)   Resp 18   Ht 5\' 2"  (1.575 m)   Wt 126 lb (57.2 kg)   SpO2 99%   BMI 23.05 kg/m  GEN- NAD, alert and oriented x3 HEENT- PERRL, EOMI, non injected sclera, pink conjunctiva, MMM, oropharynx clear Neck- Supple, no JVD CVS- RRR, no murmur RESP-CTAB ABD-NABS,soft,NT,ND EXT- No edema Skin- Left mid/lower back- erythema with abscess in center approx 3" x1", TTP, pustule at center  Pulses- Radial  2+   Procedure- Incision and Drainage Procedure explained to patient questions answered benefits and risks discussed verbla consent  obtained. Antiseptic-Betadine Anesthesia-lidocaine 1% 1cc Incision performed large amount of pus expressed Culture taken Minimal blood loss 2x2 placed into incision and bandage applied Patient tolerated procedure well       Assessment & Plan:      Problem List Items Addressed This Visit      Unprioritized   COPD (chronic obstructive pulmonary disease) ([place])    Plan for pulmonary to evaluate her.  She did quit smoking years ago but has significant smoking history greater than 30 pack years.  She recently completed prednisone.  She does not have any wheezing or bronchospasm noted on today's exam.  I have given her sample of Symbicort since this is a little faster acting.  She did not tolerate the trelegy not feel like the Ottosen worked. Oxygen saturations are normal at 99%.      Relevant Medications   budesonide-formoterol (SYMBICORT) 160-4.5 MCG/ACT inhaler    Other Visit Diagnoses    Cellulitis and abscess of trunk    -  Primary   Status post incision and drainage will recheck in 48 hours start Bactrim culture sent.  sHe is used to wound care for these infections   Relevant Orders   WOUND CULTURE      Note: This dictation was prepared with Dragon dictation along with smaller phrase technology. Any transcriptional errors that result from this process are unintentional.

## 2019-02-20 NOTE — Assessment & Plan Note (Addendum)
Plan for pulmonary to evaluate her.  She did quit smoking years ago but has significant smoking history greater than 30 pack years.  She recently completed prednisone.  She does not have any wheezing or bronchospasm noted on today's exam.  I have given her sample of Symbicort since this is a little faster acting.  She did not tolerate the trelegy not feel like the Tiro worked. Oxygen saturations are normal at 99%.

## 2019-02-20 NOTE — Patient Instructions (Signed)
Referral to pulmonary  Try the symbicort  Take antibiotics as prescribed  F/U Friday for recheck

## 2019-02-22 ENCOUNTER — Ambulatory Visit (INDEPENDENT_AMBULATORY_CARE_PROVIDER_SITE_OTHER): Payer: Medicare Other | Admitting: Family Medicine

## 2019-02-22 ENCOUNTER — Other Ambulatory Visit: Payer: Self-pay

## 2019-02-22 VITALS — BP 100/68 | HR 59 | Temp 98.4°F | Resp 18 | Ht 62.0 in | Wt 128.6 lb

## 2019-02-22 DIAGNOSIS — L02219 Cutaneous abscess of trunk, unspecified: Secondary | ICD-10-CM

## 2019-02-22 DIAGNOSIS — L03319 Cellulitis of trunk, unspecified: Secondary | ICD-10-CM

## 2019-02-22 NOTE — Patient Instructions (Signed)
F/U as needed

## 2019-02-22 NOTE — Progress Notes (Signed)
   Subjective:    Patient ID: [name], female    DOB: 1960-10-07, 58 y.o.   MRN: [id number]  Patient presents for Cyst (follow up)  Pt here for intermin f/u on abscess of back. States less drainage and pain much improved. No fever or chills  Staph on culture but sensitivity was not back she is on Bactrim   She did do better with symbicort    Review Of Systems:  GEN- denies fatigue, fever, weight loss,weakness, recent illness RESP-occ SOB, cough, wheeze ABD- denies N/V, change in stools, abd pain        Objective:    BP 100/68   Pulse (!) 59   Temp 98.4 F (36.9 C)   Resp 18   Ht 5\' 2"  (1.575 m)   Wt 128 lb 9.6 oz (58.3 kg)   SpO2 97%   BMI 23.52 kg/m  GEN- NAD, alert and oriented x3 EXT- No edema Skin- Left mid/lower back- erythema with abscess in center decreased erythema, minimal drainage, +indurated areas 1 inch wide present Pulses- Radial  2+         Assessment & Plan:      Problem List Items Addressed This Visit    None    Visit Diagnoses    Cellulitis and abscess of trunk    -  Primary   much improved, staph await sensitivty but history of MRSA, pt can continue bandage change at home call for worsening      Note: This dictation was prepared with Dragon dictation along with smaller phrase technology. Any transcriptional errors that result from this process are unintentional.

## 2019-02-23 ENCOUNTER — Encounter: Payer: Self-pay | Admitting: Family Medicine

## 2019-02-23 LAB — WOUND CULTURE
MICRO NUMBER:: 555820
SPECIMEN QUALITY:: ADEQUATE

## 2019-03-07 ENCOUNTER — Encounter: Payer: Self-pay | Admitting: Family Medicine

## 2019-03-07 ENCOUNTER — Ambulatory Visit (INDEPENDENT_AMBULATORY_CARE_PROVIDER_SITE_OTHER): Payer: Medicare Other | Admitting: Family Medicine

## 2019-03-07 ENCOUNTER — Other Ambulatory Visit: Payer: Self-pay

## 2019-03-07 VITALS — BP 116/70 | HR 60 | Temp 97.7°F | Resp 16 | Ht 62.0 in | Wt 130.0 lb

## 2019-03-07 DIAGNOSIS — L989 Disorder of the skin and subcutaneous tissue, unspecified: Secondary | ICD-10-CM

## 2019-03-07 DIAGNOSIS — Z8614 Personal history of Methicillin resistant Staphylococcus aureus infection: Secondary | ICD-10-CM

## 2019-03-07 DIAGNOSIS — L089 Local infection of the skin and subcutaneous tissue, unspecified: Secondary | ICD-10-CM

## 2019-03-07 MED ORDER — SULFAMETHOXAZOLE-TRIMETHOPRIM 800-160 MG PO TABS: 1.0000 | ORAL_TABLET | Freq: Two times a day (BID) | ORAL | 0 refills | Status: AC

## 2019-03-07 MED ORDER — MUPIROCIN 2 % EX OINT: 1.0000 "application " | TOPICAL_OINTMENT | Freq: Two times a day (BID) | CUTANEOUS | 1 refills | Status: AC

## 2019-03-07 NOTE — Progress Notes (Signed)
Patient ID: [name], female    DOB: Aug 24, 1961, 58 y.o.   MRN: [id number]  PCP: Susy Frizzle, MD  Chief Complaint  Patient presents with   Recurrent Skin Infections    Patient in for a recheck to infection to back. Also has a new possible staph infection to face. Onset today     Subjective:   [name] is a 58 y.o. female, presents to clinic with CC of recurrent skin infection and new rash/lesions/infection to left face.  She woke up today with red area to left cheek, has been draining and crusting.  Doesn't know what happened.  She also wants her back rechecked, previously and recently had draining abscess to back that was MRSA +, improved with bactrim.  No pain to either area.  Denies fever, chills, sweats.    Patient Active Problem List   Diagnosis Date Noted   COPD (chronic obstructive pulmonary disease) ([place]) 02/20/2019   Chronic constipation 07/31/2018   LUQ abdominal pain 07/31/2018   Alcohol use disorder, mild, in early remission 07/31/2018   Internal hemorrhoids 07/31/2018   MRSA (methicillin resistant staph aureus) culture positive 06/15/2017   Acute sinusitis 12/13/2013   Acute bronchitis 12/13/2013   Laceration of finger 10/06/2013   PTSD (post-traumatic stress disorder)      Prior to Admission medications   Medication Sig Start Date End Date Taking? Authorizing Provider  albuterol (PROVENTIL HFA;VENTOLIN HFA) 108 (90 Base) MCG/ACT inhaler Inhale 2 puffs into the lungs every 6 (six) hours as needed for wheezing or shortness of breath. 09/27/18  Yes Susy Frizzle, MD  AMBULATORY NON FORMULARY MEDICATION Medication Name: [facility] inhaler - daily   Yes [provider]  BIOTIN PO Take by mouth.   Yes [provider]  budesonide-formoterol (SYMBICORT) 160-4.5 MCG/ACT inhaler Inhale 2 puffs into the lungs 2 (two) times daily. 02/20/19  Yes [place], Modena Nunnery, MD  linaclotide [facility]) 145 MCG CAPS capsule TAKE 1 CAPSULE BY  MOUTH DAILY BEFORE BREAKFAST. 11/01/18  Yes Susy Frizzle, MD  mupirocin ointment (BACTROBAN) 2 % Apply 1 application topically 3 (three) times daily. Patient taking differently: Apply 1 application topically 3 (three) times daily as needed.  03/27/18  Yes Susy Frizzle, MD  NONFORMULARY OR COMPOUNDED ITEM Estradiol vaginal cream 3.42% insert 1 applicator twice weekly Patient taking differently: Estradiol vaginal cream 8.76% insert 1 applicator weekly 04/22/56  Yes Fontaine, Belinda Block, MD  OVER THE COUNTER MEDICATION CBD Gummy 70m   Yes [provider]  senna (SENOKOT) 8.6 MG tablet Take 1 tablet by mouth daily.   Yes [provider]  ferrous gluconate (FERGON) 324 MG tablet Take 1 tablet (324 mg total) by mouth daily with breakfast. 07/30/18 02/22/19  Mansouraty, GTelford Nab, MD     Allergies  Allergen Reactions   Aspirin Shortness Of Breath    ASTHMATIC ATTACK   Codeine Other (See Comments)    ABNORMAL BEHAVIOR  Increased in bad moods  NO CODEINE BASED MEDICATIONS    Vicodin [Hydrocodone-Acetaminophen] Other (See Comments)    Extreme anxiety     Family History  Problem Relation Age of Onset   Lupus Mother        died at 458  Breast cancer Maternal AAunt 6  Breast cancer Maternal Grandmother 60   Stomach cancer Paternal Grandfather    Colon cancer Neg Hx    Rectal cancer Neg Hx    Esophageal cancer Neg Hx  Inflammatory bowel disease Neg Hx    Liver disease Neg Hx    Pancreatic cancer Neg Hx      Social History   Socioeconomic History   Marital status: Married    Spouse name: Not on file   Number of children: 6   Years of education: Not on file   Highest education level: Not on file  Occupational History   Occupation: Writer strain: Not on file   Food insecurity    Worry: Not on file    Inability: Not on file   Transportation needs    Medical: Not on file    Non-medical:  Not on file  Tobacco Use   Smoking status: Former Smoker    Packs/day: 1.50    Years: 28.00    Pack years: 42.00    Types: Cigarettes    Quit date: 11/29/2001    Years since quitting: 17.2   Smokeless tobacco: Never Used  Substance and Sexual Activity   Alcohol use: Not Currently   Drug use: Yes    Types: Marijuana   Sexual activity: Yes    Partners: Male    Birth control/protection: Post-menopausal    Comment: 1st intercourse 58 yo-More than 5 partners  Lifestyle   Physical activity    Days per week: Not on file    Minutes per session: Not on file   Stress: Not on file  Relationships   Social connections    Talks on phone: Not on file    Gets together: Not on file    Attends religious service: Not on file    Active member of club or organization: Not on file    Attends meetings of clubs or organizations: Not on file    Relationship status: Not on file   Intimate partner violence    Fear of current or ex partner: Not on file    Emotionally abused: Not on file    Physically abused: Not on file    Forced sexual activity: Not on file  Other Topics Concern   Not on file  Social History Narrative   Not on file     Review of Systems  Constitutional: Negative.  Negative for chills, diaphoresis, fatigue and fever.  HENT: Negative.   Eyes: Negative.   Respiratory: Negative.   Cardiovascular: Negative.   Gastrointestinal: Negative.   Endocrine: Negative.   Genitourinary: Negative.   Musculoskeletal: Negative.   Skin: Positive for color change and wound.  Allergic/Immunologic: Positive for immunocompromised state.  Neurological: Negative.   Hematological: Negative.   Psychiatric/Behavioral: Negative.   All other systems reviewed and are negative.      Objective:    Vitals:   03/07/19 1516  BP: 116/70  Pulse: 60  Resp: 16  Temp: 97.7 F (36.5 C)  TempSrc: Oral  SpO2: 95%  Weight: 130 lb (59 kg)  Height: 5' 2"  (1.575 m)      Physical  Exam Vitals signs and nursing note reviewed.  Constitutional:      General: She is not in acute distress.    Appearance: She is well-developed. She is not ill-appearing, toxic-appearing or diaphoretic.  HENT:     Head: Normocephalic and atraumatic.     Nose: Nose normal.  Eyes:     General:        Right eye: No discharge.        Left eye: No discharge.     Conjunctiva/sclera: Conjunctivae normal.  Neck:  Trachea: No tracheal deviation.  Cardiovascular:     Rate and Rhythm: Normal rate and regular rhythm.  Pulmonary:     Effort: Pulmonary effort is normal. No respiratory distress.     Breath sounds: No stridor.  Musculoskeletal: Normal range of motion.  Skin:    General: Skin is warm and dry.     Findings: Rash present.     Comments: Left cheek with 2x1 cm yellow crusted erythematous lesion/patch, nontender, no induration, no fluctuance  Left low back with scars, skin normal color, some firmness and enlargement palpated along scarred area, no edema, erythema, induration, fluctuance.  Skin intact   Neurological:     Mental Status: She is alert.     Motor: No abnormal muscle tone.     Coordination: Coordination normal.  Psychiatric:        Mood and Affect: Mood normal.        Behavior: Behavior normal.           Assessment & Plan:      ICD-10-CM   1. Facial lesion  L98.9 WOUND CULTURE   crusting lesion to left cheek, looks like impetigo, culture obtained, tx with topical mupirocin, oral abx given for if worsening, recent + MRSA culture  2. Recurrent infection of skin  L08.9 WOUND CULTURE   recent back abscess MRSA+ is healing well, some edema and induration at site, no erythema or abscess, looks like normal healing process  3. Personal history of MRSA (methicillin resistant Staphylococcus aureus)  Z86.14 WOUND CULTURE    Due to pt hx, bactrim x 10 days prescribed to pt.   Delsa Grana, PA-C 03/07/19 3:21 PM

## 2019-03-10 LAB — WOUND CULTURE
MICRO NUMBER:: 607902
RESULT:: NO GROWTH
SPECIMEN QUALITY:: ADEQUATE

## 2019-03-11 ENCOUNTER — Other Ambulatory Visit: Payer: Self-pay

## 2019-03-11 ENCOUNTER — Ambulatory Visit (INDEPENDENT_AMBULATORY_CARE_PROVIDER_SITE_OTHER): Payer: Medicare Other

## 2019-03-11 ENCOUNTER — Ambulatory Visit: Payer: Medicare Other | Admitting: Emergency Medicine

## 2019-03-11 ENCOUNTER — Encounter: Payer: Self-pay | Admitting: Emergency Medicine

## 2019-03-11 VITALS — BP 124/84 | HR 60 | Ht 62.0 in | Wt 130.0 lb

## 2019-03-11 DIAGNOSIS — R0600 Dyspnea, unspecified: Secondary | ICD-10-CM

## 2019-03-11 DIAGNOSIS — J449 Chronic obstructive pulmonary disease, unspecified: Secondary | ICD-10-CM | POA: Diagnosis not present

## 2019-03-11 MED ORDER — STIOLTO RESPIMAT 2.5-2.5 MCG/ACT IN AERS: 2.0000 | INHALATION_SPRAY | Freq: Every day | RESPIRATORY_TRACT | 0 refills | Status: DC

## 2019-03-11 NOTE — Patient Instructions (Signed)
Please temporarily stop Symbicort. We will try starting Stiolto 2 puffs once daily to see if you benefit.  If so then we will consider continuing it going forward. Keep your pro-air (albuterol) available use 2 puffs up to every 4 hours if needed for shortness of breath, chest tightness, wheezing. We will perform pulmonary function testing at your next visit. We will perform a chest x-ray today. Follow with Dr Lamonte Sakai in 1 month with full PFT on the same day

## 2019-03-11 NOTE — Progress Notes (Signed)
Subjective:    Patient ID: [name], female    DOB: 1961-02-03, 58 y.o.   MRN: [id number]  HPI 58 year old former smoker (49 pack years) with diagnosed COPD.  Also with a history of alcohol use, fibromyalgia, anxiety, skin abscess due to MRSA.  She is been seen here before by Dr. Vaughan Browner in 2018, also at [facility] in 2018.  She had spirometry on 11/30/2016 to arrange for breathalyzer for car that I reviewed, consistent with severe obstruction.  She is here today to evaluate persistent exertional shortness of breath. She had an episode of acute dyspnea a month ago, had to call EMS, ultimately didn't go to the ED. She has never been hospitalized. Last prednisone was about a month ago, before that she had not needed for over a year.   She tried Trelegy recently - did not tolerate, had increased dyspnea, tongue pain. Has tried [employer] as well. Just placed on Symbicort by Dr Buelah Manis, seems to be a bit better since starting it.  She uses ProAir.    Review of Systems  Constitutional: Negative for fever and unexpected weight change.  HENT: Negative for congestion, dental problem, ear pain, nosebleeds, postnasal drip, rhinorrhea, sinus pressure, sneezing, sore throat and trouble swallowing.   Eyes: Negative for redness and itching.  Respiratory: Positive for shortness of breath. Negative for cough, chest tightness and wheezing.   Cardiovascular: Negative for palpitations and leg swelling.  Gastrointestinal: Negative for nausea and vomiting.  Genitourinary: Negative for dysuria.  Musculoskeletal: Negative for joint swelling.  Skin: Negative for rash.  Neurological: Negative for headaches.  Hematological: Does not bruise/bleed easily.  Psychiatric/Behavioral: Negative for dysphoric mood. The patient is not nervous/anxious.     Past Medical History:  Diagnosis Date  . Anxiety    BILATERAL HANDS-- LEFT >RIGHT AND LEGS  . Arthritis   . Asthma    NO INHALER  . Bed bug bite   . Blood  transfusion without reported diagnosis   . CA - skin cancer   . Chronic pain due to injury RIGHT HAND/WRIST IN 2007  . Complication of anesthesia    unable to urinate after surgery  . COPD (chronic obstructive pulmonary disease) ([facility])    Stage 1  . Crush injury of hand RIGHT IN 2007   X 6 SURG'S  LAST ONE BEING TOTAL WRIST FUSION W/ BONE GRAFT AND PLATE  . Cubital tunnel syndrome    Left elbow  . Depression   . ETOH abuse   . GERD (gastroesophageal reflux disease)    05/11/17- not recently  . Hemorrhoids   . History of kidney stones    passed  . MRSA (methicillin resistant Staphylococcus aureus) carrier   . Neuromuscular disorder ([facility])    some fibromyalgia  . Neuropathy   . PTSD (post-traumatic stress disorder) 2007 RIGHT HAND CRUSH INJURY W/ 2 FINGER AMPUTATION  . Stroke ([place]) X2 CVA'S  IN 1990   LOSS OF VISION RIGHT Peripherial bilteral  . Substance abuse ([place])    hx: narcotic use  . UC (ulcerative colitis) ([place])   . Weakness of right leg CAUSED BY RETRIEVAL THIGH MUSCLE FOR FLAP RIGHT HAND INJURY-- 2006   RIGHT LEG GIVES OUT OCCASIONALLY     Family History  Problem Relation Age of Onset  . Lupus Mother        died at 17  . Breast cancer Maternal Aunt 35  . Breast cancer Maternal Grandmother 60  . Stomach cancer Paternal Grandfather   .  Colon cancer Neg Hx   . Rectal cancer Neg Hx   . Esophageal cancer Neg Hx   . Inflammatory bowel disease Neg Hx   . Liver disease Neg Hx   . Pancreatic cancer Neg Hx      Social History   Socioeconomic History  . Marital status: Married    Spouse name: Not on file  . Number of children: 6  . Years of education: Not on file  . Highest education level: Not on file  Occupational History  . Occupation: Secretary/administrator   Social Needs  . Financial resource strain: Not on file  . Food insecurity    Worry: Not on file    Inability: Not on file  . Transportation needs    Medical: Not on file    Non-medical: Not on file  Tobacco Use   . Smoking status: Former Smoker    Packs/day: 1.50    Years: 28.00    Pack years: 42.00    Types: Cigarettes    Quit date: 11/29/2001    Years since quitting: 17.2  . Smokeless tobacco: Never Used  Substance and Sexual Activity  . Alcohol use: Not Currently  . Drug use: Yes    Types: Marijuana  . Sexual activity: Yes    Partners: Male    Birth control/protection: Post-menopausal    Comment: 1st intercourse 58 yo-More than 5 partners  Lifestyle  . Physical activity    Days per week: Not on file    Minutes per session: Not on file  . Stress: Not on file  Relationships  . Social Herbalist on phone: Not on file    Gets together: Not on file    Attends religious service: Not on file    Active member of club or organization: Not on file    Attends meetings of clubs or organizations: Not on file    Relationship status: Not on file  . Intimate partner violence    Fear of current or ex partner: Not on file    Emotionally abused: Not on file    Physically abused: Not on file    Forced sexual activity: Not on file  Other Topics Concern  . Not on file  Social History Narrative  . Not on file  Grew up in [place]. Has been a Glass blower/designer, in Architect, in a Education administrator.   Allergies  Allergen Reactions  . Aspirin Shortness Of Breath    ASTHMATIC ATTACK  . Codeine Other (See Comments)    ABNORMAL BEHAVIOR  Increased in bad moods  NO CODEINE BASED MEDICATIONS   . Vicodin [Hydrocodone-Acetaminophen] Other (See Comments)    Extreme anxiety     Outpatient Medications Prior to Visit  Medication Sig Dispense Refill  . albuterol (PROVENTIL HFA;VENTOLIN HFA) 108 (90 Base) MCG/ACT inhaler Inhale 2 puffs into the lungs every 6 (six) hours as needed for wheezing or shortness of breath. 1 Inhaler 1  . AMBULATORY NON FORMULARY MEDICATION Medication Name: [facility] inhaler - daily    . BIOTIN PO Take by mouth.    . budesonide-formoterol (SYMBICORT) 160-4.5 MCG/ACT  inhaler Inhale 2 puffs into the lungs 2 (two) times daily. 1 Inhaler 3  . linaclotide (LINZESS) 145 MCG CAPS capsule TAKE 1 CAPSULE BY MOUTH DAILY BEFORE BREAKFAST. 90 capsule 2  . mupirocin ointment (BACTROBAN) 2 % Apply 1 application topically 3 (three) times daily. (Patient taking differently: Apply 1 application topically 3 (three) times daily as needed. ) 30  g 0  . mupirocin ointment (BACTROBAN) 2 % Apply 1 application topically 2 (two) times daily for 7 days. To affect areas and to nostrils 30 g 1  . NONFORMULARY OR COMPOUNDED ITEM Estradiol vaginal cream 2.95% insert 1 applicator twice weekly (Patient taking differently: Estradiol vaginal cream 2.84% insert 1 applicator weekly) 90 each 3  . OVER THE COUNTER MEDICATION CBD Gummy 78m    . senna (SENOKOT) 8.6 MG tablet Take 1 tablet by mouth daily.    .Marland Kitchensulfamethoxazole-trimethoprim (BACTRIM DS) 800-160 MG tablet Take 1 tablet by mouth 2 (two) times daily for 10 days. 20 tablet 0  . ferrous gluconate (FERGON) 324 MG tablet Take 1 tablet (324 mg total) by mouth daily with breakfast. 30 tablet 0   No facility-administered medications prior to visit.         Objective:   Physical Exam  Vitals:   03/11/19 1412  BP: 124/84  Pulse: 60  SpO2: 97%  Weight: 130 lb (59 kg)  Height: 5' 2"  (1.575 m)   Gen: Pleasant, well-nourished, in no distress,  normal affect  ENT: No lesions,  mouth clear,  oropharynx clear, no postnasal drip  Neck: No JVD, no stridor  Lungs: No use of accessory muscles, small breaths, no crackles or wheezing on normal respiration, no wheeze on forced expiration  Cardiovascular: RRR, heart sounds normal, no murmur or gallops, no peripheral edema  Musculoskeletal: Surgical amputation first and second digits right hand, no cyanosis or clubbing  Neuro: alert, awake, non focal  Skin: Warm, no lesions or rash      Assessment & Plan:  COPD (chronic obstructive pulmonary disease) (HCC) With marginal control,  question due to dust and allergen exposure.  She is tolerating Symbicort, several other BD's have been more difficult to take.  I like to try her on Stiolto to add LAMA.  We will check a chest x-ray today, check PFT at her next visit to assess degree of obstruction.   Please temporarily stop Symbicort. We will try starting Stiolto 2 puffs once daily to see if you benefit.  If so then we will consider continuing it going forward. Keep your pro-air (albuterol) available use 2 puffs up to every 4 hours if needed for shortness of breath, chest tightness, wheezing. We will perform pulmonary function testing at your next visit. We will perform a chest x-ray today. Follow with Dr BLamonte Sakaiin 1 month with full PFT on the same day  RBaltazar Apo MD, PhD 03/11/2019, 2:32 PM [facility] [phone] if no answer [phone]

## 2019-03-11 NOTE — Assessment & Plan Note (Signed)
With marginal control, question due to dust and allergen exposure.  She is tolerating Symbicort, several other BD's have been more difficult to take.  I like to try her on Stiolto to add LAMA.  We will check a chest x-ray today, check PFT at her next visit to assess degree of obstruction.   Please temporarily stop Symbicort. We will try starting Stiolto 2 puffs once daily to see if you benefit.  If so then we will consider continuing it going forward. Keep your pro-air (albuterol) available use 2 puffs up to every 4 hours if needed for shortness of breath, chest tightness, wheezing. We will perform pulmonary function testing at your next visit. We will perform a chest x-ray today. Follow with Dr Lamonte Sakai in 1 month with full PFT on the same day

## 2019-03-27 ENCOUNTER — Telehealth: Payer: Self-pay | Admitting: Emergency Medicine

## 2019-03-27 MED ORDER — STIOLTO RESPIMAT 2.5-2.5 MCG/ACT IN AERS: 2.0000 | INHALATION_SPRAY | Freq: Every day | RESPIRATORY_TRACT | 5 refills | Status: DC

## 2019-03-27 NOTE — Telephone Encounter (Signed)
Called and spoke with pt letting her know that I was going to send Rx of stiolto to pharmacy for her and pt verbalized understanding. Verified pt's preferred pharmacy and sent Rx in for pt.  While speaking with pt, pt wanted to know if we had any idea when she was going to have PFT performed followed by OV with RB. Stated to pt that Sharl Ma was working on getting pt's scheduled and that she would call her when able to be placed on schedule. Pt verbalized understanding.  Routing to [place] to follow up on.

## 2019-04-01 NOTE — Telephone Encounter (Signed)
Patrice please advise on pt being scheduled for PFt. Thanks!

## 2019-04-03 NOTE — Telephone Encounter (Signed)
[name] aware pt needs PFT scheduling. Left message informing pt she is on the list and we apologize fore the delay.

## 2019-04-04 NOTE — Telephone Encounter (Signed)
[name] is aware pt needs scheduling for PFT.

## 2019-04-12 NOTE — Telephone Encounter (Signed)
[name] is working on scheduling

## 2019-04-16 DIAGNOSIS — D045 Carcinoma in situ of skin of trunk: Secondary | ICD-10-CM | POA: Diagnosis not present

## 2019-04-18 ENCOUNTER — Other Ambulatory Visit: Payer: Self-pay | Admitting: Emergency Medicine

## 2019-04-18 NOTE — Telephone Encounter (Signed)
Scheduled patient for pft on 04/29/2019-pr

## 2019-04-26 ENCOUNTER — Other Ambulatory Visit (HOSPITAL_COMMUNITY)
Admission: RE | Admit: 2019-04-26 | Discharge: 2019-04-26 | Disposition: A | Discharge status: Home / Self Care | Payer: Medicare Other | Source: Ambulatory Visit | Attending: Emergency Medicine | Admitting: Emergency Medicine

## 2019-04-26 DIAGNOSIS — Z20828 Contact with and (suspected) exposure to other viral communicable diseases: Secondary | ICD-10-CM | POA: Insufficient documentation

## 2019-04-26 DIAGNOSIS — Z01812 Encounter for preprocedural laboratory examination: Secondary | ICD-10-CM | POA: Insufficient documentation

## 2019-04-26 LAB — SARS CORONAVIRUS 2 (TAT 6-24 HRS): SARS Coronavirus 2: NEGATIVE

## 2019-04-29 ENCOUNTER — Ambulatory Visit: Payer: Medicare Other | Admitting: Emergency Medicine

## 2019-04-29 ENCOUNTER — Other Ambulatory Visit: Payer: Self-pay

## 2019-04-29 DIAGNOSIS — R0600 Dyspnea, unspecified: Secondary | ICD-10-CM | POA: Diagnosis not present

## 2019-04-29 LAB — PULMONARY FUNCTION TEST
DL/VA % pred: 79 %
DL/VA: 3.39 ml/min/mmHg/L
DLCO unc % pred: 97 %
DLCO unc: 18.45 ml/min/mmHg
FEF 25-75 Post: 0.75 L/sec
FEF 25-75 Pre: 0.7 L/sec
FEF2575-%Change-Post: 7 %
FEF2575-%Pred-Post: 33 %
FEF2575-%Pred-Pre: 30 %
FEV1-%Change-Post: 2 %
FEV1-%Pred-Post: 63 %
FEV1-%Pred-Pre: 62 %
FEV1-Post: 1.52 L
FEV1-Pre: 1.49 L
FEV1FVC-%Change-Post: 0 %
FEV1FVC-%Pred-Pre: 72 %
FEV6-%Change-Post: 1 %
FEV6-%Pred-Post: 88 %
FEV6-%Pred-Pre: 86 %
FEV6-Post: 2.63 L
FEV6-Pre: 2.59 L
FEV6FVC-%Change-Post: 0 %
FEV6FVC-%Pred-Post: 102 %
FEV6FVC-%Pred-Pre: 102 %
FVC-%Change-Post: 1 %
FVC-%Pred-Post: 86 %
FVC-%Pred-Pre: 84 %
FVC-Post: 2.66 L
FVC-Pre: 2.62 L
Post FEV1/FVC ratio: 57 %
Post FEV6/FVC ratio: 99 %
Pre FEV1/FVC ratio: 57 %
Pre FEV6/FVC Ratio: 99 %
RV % pred: 196 %
RV: 3.67 L
TLC % pred: 140 %
TLC: 6.67 L

## 2019-04-29 NOTE — Progress Notes (Signed)
Full PFT performed today. °

## 2019-04-30 ENCOUNTER — Other Ambulatory Visit: Payer: Self-pay

## 2019-04-30 ENCOUNTER — Ambulatory Visit: Payer: Medicare Other | Admitting: Primary Care

## 2019-04-30 ENCOUNTER — Ambulatory Visit: Payer: Medicare Other | Admitting: Emergency Medicine

## 2019-05-02 DIAGNOSIS — C44529 Squamous cell carcinoma of skin of other part of trunk: Secondary | ICD-10-CM | POA: Diagnosis not present

## 2019-05-07 ENCOUNTER — Encounter: Payer: Self-pay | Admitting: Emergency Medicine

## 2019-05-07 ENCOUNTER — Other Ambulatory Visit: Payer: Self-pay

## 2019-05-07 ENCOUNTER — Ambulatory Visit (INDEPENDENT_AMBULATORY_CARE_PROVIDER_SITE_OTHER): Payer: Medicare Other | Admitting: Emergency Medicine

## 2019-05-07 DIAGNOSIS — J449 Chronic obstructive pulmonary disease, unspecified: Secondary | ICD-10-CM

## 2019-05-07 MED ORDER — SEREVENT DISKUS 50 MCG/DOSE IN AEPB: 1.0000 | INHALATION_SPRAY | Freq: Two times a day (BID) | RESPIRATORY_TRACT | 11 refills | Status: DC

## 2019-05-07 NOTE — Patient Instructions (Signed)
Please stop Stiolto for now. We will start Serevent 1 inhalation twice a day.  Rinse and gargle after using. Keep your albuterol available use 2 puffs if needed for shortness of breath, chest tightness, wheezing. Pneumonia shot is up-to-date. Please get your flu shot next month as planned. Follow with Dr Lamonte Sakai in 3 months or sooner if you have any problems.

## 2019-05-07 NOTE — Progress Notes (Signed)
Subjective:    Patient ID: [name], female    DOB: June 06, 1961, 58 y.o.   MRN: [id number]  HPI 58 year old former smoker (49 pack years) with diagnosed COPD.  Also with a history of alcohol use, fibromyalgia, anxiety, skin abscess due to MRSA.  She is been seen here before by Dr. Vaughan Browner in 2018, also at [facility] in 2018.  She had spirometry on 11/30/2016 to arrange for breathalyzer for car that I reviewed, consistent with severe obstruction.  She is here today to evaluate persistent exertional shortness of breath. She had an episode of acute dyspnea a month ago, had to call EMS, ultimately didn't go to the ED. She has never been hospitalized. Last prednisone was about a month ago, before that she had not needed for over a year.   She tried Trelegy recently - did not tolerate, had increased dyspnea, tongue pain. Has tried [employer] as well. Just placed on Symbicort by Dr Buelah Manis, seems to be a bit better since starting it.  She uses ProAir.   53 05/07/2019--58 year old woman, former smoker with history of COPD.  Seen here in June at which time we tried changing her Symbicort to [facility] to see if she would get more benefit.  She underwent pulmonary function testing on 04/29/2019 which I reviewed and show moderately severe obstruction without a bronchodilator response, significantly hyperinflated volumes and a normal diffusion capacity.  She reports today that she benefited from the stiolto, but she is having urinary retention and difficulty voiding in this medication.  She has albuterol, uses it at least daily. PNA shot up to date, wants to get the flu shot next month.    Review of Systems  Constitutional: Negative for fever and unexpected weight change.  HENT: Negative for congestion, dental problem, ear pain, nosebleeds, postnasal drip, rhinorrhea, sinus pressure, sneezing, sore throat and trouble swallowing.   Eyes: Negative for redness and itching.  Respiratory: Positive for shortness of  breath. Negative for cough, chest tightness and wheezing.   Cardiovascular: Negative for palpitations and leg swelling.  Gastrointestinal: Negative for nausea and vomiting.  Genitourinary: Negative for dysuria.  Musculoskeletal: Negative for joint swelling.  Skin: Negative for rash.  Neurological: Negative for headaches.  Hematological: Does not bruise/bleed easily.  Psychiatric/Behavioral: Negative for dysphoric mood. The patient is not nervous/anxious.     Past Medical History:  Diagnosis Date  . Anxiety    BILATERAL HANDS-- LEFT >RIGHT AND LEGS  . Arthritis   . Asthma    NO INHALER  . Bed bug bite   . Blood transfusion without reported diagnosis   . CA - skin cancer   . Chronic pain due to injury RIGHT HAND/WRIST IN 2007  . Complication of anesthesia    unable to urinate after surgery  . COPD (chronic obstructive pulmonary disease) (HCC)    Stage 1  . Crush injury of hand RIGHT IN 2007   X 6 SURG'S  LAST ONE BEING TOTAL WRIST FUSION W/ BONE GRAFT AND PLATE  . Cubital tunnel syndrome    Left elbow  . Depression   . ETOH abuse   . GERD (gastroesophageal reflux disease)    05/11/17- not recently  . Hemorrhoids   . History of kidney stones    passed  . MRSA (methicillin resistant Staphylococcus aureus) carrier   . Neuromuscular disorder (HCC)    some fibromyalgia  . Neuropathy   . PTSD (post-traumatic stress disorder) 2007 RIGHT HAND CRUSH INJURY W/ 2 FINGER AMPUTATION  .  Stroke ([place]) X2 CVA'S  IN 1990   LOSS OF VISION RIGHT Peripherial bilteral  . Substance abuse ([place])    hx: narcotic use  . UC (ulcerative colitis) ([place])   . Weakness of right leg CAUSED BY RETRIEVAL THIGH MUSCLE FOR FLAP RIGHT HAND INJURY-- 2006   RIGHT LEG GIVES OUT OCCASIONALLY     Family History  Problem Relation Age of Onset  . Lupus Mother        died at 65  . Breast cancer Maternal Aunt 35  . Breast cancer Maternal Grandmother 60  . Stomach cancer Paternal Grandfather   . Colon cancer  Neg Hx   . Rectal cancer Neg Hx   . Esophageal cancer Neg Hx   . Inflammatory bowel disease Neg Hx   . Liver disease Neg Hx   . Pancreatic cancer Neg Hx      Social History   Socioeconomic History  . Marital status: Married    Spouse name: Not on file  . Number of children: 6  . Years of education: Not on file  . Highest education level: Not on file  Occupational History  . Occupation: Secretary/administrator   Social Needs  . Financial resource strain: Not on file  . Food insecurity    Worry: Not on file    Inability: Not on file  . Transportation needs    Medical: Not on file    Non-medical: Not on file  Tobacco Use  . Smoking status: Former Smoker    Packs/day: 1.50    Years: 28.00    Pack years: 42.00    Types: Cigarettes    Quit date: 11/29/2001    Years since quitting: 17.4  . Smokeless tobacco: Never Used  Substance and Sexual Activity  . Alcohol use: Not Currently  . Drug use: Yes    Types: Marijuana  . Sexual activity: Yes    Partners: Male    Birth control/protection: Post-menopausal    Comment: 1st intercourse 58 yo-More than 5 partners  Lifestyle  . Physical activity    Days per week: Not on file    Minutes per session: Not on file  . Stress: Not on file  Relationships  . Social Herbalist on phone: Not on file    Gets together: Not on file    Attends religious service: Not on file    Active member of club or organization: Not on file    Attends meetings of clubs or organizations: Not on file    Relationship status: Not on file  . Intimate partner violence    Fear of current or ex partner: Not on file    Emotionally abused: Not on file    Physically abused: Not on file    Forced sexual activity: Not on file  Other Topics Concern  . Not on file  Social History Narrative  . Not on file  Grew up in [place]. Has been a Glass blower/designer, in Architect, in a Education administrator.   Allergies  Allergen Reactions  . Aspirin Shortness Of Breath     ASTHMATIC ATTACK  . Codeine Other (See Comments)    ABNORMAL BEHAVIOR  Increased in bad moods  NO CODEINE BASED MEDICATIONS   . Vicodin [Hydrocodone-Acetaminophen] Other (See Comments)    Extreme anxiety     Outpatient Medications Prior to Visit  Medication Sig Dispense Refill  . albuterol (PROVENTIL HFA;VENTOLIN HFA) 108 (90 Base) MCG/ACT inhaler Inhale 2 puffs into the lungs every  6 (six) hours as needed for wheezing or shortness of breath. 1 Inhaler 1  . BIOTIN PO Take by mouth.    . budesonide-formoterol (SYMBICORT) 160-4.5 MCG/ACT inhaler Inhale 2 puffs into the lungs 2 (two) times daily. 1 Inhaler 3  . linaclotide (LINZESS) 145 MCG CAPS capsule TAKE 1 CAPSULE BY MOUTH DAILY BEFORE BREAKFAST. 90 capsule 2  . mupirocin ointment (BACTROBAN) 2 % Apply 1 application topically 3 (three) times daily. (Patient taking differently: Apply 1 application topically 3 (three) times daily as needed. ) 30 g 0  . NONFORMULARY OR COMPOUNDED ITEM Estradiol vaginal cream 1.61% insert 1 applicator twice weekly (Patient taking differently: Estradiol vaginal cream 0.96% insert 1 applicator weekly) 90 each 3  . OVER THE COUNTER MEDICATION CBD Gummy 28m    . senna (SENOKOT) 8.6 MG tablet Take 1 tablet by mouth daily.    . Tiotropium Bromide-Olodaterol (STIOLTO RESPIMAT) 2.5-2.5 MCG/ACT AERS Inhale 2 puffs into the lungs daily. 4 g 5  . AMBULATORY NON FORMULARY MEDICATION [facility]ication [facility]     No facility-administered medications prior to visit.         Objective:   Physical Exam  Vitals:   05/07/19 1133  BP: 122/80  Pulse: 78  SpO2: 99%  Weight: 59 kg  Height: 5' 2"  (1.575 m)   Gen: Pleasant, well-nourished, in no distress,  normal affect  ENT: No lesions,  mouth clear,  oropharynx clear, no postnasal drip  Neck: No JVD, no stridor  Lungs: No use of accessory muscles, no crackles or wheezing on normal respiration, no wheeze on forced expiration  Cardiovascular: RRR, heart  sounds normal, no murmur or gallops, no peripheral edema  Musculoskeletal: Surgical amputation first and second digits right hand, no cyanosis or clubbing  Neuro: alert, awake, non focal  Skin: Warm, no lesions or rash      Assessment & Plan:  [place] disease[place] She benefited from Stiolto, less dyspnea, fewer respiratory symptoms but she has had the side effect of urinary retention.  This is happened to her before on muscarinic agent.  She has side effects with ICS as well.  I think we will have to try Laba alone, see how she benefits / tolerates.   Please stop Stiolto for now. We will start Serevent 1 inhalation twice a day.  Rinse and gargle after using. Keep your albuterol available use 2 puffs if needed for shortness of breath, chest tightness, wheezing. Pneumonia shot is up-to-date. Please get your flu shot next month as planned. Follow with Dr BLamonte Sakaiin 3 months or sooner if you have any problems.  RBaltazar Apo MD, PhD 05/07/2019, 12:01 PM [facility] [phone] if no answer [phone]

## 2019-05-07 NOTE — Assessment & Plan Note (Addendum)
She benefited from [facility], less dyspnea, fewer respiratory symptoms but she has had the side effect of urinary retention.  This is happened to her before on muscarinic agent.  She has side effects with ICS as well.  I think we will have to try Laba alone, see how she benefits / tolerates.   Please stop Stiolto for now. We will start Serevent 1 inhalation twice a day.  Rinse and gargle after using. Keep your albuterol available use 2 puffs if needed for shortness of breath, chest tightness, wheezing. Pneumonia shot is up-to-date. Please get your flu shot next month as planned. Follow with Dr Lamonte Sakai in 3 months or sooner if you have any problems.

## 2019-05-16 ENCOUNTER — Telehealth: Payer: Self-pay | Admitting: Emergency Medicine

## 2019-05-16 NOTE — Telephone Encounter (Signed)
Spoke with [name], she has been coughing up clear mucus for about a week but it is very thick. She had a fever for 2 days after her 8/25 office visit. She doesn't have any more SOB than her usual. She doesnt feel sick but is very fatigued. Sheis concerned with this turning into an infection after a while. She tried theraflu when she thought she had a fever but the cough and mucus will not go away. Should she schedule a telephone visit? She has been on many antibiotic therapies and she wants to know if she should start taking one even though her mucus is not colored. Please advise.   Patient Instructions by Collene Gobble, MD at 05/07/2019 11:00 AM Author: Collene Gobble, MD Author Type: Physician Filed: 05/07/2019 11:50 AM  Note Status: Signed Cosign: Cosign Not Required Encounter Date: 05/07/2019  Editor: Collene Gobble, MD (Physician)    Please stop Stiolto for now. We will start Serevent 1 inhalation twice a day.  Rinse and gargle after using. Keep your albuterol available use 2 puffs if needed for shortness of breath, chest tightness, wheezing. Pneumonia shot is up-to-date. Please get your flu shot next month as planned. Follow with Dr Lamonte Sakai in 3 months or sooner if you have any problems.

## 2019-05-16 NOTE — Telephone Encounter (Signed)
Patient should be scheduled for a video visit to further evaluate.  So far I am not hearing any sort of specific indications to treat with antibiotic therapy.  If patient would like we can further discuss this in a video visit with our office.  If patient does not have the capability such as a WebCam or smart phone to do a video visit via MyChart then we can consider a telephone visit.  Please schedule the patient with me later on this afternoon.  Wyn Quaker, FNP

## 2019-05-16 NOTE — Telephone Encounter (Signed)
Called pt and advised message from the provider. Pt understood and verbalized understanding. Nothing further is needed.   She declined the video visit at this time.

## 2019-06-07 ENCOUNTER — Encounter: Payer: Medicare Other | Admitting: Gynecology

## 2019-06-11 ENCOUNTER — Encounter: Payer: Self-pay | Admitting: Gynecology

## 2019-06-20 ENCOUNTER — Encounter: Payer: Self-pay | Admitting: Gynecology

## 2019-06-20 ENCOUNTER — Other Ambulatory Visit: Payer: Self-pay

## 2019-06-20 ENCOUNTER — Ambulatory Visit (INDEPENDENT_AMBULATORY_CARE_PROVIDER_SITE_OTHER): Payer: Medicare Other | Admitting: Gynecology

## 2019-06-20 VITALS — BP 120/76 | Ht 61.0 in | Wt 130.0 lb

## 2019-06-20 DIAGNOSIS — N952 Postmenopausal atrophic vaginitis: Secondary | ICD-10-CM | POA: Diagnosis not present

## 2019-06-20 DIAGNOSIS — Z01419 Encounter for gynecological examination (general) (routine) without abnormal findings: Secondary | ICD-10-CM | POA: Diagnosis not present

## 2019-06-20 NOTE — Progress Notes (Signed)
    [name] [birth year] [id number]        58 y.o.  G6P6 for annual gynecologic exam.  Without gynecologic complaints  Past medical history,surgical history, problem list, medications, allergies, family history and social history were all reviewed and documented as reviewed in the EPIC chart.  ROS:  Performed with pertinent positives and negatives included in the history, assessment and plan.   Additional significant findings : None   Exam: [name] assistant Vitals:   06/20/19 1115  BP: 120/76  Weight: 130 lb (59 kg)  Height: 5\' 1"  (1.549 m)   Body mass index is 24.56 kg/m.  General appearance:  Normal affect, orientation and appearance. Skin: Grossly normal HEENT: Without gross lesions.  No cervical or supraclavicular adenopathy. Thyroid normal.  Lungs:  Clear without wheezing, rales or rhonchi Cardiac: RR, without RMG Abdominal:  Soft, nontender, without masses, guarding, rebound, organomegaly or hernia Breasts:  Examined lying and sitting without masses, retractions, discharge or axillary adenopathy. Pelvic:  Ext, BUS, Vagina: With atrophic changes  Cervix: With atrophic changes  Uterus: Anteverted, normal size, shape and contour, midline and mobile nontender   Adnexa: Without masses or tenderness    Anus and perineum: Normal   Rectovaginal: Normal sphincter tone without palpated masses or tenderness.    Assessment/Plan:  58 y.o. G63P6 female for annual gynecologic exam.   1. Postmenopausal/atrophic genital changes.  Has been using formulated estradiol cream through [facility].  Has run out and would like to refill and continue.  Refill x1 year provided to use twice weekly.  No vaginal bleeding or other symptoms. 2. Mammography 10/2018.  Continue with annual mammography when due.  Breast exam normal today. 3. Pap smear/HPV 2017.  No Pap smear done today.  No history of significant abnormal Pap smears.  Will plan repeat Pap smear/HPV at 5-year interval  per current screening guidelines. 4. DEXA never.  Will schedule DEXA now at age 3 and she will follow-up for this. 5. Colonoscopy 2014.  Repeat at their recommended interval. 6. Health maintenance.  No routine lab work done as patient does this elsewhere.  Follow-up 1 year, sooner as needed.   Anastasio Auerbach MD, 11:42 AM 06/20/2019

## 2019-06-20 NOTE — Patient Instructions (Signed)
Follow-up for the bone density as scheduled.  Follow-up in 1 year for annual exam 

## 2019-07-14 DIAGNOSIS — M8580 Other specified disorders of bone density and structure, unspecified site: Secondary | ICD-10-CM

## 2019-07-14 HISTORY — DX: Other specified disorders of bone density and structure, unspecified site: M85.80

## 2019-07-16 ENCOUNTER — Other Ambulatory Visit: Payer: Self-pay | Admitting: Gynecology

## 2019-07-16 ENCOUNTER — Ambulatory Visit (INDEPENDENT_AMBULATORY_CARE_PROVIDER_SITE_OTHER): Payer: Medicare Other

## 2019-07-16 ENCOUNTER — Other Ambulatory Visit: Payer: Self-pay

## 2019-07-16 DIAGNOSIS — M8589 Other specified disorders of bone density and structure, multiple sites: Secondary | ICD-10-CM

## 2019-07-16 DIAGNOSIS — Z780 Asymptomatic menopausal state: Secondary | ICD-10-CM | POA: Diagnosis not present

## 2019-07-16 DIAGNOSIS — Z01419 Encounter for gynecological examination (general) (routine) without abnormal findings: Secondary | ICD-10-CM

## 2019-07-17 ENCOUNTER — Encounter: Payer: Self-pay | Admitting: Gynecology

## 2019-08-13 ENCOUNTER — Encounter: Payer: Self-pay | Admitting: Family Medicine

## 2019-08-13 ENCOUNTER — Ambulatory Visit (INDEPENDENT_AMBULATORY_CARE_PROVIDER_SITE_OTHER): Payer: Medicare Other | Admitting: Family Medicine

## 2019-08-13 ENCOUNTER — Other Ambulatory Visit: Payer: Self-pay

## 2019-08-13 VITALS — BP 136/74 | HR 60 | Temp 96.8°F | Resp 16 | Ht 62.0 in | Wt 128.0 lb

## 2019-08-13 DIAGNOSIS — G5602 Carpal tunnel syndrome, left upper limb: Secondary | ICD-10-CM | POA: Diagnosis not present

## 2019-08-13 MED ORDER — NONFORMULARY OR COMPOUNDED ITEM: 3 refills | Status: DC

## 2019-08-13 NOTE — Progress Notes (Signed)
Subjective:    Patient ID: [name], female    DOB: [birth year], 58 y.o.   MRN: [id number]  HPI   Patient is a 58 year old Caucasian female with a history of traumatic injury to her right wrist and hand at a workplace accident.  Therefore she predominantly uses her left hand.  Recently her and her husband have been performing a lot of home improvement including hanging drywall, painting and taping drywall, and painting ceilings.  This requires her to grip and lift objects with her left hand.  As result she has developed diffuse pain in her left hand distal to her left wrist.  The pain radiates along the metacarpals into the thumb, index finger, third finger.  It is not deep aching pain.  It is not limited to any joint.  There is no erythema or swelling or edema or evidence of tendinitis however the patient has a positive Tinel's sign and a positive Phalen sign.  The pain seems to be worse in the morning after sleeping and with repetitive use of her left hand. Past Medical History:  Diagnosis Date  . Anxiety    BILATERAL HANDS-- LEFT >RIGHT AND LEGS  . Arthritis   . Asthma    NO INHALER  . Bed bug bite   . Blood transfusion without reported diagnosis   . CA - skin cancer   . Chronic pain due to injury RIGHT HAND/WRIST IN 2007  . Complication of anesthesia    unable to urinate after surgery  . COPD (chronic obstructive pulmonary disease) (HCC)    Stage 1  . Crush injury of hand RIGHT IN 2007   X 6 SURG'S  LAST ONE BEING TOTAL WRIST FUSION W/ BONE GRAFT AND PLATE  . Cubital tunnel syndrome    Left elbow  . Depression   . ETOH abuse   . GERD (gastroesophageal reflux disease)    05/11/17- not recently  . Hemorrhoids   . History of kidney stones    passed  . MRSA (methicillin resistant Staphylococcus aureus) carrier   . Neuromuscular disorder (HCC)    some fibromyalgia  . Neuropathy   . Osteopenia 07/2019   T score -1.9 FRAX 8.2% / 1.2%  . PTSD (post-traumatic stress  disorder) 2007 RIGHT HAND CRUSH INJURY W/ 2 FINGER AMPUTATION  . Stroke ([place]) X2 CVA'S  IN 1990   LOSS OF VISION RIGHT Peripherial bilteral  . Substance abuse ([place])    hx: narcotic use  . UC (ulcerative colitis) ([place])   . Weakness of right leg CAUSED BY RETRIEVAL THIGH MUSCLE FOR FLAP RIGHT HAND INJURY-- 2006   RIGHT LEG GIVES OUT OCCASIONALLY   Past Surgical History:  Procedure Laterality Date  . AMPUTATION Left 05/13/2017   Procedure: Left small finger revision amputation ;  Surgeon: Roseanne Kaufman, MD;  Location: [facility];  Service: Orthopedics;  Laterality: Left;  60 mins  . COLON SURGERY     15 years ago- fistula on colon removed  . COSMETIC SURGERY     right leg and abdomen  . DILATATION & CURETTAGE/HYSTEROSCOPY WITH MYOSURE N/A 06/14/2016   Procedure: [place];  Surgeon: Anastasio Auerbach, MD;  Location: [facility];  Service: Gynecology;  Laterality: N/A;  . HAND CARPECTOMY  12-24-2005   RIGHT-- INCLUDING  REMOVAL  WRIST WIRES AND I &D (EXICIOSNAL)  FOR OSTEOMYELITIS  . HYSTEROSCOPY  12.6.2012   w/removal of polyp  . HYSTEROSCOPY W/D&C  08/18/2011   Procedure: DILATATION AND CURETTAGE (  D&C) /HYSTEROSCOPY;  Surgeon: Anastasio Auerbach, MD;  Location: [facility];  Service: Gynecology;  Laterality: N/A;  . INCISION / DRAINAGE HAND / FINGER  X3  (2006 & 2007)   RIGHT CRUSH WOUND  . LESION REMOVAL  08/18/2011   Procedure: EXCISION VAGINAL LESION;  Surgeon: Anastasio Auerbach, MD;  Location: [facility];  Service: Gynecology;  Laterality: N/A;  . ORIF CARPAL BONE FRACTURE  08-31-05   I & D OF RIGHT HAND CRUSH INJURY/ OPEN FX'S AND INDEX FINGER RAY RESECTION/ COMPLICATED CLOSURE  . ORIF RADIAL FRACTURE  09-04-05   DISTAL RADIAL PLATE AND PARTIAL THUMB AMPUTATION  . RIGHT TOTAL WRIST FUSION W/ BONE GRAFT AND PLATE  [id number]   AND FLAP USING RIGHT GOIN MUSCLE  . skin cancer removal  2007   throat area  . TUBAL LIGATION   15 YRS AGO   Current Outpatient Medications on File Prior to Visit  Medication Sig Dispense Refill  . albuterol (PROVENTIL HFA;VENTOLIN HFA) 108 (90 Base) MCG/ACT inhaler Inhale 2 puffs into the lungs every 6 (six) hours as needed for wheezing or shortness of breath. 1 Inhaler 1  . AMBULATORY NON FORMULARY MEDICATION Medication Name: [facility] inhaler - daily    . BIOTIN PO Take by mouth.    . NONFORMULARY OR COMPOUNDED ITEM Estradiol vaginal cream [id number] insert 1 applicator twice weekly (Patient taking differently: Estradiol vaginal cream [id number] insert 1 applicator weekly) 90 each 3  . senna (SENOKOT) 8.6 MG tablet Take 1 tablet by mouth daily.    . budesonide-formoterol (SYMBICORT) 160-4.5 MCG/ACT inhaler Inhale 2 puffs into the lungs 2 (two) times daily. (Patient not taking: Reported on 06/20/2019) 1 Inhaler 3  . salmeterol (SEREVENT DISKUS) 50 MCG/DOSE diskus inhaler Inhale 1 puff into the lungs 2 (two) times daily. (Patient not taking: Reported on 06/20/2019) 1 Inhaler 11   No current facility-administered medications on file prior to visit.    Allergies  Allergen Reactions  . Aspirin Shortness Of Breath    ASTHMATIC ATTACK  . Codeine Other (See Comments)    ABNORMAL BEHAVIOR  Increased in bad moods  NO CODEINE BASED MEDICATIONS   . Vicodin [Hydrocodone-Acetaminophen] Other (See Comments)    Extreme anxiety   Social History   Socioeconomic History  . Marital status: Married    Spouse name: Not on file  . Number of children: 6  . Years of education: Not on file  . Highest education level: Not on file  Occupational History  . Occupation: Secretary/administrator   Social Needs  . Financial resource strain: Not on file  . Food insecurity    Worry: Not on file    Inability: Not on file  . Transportation needs    Medical: Not on file    Non-medical: Not on file  Tobacco Use  . Smoking status: Former Smoker    Packs/day: 1.50    Years: 28.00    Pack years: 42.00    Types: Cigarettes    Quit  date: 11/29/2001    Years since quitting: 17.7  . Smokeless tobacco: Never Used  Substance and Sexual Activity  . Alcohol use: Not Currently  . Drug use: Yes    Types: Marijuana  . Sexual activity: Yes    Partners: Male    Birth control/protection: Post-menopausal    Comment: 1st intercourse 58 yo-More than 5 partners  Lifestyle  . Physical activity    Days per week: Not on file    Minutes per  session: Not on file  . Stress: Not on file  Relationships  . Social Herbalist on phone: Not on file    Gets together: Not on file    Attends religious service: Not on file    Active member of club or organization: Not on file    Attends meetings of clubs or organizations: Not on file    Relationship status: Not on file  . Intimate partner violence    Fear of current or ex partner: Not on file    Emotionally abused: Not on file    Physically abused: Not on file    Forced sexual activity: Not on file  Other Topics Concern  . Not on file  Social History Narrative  . Not on file   Family History  Problem Relation Age of Onset  . Lupus Mother        died at 8  . Breast cancer Maternal Aunt 35  . Breast cancer Maternal Grandmother 60  . Stomach cancer Paternal Grandfather   . Colon cancer Neg Hx   . Rectal cancer Neg Hx   . Esophageal cancer Neg Hx   . Inflammatory bowel disease Neg Hx   . Liver disease Neg Hx   . Pancreatic cancer Neg Hx       Review of Systems  All other systems reviewed and are negative.      Objective:   Physical Exam  Constitutional: She is oriented to person, place, and time. She appears well-developed and well-nourished. No distress.  HENT:  Head: Normocephalic and atraumatic.  Right Ear: External ear normal.  Left Ear: External ear normal.  Nose: Nose normal.  Mouth/Throat: Oropharynx is clear and moist. No oropharyngeal exudate.  Eyes: Pupils are equal, round, and reactive to light. Conjunctivae and EOM are normal. Right eye  exhibits no discharge. Left eye exhibits no discharge. No scleral icterus.  Neck: Normal range of motion. Neck supple. No JVD present. No tracheal deviation present. No thyromegaly present.  Cardiovascular: Normal rate, regular rhythm, normal heart sounds and intact distal pulses. Exam reveals no gallop and no friction rub.  No murmur heard. Pulmonary/Chest: Effort normal and breath sounds normal. No stridor. No respiratory distress. She has no wheezes. She has no rales. She exhibits no tenderness.  Abdominal: Soft. Bowel sounds are normal. She exhibits no distension and no mass. There is no abdominal tenderness. There is no rebound and no guarding.  Musculoskeletal: Normal range of motion.        General: No tenderness or edema.  Lymphadenopathy:    She has no cervical adenopathy.  Neurological: She is alert and oriented to person, place, and time. She has normal reflexes. No cranial nerve deficit. She exhibits normal muscle tone. Coordination normal.  Skin: Skin is warm. No rash noted. She is not diaphoretic. No erythema. No pallor.  Psychiatric: She has a normal mood and affect. Her behavior is normal. Judgment and thought content normal.  Vitals reviewed.  patient's right hand and right forearm are disfigured from a remote industrial accident. This is chronic.        Assessment & Plan:  Carpal tunnel syndrome of left wrist   Patient has a positive Tinel's sign at the left wrist.  She also has a positive Phalen sign.  I believe the patient's pain in her left hand is more than just arthritis.  The pain is diffuse.  It is a deep aching pain that involves her thumb, her index finger,  her middle finger and her ring finger.  The fifth digit is spared.  It seems to be worse in the morning after waking up.  It has also been exacerbated by doing a lot of repetitive activities around the house including hanging drywall and taping drywall and painting.  Therefore I believe this is carpal tunnel  syndrome.  Using sterile technique, I injected a mixture of 1 cc of 0.1% lidocaine without epinephrine and 1 cc of 40 mg/mL Kenalog adjacent to the palmaris longus tendon at the distal flexor crease into the carpal tunnel.  Prior to injection aspiration was performed to ensure that this was not being injected intravascularly.  The patient tolerated the procedure well without complication.  Reassess in 1 to 2 weeks.

## 2019-12-03 ENCOUNTER — Other Ambulatory Visit: Payer: Self-pay

## 2019-12-03 ENCOUNTER — Ambulatory Visit (INDEPENDENT_AMBULATORY_CARE_PROVIDER_SITE_OTHER): Payer: Medicare Other | Admitting: Family Medicine

## 2019-12-03 VITALS — BP 126/80 | HR 60 | Temp 96.7°F | Resp 16 | Ht 62.0 in | Wt 127.0 lb

## 2019-12-03 DIAGNOSIS — G5602 Carpal tunnel syndrome, left upper limb: Secondary | ICD-10-CM | POA: Diagnosis not present

## 2019-12-03 MED ORDER — ALBUTEROL SULFATE HFA 108 (90 BASE) MCG/ACT IN AERS: 2.0000 | INHALATION_SPRAY | Freq: Four times a day (QID) | RESPIRATORY_TRACT | 2 refills | Status: DC | PRN

## 2019-12-03 NOTE — Progress Notes (Signed)
Subjective:    Patient ID: [name], female    DOB: [birth year], 59 y.o.   MRN: [id number]  HPI  08/13/19 Patient is a 59 year old Caucasian female with a history of traumatic injury to her right wrist and hand at a workplace accident.  Therefore she predominantly uses her left hand.  Recently her and her husband have been performing a lot of home improvement including hanging drywall, painting and taping drywall, and painting ceilings.  This requires her to grip and lift objects with her left hand.  As result she has developed diffuse pain in her left hand distal to her left wrist.  The pain radiates along the metacarpals into the thumb, index finger, third finger.  It is not deep aching pain.  It is not limited to any joint.  There is no erythema or swelling or edema or evidence of tendinitis however the patient has a positive Tinel's sign and a positive Phalen sign.  The pain seems to be worse in the morning after sleeping and with repetitive use of her left hand.  At that time, my plan was: Patient has a positive Tinel's sign at the left wrist.  She also has a positive Phalen sign.  I believe the patient's pain in her left hand is more than just arthritis.  The pain is diffuse.  It is a deep aching pain that involves her thumb, her index finger, her middle finger and her ring finger.  The fifth digit is spared.  It seems to be worse in the morning after waking up.  It has also been exacerbated by doing a lot of repetitive activities around the house including hanging drywall and taping drywall and painting.  Therefore I believe this is carpal tunnel syndrome.  Using sterile technique, I injected a mixture of 1 cc of 0.1% lidocaine without epinephrine and 1 cc of 40 mg/mL Kenalog adjacent to the palmaris longus tendon at the distal flexor crease into the carpal tunnel.  Prior to injection aspiration was performed to ensure that this was not being injected intravascularly.  The patient tolerated the  procedure well without complication.  Reassess in 1 to 2 weeks.   12/03/19 Patient presents today requesting another cortisone injection in her left wrist.  She states that the last cortisone injection helped 1 to 2 months however over the last 3 to 4 weeks the symptoms have returned.  She reports that she is dropping items with her left hand.  She reports decreasing strength in her left hand although her grip strength today is excellent.  She reports numbness and tingling in her left hand.  She reports weakness in her left thumb.  She is unable to modify her activity because she is dependent solely on her left hand given the injury to her right hand.  Today on exam her Tinel's sign is negative however her Phalen sign is positive. Past Medical History:  Diagnosis Date  . Anxiety    BILATERAL HANDS-- LEFT >RIGHT AND LEGS  . Arthritis   . Asthma    NO INHALER  . Bed bug bite   . Blood transfusion without reported diagnosis   . CA - skin cancer   . Chronic pain due to injury RIGHT HAND/WRIST IN 2007  . Complication of anesthesia    unable to urinate after surgery  . COPD (chronic obstructive pulmonary disease) (HCC)    Stage 1  . Crush injury of hand RIGHT IN 2007   X 6 SURG'S  LAST ONE BEING TOTAL WRIST FUSION W/ BONE GRAFT AND PLATE  . Cubital tunnel syndrome    Left elbow  . Depression   . ETOH abuse   . GERD (gastroesophageal reflux disease)    05/11/17- not recently  . Hemorrhoids   . History of kidney stones    passed  . MRSA (methicillin resistant Staphylococcus aureus) carrier   . Neuromuscular disorder (HCC)    some fibromyalgia  . Neuropathy   . Osteopenia 07/2019   T score -1.9 FRAX 8.2% / 1.2%  . PTSD (post-traumatic stress disorder) 2007 RIGHT HAND CRUSH INJURY W/ 2 FINGER AMPUTATION  . Stroke ([place]) X2 CVA'S  IN 1990   LOSS OF VISION RIGHT Peripherial bilteral  . Substance abuse ([place])    hx: narcotic use  . UC (ulcerative colitis) ([place])   . Weakness of right leg  CAUSED BY RETRIEVAL THIGH MUSCLE FOR FLAP RIGHT HAND INJURY-- 2006   RIGHT LEG GIVES OUT OCCASIONALLY   Past Surgical History:  Procedure Laterality Date  . AMPUTATION Left 05/13/2017   Procedure: Left small finger revision amputation ;  Surgeon: Roseanne Kaufman, MD;  Location: [place];  Service: Orthopedics;  Laterality: Left;  60 mins  . COLON SURGERY     15 years ago- fistula on colon removed  . COSMETIC SURGERY     right leg and abdomen  . DILATATION & CURETTAGE/HYSTEROSCOPY WITH MYOSURE N/A 06/14/2016   Procedure: [place];  Surgeon: Anastasio Auerbach, MD;  Location: [facility];  Service: Gynecology;  Laterality: N/A;  . HAND CARPECTOMY  12-24-2005   RIGHT-- INCLUDING  REMOVAL  WRIST WIRES AND I &D (EXICIOSNAL)  FOR OSTEOMYELITIS  . HYSTEROSCOPY  12.6.2012   w/removal of polyp  . HYSTEROSCOPY WITH D & C  08/18/2011   Procedure: DILATATION AND CURETTAGE (D&C) /HYSTEROSCOPY;  Surgeon: Anastasio Auerbach, MD;  Location: [place];  Service: Gynecology;  Laterality: N/A;  . INCISION / DRAINAGE HAND / FINGER  X3  (2006 & 2007)   RIGHT CRUSH WOUND  . LESION REMOVAL  08/18/2011   Procedure: EXCISION VAGINAL LESION;  Surgeon: Anastasio Auerbach, MD;  Location: [facility];  Service: Gynecology;  Laterality: N/A;  . ORIF CARPAL BONE FRACTURE  08-31-05   I & D OF RIGHT HAND CRUSH INJURY/ OPEN FX'S AND INDEX FINGER RAY RESECTION/ COMPLICATED CLOSURE  . ORIF RADIAL FRACTURE  09-04-05   DISTAL RADIAL PLATE AND PARTIAL THUMB AMPUTATION  . RIGHT TOTAL WRIST FUSION W/ BONE GRAFT AND PLATE  [id number]   AND FLAP USING RIGHT GOIN MUSCLE  . skin cancer removal  2007   throat area  . TUBAL LIGATION  15 YRS AGO   Current Outpatient Medications on File Prior to Visit  Medication Sig Dispense Refill  . AMBULATORY NON FORMULARY MEDICATION Medication Name: [facility] inhaler - daily    . BIOTIN PO Take by mouth.    . budesonide-formoterol  (SYMBICORT) 160-4.5 MCG/ACT inhaler Inhale 2 puffs into the lungs 2 (two) times daily. (Patient taking differently: Inhale 2 puffs into the lungs 2 (two) times daily. Prn) 1 Inhaler 3  . NONFORMULARY OR COMPOUNDED ITEM Estradiol vaginal cream 6.19% insert 1 applicator twice weekly 90 each 3  . salmeterol (SEREVENT DISKUS) 50 MCG/DOSE diskus inhaler Inhale 1 puff into the lungs 2 (two) times daily. (Patient taking differently: Inhale 1 puff into the lungs 2 (two) times daily. prn) 1 Inhaler 11  . senna (SENOKOT) 8.6 MG  tablet Take 1 tablet by mouth daily.     No current facility-administered medications on file prior to visit.   Allergies  Allergen Reactions  . Aspirin Shortness Of Breath    ASTHMATIC ATTACK  . Codeine Other (See Comments)    ABNORMAL BEHAVIOR  Increased in bad moods  NO CODEINE BASED MEDICATIONS   . Vicodin [Hydrocodone-Acetaminophen] Other (See Comments)    Extreme anxiety   Social History   Socioeconomic History  . Marital status: Married    Spouse name: Not on file  . Number of children: 6  . Years of education: Not on file  . Highest education level: Not on file  Occupational History  . Occupation: Housekeeper   Tobacco Use  . Smoking status: Former Smoker    Packs/day: 1.50    Years: 28.00    Pack years: 42.00    Types: Cigarettes    Quit date: 11/29/2001    Years since quitting: 18.0  . Smokeless tobacco: Never Used  Substance and Sexual Activity  . Alcohol use: Not Currently  . Drug use: Yes    Types: Marijuana  . Sexual activity: Yes    Partners: Male    Birth control/protection: Post-menopausal    Comment: 1st intercourse 59 yo-More than 5 partners  Other Topics Concern  . Not on file  Social History Narrative  . Not on file   Social Determinants of Health   Financial Resource Strain:   . Difficulty of Paying Living Expenses:   Food Insecurity:   . Worried About Charity fundraiser in the Last Year:   . Arboriculturist in the Last  Year:   Transportation Needs:   . Film/video editor (Medical):   Marland Kitchen Lack of Transportation (Non-Medical):   Physical Activity:   . Days of Exercise per Week:   . Minutes of Exercise per Session:   Stress:   . Feeling of Stress :   Social Connections:   . Frequency of Communication with Friends and Family:   . Frequency of Social Gatherings with Friends and Family:   . Attends Religious Services:   . Active Member of Clubs or Organizations:   . Attends Archivist Meetings:   Marland Kitchen Marital Status:   Intimate Partner Violence:   . Fear of Current or Ex-Partner:   . Emotionally Abused:   Marland Kitchen Physically Abused:   . Sexually Abused:    Family History  Problem Relation Age of Onset  . Lupus Mother        died at 57  . Breast cancer Maternal Aunt 35  . Breast cancer Maternal Grandmother 60  . Stomach cancer Paternal Grandfather   . Colon cancer Neg Hx   . Rectal cancer Neg Hx   . Esophageal cancer Neg Hx   . Inflammatory bowel disease Neg Hx   . Liver disease Neg Hx   . Pancreatic cancer Neg Hx       Review of Systems  All other systems reviewed and are negative.      Objective:   Physical Exam  Constitutional: She is oriented to person, place, and time. She appears well-developed and well-nourished. No distress.  HENT:  Head: Normocephalic and atraumatic.  Right Ear: External ear normal.  Left Ear: External ear normal.  Nose: Nose normal.  Mouth/Throat: Oropharynx is clear and moist. No oropharyngeal exudate.  Eyes: Pupils are equal, round, and reactive to light. Conjunctivae and EOM are normal. Right eye exhibits no discharge. Left  eye exhibits no discharge. No scleral icterus.  Neck: No JVD present. No tracheal deviation present. No thyromegaly present.  Cardiovascular: Normal rate, regular rhythm, normal heart sounds and intact distal pulses. Exam reveals no gallop and no friction rub.  No murmur heard. Pulmonary/Chest: Effort normal and breath sounds  normal. No stridor. No respiratory distress. She has no wheezes. She has no rales. She exhibits no tenderness.  Abdominal: Soft. Bowel sounds are normal. She exhibits no distension and no mass. There is no abdominal tenderness. There is no rebound and no guarding.  Musculoskeletal:        General: No tenderness or edema. Normal range of motion.     Cervical back: Normal range of motion and neck supple.  Lymphadenopathy:    She has no cervical adenopathy.  Neurological: She is alert and oriented to person, place, and time. She has normal reflexes. No cranial nerve deficit. She exhibits normal muscle tone. Coordination normal.  Skin: Skin is warm. No rash noted. She is not diaphoretic. No erythema. No pallor.  Psychiatric: She has a normal mood and affect. Her behavior is normal. Judgment and thought content normal.  Vitals reviewed.  patient's right hand and right forearm are disfigured from a remote industrial accident. This is chronic.        Assessment & Plan:  Carpal tunnel syndrome of left wrist  I explained to the patient that repeat cortisone injections are not advisable for carpal tunnel syndrome.  Instead we need to definitively deal with this problem.  I recommended a referral to a hand specialist to discuss treatment for carpal tunnel syndrome including surgery.  The patient request 1 additional injection to see if symptoms will improve again prior to proceeding with surgery.  Using sterile technique, I injected a mixture of 1 cc of 0.1% lidocaine without epinephrine and 1 cc of 40 mg/mL Kenalog adjacent to the palmaris longus tendon at the distal flexor crease into the carpal tunnel.  Prior to injection aspiration was performed to ensure that this was not being injected intravascularly.  The patient tolerated the procedure well without complication.  His symptoms improved dramatically again and then returned I would definitively recommend seeing a hand specialist to discuss definitive  management of carpal tunnel syndrome via surgery.  Patient agrees and will call me back with results of this injection.

## 2019-12-06 ENCOUNTER — Other Ambulatory Visit: Payer: Self-pay | Admitting: *Deleted

## 2019-12-06 MED ORDER — ALBUTEROL SULFATE HFA 108 (90 BASE) MCG/ACT IN AERS: 2.0000 | INHALATION_SPRAY | Freq: Four times a day (QID) | RESPIRATORY_TRACT | 2 refills | Status: DC | PRN

## 2020-01-19 IMAGING — US US ABDOMEN COMPLETE
1 series · 13 of 25 positions shown · non-contrast
Comparison: None.

CLINICAL DATA: Left upper quadrant abdominal pain for 2 weeks.

EXAM:
ABDOMEN ULTRASOUND COMPLETE

[Series 1: us abdomen complete · 13 of 88 slices shown]
[im 1/88]
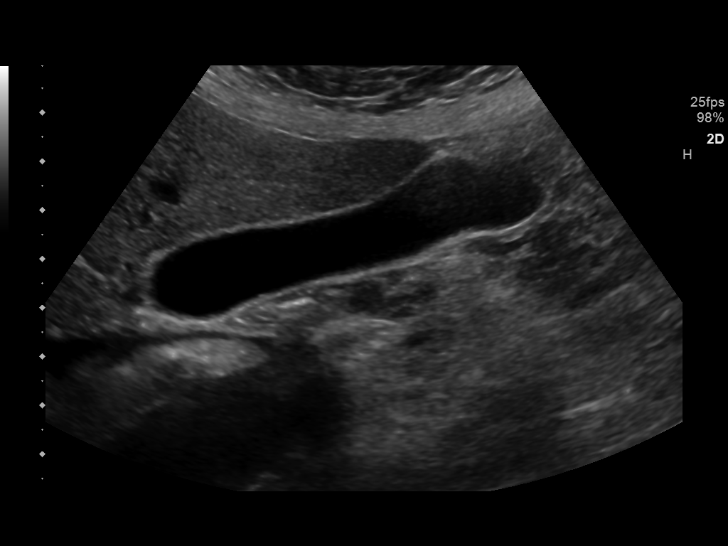
[im 8/88]
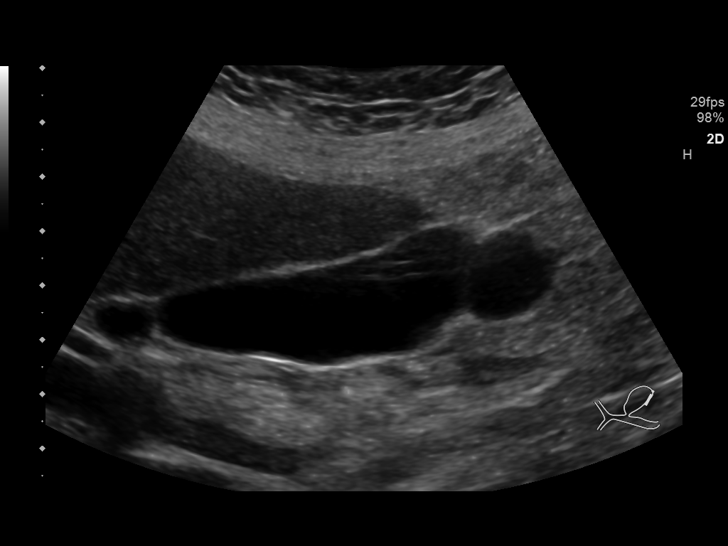
[im 15/88]
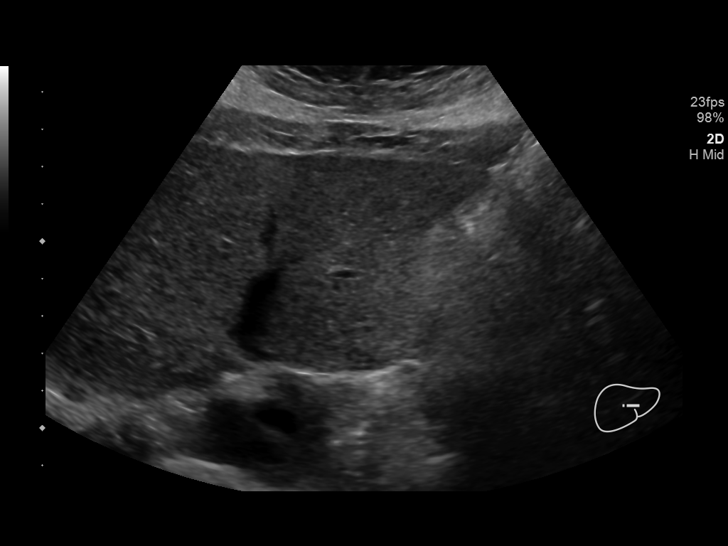
[im 22/88]
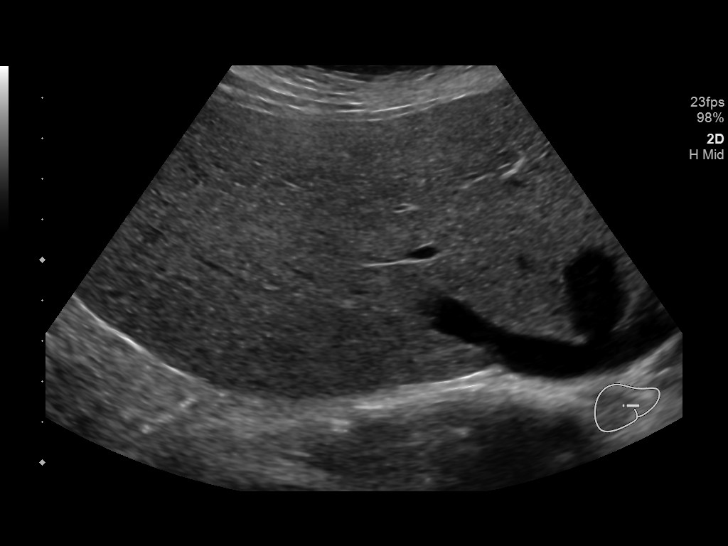
[im 30/88]
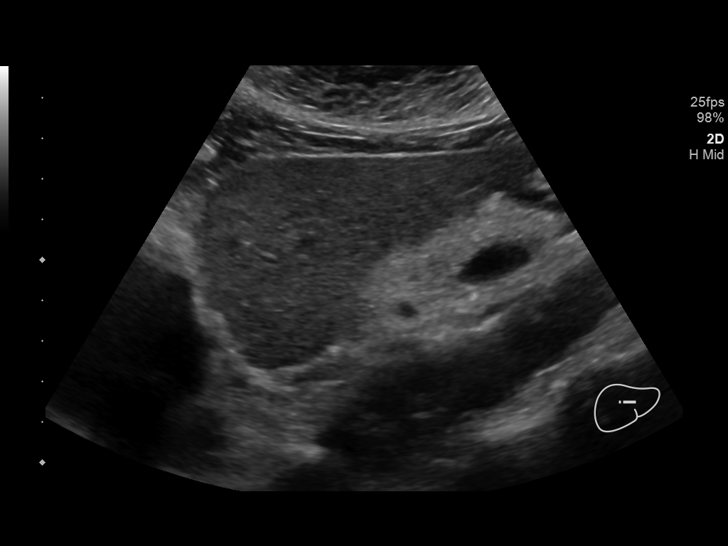
[im 37/88]
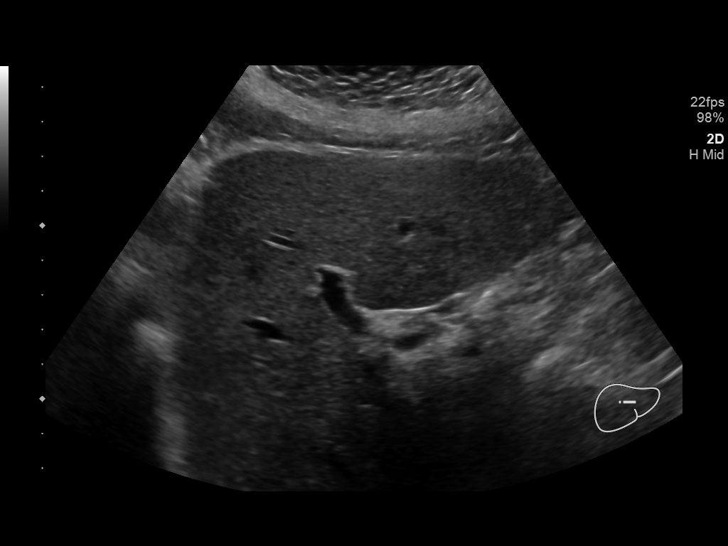
[im 44/88]
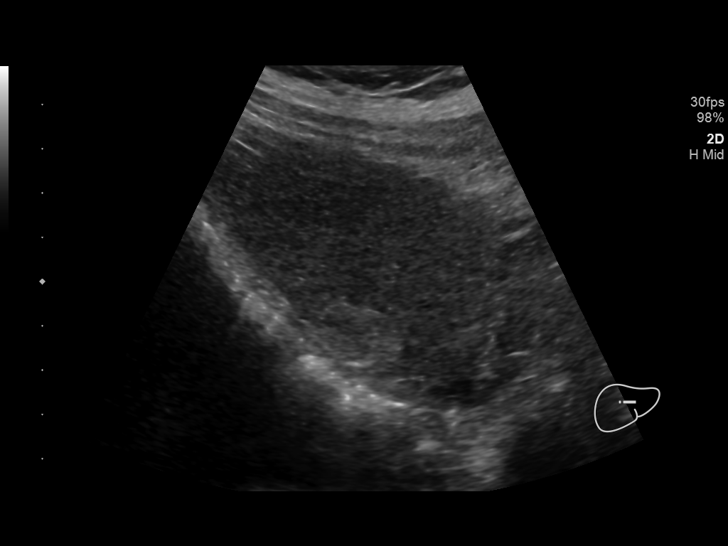
[im 51/88]
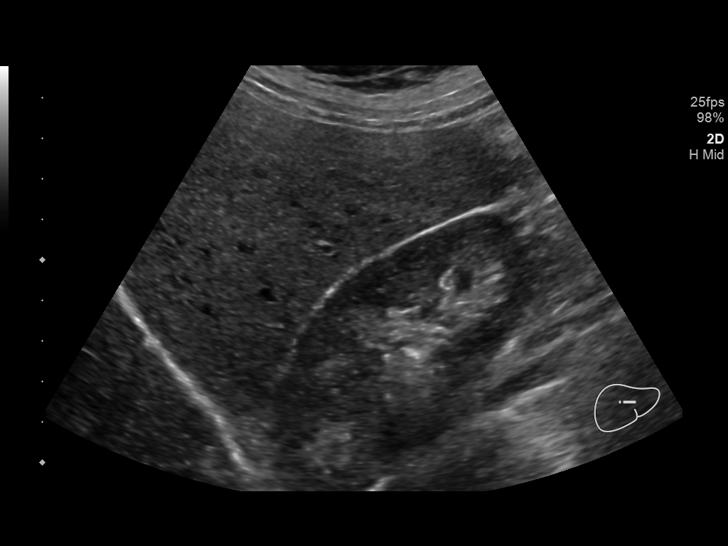
[im 59/88]
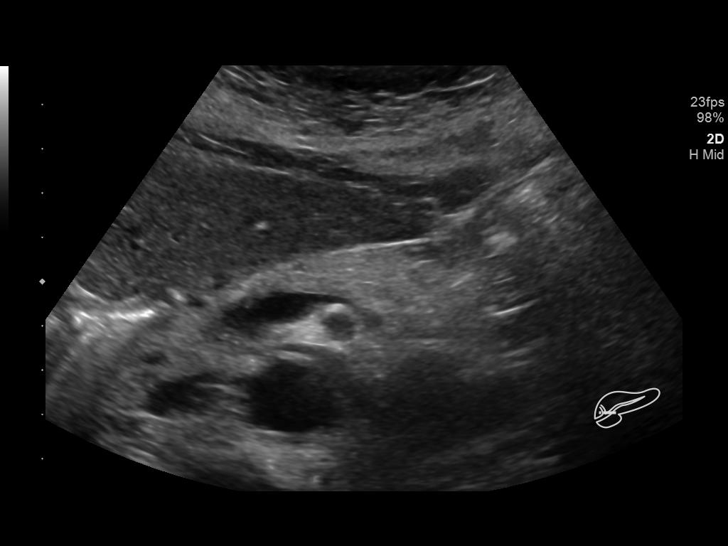
[im 66/88]
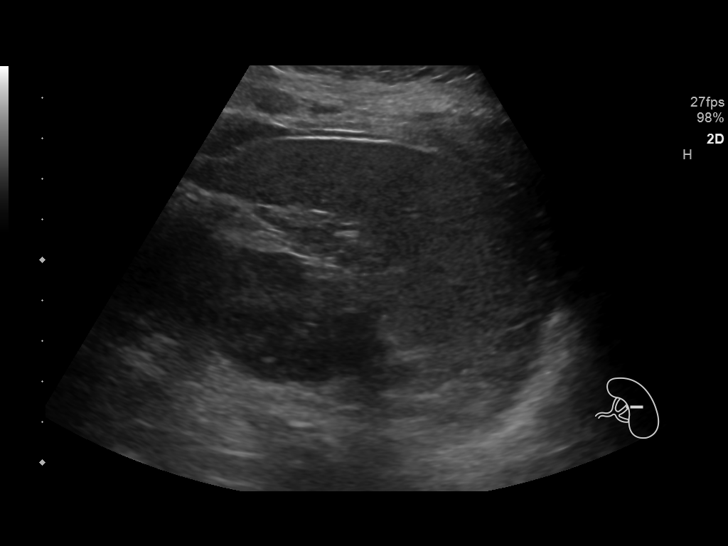
[im 73/88]
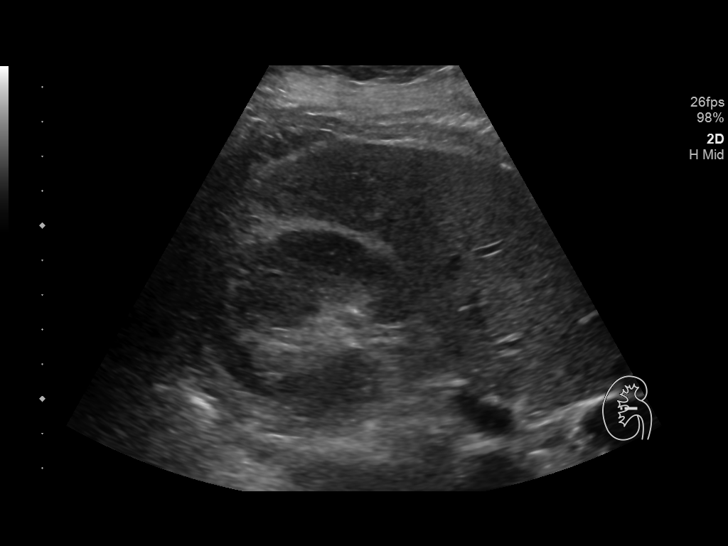
[im 80/88]
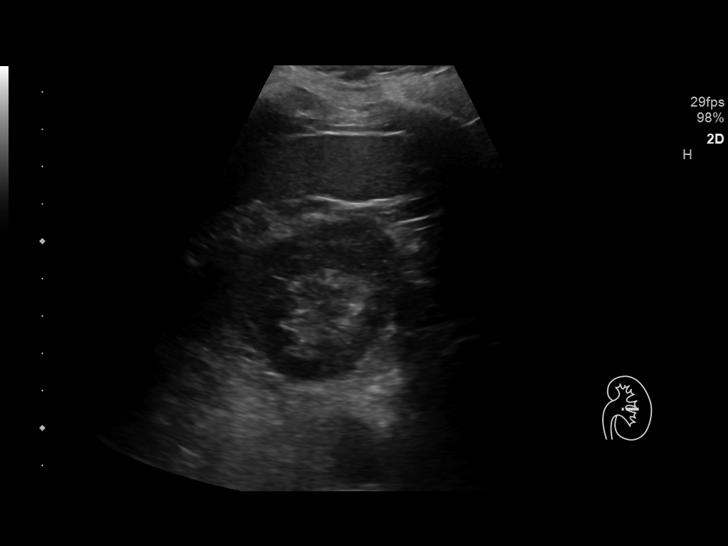
[im 88/88]
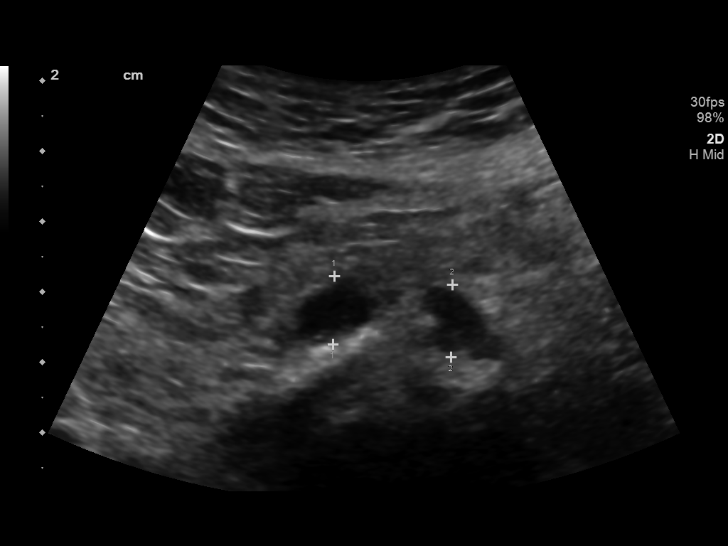

[13 of 25 positions shown; findings below may reference images not displayed]

FINDINGS: Gallbladder: No gallstones or wall thickening visualized. No
sonographic Murphy sign noted by sonographer.

Common bile duct: Diameter: 2 mm, within normal limits.

Liver: Within normal limits in parenchymal echogenicity. Two small
hepatic masses are seen which show mildly increased echogenicity.
These measure 2.0 cm and 0.8 cm in maximum diameter. These are
suspicious for small hemangiomas. Portal vein is patent on color
Doppler imaging with normal direction of blood flow towards the
liver.

IVC: No abnormality visualized.

Pancreas: Visualized portion unremarkable.

Spleen: Size and appearance within normal limits.

Right Kidney: Length: 9.5 cm. Echogenicity within normal limits. No
mass or hydronephrosis visualized.

Left Kidney: Length: 9.5 cm. Echogenicity within normal limits. No
mass or hydronephrosis visualized.

Abdominal aorta: No aneurysm visualized.

Other findings: None.
IMPRESSION: No acute findings.

Two small mildly hyperechoic hepatic masses, largest measuring 2 cm.
These are suspicious for small hemangiomas. Recommend abdomen MRI
without and with contrast for further characterization.

## 2020-02-05 ENCOUNTER — Ambulatory Visit: Payer: Medicare Other | Attending: Internal Medicine

## 2020-02-05 DIAGNOSIS — Z23 Encounter for immunization: Secondary | ICD-10-CM

## 2020-02-05 NOTE — Progress Notes (Signed)
   Covid-19 Vaccination Clinic  Name:  [name]    MRN: [id number] DOB: [birth year]  02/05/2020  [name] was observed post Covid-19 immunization for 15 minutes without incident. She was provided with Vaccine Information Sheet and instruction to access the V-Safe system.   [name] was instructed to call 911 with any severe reactions post vaccine: Marland Kitchen Difficulty breathing  . Swelling of face and throat  . A fast heartbeat  . A bad rash all over body  . Dizziness and weakness   Immunizations Administered    Name Date Dose VIS Date Route   JANSSEN COVID-19 VACCINE 02/05/2020  8:46 AM 0.5 mL 11/09/2019 Intramuscular   Manufacturer: [employer]   Lot: [id number]   [place]: [id number]

## 2020-03-02 ENCOUNTER — Other Ambulatory Visit: Payer: Self-pay

## 2020-03-02 MED ORDER — ALBUTEROL SULFATE HFA 108 (90 BASE) MCG/ACT IN AERS: 2.0000 | INHALATION_SPRAY | Freq: Four times a day (QID) | RESPIRATORY_TRACT | 2 refills | Status: DC | PRN

## 2020-03-26 ENCOUNTER — Other Ambulatory Visit: Payer: Self-pay | Admitting: Emergency Medicine

## 2020-05-21 ENCOUNTER — Other Ambulatory Visit: Payer: Self-pay | Admitting: Family Medicine

## 2020-05-28 ENCOUNTER — Encounter: Payer: Self-pay | Admitting: Family Medicine

## 2020-05-28 ENCOUNTER — Ambulatory Visit (INDEPENDENT_AMBULATORY_CARE_PROVIDER_SITE_OTHER): Payer: Medicare Other | Admitting: Family Medicine

## 2020-05-28 ENCOUNTER — Other Ambulatory Visit: Payer: Self-pay

## 2020-05-28 VITALS — BP 124/74 | HR 76 | Temp 98.5°F | Resp 14 | Ht 62.0 in | Wt 126.0 lb

## 2020-05-28 DIAGNOSIS — K625 Hemorrhage of anus and rectum: Secondary | ICD-10-CM

## 2020-05-28 DIAGNOSIS — K921 Melena: Secondary | ICD-10-CM

## 2020-05-28 DIAGNOSIS — R1084 Generalized abdominal pain: Secondary | ICD-10-CM | POA: Diagnosis not present

## 2020-05-28 MED ORDER — PANTOPRAZOLE SODIUM 40 MG PO TBEC: 40.0000 mg | DELAYED_RELEASE_TABLET | Freq: Every day | ORAL | 3 refills | Status: DC

## 2020-05-28 NOTE — Progress Notes (Signed)
Subjective:    Patient ID: [name], female    DOB: Aug 29, 1961, 59 y.o.   MRN: [id number]  HPI Patient last had a colonoscopy in 2014.  At that time the gastroenterologist made mention of erosions concerning for inflammatory bowel disease.  Dr. Sharlett Iles took biopsies that ultimately returned normal and negative for inflammatory bowel disease.  Patient states that over the last 2 to 3 months she has had near daily lower abdominal pain.  Is an achy constant pain.  However palpation does not make the pain worse.  She is also having diarrhea.  She reports 5-10 bowel movements a day.  At times they can be black.  Another time she seen bright red blood.  She denies any weight loss.  She denies any fevers or chills.  She does report early satiety and epigastric discomfort.  She denies any right upper quadrant pain or right lower quadrant abdominal pain.  She denies any chest pain or shortness of breath. Past Medical History:  Diagnosis Date  . Anxiety    BILATERAL HANDS-- LEFT >RIGHT AND LEGS  . Arthritis   . Asthma    NO INHALER  . Bed bug bite   . Blood transfusion without reported diagnosis   . CA - skin cancer   . Chronic pain due to injury RIGHT HAND/WRIST IN 2007  . Complication of anesthesia    unable to urinate after surgery  . COPD (chronic obstructive pulmonary disease) (HCC)    Stage 1  . Crush injury of hand RIGHT IN 2007   X 6 SURG'S  LAST ONE BEING TOTAL WRIST FUSION W/ BONE GRAFT AND PLATE  . Cubital tunnel syndrome    Left elbow  . Depression   . ETOH abuse   . GERD (gastroesophageal reflux disease)    05/11/17- not recently  . Hemorrhoids   . History of kidney stones    passed  . MRSA (methicillin resistant Staphylococcus aureus) carrier   . Neuromuscular disorder (HCC)    some fibromyalgia  . Neuropathy   . Osteopenia 07/2019   T score -1.9 FRAX 8.2% / 1.2%  . PTSD (post-traumatic stress disorder) 2007 RIGHT HAND CRUSH INJURY W/ 2 FINGER AMPUTATION  .  Stroke ([place]) X2 CVA'S  IN 1990   LOSS OF VISION RIGHT Peripherial bilteral  . Substance abuse ([place])    hx: narcotic use  . UC (ulcerative colitis) ([place])   . Weakness of right leg CAUSED BY RETRIEVAL THIGH MUSCLE FOR FLAP RIGHT HAND INJURY-- 2006   RIGHT LEG GIVES OUT OCCASIONALLY   Past Surgical History:  Procedure Laterality Date  . AMPUTATION Left 05/13/2017   Procedure: Left small finger revision amputation ;  Surgeon: Roseanne Kaufman, MD;  Location: [facility];  Service: Orthopedics;  Laterality: Left;  60 mins  . COLON SURGERY     15 years ago- fistula on colon removed  . COSMETIC SURGERY     right leg and abdomen  . DILATATION & CURETTAGE/HYSTEROSCOPY WITH MYOSURE N/A 06/14/2016   Procedure: [place];  Surgeon: Anastasio Auerbach, MD;  Location: [facility];  Service: Gynecology;  Laterality: N/A;  . HAND CARPECTOMY  12-24-2005   RIGHT-- INCLUDING  REMOVAL  WRIST WIRES AND I &D (EXICIOSNAL)  FOR OSTEOMYELITIS  . HYSTEROSCOPY  12.6.2012   w/removal of polyp  . HYSTEROSCOPY WITH D & C  08/18/2011   Procedure: DILATATION AND CURETTAGE (D&C) /HYSTEROSCOPY;  Surgeon: Anastasio Auerbach, MD;  Location: [facility];  Service: Gynecology;  Laterality: N/A;  . INCISION / DRAINAGE HAND / FINGER  X3  (2006 & 2007)   RIGHT CRUSH WOUND  . LESION REMOVAL  08/18/2011   Procedure: EXCISION VAGINAL LESION;  Surgeon: Anastasio Auerbach, MD;  Location: [facility];  Service: Gynecology;  Laterality: N/A;  . ORIF CARPAL BONE FRACTURE  08-31-05   I & D OF RIGHT HAND CRUSH INJURY/ OPEN FX'S AND INDEX FINGER RAY RESECTION/ COMPLICATED CLOSURE  . ORIF RADIAL FRACTURE  09-04-05   DISTAL RADIAL PLATE AND PARTIAL THUMB AMPUTATION  . RIGHT TOTAL WRIST FUSION W/ BONE GRAFT AND PLATE  [id number]   AND FLAP USING RIGHT GOIN MUSCLE  . skin cancer removal  2007   throat area  . TUBAL LIGATION  15 YRS AGO   Current Outpatient Medications on File Prior  to Visit  Medication Sig Dispense Refill  . albuterol (VENTOLIN HFA) 108 (90 Base) MCG/ACT inhaler TAKE 2 PUFFS BY MOUTH EVERY 6 HOURS AS NEEDED FOR WHEEZE OR SHORTNESS OF BREATH 8.5 each 2  . BIOTIN PO Take by mouth.    . NONFORMULARY OR COMPOUNDED ITEM Estradiol vaginal cream 9.48% insert 1 applicator twice weekly 90 each 3  . AMBULATORY NON FORMULARY MEDICATION Medication Name: [facility] inhaler - daily (Patient not taking: Reported on 05/28/2020)    . budesonide-formoterol (SYMBICORT) 160-4.5 MCG/ACT inhaler Inhale 2 puffs into the lungs 2 (two) times daily. (Patient not taking: Reported on 05/28/2020) 1 Inhaler 3  . salmeterol (SEREVENT DISKUS) 50 MCG/DOSE diskus inhaler Inhale 1 puff into the lungs 2 (two) times daily. (Patient not taking: Reported on 05/28/2020) 1 Inhaler 11  . senna (SENOKOT) 8.6 MG tablet Take 1 tablet by mouth daily. (Patient not taking: Reported on 05/28/2020)     No current facility-administered medications on file prior to visit.   Allergies  Allergen Reactions  . Aspirin Shortness Of Breath    ASTHMATIC ATTACK  . Codeine Other (See Comments)    ABNORMAL BEHAVIOR  Increased in bad moods  NO CODEINE BASED MEDICATIONS   . Vicodin [Hydrocodone-Acetaminophen] Other (See Comments)    Extreme anxiety   Social History   Socioeconomic History  . Marital status: Married    Spouse name: Not on file  . Number of children: 6  . Years of education: Not on file  . Highest education level: Not on file  Occupational History  . Occupation: Housekeeper   Tobacco Use  . Smoking status: Former Smoker    Packs/day: 1.50    Years: 28.00    Pack years: 42.00    Types: Cigarettes    Quit date: 11/29/2001    Years since quitting: 18.5  . Smokeless tobacco: Never Used  Vaping Use  . Vaping Use: Never used  Substance and Sexual Activity  . Alcohol use: Not Currently  . Drug use: Yes    Types: Marijuana  . Sexual activity: Yes    Partners: Male    Birth control/protection:  Post-menopausal    Comment: 1st intercourse 59 yo-More than 5 partners  Other Topics Concern  . Not on file  Social History Narrative  . Not on file   Social Determinants of Health   Financial Resource Strain:   . Difficulty of Paying Living Expenses: Not on file  Food Insecurity:   . Worried About Charity fundraiser in the Last Year: Not on file  . Ran Out of Food in the Last Year: Not on file  Transportation Needs:   . Film/video editor (Medical): Not on file  . Lack of Transportation (Non-Medical): Not on file  Physical Activity:   . Days of Exercise per Week: Not on file  . Minutes of Exercise per Session: Not on file  Stress:   . Feeling of Stress : Not on file  Social Connections:   . Frequency of Communication with Friends and Family: Not on file  . Frequency of Social Gatherings with Friends and Family: Not on file  . Attends Religious Services: Not on file  . Active Member of Clubs or Organizations: Not on file  . Attends Archivist Meetings: Not on file  . Marital Status: Not on file  Intimate Partner Violence:   . Fear of Current or Ex-Partner: Not on file  . Emotionally Abused: Not on file  . Physically Abused: Not on file  . Sexually Abused: Not on file     Review of Systems  Gastrointestinal: Positive for abdominal pain.  All other systems reviewed and are negative.      Objective:   Physical Exam Vitals reviewed.  Cardiovascular:     Rate and Rhythm: Normal rate and regular rhythm.     Heart sounds: Normal heart sounds. No murmur heard.   Pulmonary:     Effort: Pulmonary effort is normal. No respiratory distress.     Breath sounds: Normal breath sounds. Decreased air movement present. No stridor. No wheezing, rhonchi or rales.  Abdominal:     General: Bowel sounds are normal. There is no distension.     Palpations: Abdomen is soft. There is no mass.     Tenderness: There is generalized abdominal tenderness and tenderness in the  epigastric area. There is no guarding or rebound.           Assessment & Plan:  Generalized abdominal pain - Plan: CBC with Differential/Platelet, COMPLETE METABOLIC PANEL WITH GFR, Lipase  Black stool  BRBPR (bright red blood per rectum)  Given the epigastric discomfort and the black stool, I am concerned about possibly an ulcer.  Therefore I will start the patient on Protonix 40 mg a day and recommended she avoid all NSAIDs.  Obtain a CBC to evaluate for severe anemia that will require hospitalization however her vital signs today are completely stable and she is nontoxic in appearance.  I believe the patient would benefit from an EGD.  However also given her generalized abdominal discomfort, the bright red blood per rectum, and the 2 months of diarrhea, I believe the patient would require colonoscopy as well to rule out inflammatory bowel disease such as Crohn's disease or ulcerative colitis.  Therefore I will consult GI for both of these issues.  If the patient's endoscopy reveals no abnormalities and lab work is normal, the next step would be to obtain a CT scan of the abdomen and pelvis.

## 2020-05-29 ENCOUNTER — Other Ambulatory Visit: Payer: Self-pay | Admitting: *Deleted

## 2020-05-29 DIAGNOSIS — K921 Melena: Secondary | ICD-10-CM

## 2020-05-29 DIAGNOSIS — K625 Hemorrhage of anus and rectum: Secondary | ICD-10-CM

## 2020-05-29 DIAGNOSIS — R1084 Generalized abdominal pain: Secondary | ICD-10-CM

## 2020-05-29 LAB — CBC WITH DIFFERENTIAL/PLATELET
Absolute Monocytes: 1213 cells/uL — ABNORMAL HIGH (ref 200–950)
Basophils Absolute: 63 cells/uL (ref 0–200)
Basophils Relative: 0.5 %
Eosinophils Absolute: 350 cells/uL (ref 15–500)
Eosinophils Relative: 2.8 %
HCT: 41.9 % (ref 35.0–45.0)
Hemoglobin: 13.9 g/dL (ref 11.7–15.5)
Lymphs Abs: 4138 cells/uL — ABNORMAL HIGH (ref 850–3900)
MCH: 31.6 pg (ref 27.0–33.0)
MCHC: 33.2 g/dL (ref 32.0–36.0)
MCV: 95.2 fL (ref 80.0–100.0)
MPV: 10.4 fL (ref 7.5–12.5)
Monocytes Relative: 9.7 %
Neutro Abs: 6738 cells/uL (ref 1500–7800)
Neutrophils Relative %: 53.9 %
Platelets: 370 10*3/uL (ref 140–400)
RBC: 4.4 10*6/uL (ref 3.80–5.10)
RDW: 12.6 % (ref 11.0–15.0)
Total Lymphocyte: 33.1 %
WBC: 12.5 10*3/uL — ABNORMAL HIGH (ref 3.8–10.8)

## 2020-05-29 LAB — COMPLETE METABOLIC PANEL WITH GFR
AG Ratio: 1.8 (calc) (ref 1.0–2.5)
ALT: 15 U/L (ref 6–29)
AST: 21 U/L (ref 10–35)
Albumin: 4.2 g/dL (ref 3.6–5.1)
Alkaline phosphatase (APISO): 56 U/L (ref 37–153)
BUN: 9 mg/dL (ref 7–25)
CO2: 28 mmol/L (ref 20–32)
Calcium: 9.9 mg/dL (ref 8.6–10.4)
Chloride: 105 mmol/L (ref 98–110)
Creat: 0.7 mg/dL (ref 0.50–0.99)
GFR, Est African American: 109 mL/min/{1.73_m2} (ref >=60)
GFR, Est Non African American: 94 mL/min/{1.73_m2} (ref >=60)
Globulin: 2.3 g/dL (calc) (ref 1.9–3.7)
Glucose, Bld: 74 mg/dL (ref 65–99)
Potassium: 4.5 mmol/L (ref 3.5–5.3)
Sodium: 140 mmol/L (ref 135–146)
Total Bilirubin: 0.5 mg/dL (ref 0.2–1.2)
Total Protein: 6.5 g/dL (ref 6.1–8.1)

## 2020-05-29 LAB — LIPASE: Lipase: 67 U/L — ABNORMAL HIGH (ref 7–60)

## 2020-06-11 ENCOUNTER — Ambulatory Visit
Admission: RE | Admit: 2020-06-11 | Discharge: 2020-06-11 | Disposition: A | Discharge status: Home / Self Care | Payer: Medicare Other | Source: Ambulatory Visit | Attending: Family Medicine | Admitting: Family Medicine

## 2020-06-11 DIAGNOSIS — I700 Atherosclerosis of aorta: Secondary | ICD-10-CM | POA: Diagnosis not present

## 2020-06-11 DIAGNOSIS — R1084 Generalized abdominal pain: Secondary | ICD-10-CM

## 2020-06-11 DIAGNOSIS — R1110 Vomiting, unspecified: Secondary | ICD-10-CM | POA: Diagnosis not present

## 2020-06-11 DIAGNOSIS — N200 Calculus of kidney: Secondary | ICD-10-CM | POA: Diagnosis not present

## 2020-06-11 DIAGNOSIS — K625 Hemorrhage of anus and rectum: Secondary | ICD-10-CM

## 2020-06-11 DIAGNOSIS — K921 Melena: Secondary | ICD-10-CM

## 2020-06-11 MED ORDER — IOPAMIDOL (ISOVUE-300) INJECTION 61%
100.0000 mL | Freq: Once | INTRAVENOUS | Status: AC | PRN
  Administered 2020-06-11: 100 mL via INTRAVENOUS

## 2020-06-12 ENCOUNTER — Encounter: Payer: Self-pay | Admitting: Family Medicine

## 2020-06-12 DIAGNOSIS — I700 Atherosclerosis of aorta: Secondary | ICD-10-CM | POA: Insufficient documentation

## 2020-06-26 ENCOUNTER — Other Ambulatory Visit: Payer: Self-pay | Admitting: Family Medicine

## 2020-06-26 DIAGNOSIS — Z1231 Encounter for screening mammogram for malignant neoplasm of breast: Secondary | ICD-10-CM

## 2020-06-29 ENCOUNTER — Ambulatory Visit
Admission: RE | Admit: 2020-06-29 | Discharge: 2020-06-29 | Disposition: A | Discharge status: Home / Self Care | Payer: Medicare Other | Source: Ambulatory Visit | Attending: Family Medicine | Admitting: Family Medicine

## 2020-06-29 ENCOUNTER — Other Ambulatory Visit: Payer: Self-pay

## 2020-06-29 DIAGNOSIS — Z1231 Encounter for screening mammogram for malignant neoplasm of breast: Secondary | ICD-10-CM

## 2020-07-24 ENCOUNTER — Ambulatory Visit (INDEPENDENT_AMBULATORY_CARE_PROVIDER_SITE_OTHER): Payer: Medicare Other | Admitting: Gastroenterology

## 2020-07-24 ENCOUNTER — Encounter: Payer: Self-pay | Admitting: Gastroenterology

## 2020-07-24 VITALS — BP 140/80 | HR 74 | Ht 62.0 in | Wt 129.0 lb

## 2020-07-24 DIAGNOSIS — F1020 Alcohol dependence, uncomplicated: Secondary | ICD-10-CM | POA: Diagnosis not present

## 2020-07-24 DIAGNOSIS — R109 Unspecified abdominal pain: Secondary | ICD-10-CM | POA: Diagnosis not present

## 2020-07-24 DIAGNOSIS — R194 Change in bowel habit: Secondary | ICD-10-CM | POA: Diagnosis not present

## 2020-07-24 DIAGNOSIS — K5909 Other constipation: Secondary | ICD-10-CM

## 2020-07-24 DIAGNOSIS — K625 Hemorrhage of anus and rectum: Secondary | ICD-10-CM | POA: Diagnosis not present

## 2020-07-24 DIAGNOSIS — G8929 Other chronic pain: Secondary | ICD-10-CM

## 2020-07-24 MED ORDER — DICYCLOMINE HCL 20 MG PO TABS: 20.0000 mg | ORAL_TABLET | Freq: Three times a day (TID) | ORAL | 1 refills | Status: DC

## 2020-07-24 MED ORDER — SUPREP BOWEL PREP KIT 17.5-3.13-1.6 GM/177ML PO SOLN: 1.0000 | ORAL | 0 refills | Status: DC

## 2020-07-24 NOTE — Progress Notes (Signed)
[place] VISIT   Primary Care Provider Susy Frizzle, MD [address] [place]  [address] [phone]  Patient Profile: [name] is a 59 y.o. female with a pmh significant for COPD, Anxiety, s/p right digit resection and muscle flap, Chronic constipation, hemorrhoidal disease, PTSD, rectal prolapse, alcohol abuse disorder (previously in remission and now drinking once again), GERD.  The patient presents to the [facility] for an evaluation and management of problem(s) noted below:  Problem List 1. Chronic abdominal pain   2. Change in bowel habits   3. Chronic constipation   4. Uncomplicated alcohol dependence ([place])   5. Rectal bleeding     History of Present Illness: Please see initial consultation note for full details of HPI.    Interval History The patient returns to clinic.  It has been nearly 2 years since I last saw the patient.  Patient states that she has started drinking alcohol once again.  By drinking alcohol she has had improvement in her chronic constipation and she has at least 1 bowel movement on a daily basis.  Patient describes improvement in her previous rectal bleeding.  Patient however still has GERD symptoms.  She also has chronic abdominal pain below her bellybutton.  She has bloating and discomfort.  She wonders if she will ever feel 100%.  When she defecates she has improvement in her pain greater than 75% of the time.  But there are times where she has a bowel movement does not have improvement in her pain.  She does not think that this is necessarily cramping but cannot say with the 568% certainty.  She describes previously following a food diary without being able to tell differences between different foods intake.  Patient does not take significant nonsteroidals or BC/Goody powders.  GI Review of Systems Positive as above Negative for dysphagia, odynophagia, jaundice, tenesmus, melena    Review of Systems General: Denies fevers/chills/weight loss unintentionally Cardiovascular: Denies chest pain/palpitations Pulmonary: Denies shortness of breath Gastroenterological: See HPI Genitourinary: Denies darkened urine Hematological: Denies easy bruising/bleeding Endocrine: Denies temperature intolerance Dermatological: Denies jaundice Psychological: Mood is anxious to get an answer   Medications Current Outpatient Medications  Medication Sig Dispense Refill  . albuterol (VENTOLIN HFA) 108 (90 Base) MCG/ACT inhaler TAKE 2 PUFFS BY MOUTH EVERY 6 HOURS AS NEEDED FOR WHEEZE OR SHORTNESS OF BREATH 8.5 each 2  . BIOTIN PO Take by mouth.    . NONFORMULARY OR COMPOUNDED ITEM Estradiol vaginal cream 6.16% insert 1 applicator twice weekly 90 each 3  . pantoprazole (PROTONIX) 40 MG tablet Take 1 tablet (40 mg total) by mouth daily. 30 tablet 3  . dicyclomine (BENTYL) 20 MG tablet Take 1 tablet (20 mg total) by mouth 4 (four) times daily -  before meals and at bedtime. 60 tablet 1  . Na Sulfate-K Sulfate-Mg Sulf (SUPREP BOWEL PREP KIT) 17.5-3.13-1.6 GM/177ML SOLN Take 1 kit by mouth as directed. For colonoscopy prep 354 mL 0   No current facility-administered medications for this visit.    Allergies Allergies  Allergen Reactions  . Aspirin Shortness Of Breath    ASTHMATIC ATTACK  . Codeine Other (See Comments)    ABNORMAL BEHAVIOR  Increased in bad moods  NO CODEINE BASED MEDICATIONS   . Vicodin [Hydrocodone-Acetaminophen] Other (See Comments)    Extreme anxiety    Histories Past Medical History:  Diagnosis Date  . Anxiety    BILATERAL HANDS-- LEFT >RIGHT AND LEGS  .  Aortic atherosclerosis ([place])   . Arthritis   . Asthma    NO INHALER  . Bed bug bite   . Blood transfusion without reported diagnosis   . CA - skin cancer   . Chronic pain due to injury RIGHT HAND/WRIST IN 2007  . Complication of anesthesia    unable to urinate after surgery  . COPD (chronic  obstructive pulmonary disease) (HCC)    Stage 1  . Crush injury of hand RIGHT IN 2007   X 6 SURG'S  LAST ONE BEING TOTAL WRIST FUSION W/ BONE GRAFT AND PLATE  . Cubital tunnel syndrome    Left elbow  . Depression   . ETOH abuse   . GERD (gastroesophageal reflux disease)    05/11/17- not recently  . Hemorrhoids   . History of kidney stones    passed  . MRSA (methicillin resistant Staphylococcus aureus) carrier   . Neuromuscular disorder (HCC)    some fibromyalgia  . Neuropathy   . Osteopenia 07/2019   T score -1.9 FRAX 8.2% / 1.2%  . PTSD (post-traumatic stress disorder) 2007 RIGHT HAND CRUSH INJURY W/ 2 FINGER AMPUTATION  . Stroke ([place]) X2 CVA'S  IN 1990   LOSS OF VISION RIGHT Peripherial bilteral  . Substance abuse ([place])    hx: narcotic use  . UC (ulcerative colitis) ([place])   . Weakness of right leg CAUSED BY RETRIEVAL THIGH MUSCLE FOR FLAP RIGHT HAND INJURY-- 2006   RIGHT LEG GIVES OUT OCCASIONALLY   Past Surgical History:  Procedure Laterality Date  . AMPUTATION Left 05/13/2017   Procedure: Left small finger revision amputation ;  Surgeon: Roseanne Kaufman, MD;  Location: [facility];  Service: Orthopedics;  Laterality: Left;  60 mins  . COLON SURGERY     15 years ago- fistula on colon removed  . COSMETIC SURGERY     right leg and abdomen  . DILATATION & CURETTAGE/HYSTEROSCOPY WITH MYOSURE N/A 06/14/2016   Procedure: [place];  Surgeon: Anastasio Auerbach, MD;  Location: [facility];  Service: Gynecology;  Laterality: N/A;  . HAND CARPECTOMY  12-24-2005   RIGHT-- INCLUDING  REMOVAL  WRIST WIRES AND I &D (EXICIOSNAL)  FOR OSTEOMYELITIS  . HYSTEROSCOPY  12.6.2012   w/removal of polyp  . HYSTEROSCOPY WITH D & C  08/18/2011   Procedure: DILATATION AND CURETTAGE (D&C) /HYSTEROSCOPY;  Surgeon: Anastasio Auerbach, MD;  Location: [facility];  Service: Gynecology;  Laterality: N/A;  . INCISION / DRAINAGE HAND / FINGER  X3  (2006 & 2007)    RIGHT CRUSH WOUND  . LESION REMOVAL  08/18/2011   Procedure: EXCISION VAGINAL LESION;  Surgeon: Anastasio Auerbach, MD;  Location: [facility];  Service: Gynecology;  Laterality: N/A;  . ORIF CARPAL BONE FRACTURE  08-31-05   I & D OF RIGHT HAND CRUSH INJURY/ OPEN FX'S AND INDEX FINGER RAY RESECTION/ COMPLICATED CLOSURE  . ORIF RADIAL FRACTURE  09-04-05   DISTAL RADIAL PLATE AND PARTIAL THUMB AMPUTATION  . RIGHT TOTAL WRIST FUSION W/ BONE GRAFT AND PLATE  [id number]   AND FLAP USING RIGHT GOIN MUSCLE  . skin cancer removal  2007   throat area  . TUBAL LIGATION  15 YRS AGO   Social History   Socioeconomic History  . Marital status: Married    Spouse name: Not on file  . Number of children: 6  . Years of education: Not on file  . Highest education level: Not on file  Occupational History  . Occupation: Housekeeper   Tobacco Use  . Smoking status: Former Smoker    Packs/day: 1.50    Years: 28.00    Pack years: 42.00    Types: Cigarettes    Quit date: 11/29/2001    Years since quitting: 18.6  . Smokeless tobacco: Never Used  Vaping Use  . Vaping Use: Never used  Substance and Sexual Activity  . Alcohol use: Not Currently  . Drug use: Yes    Types: Marijuana  . Sexual activity: Yes    Partners: Male    Birth control/protection: Post-menopausal    Comment: 1st intercourse 59 yo-More than 5 partners  Other Topics Concern  . Not on file  Social History Narrative  . Not on file   Social Determinants of Health   Financial Resource Strain:   . Difficulty of Paying Living Expenses: Not on file  Food Insecurity:   . Worried About Charity fundraiser in the Last Year: Not on file  . Ran Out of Food in the Last Year: Not on file  Transportation Needs:   . Lack of Transportation (Medical): Not on file  . Lack of Transportation (Non-Medical): Not on file  Physical Activity:   . Days of Exercise per Week: Not on file  . Minutes of Exercise per Session: Not on file   Stress:   . Feeling of Stress : Not on file  Social Connections:   . Frequency of Communication with Friends and Family: Not on file  . Frequency of Social Gatherings with Friends and Family: Not on file  . Attends Religious Services: Not on file  . Active Member of Clubs or Organizations: Not on file  . Attends Archivist Meetings: Not on file  . Marital Status: Not on file  Intimate Partner Violence:   . Fear of Current or Ex-Partner: Not on file  . Emotionally Abused: Not on file  . Physically Abused: Not on file  . Sexually Abused: Not on file   Family History  Problem Relation Age of Onset  . Lupus Mother        died at 48  . Breast cancer Maternal Aunt 35  . Breast cancer Maternal Grandmother 60  . Stomach cancer Paternal Grandfather   . Colon cancer Neg Hx   . Rectal cancer Neg Hx   . Esophageal cancer Neg Hx   . Inflammatory bowel disease Neg Hx   . Liver disease Neg Hx   . Pancreatic cancer Neg Hx    I have reviewed her medical, social, and family history in detail and updated the electronic medical record as necessary.    PHYSICAL EXAMINATION  BP 140/80   Pulse 74   Ht _0  (1.575 m)   Wt 129 lb (58.5 kg)   SpO2 97%   BMI 23.59 kg/m  Wt Readings from Last 3 Encounters:  07/24/20 129 lb (58.5 kg)  05/28/20 126 lb (57.2 kg)  12/03/19 127 lb (57.6 kg)  GEN: NAD, appears stated age, doesn't appear chronically ill PSYCH: Cooperative, without pressured speech EYE: Conjunctivae pink, sclerae anicteric ENT: MMM CV: Nontachycardic RESP: No audible wheezing GI: NABS, soft, multiple surgical scars noted in the midline as well as right lower quadrant, mildly protuberant, tenderness to palpation throughout the abdomen appreciated MSK/EXT: Trace bilateral lower extremity edema SKIN: No jaundice, no spider angiomata  NEURO:  Alert & Oriented x 3, no focal deficits, no evidence of asterixis   REVIEW OF DATA  I reviewed the following data at the time of  this encounter:  GI Procedures and Studies  Reviewed once again the Nov 14, 2012 colonoscopy The mucosa of the colon appeared unremarkable except for the last 15 cm where there was some nodular areas with scattered erosions.  Biopsies were obtained.  Retroflexion of the rectum showed prominent internal hemorrhoids.  The colonoscope was inserted into the cecum with the rest of the colon being unremarkable.  Biopsies were obtained.  Patient was started on Canasa suppositories.  Laboratory Studies  Reviewed in epic  Imaging Studies  September 2021 CT abdomen pelvis IMPRESSION: No CT evidence of acute pancreatitis. Aortic atherosclerosis. No acute findings in the abdomen or pelvis.   ASSESSMENT  Ms. Howerton is a 59 y.o. female with a pmh significant for COPD, Anxiety, s/p right digit resection and muscle flap, Chronic constipation, hemorrhoidal disease, PTSD, rectal prolapse, alcohol abuse disorder (previously in remission and now drinking once again), GERD.  The patient is seen today for evaluation and management of:  1. Chronic abdominal pain   2. Change in bowel habits   3. Chronic constipation   4. Uncomplicated alcohol dependence ([place])   5. Rectal bleeding    The patient is hemodynamically stable.  Fortunately, the patient has significant clinical symptoms.  The etiology of the symptoms likely will end up being functional in origin as she has a significant improvement the majority of the time after she defecates.  With that being said we will try to better define things for her.  Endoscopic evaluation from above and below is recommended but she will reach out to her insurance to find out if she can afford these based on copayments..  A fecal elastase will be obtained to rule out EPI.  SIBO breath testing will be considered in the future.  I recommend alcohol abstinence or at least cutting down but she is not interested in that at this time as result of it actually helping her have bowel  movements more frequently.  We will initiate Bentyl to see if this may be helpful on a as needed basis for the patient up to 4 times daily.  The risks and benefits of endoscopic evaluation were discussed with the patient; these include but are not limited to the risk of perforation, infection, bleeding, missed lesions, lack of diagnosis, severe illness requiring hospitalization, as well as anesthesia and sedation related illnesses.  The patient is agreeable to proceed if it is financially affordable for her.  All patient questions were answered to the best of my ability, and the patient agrees to the aforementioned plan of action with follow-up as indicated.   PLAN  Recommend restarting FiberCon 1-2 times daily Maintain good H2O consumption Endoscopic evaluation from above (gastric/duodenal biopsies at minimum) Endoscopic evaluation from below  Bentyl 20 mg 1-4 times daily as needed over the course the next 2 to 3 weeks to see if there is any effectiveness otherwise we will stop the medication   Orders Placed This Encounter  Procedures  . Pancreatic elastase, fecal  . Ambulatory referral to Gastroenterology    New Prescriptions   DICYCLOMINE (BENTYL) 20 MG TABLET    Take 1 tablet (20 mg total) by mouth 4 (four) times daily -  before meals and at bedtime.   NA SULFATE-K SULFATE-MG SULF (SUPREP BOWEL PREP KIT) 17.5-3.13-1.6 GM/177ML SOLN    Take 1 kit by mouth as directed. For colonoscopy prep   Modified Medications   No medications on file  Planned Follow Up: No follow-ups on file.   Total Time in Face-to-Face and in Coordination of Care for patient including independent/personal interpretation/review of prior testing, medical history, examination, medication adjustment, communicating results with the patient directly, and documentation with the EHR is 30 minutes.    Justice Britain, MD [facility] Office # [phone]

## 2020-07-24 NOTE — Patient Instructions (Addendum)
You have been scheduled for an endoscopy and colonoscopy. Please follow the written instructions given to you at your visit today. Please pick up your prep supplies at the pharmacy within the next 1-3 days. If you use inhalers (even only as needed), please bring them with you on the day of your procedure.  Please contact your insurance company to see what your cost would be for a diagnostic colonoscopy and upper endoscopy.   We have sent the following medications to your pharmacy for you to pick up at your convenience: [place], [place] provider has requested that you go to the basement level for lab work before leaving today. Press "B" on the elevator. The lab is located at the first door on the left as you exit the elevator.   Due to recent changes in healthcare laws, you may see the results of your imaging and laboratory studies on MyChart before your provider has had a chance to review them.  We understand that in some cases there may be results that are confusing or concerning to you. Not all laboratory results come back in the same time frame and the provider may be waiting for multiple results in order to interpret others.  Please give [place] 48 hours in order for your provider to thoroughly review all the results before contacting the office for clarification of your results.   Thank you for choosing me and [facility].  Dr. Rush Landmark

## 2020-07-27 ENCOUNTER — Encounter: Payer: Self-pay | Admitting: Gastroenterology

## 2020-07-27 DIAGNOSIS — G8929 Other chronic pain: Secondary | ICD-10-CM | POA: Insufficient documentation

## 2020-07-27 DIAGNOSIS — K625 Hemorrhage of anus and rectum: Secondary | ICD-10-CM | POA: Insufficient documentation

## 2020-07-27 DIAGNOSIS — R194 Change in bowel habit: Secondary | ICD-10-CM | POA: Insufficient documentation

## 2020-07-30 ENCOUNTER — Other Ambulatory Visit: Payer: Medicare Other

## 2020-07-30 ENCOUNTER — Other Ambulatory Visit: Payer: Self-pay

## 2020-07-30 ENCOUNTER — Encounter: Payer: Self-pay | Admitting: Nurse Practitioner

## 2020-07-30 ENCOUNTER — Telehealth: Payer: Self-pay | Admitting: *Deleted

## 2020-07-30 ENCOUNTER — Ambulatory Visit (INDEPENDENT_AMBULATORY_CARE_PROVIDER_SITE_OTHER): Payer: Medicare Other | Admitting: Nurse Practitioner

## 2020-07-30 VITALS — BP 122/78 | Ht 61.0 in | Wt 129.4 lb

## 2020-07-30 DIAGNOSIS — N951 Menopausal and female climacteric states: Secondary | ICD-10-CM

## 2020-07-30 DIAGNOSIS — Z01419 Encounter for gynecological examination (general) (routine) without abnormal findings: Secondary | ICD-10-CM

## 2020-07-30 DIAGNOSIS — G8929 Other chronic pain: Secondary | ICD-10-CM

## 2020-07-30 DIAGNOSIS — R194 Change in bowel habit: Secondary | ICD-10-CM

## 2020-07-30 DIAGNOSIS — R109 Unspecified abdominal pain: Secondary | ICD-10-CM | POA: Diagnosis not present

## 2020-07-30 DIAGNOSIS — M8589 Other specified disorders of bone density and structure, multiple sites: Secondary | ICD-10-CM

## 2020-07-30 MED ORDER — NONFORMULARY OR COMPOUNDED ITEM
4 refills | Status: DC
Start: 2020-07-30 — End: 2021-05-13

## 2020-07-30 MED ORDER — NONFORMULARY OR COMPOUNDED ITEM: 4 refills | Status: DC

## 2020-07-30 MED ORDER — NONFORMULARY OR COMPOUNDED ITEM: 3 refills | Status: DC

## 2020-07-30 NOTE — Progress Notes (Signed)
   [name] [birth year] [id number]   History:  59 y.o. G6P6 presents for annual exam without GYN complaints. Postmenopausal using vaginal estradiol cream for vaginal dryness with good relief. Normal mammogram history. Sexually active with boyfriend. Seeing GI for chronic abdominal pain with plans for endoscopy in January.   Gynecologic History No LMP recorded. Patient is postmenopausal.   Contraception: post menopausal status Last Pap: 04/2016. Results were: normal Last mammogram: 07/02/2020. Results were: normal Last colonoscopy: 2014. Results were: normal Last Dexa: 07/16/2019. Results were: t-score -1.5, FRAX 8.2% / 1.2%  Past medical history, past surgical history, family history and social history were all reviewed and documented in the EPIC chart.  ROS:  A ROS was performed and pertinent positives and negatives are included.  Exam:  Vitals:   07/30/60 1143  BP: 122/78  Weight: 129 lb 6.4 oz (58.7 kg)  Height: 5' 1"  (1.549 m)   Body mass index is 24.45 kg/m.  General appearance:  Normal Thyroid:  Symmetrical, normal in size, without palpable masses or nodularity. Respiratory  Auscultation:  Clear without wheezing or rhonchi Cardiovascular  Auscultation:  Regular rate, without rubs, murmurs or gallops  Edema/varicosities:  Not grossly evident Abdominal  Soft,nontender, without masses, guarding or rebound.  Liver/spleen:  No organomegaly noted  Hernia:  None appreciated  Skin  Inspection:  Grossly normal   Breasts: Examined lying and sitting.   Right: Without masses, retractions, discharge or axillary adenopathy.   Left: Without masses, retractions, discharge or axillary adenopathy. Gentitourinary   Inguinal/mons:  Normal without inguinal adenopathy  External genitalia:  Normal  BUS/Urethra/Skene's glands:  Normal  Vagina:  Atrophic changes  Cervix:  Normal  Uterus:  Normal in size, shape and contour.  Midline and mobile  Adnexa/parametria:      Rt: Without masses or tenderness.   Lt: Without masses or tenderness.  Anus and perineum: Normal  Digital rectal exam: Declined  Assessment/Plan:  59 y.o. G6P6 for annual exam.   Well woman exam with routine gynecological exam - Education provided on SBEs, importance of preventative screenings, current guidelines, high calcium diet, regular exercise, and multivitamin daily. Labs with PCP.  Menopausal vaginal dryness - using Estradiol 0.02% vaginal cream 1-2 times per week with good relief. Refill provided.   Osteopenia of multiple sites - recommend daily Vitamin D supplement and regular exercise that incorporates resistance training.   Screening for cervical cancer - Normal pap history. Pap with HR HPV typing today.   Screening for breast cancer - Normal mammogram history. Continue annual screenings. Normal breast exam today.   Screening for colon cancer - Will repeat at GI's recommended interval.  Follow up in 1 year for annual.       Tamela Gammon [facility], 11:53 AM 07/30/2020

## 2020-07-30 NOTE — Telephone Encounter (Signed)
-----   Message from Tamela Gammon, NP sent at 07/30/2020 11:59 AM EST ----- Please call in compounded Estradiol 0.02% vaginal cream to [facility]. Thank you.

## 2020-07-30 NOTE — Telephone Encounter (Signed)
Rx called into [facility]

## 2020-07-30 NOTE — Addendum Note (Signed)
Addended by: Thamas Jaegers on: 07/30/2020 12:17 PM   Modules accepted: Orders

## 2020-07-30 NOTE — Patient Instructions (Signed)

## 2020-08-01 ENCOUNTER — Other Ambulatory Visit: Payer: Self-pay | Admitting: Gastroenterology

## 2020-08-03 LAB — PAP, TP IMAGING W/ HPV RNA, RFLX HPV TYPE 16,18/45: HPV DNA High Risk: NOT DETECTED

## 2020-08-05 LAB — PANCREATIC ELASTASE, FECAL: Pancreatic Elastase-1, Stool: >500 mcg/g

## 2020-08-07 ENCOUNTER — Other Ambulatory Visit: Payer: Self-pay | Admitting: Family Medicine

## 2020-08-19 ENCOUNTER — Other Ambulatory Visit: Payer: Self-pay | Admitting: Family Medicine

## 2020-08-20 ENCOUNTER — Other Ambulatory Visit: Payer: Self-pay | Admitting: Gastroenterology

## 2020-09-03 ENCOUNTER — Other Ambulatory Visit: Payer: Self-pay | Admitting: Gastroenterology

## 2020-09-18 ENCOUNTER — Encounter: Payer: Medicare Other | Admitting: Gastroenterology

## 2020-09-29 ENCOUNTER — Encounter: Payer: Self-pay | Admitting: Orthopaedic Surgery

## 2020-09-29 ENCOUNTER — Ambulatory Visit: Payer: Medicare Other | Admitting: Orthopaedic Surgery

## 2020-09-29 ENCOUNTER — Other Ambulatory Visit: Payer: Self-pay

## 2020-09-29 ENCOUNTER — Ambulatory Visit: Payer: Self-pay

## 2020-09-29 VITALS — BP 176/98 | HR 73

## 2020-09-29 DIAGNOSIS — M654 Radial styloid tenosynovitis [de Quervain]: Secondary | ICD-10-CM | POA: Diagnosis not present

## 2020-09-29 DIAGNOSIS — M19032 Primary osteoarthritis, left wrist: Secondary | ICD-10-CM | POA: Diagnosis not present

## 2020-09-29 DIAGNOSIS — G5692 Unspecified mononeuropathy of left upper limb: Secondary | ICD-10-CM

## 2020-09-29 DIAGNOSIS — M79645 Pain in left finger(s): Secondary | ICD-10-CM | POA: Diagnosis not present

## 2020-09-29 MED ORDER — BUPIVACAINE HCL 0.25 % IJ SOLN
0.3300 mL | INTRAMUSCULAR | Status: AC | PRN
  Administered 2020-09-29: .33 mL

## 2020-09-29 NOTE — Progress Notes (Signed)
Office Visit Note   Patient: [name]           Date of Birth: 22-Feb-1961           MRN: [id number] Visit Date: 09/29/2020              Requested by: Susy Frizzle, MD [address] [place],  [place] [address] PCP: Susy Frizzle, MD   Assessment & Plan: Visit Diagnoses:  1. Pain of left thumb   2. Localized primary osteoarthritis of carpometacarpal (CMC) joint of left wrist   3. Neuropathy of left hand   4. Tenosynovitis, de Quervain     Plan: Advised patient that I think she has multiple problems contributing to her left hand pain.  She has a known history of left carpal tunnel syndrome that has been treated by her primary care physician Dr. Jenna Luo with at least 2 carpal tunnel injections.  I recommend getting a NCV/EMG study to rule out carpal tunnel, and also nerve entrapment at Guyon's canal.  And also given her relief of her pain from her [facility] DJD offered injection.  Patient is sent areas prepped with Betadine and intra-articular Marcaine/betamethasone injection performed.  She did have some relief of her pain at that area but also still continued to have some discomfort where she may have de Quervain's tenosynovitis as well.  Follow-up with Dr. Lorin Mercy in 3 weeks for recheck to review her nerve study and also see how she is doing after injection today.  May consider trying a first dorsal compartment injection when she returns if needed.  Advised her to give the injection a couple of days to see how it does.  She may consider trying Voltaren gel twice daily.  Follow-Up Instructions: Return in about 3 weeks (around 10/20/2020) for with dr yates to review ncv/emg left UE and recheck left thumb pain.   Orders:  Orders Placed This Encounter  Procedures  . XR Finger Thumb Left  . Ambulatory referral to Physical Medicine Rehab   No orders of the defined types were placed in this encounter.     Procedures: Hand/UE Inj: L thumb CMC for osteoarthritis on  09/29/2020 2:05 PM Indications: pain Details: 25 G needle, radial approach Medications: 0.33 mL bupivacaine 0.25 % Outcome: tolerated well, no immediate complications  Marcaine and 1/2 cc of betamethasone Consent was given by the patient. Patient was prepped and draped in the usual sterile fashion.       Clinical Data: No additional findings.   Subjective: No chief complaint on file.   HPI 60 year old white female comes in today with complaints of left thumb and hand pain.  I reviewed patient's records in the system and she has been followed by her primary care physician Dr. Jenna Luo for left carpal tunnel syndrome.  She had carpal tunnel injections performed December 2020 and December 03, 2019.  In his last note he did recommend that she see hand specialist for ongoing issues.  She states that her pain has been worse over the last couple of months.  No injury.  She describes having some pain around her wrist that extends into her thumb.  Also complains of some discomfort ulnar aspect of the wrist with occasional pain shooting into the small finger.  Symptoms worse when she is gripping objects.  Localizes most of her gripping pain to the thumb CMC joint.  States that she has been taken Tylenol without any improvement. Review of Systems No  current cardiac pulmonary GI GU  Objective: Vital Signs: BP (!) 176/98   Pulse 73   Physical Exam HENT:     Head: Normocephalic and atraumatic.  Eyes:     Pupils: Pupils are equal, round, and reactive to light.  Pulmonary:     Effort: No respiratory distress.  Musculoskeletal:     Comments: Exam patient has a positive Tinel's over the carpal tunnel and Guyon's canal.  Negative Tinel's over the left cubital tunnel.  Tender over the thumb CMC joint.  Mildly positive CMC grind.  Mildly positive Finkelstein test.   Neurological:     Mental Status: She is alert and oriented to person, place, and time.  Psychiatric:        Mood and Affect: Mood  normal.     Ortho Exam  Specialty Comments:  No specialty comments available.  Imaging: No results found.   PMFS History: Patient Active Problem List   Diagnosis Date Noted  . Change in bowel habits 07/27/2020  . Chronic abdominal pain 07/27/2020  . Rectal bleeding 07/27/2020  . Aortic atherosclerosis ([place])   . COPD (chronic obstructive pulmonary disease) ([place]) 02/20/2019  . Chronic constipation 07/31/2018  . LUQ abdominal pain 07/31/2018  . Alcohol use disorder, mild, in early remission 07/31/2018  . Internal hemorrhoids 07/31/2018  . MRSA (methicillin resistant staph aureus) culture positive 06/15/2017  . Acute sinusitis 12/13/2013  . Acute bronchitis 12/13/2013  . Laceration of finger 10/06/2013  . PTSD (post-traumatic stress disorder)    Past Medical History:  Diagnosis Date  . Anxiety    BILATERAL HANDS-- LEFT >RIGHT AND LEGS  . Aortic atherosclerosis ([place])   . Arthritis   . Asthma    NO INHALER  . Bed bug bite   . Blood transfusion without reported diagnosis   . CA - skin cancer   . Chronic pain due to injury RIGHT HAND/WRIST IN 2007  . Complication of anesthesia    unable to urinate after surgery  . COPD (chronic obstructive pulmonary disease) ([facility])    Stage 1  . Crush injury of hand RIGHT IN 2007   X 6 SURG'S  LAST ONE BEING TOTAL WRIST FUSION W/ BONE GRAFT AND PLATE  . Cubital tunnel syndrome    Left elbow  . Depression   . ETOH abuse   . GERD (gastroesophageal reflux disease)    05/11/17- not recently  . Hemorrhoids   . History of kidney stones    passed  . MRSA (methicillin resistant Staphylococcus aureus) carrier   . Neuromuscular disorder ([facility])    some fibromyalgia  . Neuropathy   . Osteopenia 07/2019   T score -1.9 FRAX 8.2% / 1.2%  . PTSD (post-traumatic stress disorder) 2007 RIGHT HAND CRUSH INJURY W/ 2 FINGER AMPUTATION  . Stroke ([place]) X2 CVA'S  IN 1990   LOSS OF VISION RIGHT Peripherial bilteral  . Substance abuse ([place])    hx:  narcotic use  . UC (ulcerative colitis) ([place])   . Weakness of right leg CAUSED BY RETRIEVAL THIGH MUSCLE FOR FLAP RIGHT HAND INJURY-- 2006   RIGHT LEG GIVES OUT OCCASIONALLY    Family History  Problem Relation Age of Onset  . Lupus Mother        died at 9  . Breast cancer Maternal Aunt 35  . Breast cancer Maternal Grandmother 60  . Stomach cancer Paternal Grandfather   . Colon cancer Neg Hx   . Rectal cancer Neg Hx   . Esophageal cancer Neg  Hx   . Inflammatory bowel disease Neg Hx   . Liver disease Neg Hx   . Pancreatic cancer Neg Hx     Past Surgical History:  Procedure Laterality Date  . AMPUTATION Left 05/13/2017   Procedure: Left small finger revision amputation ;  Surgeon: Roseanne Kaufman, MD;  Location: [facility];  Service: Orthopedics;  Laterality: Left;  60 mins  . COLON SURGERY     15 years ago- fistula on colon removed  . COSMETIC SURGERY     right leg and abdomen  . DILATATION & CURETTAGE/HYSTEROSCOPY WITH MYOSURE N/A 06/14/2016   Procedure: [place];  Surgeon: Anastasio Auerbach, MD;  Location: [facility];  Service: Gynecology;  Laterality: N/A;  . HAND CARPECTOMY  12-24-2005   RIGHT-- INCLUDING  REMOVAL  WRIST WIRES AND I &D (EXICIOSNAL)  FOR OSTEOMYELITIS  . HYSTEROSCOPY  12.6.2012   w/removal of polyp  . HYSTEROSCOPY WITH D & C  08/18/2011   Procedure: DILATATION AND CURETTAGE (D&C) /HYSTEROSCOPY;  Surgeon: Anastasio Auerbach, MD;  Location: [facility];  Service: Gynecology;  Laterality: N/A;  . INCISION / DRAINAGE HAND / FINGER  X3  (2006 & 2007)   RIGHT CRUSH WOUND  . LESION REMOVAL  08/18/2011   Procedure: EXCISION VAGINAL LESION;  Surgeon: Anastasio Auerbach, MD;  Location: [facility];  Service: Gynecology;  Laterality: N/A;  . ORIF CARPAL BONE FRACTURE  08-31-05   I & D OF RIGHT HAND CRUSH INJURY/ OPEN FX'S AND INDEX FINGER RAY RESECTION/ COMPLICATED CLOSURE  . ORIF RADIAL FRACTURE  09-04-05    DISTAL RADIAL PLATE AND PARTIAL THUMB AMPUTATION  . RIGHT TOTAL WRIST FUSION W/ BONE GRAFT AND PLATE  [id number]   AND FLAP USING RIGHT GOIN MUSCLE  . skin cancer removal  2007   throat area  . TUBAL LIGATION  15 YRS AGO   Social History   Occupational History  . Occupation: Housekeeper   Tobacco Use  . Smoking status: Former Smoker    Packs/day: 1.50    Years: 28.00    Pack years: 42.00    Types: Cigarettes    Quit date: 11/29/2001    Years since quitting: 18.8  . Smokeless tobacco: Never Used  Vaping Use  . Vaping Use: Never used  Substance and Sexual Activity  . Alcohol use: Yes    Comment: SOCIAL   . Drug use: Yes    Types: Marijuana  . Sexual activity: Yes    Partners: Male    Birth control/protection: Post-menopausal    Comment: 1st intercourse 60 yo-More than 5 partners

## 2020-10-21 ENCOUNTER — Ambulatory Visit: Payer: Medicare Other | Admitting: Surgery

## 2020-10-23 ENCOUNTER — Other Ambulatory Visit: Payer: Self-pay

## 2020-10-23 ENCOUNTER — Ambulatory Visit: Payer: Medicare Other | Admitting: Physical Medicine and Rehabilitation

## 2020-10-23 ENCOUNTER — Encounter: Payer: Self-pay | Admitting: Physical Medicine and Rehabilitation

## 2020-10-23 DIAGNOSIS — R202 Paresthesia of skin: Secondary | ICD-10-CM | POA: Diagnosis not present

## 2020-10-23 NOTE — Progress Notes (Signed)
Pain in left thumb, left fifth finger, and both sides of left wrist. Had injection which made pain worse. No numbness or tingling. Right hand dominant No lotion per patient Numeric Pain Rating Scale and Functional Assessment Average Pain 7   In the last MONTH (on 0-10 scale) has pain interfered with the following?  1. General activity like being  able to carry out your everyday physical activities such as walking, climbing stairs, carrying groceries, or moving a chair?  Rating(10)

## 2020-10-28 ENCOUNTER — Ambulatory Visit: Payer: Medicare Other | Admitting: Orthopaedic Surgery

## 2020-10-28 ENCOUNTER — Encounter: Payer: Self-pay | Admitting: Orthopaedic Surgery

## 2020-10-28 ENCOUNTER — Ambulatory Visit: Payer: Self-pay

## 2020-10-28 VITALS — BP 165/84 | HR 59 | Ht 61.0 in | Wt 129.0 lb

## 2020-10-28 DIAGNOSIS — M542 Cervicalgia: Secondary | ICD-10-CM | POA: Diagnosis not present

## 2020-10-28 DIAGNOSIS — M4722 Other spondylosis with radiculopathy, cervical region: Secondary | ICD-10-CM | POA: Insufficient documentation

## 2020-10-28 DIAGNOSIS — G5602 Carpal tunnel syndrome, left upper limb: Secondary | ICD-10-CM

## 2020-10-28 NOTE — Procedures (Signed)
Impression: The above electrodiagnostic study is ABNORMAL and reveals evidence of a moderate left median nerve entrapment at the wrist (carpal tunnel syndrome) affecting sensory and motor components. Focal nerve entrapment, brachial plexopathy or cervical radiculopathy.   Recommendations: 1.  Follow-up with referring physician. 2.  Continue current management of symptoms.  ___________________________ Laurence Spates FAAPMR Board Certified, American Board of Physical Medicine and Rehabilitation    Nerve Conduction Studies Anti Sensory Summary Table   Stim Site NR Peak (ms) Norm Peak (ms) P-T Amp (V) Norm P-T Amp Site1 Site2 Delta-P (ms) Dist (cm) Vel (m/s) Norm Vel (m/s)  Left Median Acr Palm Anti Sensory (2nd Digit)  31.9C  Wrist    3.6 <3.6 29.8 >10 Wrist Palm 1.8 0.0    Palm    1.8 <2.0 22.6         Left Radial Anti Sensory (Base 1st Digit)  31.8C  Wrist    2.2 <3.1 23.6  Wrist Base 1st Digit 2.2 0.0    Left Ulnar Anti Sensory (5th Digit)  32.2C  Wrist    3.3 <3.7 23.8 >15.0 Wrist 5th Digit 3.3 14.0 42 >38   Motor Summary Table   Stim Site NR Onset (ms) Norm Onset (ms) O-P Amp (mV) Norm O-P Amp Site1 Site2 Delta-0 (ms) Dist (cm) Vel (m/s) Norm Vel (m/s)  Left Median Motor (Abd Poll Brev)  31.9C    Martin-Gruber  Wrist    *4.4 <4.2 *3.2 >5 Elbow Wrist 3.7 18.0 *49 >50  Elbow    8.1  4.6         Left Ulnar Motor (Abd Dig Min)  32.1C  Wrist    2.7 <4.2 5.8 >3 B Elbow Wrist 2.9 16.5 57 >53  B Elbow    5.6  6.0  A Elbow B Elbow 1.8 8.5 *47 >53  A Elbow    7.4  5.9          EMG   Side Muscle Nerve Root Ins Act Fibs Psw Amp Dur Poly Recrt Int Fraser Din Comment  Left Abd Poll Brev Median C8-T1 Nml Nml Nml Nml Nml 0 Nml Nml   Left 1stDorInt Ulnar C8-T1 Nml Nml Nml Nml Nml 0 Nml Nml   Left PronatorTeres Median C6-7 Nml Nml Nml Nml Nml 0 Nml Nml   Left Biceps Musculocut C5-6 Nml Nml Nml Nml Nml 0 Nml Nml   Left Deltoid Axillary C5-6 Nml Nml Nml Nml Nml 0 Nml Nml     Nerve Conduction  Studies Anti Sensory Left/Right Comparison   Stim Site L Lat (ms) R Lat (ms) L-R Lat (ms) L Amp (V) R Amp (V) L-R Amp (%) Site1 Site2 L Vel (m/s) R Vel (m/s) L-R Vel (m/s)  Median Acr Palm Anti Sensory (2nd Digit)  31.9C  Wrist 3.6   29.8   Wrist Palm     Palm 1.8   22.6         Radial Anti Sensory (Base 1st Digit)  31.8C  Wrist 2.2   23.6   Wrist Base 1st Digit     Ulnar Anti Sensory (5th Digit)  32.2C  Wrist 3.3   23.8   Wrist 5th Digit 42     Motor Left/Right Comparison   Stim Site L Lat (ms) R Lat (ms) L-R Lat (ms) L Amp (mV) R Amp (mV) L-R Amp (%) Site1 Site2 L Vel (m/s) R Vel (m/s) L-R Vel (m/s)  Median Motor (Abd Poll Brev)  31.9C    Martin-Gruber  Wrist *  4.4   *3.2   Elbow Wrist *49    Elbow 8.1   4.6         Ulnar Motor (Abd Dig Min)  32.1C  Wrist 2.7   5.8   B Elbow Wrist 57    B Elbow 5.6   6.0   A Elbow B Elbow *47    A Elbow 7.4   5.9            Waveforms:

## 2020-10-28 NOTE — Progress Notes (Unsigned)
Office Visit Note   Patient: [name]           Date of Birth: 04/26/1961           MRN: [id number] Visit Date: 10/28/2020              Requested by: Susy Frizzle, MD [address] [place],  [place] [address] PCP: Susy Frizzle, MD   Assessment & Plan: Visit Diagnoses:  1. Neck pain   2. Other spondylosis with radiculopathy, cervical region   3. Carpal tunnel syndrome, left upper limb     Plan: We will place patient in [place] wrist splint.  With the right thumb and index finger amputation and flap reconstruction she is now left-hand-dominant and she can use a splint Left moderate carpal tunnel syndrome by electrical test which were reviewed with her today.  We will recheck her in 1 month.  We discussed with her that some of her symptoms may be related to her neck although the EMGs did not show any evidence of radiculopathy.  Should you splint at night and intermittently during the day, [place] and recheck 1 month.  Follow-Up Instructions: Return in about 1 month (around 11/25/2020).   Orders:  Orders Placed This Encounter  Procedures  . XR Cervical Spine 2 or 3 views   No orders of the defined types were placed in this encounter.     Procedures: No procedures performed   Clinical Data: No additional findings.   Subjective: Chief Complaint  Patient presents with  . Left Wrist - Pain  . Left Thumb - Pain    HPI 60 year old female returns post electrical test which are moderate positive carpal tunnel syndrome on the left.  She continues to have pain decreased grip numbness that radiates from her neck down to the radial side of her hand most of the thumb.  Previous injection CMC joint with some temporary improvement.  Past history of traumatic amputation right thumb and index finger from [employer] injury.  She had 20 some odd surgeries with groin flap ultimate reconstruction, problems with flap engorgement, surgical sterile reaches, leg MRSA  infection.  No recent surgeries last several years.  She is off pain medication she is now left-hand-dominant notices decreased grip.  Numbness in the thumb bothers her at night she did not use any splints.  Previous carpal tunnel injections at least twice S with temporary relief.  He does have problems with COPD, PTSD  Review of Systems all other systems negative to HPI.   Objective: Vital Signs: BP (!) 165/84   Pulse (!) 59   Ht 5' 1"  (1.549 m)   Wt 129 lb (58.5 kg)   BMI 24.37 kg/m   Physical Exam Constitutional:      Appearance: She is well-developed.  HENT:     Head: Normocephalic.     Right Ear: External ear normal.     Left Ear: External ear normal.  Eyes:     Pupils: Pupils are equal, round, and reactive to light.  Neck:     Thyroid: No thyromegaly.     Trachea: No tracheal deviation.  Cardiovascular:     Rate and Rhythm: Normal rate.  Pulmonary:     Effort: Pulmonary effort is normal.  Abdominal:     Palpations: Abdomen is soft.  Skin:    General: Skin is warm and dry.  Neurological:     Mental Status: She is alert and oriented to person, place,  and time.  Psychiatric:        Mood and Affect: Mood and affect normal.        Behavior: Behavior normal.     Ortho Exam patient has negative left thumb grind test negative Finkelstein.  Some pain with carpal compression moderate on the left mild on the right.  No thenar or hypothenar atrophy. Specialty Comments:  No specialty comments available.  Imaging: No results found.   PMFS History: Patient Active Problem List   Diagnosis Date Noted  . Other spondylosis with radiculopathy, cervical region 10/28/2020  . Carpal tunnel syndrome, left upper limb 10/28/2020  . Change in bowel habits 07/27/2020  . Chronic abdominal pain 07/27/2020  . Rectal bleeding 07/27/2020  . Aortic atherosclerosis ([place])   . COPD (chronic obstructive pulmonary disease) ([place]) 02/20/2019  . Chronic constipation 07/31/2018  . LUQ  abdominal pain 07/31/2018  . Alcohol use disorder, mild, in early remission 07/31/2018  . Internal hemorrhoids 07/31/2018  . MRSA (methicillin resistant staph aureus) culture positive 06/15/2017  . Acute sinusitis 12/13/2013  . Acute bronchitis 12/13/2013  . Laceration of finger 10/06/2013  . PTSD (post-traumatic stress disorder)    Past Medical History:  Diagnosis Date  . Anxiety    BILATERAL HANDS-- LEFT >RIGHT AND LEGS  . Aortic atherosclerosis ([place])   . Arthritis   . Asthma    NO INHALER  . Bed bug bite   . Blood transfusion without reported diagnosis   . CA - skin cancer   . Chronic pain due to injury RIGHT HAND/WRIST IN 2007  . Complication of anesthesia    unable to urinate after surgery  . COPD (chronic obstructive pulmonary disease) (HCC)    Stage 1  . Crush injury of hand RIGHT IN 2007   X 6 SURG'S  LAST ONE BEING TOTAL WRIST FUSION W/ BONE GRAFT AND PLATE  . Cubital tunnel syndrome    Left elbow  . Depression   . ETOH abuse   . GERD (gastroesophageal reflux disease)    05/11/17- not recently  . Hemorrhoids   . History of kidney stones    passed  . MRSA (methicillin resistant Staphylococcus aureus) carrier   . Neuromuscular disorder (HCC)    some fibromyalgia  . Neuropathy   . Osteopenia 07/2019   T score -1.9 FRAX 8.2% / 1.2%  . PTSD (post-traumatic stress disorder) 2007 RIGHT HAND CRUSH INJURY W/ 2 FINGER AMPUTATION  . Stroke ([place]) X2 CVA'S  IN 1990   LOSS OF VISION RIGHT Peripherial bilteral  . Substance abuse ([place])    hx: narcotic use  . UC (ulcerative colitis) ([place])   . Weakness of right leg CAUSED BY RETRIEVAL THIGH MUSCLE FOR FLAP RIGHT HAND INJURY-- 2006   RIGHT LEG GIVES OUT OCCASIONALLY    Family History  Problem Relation Age of Onset  . Lupus Mother        died at 36  . Breast cancer Maternal Aunt 35  . Breast cancer Maternal Grandmother 60  . Stomach cancer Paternal Grandfather   . Colon cancer Neg Hx   . Rectal cancer Neg Hx   .  Esophageal cancer Neg Hx   . Inflammatory bowel disease Neg Hx   . Liver disease Neg Hx   . Pancreatic cancer Neg Hx     Past Surgical History:  Procedure Laterality Date  . AMPUTATION Left 05/13/2017   Procedure: Left small finger revision amputation ;  Surgeon: Roseanne Kaufman, MD;  Location: [facility];  Service: Orthopedics;  Laterality: Left;  60 mins  . COLON SURGERY     15 years ago- fistula on colon removed  . COSMETIC SURGERY     right leg and abdomen  . DILATATION & CURETTAGE/HYSTEROSCOPY WITH MYOSURE N/A 06/14/2016   Procedure: [place];  Surgeon: Anastasio Auerbach, MD;  Location: [facility];  Service: Gynecology;  Laterality: N/A;  . HAND CARPECTOMY  12-24-2005   RIGHT-- INCLUDING  REMOVAL  WRIST WIRES AND I &D (EXICIOSNAL)  FOR OSTEOMYELITIS  . HYSTEROSCOPY  12.6.2012   w/removal of polyp  . HYSTEROSCOPY WITH D & C  08/18/2011   Procedure: DILATATION AND CURETTAGE (D&C) /HYSTEROSCOPY;  Surgeon: Anastasio Auerbach, MD;  Location: [facility];  Service: Gynecology;  Laterality: N/A;  . INCISION / DRAINAGE HAND / FINGER  X3  (2006 & 2007)   RIGHT CRUSH WOUND  . LESION REMOVAL  08/18/2011   Procedure: EXCISION VAGINAL LESION;  Surgeon: Anastasio Auerbach, MD;  Location: [facility];  Service: Gynecology;  Laterality: N/A;  . ORIF CARPAL BONE FRACTURE  08-31-05   I & D OF RIGHT HAND CRUSH INJURY/ OPEN FX'S AND INDEX FINGER RAY RESECTION/ COMPLICATED CLOSURE  . ORIF RADIAL FRACTURE  09-04-05   DISTAL RADIAL PLATE AND PARTIAL THUMB AMPUTATION  . RIGHT TOTAL WRIST FUSION W/ BONE GRAFT AND PLATE  [id number]   AND FLAP USING RIGHT GOIN MUSCLE  . skin cancer removal  2007   throat area  . TUBAL LIGATION  15 YRS AGO   Social History   Occupational History  . Occupation: Housekeeper   Tobacco Use  . Smoking status: Former Smoker    Packs/day: 1.50    Years: 28.00    Pack years: 42.00    Types: Cigarettes    Quit  date: 11/29/2001    Years since quitting: 18.9  . Smokeless tobacco: Never Used  Vaping Use  . Vaping Use: Never used  Substance and Sexual Activity  . Alcohol use: Yes    Comment: SOCIAL   . Drug use: Yes    Types: Marijuana  . Sexual activity: Yes    Partners: Male    Birth control/protection: Post-menopausal    Comment: 1st intercourse 59 yo-More than 5 partners

## 2020-10-28 NOTE — Progress Notes (Signed)
[name] - 60 y.o. female MRN [id number]  Date of birth: 1961-01-18  Office Visit Note: Visit Date: 10/23/2020 PCP: Susy Frizzle, MD Referred by: Susy Frizzle, MD  Subjective: Chief Complaint  Patient presents with  . Left Hand - Pain   HPI:  [name] is a 60 y.o. female who comes in today at the request of Dr. Rodell Perna for electrodiagnostic study of the Left upper extremities.  Patient is Right hand dominant. Pain in left thumb, left fifth finger, and both sides of left wrist. Had injection which made pain worse. No numbness or tingling.    ROS Otherwise per HPI.  Assessment & Plan: Visit Diagnoses:    ICD-10-CM   1. Paresthesia of skin  R20.2 NCV with EMG (electromyography)    Plan: Impression: The above electrodiagnostic study is ABNORMAL and reveals evidence of a moderate left median nerve entrapment at the wrist (carpal tunnel syndrome) affecting sensory and motor components. Focal nerve entrapment, brachial plexopathy or cervical radiculopathy.   Recommendations: 1.  Follow-up with referring physician. 2.  Continue current management of symptoms.  Meds & Orders: No orders of the defined types were placed in this encounter.   Orders Placed This Encounter  Procedures  . NCV with EMG (electromyography)    Follow-up: Return for Rodell Perna, MD.   Procedures: No procedures performed  Impression: The above electrodiagnostic study is ABNORMAL and reveals evidence of a moderate left median nerve entrapment at the wrist (carpal tunnel syndrome) affecting sensory and motor components. Focal nerve entrapment, brachial plexopathy or cervical radiculopathy.   Recommendations: 1.  Follow-up with referring physician. 2.  Continue current management of symptoms.  ___________________________ Laurence Spates FAAPMR Board Certified, American Board of Physical Medicine and Rehabilitation    Nerve Conduction Studies Anti Sensory Summary Table   Stim  Site NR Peak (ms) Norm Peak (ms) P-T Amp (V) Norm P-T Amp Site1 Site2 Delta-P (ms) Dist (cm) Vel (m/s) Norm Vel (m/s)  Left Median Acr Palm Anti Sensory (2nd Digit)  31.9C  Wrist    3.6 <3.6 29.8 >10 Wrist Palm 1.8 0.0    Palm    1.8 <2.0 22.6         Left Radial Anti Sensory (Base 1st Digit)  31.8C  Wrist    2.2 <3.1 23.6  Wrist Base 1st Digit 2.2 0.0    Left Ulnar Anti Sensory (5th Digit)  32.2C  Wrist    3.3 <3.7 23.8 >15.0 Wrist 5th Digit 3.3 14.0 42 >38   Motor Summary Table   Stim Site NR Onset (ms) Norm Onset (ms) O-P Amp (mV) Norm O-P Amp Site1 Site2 Delta-0 (ms) Dist (cm) Vel (m/s) Norm Vel (m/s)  Left Median Motor (Abd Poll Brev)  31.9C    Martin-Gruber  Wrist    *4.4 <4.2 *3.2 >5 Elbow Wrist 3.7 18.0 *49 >50  Elbow    8.1  4.6         Left Ulnar Motor (Abd Dig Min)  32.1C  Wrist    2.7 <4.2 5.8 >3 B Elbow Wrist 2.9 16.5 57 >53  B Elbow    5.6  6.0  A Elbow B Elbow 1.8 8.5 *47 >53  A Elbow    7.4  5.9          EMG   Side Muscle Nerve Root Ins Act Fibs Psw Amp Dur Poly Recrt Int Fraser Din Comment  Left Abd Poll Brev Median C8-T1 Nml Nml Nml Nml Nml  0 Nml Nml   Left 1stDorInt Ulnar C8-T1 Nml Nml Nml Nml Nml 0 Nml Nml   Left PronatorTeres Median C6-7 Nml Nml Nml Nml Nml 0 Nml Nml   Left Biceps Musculocut C5-6 Nml Nml Nml Nml Nml 0 Nml Nml   Left Deltoid Axillary C5-6 Nml Nml Nml Nml Nml 0 Nml Nml     Nerve Conduction Studies Anti Sensory Left/Right Comparison   Stim Site L Lat (ms) R Lat (ms) L-R Lat (ms) L Amp (V) R Amp (V) L-R Amp (%) Site1 Site2 L Vel (m/s) R Vel (m/s) L-R Vel (m/s)  Median Acr Palm Anti Sensory (2nd Digit)  31.9C  Wrist 3.6   29.8   Wrist Palm     Palm 1.8   22.6         Radial Anti Sensory (Base 1st Digit)  31.8C  Wrist 2.2   23.6   Wrist Base 1st Digit     Ulnar Anti Sensory (5th Digit)  32.2C  Wrist 3.3   23.8   Wrist 5th Digit 42     Motor Left/Right Comparison   Stim Site L Lat (ms) R Lat (ms) L-R Lat (ms) L Amp (mV) R Amp (mV) L-R Amp  (%) Site1 Site2 L Vel (m/s) R Vel (m/s) L-R Vel (m/s)  Median Motor (Abd Poll Brev)  31.9C    Martin-Gruber  Wrist *4.4   *3.2   Elbow Wrist *49    Elbow 8.1   4.6         Ulnar Motor (Abd Dig Min)  32.1C  Wrist 2.7   5.8   B Elbow Wrist 57    B Elbow 5.6   6.0   A Elbow B Elbow *47    A Elbow 7.4   5.9            Waveforms:             Clinical History: No specialty comments available.     Objective:  VS:  HT:    WT:   BMI:     BP:   HR: bpm  TEMP: ( )  RESP:  Physical Exam Musculoskeletal:        General: No swelling, tenderness or deformity.     Comments: Inspection reveals no atrophy of the bilateral APB or FDI or hand intrinsics. There is no swelling, color changes, allodynia or dystrophic changes. There is 5 out of 5 strength in the bilateral wrist extension, finger abduction and long finger flexion. There is intact sensation to light touch in all dermatomal and peripheral nerve distributions.  There is a negative Phalen's test bilaterally. There is a negative Hoffmann's test bilaterally.  Skin:    General: Skin is warm and dry.     Findings: No erythema or rash.  Neurological:     General: No focal deficit present.     Mental Status: She is alert and oriented to person, place, and time.     Motor: No weakness or abnormal muscle tone.     Coordination: Coordination normal.  Psychiatric:        Mood and Affect: Mood normal.        Behavior: Behavior normal.      Imaging: No results found.

## 2020-11-12 ENCOUNTER — Ambulatory Visit (INDEPENDENT_AMBULATORY_CARE_PROVIDER_SITE_OTHER): Payer: Medicare Other | Admitting: Family Medicine

## 2020-11-12 ENCOUNTER — Other Ambulatory Visit: Payer: Self-pay

## 2020-11-12 ENCOUNTER — Encounter: Payer: Self-pay | Admitting: Family Medicine

## 2020-11-12 VITALS — BP 138/84 | HR 66 | Temp 99.3°F | Resp 14 | Ht 61.0 in | Wt 133.0 lb

## 2020-11-12 DIAGNOSIS — J438 Other emphysema: Secondary | ICD-10-CM | POA: Diagnosis not present

## 2020-11-12 DIAGNOSIS — Z13220 Encounter for screening for lipoid disorders: Secondary | ICD-10-CM

## 2020-11-12 DIAGNOSIS — R1084 Generalized abdominal pain: Secondary | ICD-10-CM

## 2020-11-12 DIAGNOSIS — M5412 Radiculopathy, cervical region: Secondary | ICD-10-CM | POA: Diagnosis not present

## 2020-11-12 MED ORDER — GABAPENTIN 300 MG PO CAPS: 300.0000 mg | ORAL_CAPSULE | Freq: Three times a day (TID) | ORAL | 3 refills | Status: DC | PRN

## 2020-11-12 MED ORDER — INCRUSE ELLIPTA 62.5 MCG/INH IN AEPB
1.0000 | INHALATION_SPRAY | Freq: Every day | RESPIRATORY_TRACT | 11 refills | Status: DC
Start: 2020-11-12 — End: 2021-06-23

## 2020-11-12 NOTE — Progress Notes (Signed)
Subjective:    Patient ID: [name], female    DOB: 01-15-1961, 60 y.o.   MRN: [id number]  Patient has a history of COPD.  She is no longer smoking cigarettes.  She occasionally smokes marijuana.  She has found the Incruse seems to work the best for her.  She is tried Trelegy in the past which only irritated her breathing.  She has tried Symbicort which did not work as well and made her have thrush.  She has tried several other samples including Breo which did not work well for her.  However Incruse seems to help her shortness of breath and dyspnea on exertion more than anything else.  She is asking a refill on this.  She is also been dealing with neuropathic pain in her left arm.  She recently saw an orthopedic surgeon who performed cervical spine films which show degenerative disc disease in the cervical spine.  She reports that shooting lancing pain radiating down her left arm into her left hand.  They initially tried treating her for carpal tunnel without benefit and they have now told her that it is likely cervical radiculopathy.  In the past she has been on gabapentin for neuropathic pain and she would like to try this again to see if she can manage the pain without having to have surgery.  She is also overdue for fasting lab work.  She continues have chronic diarrhea and bloating abdominal pain.  Her last colonoscopy raise concern about possible inflammatory bowel disease.  She recently saw GI who recommended a repeat colonoscopy.  She has been hesitant to do this and I tried to encourage her given the fact that she is having approximately 8 loose stools a day and bloating cramping abdominal pain.  Past Medical History:  Diagnosis Date  . Anxiety    BILATERAL HANDS-- LEFT >RIGHT AND LEGS  . Aortic atherosclerosis ([place])   . Arthritis   . Asthma    NO INHALER  . Bed bug bite   . Blood transfusion without reported diagnosis   . CA - skin cancer   . Chronic pain due to injury RIGHT  HAND/WRIST IN 2007  . Complication of anesthesia    unable to urinate after surgery  . COPD (chronic obstructive pulmonary disease) (HCC)    Stage 1  . Crush injury of hand RIGHT IN 2007   X 6 SURG'S  LAST ONE BEING TOTAL WRIST FUSION W/ BONE GRAFT AND PLATE  . Cubital tunnel syndrome    Left elbow  . Depression   . ETOH abuse   . GERD (gastroesophageal reflux disease)    05/11/17- not recently  . Hemorrhoids   . History of kidney stones    passed  . MRSA (methicillin resistant Staphylococcus aureus) carrier   . Neuromuscular disorder (HCC)    some fibromyalgia  . Neuropathy   . Osteopenia 07/2019   T score -1.9 FRAX 8.2% / 1.2%  . PTSD (post-traumatic stress disorder) 2007 RIGHT HAND CRUSH INJURY W/ 2 FINGER AMPUTATION  . Stroke ([place]) X2 CVA'S  IN 1990   LOSS OF VISION RIGHT Peripherial bilteral  . Substance abuse ([place])    hx: narcotic use  . UC (ulcerative colitis) ([place])   . Weakness of right leg CAUSED BY RETRIEVAL THIGH MUSCLE FOR FLAP RIGHT HAND INJURY-- 2006   RIGHT LEG GIVES OUT OCCASIONALLY   Past Surgical History:  Procedure Laterality Date  . AMPUTATION Left 05/13/2017   Procedure: Left small finger  revision amputation ;  Surgeon: Roseanne Kaufman, MD;  Location: [facility];  Service: Orthopedics;  Laterality: Left;  60 mins  . COLON SURGERY     15 years ago- fistula on colon removed  . COSMETIC SURGERY     right leg and abdomen  . DILATATION & CURETTAGE/HYSTEROSCOPY WITH MYOSURE N/A 06/14/2016   Procedure: [place];  Surgeon: Anastasio Auerbach, MD;  Location: [facility];  Service: Gynecology;  Laterality: N/A;  . HAND CARPECTOMY  12-24-2005   RIGHT-- INCLUDING  REMOVAL  WRIST WIRES AND I &D (EXICIOSNAL)  FOR OSTEOMYELITIS  . HYSTEROSCOPY  12.6.2012   w/removal of polyp  . HYSTEROSCOPY WITH D & C  08/18/2011   Procedure: DILATATION AND CURETTAGE (D&C) /HYSTEROSCOPY;  Surgeon: Anastasio Auerbach, MD;  Location: [facility];  Service: Gynecology;  Laterality: N/A;  . INCISION / DRAINAGE HAND / FINGER  X3  (2006 & 2007)   RIGHT CRUSH WOUND  . LESION REMOVAL  08/18/2011   Procedure: EXCISION VAGINAL LESION;  Surgeon: Anastasio Auerbach, MD;  Location: [facility];  Service: Gynecology;  Laterality: N/A;  . ORIF CARPAL BONE FRACTURE  08-31-05   I & D OF RIGHT HAND CRUSH INJURY/ OPEN FX'S AND INDEX FINGER RAY RESECTION/ COMPLICATED CLOSURE  . ORIF RADIAL FRACTURE  09-04-05   DISTAL RADIAL PLATE AND PARTIAL THUMB AMPUTATION  . RIGHT TOTAL WRIST FUSION W/ BONE GRAFT AND PLATE  [id number]   AND FLAP USING RIGHT GOIN MUSCLE  . skin cancer removal  2007   throat area  . TUBAL LIGATION  15 YRS AGO   Current Outpatient Medications on File Prior to Visit  Medication Sig Dispense Refill  . albuterol (VENTOLIN HFA) 108 (90 Base) MCG/ACT inhaler TAKE 2 PUFFS BY MOUTH EVERY 6 HOURS AS NEEDED FOR WHEEZE OR SHORTNESS OF BREATH 8.5 each 2  . BIOTIN PO Take by mouth.    . dicyclomine (BENTYL) 20 MG tablet TAKE 1 TABLET BY MOUTH 4 TIMES DAILY - BEFORE MEALS AND AT BEDTIME. 360 tablet 1  . Na Sulfate-K Sulfate-Mg Sulf (SUPREP BOWEL PREP KIT) 17.5-3.13-1.6 GM/177ML SOLN Take 1 kit by mouth as directed. For colonoscopy prep 354 mL 0  . NONFORMULARY OR COMPOUNDED ITEM Estradiol vaginal cream 6.75% insert 1 applicator twice weekly 90 each 4  . pantoprazole (PROTONIX) 40 MG tablet TAKE 1 TABLET BY MOUTH EVERY DAY 90 tablet 1   No current facility-administered medications on file prior to visit.   Allergies  Allergen Reactions  . Aspirin Shortness Of Breath    ASTHMATIC ATTACK  . Codeine Other (See Comments)    ABNORMAL BEHAVIOR  Increased in bad moods  NO CODEINE BASED MEDICATIONS   . Vicodin [Hydrocodone-Acetaminophen] Other (See Comments)    Extreme anxiety   Social History   Socioeconomic History  . Marital status: Married    Spouse name: Not on file  . Number of children: 6  . Years of  education: Not on file  . Highest education level: Not on file  Occupational History  . Occupation: Housekeeper   Tobacco Use  . Smoking status: Former Smoker    Packs/day: 1.50    Years: 28.00    Pack years: 42.00    Types: Cigarettes    Quit date: 11/29/2001    Years since quitting: 18.9  . Smokeless tobacco: Never Used  Vaping Use  . Vaping Use: Never used  Substance and Sexual Activity  . Alcohol use:  Yes    Comment: SOCIAL   . Drug use: Yes    Types: Marijuana  . Sexual activity: Yes    Partners: Male    Birth control/protection: Post-menopausal    Comment: 1st intercourse 60 yo-More than 5 partners  Other Topics Concern  . Not on file  Social History Narrative  . Not on file   Social Determinants of Health   Financial Resource Strain: Not on file  Food Insecurity: Not on file  Transportation Needs: Not on file  Physical Activity: Not on file  Stress: Not on file  Social Connections: Not on file  Intimate Partner Violence: Not on file     Review of Systems  Gastrointestinal: Positive for abdominal pain.  All other systems reviewed and are negative.      Objective:   Physical Exam Vitals reviewed.  Cardiovascular:     Rate and Rhythm: Normal rate and regular rhythm.     Heart sounds: Normal heart sounds. No murmur heard.   Pulmonary:     Effort: Pulmonary effort is normal. No respiratory distress.     Breath sounds: Normal breath sounds. Decreased air movement present. No stridor. No wheezing, rhonchi or rales.  Abdominal:     General: Bowel sounds are normal. There is no distension.     Palpations: Abdomen is soft. There is no mass.     Tenderness: There is no abdominal tenderness. There is no guarding or rebound.           Assessment & Plan:  Generalized abdominal pain  Other emphysema (HCC)  Cervical radiculopathy  Screening for cholesterol level  Patient has generalized abdominal pain and frequent diarrhea.  I am concerned about  inflammatory bowel disease versus IBS.  I encouraged her to follow-up with GI to consider colonoscopy.  Her emphysema seems well controlled on Incruse I will refill that medication.  Encouraged her to stay away from all types of smoking.  I do believe it sounds like she may have cervical radiculopathy.  I will defer to the orthopedist about management strategies however we will try gabapentin 300 mg p.o. 3 times daily as needed nerve pain to see if this will help.  Of asked her to return fasting for CBC, CMP, and fasting lipid panel.

## 2020-11-18 ENCOUNTER — Other Ambulatory Visit: Payer: Medicare Other

## 2020-11-18 ENCOUNTER — Other Ambulatory Visit: Payer: Self-pay

## 2020-11-18 DIAGNOSIS — Z136 Encounter for screening for cardiovascular disorders: Secondary | ICD-10-CM | POA: Diagnosis not present

## 2020-11-18 DIAGNOSIS — Z13220 Encounter for screening for lipoid disorders: Secondary | ICD-10-CM | POA: Diagnosis not present

## 2020-11-19 LAB — COMPLETE METABOLIC PANEL WITH GFR
AG Ratio: 1.8 (calc) (ref 1.0–2.5)
ALT: 13 U/L (ref 6–29)
AST: 22 U/L (ref 10–35)
Albumin: 4.4 g/dL (ref 3.6–5.1)
Alkaline phosphatase (APISO): 67 U/L (ref 37–153)
BUN: 15 mg/dL (ref 7–25)
CO2: 26 mmol/L (ref 20–32)
Calcium: 9.9 mg/dL (ref 8.6–10.4)
Chloride: 105 mmol/L (ref 98–110)
Creat: 0.86 mg/dL (ref 0.50–0.99)
GFR, Est African American: 85 mL/min/{1.73_m2} (ref >=60)
GFR, Est Non African American: 73 mL/min/{1.73_m2} (ref >=60)
Globulin: 2.4 g/dL (calc) (ref 1.9–3.7)
Glucose, Bld: 80 mg/dL (ref 65–99)
Potassium: 4.8 mmol/L (ref 3.5–5.3)
Sodium: 143 mmol/L (ref 135–146)
Total Bilirubin: 0.6 mg/dL (ref 0.2–1.2)
Total Protein: 6.8 g/dL (ref 6.1–8.1)

## 2020-11-19 LAB — CBC WITH DIFFERENTIAL/PLATELET
Absolute Monocytes: 1081 cells/uL — ABNORMAL HIGH (ref 200–950)
Basophils Absolute: 86 cells/uL (ref 0–200)
Basophils Relative: 0.8 %
Eosinophils Absolute: 310 cells/uL (ref 15–500)
Eosinophils Relative: 2.9 %
HCT: 40.4 % (ref 35.0–45.0)
Hemoglobin: 13.4 g/dL (ref 11.7–15.5)
Lymphs Abs: 3724 cells/uL (ref 850–3900)
MCH: 32.1 pg (ref 27.0–33.0)
MCHC: 33.2 g/dL (ref 32.0–36.0)
MCV: 96.9 fL (ref 80.0–100.0)
MPV: 9.9 fL (ref 7.5–12.5)
Monocytes Relative: 10.1 %
Neutro Abs: 5500 cells/uL (ref 1500–7800)
Neutrophils Relative %: 51.4 %
Platelets: 351 10*3/uL (ref 140–400)
RBC: 4.17 10*6/uL (ref 3.80–5.10)
RDW: 12.1 % (ref 11.0–15.0)
Total Lymphocyte: 34.8 %
WBC: 10.7 10*3/uL (ref 3.8–10.8)

## 2020-11-19 LAB — LIPID PANEL
Cholesterol: 255 mg/dL — ABNORMAL HIGH (ref <200)
HDL: 65 mg/dL (ref >=50)
LDL Cholesterol (Calc): 153 mg/dL (calc) — ABNORMAL HIGH
Non-HDL Cholesterol (Calc): 190 mg/dL (calc) — ABNORMAL HIGH (ref <130)
Total CHOL/HDL Ratio: 3.9 (calc) (ref <5.0)
Triglycerides: 208 mg/dL — ABNORMAL HIGH (ref <150)

## 2020-11-23 ENCOUNTER — Other Ambulatory Visit: Payer: Self-pay | Admitting: *Deleted

## 2020-11-23 MED ORDER — ROSUVASTATIN CALCIUM 10 MG PO TABS: 10.0000 mg | ORAL_TABLET | Freq: Every day | ORAL | 3 refills | Status: DC

## 2020-11-25 ENCOUNTER — Ambulatory Visit: Payer: Medicare Other | Admitting: Orthopaedic Surgery

## 2020-11-25 VITALS — Ht 61.0 in | Wt 133.0 lb

## 2020-11-25 DIAGNOSIS — G5602 Carpal tunnel syndrome, left upper limb: Secondary | ICD-10-CM

## 2020-11-25 NOTE — Progress Notes (Signed)
Office Visit Note   Patient: [name]           Date of Birth: [birth year]           MRN: [id number] Visit Date: 11/25/2020              Requested by: Susy Frizzle, MD [address] [place],  [place] [address] PCP: Susy Frizzle, MD   Assessment & Plan: Visit Diagnoses:  1. Carpal tunnel syndrome, left upper limb     Plan: Patient has moderately severe left carpal tunnel failed splinting anti-inflammatories rest activities.  Since she has had traumatic amputation of the opposite thumb and index fingers her left hand is her dominant hand and she does most things left-handed with right hand is a helper hand.  Patient had moderate to severe carpal tunnel on electrical test and plan would be carpal tunnel release.  She understands that the plain radiographs shows cervical spondylosis at C5-6 but her physical exam suggest this is primarily coming from the carpal tunnel and not cervical root compression at C5-6 level.  We will proceed with carpal tunnel release as an outpatient procedure.  We discussed elevation of the hand afterwards.  Patient understands operative procedure discussed.  Risk surgery was discussed in detail she understands request to proceed.  Decision for surgery made today.  Follow-Up Instructions: No follow-ups on file.   Orders:  No orders of the defined types were placed in this encounter.  No orders of the defined types were placed in this encounter.     Procedures: No procedures performed   Clinical Data: No additional findings.   Subjective: Chief Complaint  Patient presents with  . Neck - Follow-up  . Left Wrist - Follow-up    HPI 60 year old female returns persistent problems with left hand numbness.  She tried wearing the splint but states she has to take it off it tends to make some more symptoms worse.  She has been placed on some gabapentin by her PCP and she feels like this is giving her slight improvement.  Electrical test  showed moderate to severe carpal tunnel.  Plain radiographs did show spondylosis C5-6 on today's visit we reviewed all previous studies and images and discussed treatment options.  Patient states she is ready to proceed with  Left carpal tunnel release.  Review of Systems 14 point system update.  Cervical spondylosis C5-6 history of MSSA.  Traumatic absence of opposite right thumb with flap multiple surgeries. All other systems noncontributory to HPI.  Objective: Vital Signs: Ht 5' 1"  (1.549 m)   Wt 133 lb (60.3 kg)   BMI 25.13 kg/m   Physical Exam Constitutional:      Appearance: She is well-developed.  HENT:     Head: Normocephalic.     Right Ear: External ear normal.     Left Ear: External ear normal.  Eyes:     Pupils: Pupils are equal, round, and reactive to light.  Neck:     Thyroid: No thyromegaly.     Trachea: No tracheal deviation.  Cardiovascular:     Rate and Rhythm: Normal rate.  Pulmonary:     Effort: Pulmonary effort is normal.  Abdominal:     Palpations: Abdomen is soft.  Skin:    General: Skin is warm and dry.  Neurological:     Mental Status: She is alert and oriented to person, place, and time.  Psychiatric:        Behavior:  Behavior normal.     Ortho Exam traumatic Thumb and Index.  She Is Free Flap.  Good Flexion-Extension of Right Long Ring and Small Finger.  Mild Thenar Atrophy on the Left Positive Carpal Compression Test Positive Phalen's on the Left.  Decreased Two-Point Radial 3 and Half Fingers Normal on the Small Finger.  Ulnar Nerve at the Elbow Is Normal.  No Brachial Plexus Tenderness.  Mild Discomfort with Spurling Test on the Left Negative on the Right.  No Increased Pain with Cervical Compression.  Specialty Comments:  No specialty comments available.  Imaging: No results found.   PMFS History: Patient Active Problem List   Diagnosis Date Noted  . Other spondylosis with radiculopathy, cervical region 10/28/2020  . Carpal tunnel  syndrome, left upper limb 10/28/2020  . Change in bowel habits 07/27/2020  . Chronic abdominal pain 07/27/2020  . Rectal bleeding 07/27/2020  . Aortic atherosclerosis ([place])   . COPD (chronic obstructive pulmonary disease) ([place]) 02/20/2019  . Chronic constipation 07/31/2018  . LUQ abdominal pain 07/31/2018  . Alcohol use disorder, mild, in early remission 07/31/2018  . Internal hemorrhoids 07/31/2018  . MRSA (methicillin resistant staph aureus) culture positive 06/15/2017  . Acute sinusitis 12/13/2013  . Acute bronchitis 12/13/2013  . Laceration of finger 10/06/2013  . PTSD (post-traumatic stress disorder)    Past Medical History:  Diagnosis Date  . Anxiety    BILATERAL HANDS-- LEFT >RIGHT AND LEGS  . Aortic atherosclerosis ([place])   . Arthritis   . Asthma    NO INHALER  . Bed bug bite   . Blood transfusion without reported diagnosis   . CA - skin cancer   . Chronic pain due to injury RIGHT HAND/WRIST IN 2007  . Complication of anesthesia    unable to urinate after surgery  . COPD (chronic obstructive pulmonary disease) ([facility])    Stage 1  . Crush injury of hand RIGHT IN 2007   X 6 SURG'S  LAST ONE BEING TOTAL WRIST FUSION W/ BONE GRAFT AND PLATE  . Cubital tunnel syndrome    Left elbow  . Depression   . ETOH abuse   . GERD (gastroesophageal reflux disease)    05/11/17- not recently  . Hemorrhoids   . History of kidney stones    passed  . MRSA (methicillin resistant Staphylococcus aureus) carrier   . Neuromuscular disorder (H[facility])    some fibromyalgia  . Neuropathy   . Osteopenia 07/2019   T score -1.9 FRAX 8.2% / 1.2%  . PTSD (post-traumatic stress disorder) 2007 RIGHT HAND CRUSH INJURY W/ 2 FINGER AMPUTATION  . Stroke ([place]) X2 CVA'S  IN 1990   LOSS OF VISION RIGHT Peripherial bilteral  . Substance abuse ([place])    hx: narcotic use  . UC (ulcerative colitis) ([place])   . Weakness of right leg CAUSED BY RETRIEVAL THIGH MUSCLE FOR FLAP RIGHT HAND INJURY-- 2006   RIGHT LEG  GIVES OUT OCCASIONALLY    Family History  Problem Relation Age of Onset  . Lupus Mother        died at 21  . Breast cancer Maternal Aunt 35  . Breast cancer Maternal Grandmother 60  . Stomach cancer Paternal Grandfather   . Colon cancer Neg Hx   . Rectal cancer Neg Hx   . Esophageal cancer Neg Hx   . Inflammatory bowel disease Neg Hx   . Liver disease Neg Hx   . Pancreatic cancer Neg Hx     Past Surgical History:  Procedure Laterality Date  . AMPUTATION Left 05/13/2017   Procedure: Left small finger revision amputation ;  Surgeon: Roseanne Kaufman, MD;  Location: [facility];  Service: Orthopedics;  Laterality: Left;  60 mins  . COLON SURGERY     15 years ago- fistula on colon removed  . COSMETIC SURGERY     right leg and abdomen  . DILATATION & CURETTAGE/HYSTEROSCOPY WITH MYOSURE N/A 06/14/2016   Procedure: [place];  Surgeon: Anastasio Auerbach, MD;  Location: [facility];  Service: Gynecology;  Laterality: N/A;  . HAND CARPECTOMY  12-24-2005   RIGHT-- INCLUDING  REMOVAL  WRIST WIRES AND I &D (EXICIOSNAL)  FOR OSTEOMYELITIS  . HYSTEROSCOPY  12.6.2012   w/removal of polyp  . HYSTEROSCOPY WITH D & C  08/18/2011   Procedure: DILATATION AND CURETTAGE (D&C) /HYSTEROSCOPY;  Surgeon: Anastasio Auerbach, MD;  Location: [facility];  Service: Gynecology;  Laterality: N/A;  . INCISION / DRAINAGE HAND / FINGER  X3  (2006 & 2007)   RIGHT CRUSH WOUND  . LESION REMOVAL  08/18/2011   Procedure: EXCISION VAGINAL LESION;  Surgeon: Anastasio Auerbach, MD;  Location: [facility];  Service: Gynecology;  Laterality: N/A;  . ORIF CARPAL BONE FRACTURE  08-31-05   I & D OF RIGHT HAND CRUSH INJURY/ OPEN FX'S AND INDEX FINGER RAY RESECTION/ COMPLICATED CLOSURE  . ORIF RADIAL FRACTURE  09-04-05   DISTAL RADIAL PLATE AND PARTIAL THUMB AMPUTATION  . RIGHT TOTAL WRIST FUSION W/ BONE GRAFT AND PLATE  [id number]   AND FLAP USING RIGHT GOIN MUSCLE  .  skin cancer removal  2007   throat area  . TUBAL LIGATION  15 YRS AGO   Social History   Occupational History  . Occupation: Housekeeper   Tobacco Use  . Smoking status: Former Smoker    Packs/day: 1.50    Years: 28.00    Pack years: 42.00    Types: Cigarettes    Quit date: 11/29/2001    Years since quitting: 19.0  . Smokeless tobacco: Never Used  Vaping Use  . Vaping Use: Never used  Substance and Sexual Activity  . Alcohol use: Yes    Comment: SOCIAL   . Drug use: Yes    Types: Marijuana  . Sexual activity: Yes    Partners: Male    Birth control/protection: Post-menopausal    Comment: 1st intercourse 60 yo-More than 5 partners

## 2020-12-01 ENCOUNTER — Other Ambulatory Visit: Payer: Self-pay

## 2020-12-01 ENCOUNTER — Encounter (HOSPITAL_BASED_OUTPATIENT_CLINIC_OR_DEPARTMENT_OTHER): Payer: Self-pay | Admitting: Orthopaedic Surgery

## 2020-12-02 ENCOUNTER — Other Ambulatory Visit (HOSPITAL_COMMUNITY): Payer: Medicare Other

## 2020-12-03 ENCOUNTER — Other Ambulatory Visit (HOSPITAL_COMMUNITY)
Admission: RE | Admit: 2020-12-03 | Discharge: 2020-12-03 | Disposition: A | Discharge status: Home / Self Care | Payer: Medicare Other | Source: Ambulatory Visit | Attending: Orthopaedic Surgery | Admitting: Orthopaedic Surgery

## 2020-12-03 DIAGNOSIS — Z20822 Contact with and (suspected) exposure to covid-19: Secondary | ICD-10-CM | POA: Insufficient documentation

## 2020-12-03 DIAGNOSIS — Z01812 Encounter for preprocedural laboratory examination: Secondary | ICD-10-CM | POA: Insufficient documentation

## 2020-12-03 LAB — SARS CORONAVIRUS 2 (TAT 6-24 HRS): SARS Coronavirus 2: NEGATIVE

## 2020-12-04 ENCOUNTER — Encounter (HOSPITAL_BASED_OUTPATIENT_CLINIC_OR_DEPARTMENT_OTHER)
Admission: RE | Admit: 2020-12-04 | Discharge: 2020-12-04 | Disposition: A | Discharge status: Home / Self Care | Payer: Medicare Other | Source: Ambulatory Visit | Attending: Orthopaedic Surgery | Admitting: Orthopaedic Surgery

## 2020-12-04 ENCOUNTER — Encounter (HOSPITAL_BASED_OUTPATIENT_CLINIC_OR_DEPARTMENT_OTHER): Payer: Self-pay | Admitting: Orthopaedic Surgery

## 2020-12-04 DIAGNOSIS — Z01818 Encounter for other preprocedural examination: Secondary | ICD-10-CM | POA: Insufficient documentation

## 2020-12-04 LAB — CBC
HCT: 40 % (ref 36.0–46.0)
Hemoglobin: 13.3 g/dL (ref 12.0–15.0)
MCH: 32.5 pg (ref 26.0–34.0)
MCHC: 33.3 g/dL (ref 30.0–36.0)
MCV: 97.8 fL (ref 80.0–100.0)
Platelets: 318 10*3/uL (ref 150–400)
RBC: 4.09 MIL/uL (ref 3.87–5.11)
RDW: 12 % (ref 11.5–15.5)
WBC: 10.4 10*3/uL (ref 4.0–10.5)
nRBC: 0 % (ref 0.0–0.2)

## 2020-12-04 LAB — BASIC METABOLIC PANEL
Anion gap: 6 (ref 5–15)
BUN: 11 mg/dL (ref 8–23)
CO2: 27 mmol/L (ref 22–32)
Calcium: 9.7 mg/dL (ref 8.9–10.3)
Chloride: 104 mmol/L (ref 98–111)
Creatinine, Ser: 0.74 mg/dL (ref 0.44–1.00)
GFR, Estimated: >60 mL/min (ref >60)
Glucose, Bld: 96 mg/dL (ref 70–99)
Potassium: 4.2 mmol/L (ref 3.5–5.1)
Sodium: 137 mmol/L (ref 135–145)

## 2020-12-04 LAB — SURGICAL PCR SCREEN
MRSA, PCR: NEGATIVE
Staphylococcus aureus: NEGATIVE

## 2020-12-04 NOTE — Progress Notes (Signed)

## 2020-12-04 NOTE — Progress Notes (Signed)
Sent text reminding pt to come in for labwork today.

## 2020-12-07 ENCOUNTER — Ambulatory Visit (HOSPITAL_BASED_OUTPATIENT_CLINIC_OR_DEPARTMENT_OTHER): Payer: Medicare Other | Admitting: Anesthesiology

## 2020-12-07 ENCOUNTER — Other Ambulatory Visit: Payer: Self-pay

## 2020-12-07 ENCOUNTER — Encounter (HOSPITAL_BASED_OUTPATIENT_CLINIC_OR_DEPARTMENT_OTHER): Payer: Self-pay | Admitting: Orthopaedic Surgery

## 2020-12-07 ENCOUNTER — Encounter (HOSPITAL_BASED_OUTPATIENT_CLINIC_OR_DEPARTMENT_OTHER)
Admission: RE | Disposition: A | Discharge status: Home / Self Care | Payer: Self-pay | Source: Home / Self Care | Attending: Orthopaedic Surgery

## 2020-12-07 ENCOUNTER — Ambulatory Visit (HOSPITAL_BASED_OUTPATIENT_CLINIC_OR_DEPARTMENT_OTHER)
Admission: RE | Admit: 2020-12-07 | Discharge: 2020-12-07 | Disposition: A | Discharge status: Home / Self Care | Payer: Medicare Other | Attending: Orthopaedic Surgery | Admitting: Orthopaedic Surgery

## 2020-12-07 DIAGNOSIS — J449 Chronic obstructive pulmonary disease, unspecified: Secondary | ICD-10-CM | POA: Diagnosis not present

## 2020-12-07 DIAGNOSIS — Z87891 Personal history of nicotine dependence: Secondary | ICD-10-CM | POA: Insufficient documentation

## 2020-12-07 DIAGNOSIS — Z8489 Family history of other specified conditions: Secondary | ICD-10-CM | POA: Diagnosis not present

## 2020-12-07 DIAGNOSIS — Z803 Family history of malignant neoplasm of breast: Secondary | ICD-10-CM | POA: Diagnosis not present

## 2020-12-07 DIAGNOSIS — G5602 Carpal tunnel syndrome, left upper limb: Secondary | ICD-10-CM | POA: Diagnosis not present

## 2020-12-07 DIAGNOSIS — Z8673 Personal history of transient ischemic attack (TIA), and cerebral infarction without residual deficits: Secondary | ICD-10-CM | POA: Insufficient documentation

## 2020-12-07 DIAGNOSIS — K648 Other hemorrhoids: Secondary | ICD-10-CM | POA: Diagnosis not present

## 2020-12-07 DIAGNOSIS — J440 Chronic obstructive pulmonary disease with acute lower respiratory infection: Secondary | ICD-10-CM | POA: Diagnosis not present

## 2020-12-07 DIAGNOSIS — Z800 Family history of malignant neoplasm of digestive organs: Secondary | ICD-10-CM | POA: Diagnosis not present

## 2020-12-07 DIAGNOSIS — I700 Atherosclerosis of aorta: Secondary | ICD-10-CM | POA: Diagnosis not present

## 2020-12-07 HISTORY — PX: CARPAL TUNNEL RELEASE: SHX101

## 2020-12-07 SURGERY — CARPAL TUNNEL RELEASE
Anesthesia: Monitor Anesthesia Care | Site: Wrist | Laterality: Left

## 2020-12-07 MED ORDER — FENTANYL CITRATE (PF) 100 MCG/2ML IJ SOLN
INTRAMUSCULAR | Status: AC
  Filled 2020-12-07: qty 2

## 2020-12-07 MED ORDER — LIDOCAINE HCL (PF) 1 % IJ SOLN
INTRAMUSCULAR | Status: DC | PRN
  Administered 2020-12-07: 5 mL

## 2020-12-07 MED ORDER — OXYCODONE HCL 5 MG PO TABS
ORAL_TABLET | ORAL | Status: AC
  Filled 2020-12-07: qty 1

## 2020-12-07 MED ORDER — LACTATED RINGERS IV SOLN: INTRAVENOUS | Status: DC

## 2020-12-07 MED ORDER — MEPERIDINE HCL 25 MG/ML IJ SOLN
6.2500 mg | INTRAMUSCULAR | Status: DC | PRN
Start: 2020-12-07 — End: 2020-12-07

## 2020-12-07 MED ORDER — OXYCODONE HCL 5 MG PO TABS
5.0000 mg | ORAL_TABLET | Freq: Once | ORAL | Status: AC | PRN
  Administered 2020-12-07: 5 mg via ORAL

## 2020-12-07 MED ORDER — PROPOFOL 500 MG/50ML IV EMUL
INTRAVENOUS | Status: DC | PRN
  Administered 2020-12-07: 150 ug/kg/min via INTRAVENOUS

## 2020-12-07 MED ORDER — ONDANSETRON HCL 4 MG/2ML IJ SOLN
INTRAMUSCULAR | Status: DC | PRN
  Administered 2020-12-07: 4 mg via INTRAVENOUS

## 2020-12-07 MED ORDER — FENTANYL CITRATE (PF) 100 MCG/2ML IJ SOLN
INTRAMUSCULAR | Status: DC | PRN
  Administered 2020-12-07: 25 ug via INTRAVENOUS
  Administered 2020-12-07: 50 ug via INTRAVENOUS
  Administered 2020-12-07: 25 ug via INTRAVENOUS

## 2020-12-07 MED ORDER — BUPIVACAINE HCL (PF) 0.25 % IJ SOLN
INTRAMUSCULAR | Status: DC | PRN
  Administered 2020-12-07: 5 mL

## 2020-12-07 MED ORDER — CEFAZOLIN SODIUM-DEXTROSE 2-4 GM/100ML-% IV SOLN
2.0000 g | INTRAVENOUS | Status: AC
  Administered 2020-12-07: 2 g via INTRAVENOUS

## 2020-12-07 MED ORDER — PROPOFOL 500 MG/50ML IV EMUL
INTRAVENOUS | Status: AC
  Filled 2020-12-07: qty 50

## 2020-12-07 MED ORDER — OXYCODONE HCL 5 MG/5ML PO SOLN: 5.0000 mg | Freq: Once | ORAL | Status: AC | PRN

## 2020-12-07 MED ORDER — MIDAZOLAM HCL 2 MG/2ML IJ SOLN
INTRAMUSCULAR | Status: AC
  Filled 2020-12-07: qty 2

## 2020-12-07 MED ORDER — FENTANYL CITRATE (PF) 100 MCG/2ML IJ SOLN
25.0000 ug | INTRAMUSCULAR | Status: DC | PRN
  Administered 2020-12-07 (×2): 25 ug via INTRAVENOUS

## 2020-12-07 MED ORDER — CEFAZOLIN SODIUM-DEXTROSE 2-4 GM/100ML-% IV SOLN
INTRAVENOUS | Status: AC
  Filled 2020-12-07: qty 100

## 2020-12-07 MED ORDER — MIDAZOLAM HCL 5 MG/5ML IJ SOLN
INTRAMUSCULAR | Status: DC | PRN
  Administered 2020-12-07: 2 mg via INTRAVENOUS

## 2020-12-07 MED ORDER — OXYCODONE-ACETAMINOPHEN 5-325 MG PO TABS: 1.0000 | ORAL_TABLET | ORAL | 0 refills | Status: DC | PRN

## 2020-12-07 MED ORDER — PROMETHAZINE HCL 25 MG/ML IJ SOLN
6.2500 mg | INTRAMUSCULAR | Status: DC | PRN
Start: 2020-12-07 — End: 2020-12-07

## 2020-12-07 SURGICAL SUPPLY — 41 items
APL SKNCLS STERI-STRIP NONHPOA (GAUZE/BANDAGES/DRESSINGS)
BENZOIN TINCTURE PRP APPL 2/3 (GAUZE/BANDAGES/DRESSINGS) IMPLANT
BLADE SURG 15 STRL LF DISP TIS (BLADE) ×1 IMPLANT
BLADE SURG 15 STRL SS (BLADE) ×2
BNDG CMPR 9X4 STRL LF SNTH (GAUZE/BANDAGES/DRESSINGS) ×1
BNDG ELASTIC 4X5.8 VLCR STR LF (GAUZE/BANDAGES/DRESSINGS) ×2 IMPLANT
BNDG ESMARK 4X9 LF (GAUZE/BANDAGES/DRESSINGS) ×2 IMPLANT
CORD BIPOLAR FORCEPS 12FT (ELECTRODE) ×2 IMPLANT
COVER BACK TABLE 60X90IN (DRAPES) ×2 IMPLANT
COVER MAYO STAND STRL (DRAPES) ×2 IMPLANT
COVER WAND RF STERILE (DRAPES) IMPLANT
CUFF TOURN SGL QUICK 18X4 (TOURNIQUET CUFF) ×2 IMPLANT
DRAPE EXTREMITY T 121X128X90 (DISPOSABLE) ×2 IMPLANT
DRAPE SURG 17X23 STRL (DRAPES) ×2 IMPLANT
DURAPREP 26ML APPLICATOR (WOUND CARE) ×2 IMPLANT
GAUZE SPONGE 4X4 12PLY STRL (GAUZE/BANDAGES/DRESSINGS) ×2 IMPLANT
GAUZE XEROFORM 1X8 LF (GAUZE/BANDAGES/DRESSINGS) ×2 IMPLANT
GLOVE SRG 8 PF TXTR STRL LF DI (GLOVE) ×1 IMPLANT
GLOVE SURG ENC MOIS LTX SZ7.5 (GLOVE) ×2 IMPLANT
GLOVE SURG POLYISO LF SZ8 (GLOVE) ×2 IMPLANT
GLOVE SURG UNDER POLY LF SZ8 (GLOVE) ×2
GOWN STRL REUS W/ TWL LRG LVL3 (GOWN DISPOSABLE) IMPLANT
GOWN STRL REUS W/ TWL XL LVL3 (GOWN DISPOSABLE) ×1 IMPLANT
GOWN STRL REUS W/TWL 2XL LVL3 (GOWN DISPOSABLE) ×2 IMPLANT
GOWN STRL REUS W/TWL LRG LVL3 (GOWN DISPOSABLE)
GOWN STRL REUS W/TWL XL LVL3 (GOWN DISPOSABLE) ×2
LOOP VESSEL MAXI BLUE (MISCELLANEOUS) IMPLANT
NEEDLE HYPO 25X1 1.5 SAFETY (NEEDLE) ×2 IMPLANT
NS IRRIG 1000ML POUR BTL (IV SOLUTION) ×2 IMPLANT
PACK BASIN DAY SURGERY FS (CUSTOM PROCEDURE TRAY) ×2 IMPLANT
PAD CAST 3X4 CTTN HI CHSV (CAST SUPPLIES) ×1 IMPLANT
PADDING CAST ABS 4INX4YD NS (CAST SUPPLIES) ×1
PADDING CAST ABS COTTON 4X4 ST (CAST SUPPLIES) ×1 IMPLANT
PADDING CAST COTTON 3X4 STRL (CAST SUPPLIES) ×2
STOCKINETTE 4X48 STRL (DRAPES) ×2 IMPLANT
STRIP CLOSURE SKIN 1/2X4 (GAUZE/BANDAGES/DRESSINGS) IMPLANT
SUT ETHILON 4 0 PS 2 18 (SUTURE) ×2 IMPLANT
SYR BULB EAR ULCER 3OZ GRN STR (SYRINGE) ×2 IMPLANT
SYR CONTROL 10ML LL (SYRINGE) ×2 IMPLANT
TOWEL GREEN STERILE FF (TOWEL DISPOSABLE) ×2 IMPLANT
UNDERPAD 30X36 HEAVY ABSORB (UNDERPADS AND DIAPERS) ×2 IMPLANT

## 2020-12-07 NOTE — Transfer of Care (Signed)
Immediate Anesthesia Transfer of Care Note  Patient: [name]  Procedure(s) Performed: LEFT CARPAL TUNNEL RELEASE (Left Wrist)  Patient Location: PACU  Anesthesia Type:MAC  Level of Consciousness: sedated  Airway & Oxygen Therapy: Patient Spontanous Breathing and Patient connected to face mask oxygen  Post-op Assessment: Report given to RN and Post -op Vital signs reviewed and stable  Post vital signs: Reviewed and stable  Last Vitals:  Vitals Value Taken Time  BP 115/71 12/07/20 1230  Temp    Pulse 49 12/07/20 1231  Resp 14 12/07/20 1231  SpO2 100 % 12/07/20 1231  Vitals shown include unvalidated device data.  Last Pain:  Vitals:   12/07/20 1005  TempSrc: Oral  PainSc: 0-No pain         Complications: No complications documented.

## 2020-12-07 NOTE — Interval H&P Note (Signed)
History and Physical Interval Note:  12/07/2020 11:34 AM  [name]  has presented today for surgery, with the diagnosis of LEFT CARPAL TUNNEL SYNDROME.  The various methods of treatment have been discussed with the patient and family. After consideration of risks, benefits and other options for treatment, the patient has consented to  Procedure(s): LEFT CARPAL TUNNEL RELEASE (Left) as a surgical intervention.  The patient's history has been reviewed, patient examined, no change in status, stable for surgery.  I have reviewed the patient's chart and labs.  Questions were answered to the patient's satisfaction.     [name]

## 2020-12-07 NOTE — H&P (Signed)
Patient: [name]                                        Date of Birth: 1961/06/02                                                    MRN: [id number] Visit Date: 11/25/2020                                                                     Requested by: Susy Frizzle, MD [address] [place],  [place] [address] PCP: Susy Frizzle, MD   Assessment & Plan: Visit Diagnoses:  1. Carpal tunnel syndrome, left upper limb     Plan: Patient has moderately severe left carpal tunnel failed splinting anti-inflammatories rest activities.  Since she has had traumatic amputation of the opposite thumb and index fingers her left hand is her dominant hand and she does most things left-handed with right hand is a helper hand.  Patient had moderate to severe carpal tunnel on electrical test and plan would be carpal tunnel release.  She understands that the plain radiographs shows cervical spondylosis at C5-6 but her physical exam suggest this is primarily coming from the carpal tunnel and not cervical root compression at C5-6 level.  We will proceed with carpal tunnel release as an outpatient procedure.  We discussed elevation of the hand afterwards.  Patient understands operative procedure discussed.  Risk surgery was discussed in detail she understands request to proceed.  Decision for surgery made today.  Follow-Up Instructions: No follow-ups on file.   Orders:  No orders of the defined types were placed in this encounter.  No orders of the defined types were placed in this encounter.     Procedures: No procedures performed   Clinical Data: No additional findings.   Subjective:    Chief Complaint  Patient presents with  . Neck - Follow-up  . Left Wrist - Follow-up    HPI 60 year old female returns persistent problems with left hand numbness.  She tried wearing the splint but states she has to take it off it tends to make some more symptoms worse.  She has  been placed on some gabapentin by her PCP and she feels like this is giving her slight improvement.  Electrical test showed moderate to severe carpal tunnel.  Plain radiographs did show spondylosis C5-6 on today's visit we reviewed all previous studies and images and discussed treatment options.  Patient states she is ready to proceed with  Left carpal tunnel release.  Review of Systems 14 point system update.  Cervical spondylosis C5-6 history of MSSA.  Traumatic absence of opposite right thumb with flap multiple surgeries. All other systems noncontributory to HPI.  Objective: Vital Signs: Ht 5' 1"  (1.549 m)   Wt 133 lb (60.3 kg)   BMI 25.13 kg/m   Physical Exam Constitutional:      Appearance: She is well-developed.  HENT:     Head:  Normocephalic.     Right Ear: External ear normal.     Left Ear: External ear normal.  Eyes:     Pupils: Pupils are equal, round, and reactive to light.  Neck:     Thyroid: No thyromegaly.     Trachea: No tracheal deviation.  Cardiovascular:     Rate and Rhythm: Normal rate.  Pulmonary:     Effort: Pulmonary effort is normal.  Abdominal:     Palpations: Abdomen is soft.  Skin:    General: Skin is warm and dry.  Neurological:     Mental Status: She is alert and oriented to person, place, and time.  Psychiatric:        Behavior: Behavior normal.     Ortho Exam traumatic Thumb and Index.  She Is Free Flap.  Good Flexion-Extension of Right Long Ring and Small Finger.  Mild Thenar Atrophy on the Left Positive Carpal Compression Test Positive Phalen's on the Left.  Decreased Two-Point Radial 3 and Half Fingers Normal on the Small Finger.  Ulnar Nerve at the Elbow Is Normal.  No Brachial Plexus Tenderness.  Mild Discomfort with Spurling Test on the Left Negative on the Right.  No Increased Pain with Cervical Compression.  Specialty Comments:  No specialty comments available.  Imaging: No results found.   PMFS History:     Patient  Active Problem List   Diagnosis Date Noted  . Other spondylosis with radiculopathy, cervical region 10/28/2020  . Carpal tunnel syndrome, left upper limb 10/28/2020  . Change in bowel habits 07/27/2020  . Chronic abdominal pain 07/27/2020  . Rectal bleeding 07/27/2020  . Aortic atherosclerosis ([place])   . COPD (chronic obstructive pulmonary disease) ([place]) 02/20/2019  . Chronic constipation 07/31/2018  . LUQ abdominal pain 07/31/2018  . Alcohol use disorder, mild, in early remission 07/31/2018  . Internal hemorrhoids 07/31/2018  . MRSA (methicillin resistant staph aureus) culture positive 06/15/2017  . Acute sinusitis 12/13/2013  . Acute bronchitis 12/13/2013  . Laceration of finger 10/06/2013  . PTSD (post-traumatic stress disorder)        Past Medical History:  Diagnosis Date  . Anxiety    BILATERAL HANDS-- LEFT >RIGHT AND LEGS  . Aortic atherosclerosis ([place])   . Arthritis   . Asthma    NO INHALER  . Bed bug bite   . Blood transfusion without reported diagnosis   . CA - skin cancer   . Chronic pain due to injury RIGHT HAND/WRIST IN 2007  . Complication of anesthesia    unable to urinate after surgery  . COPD (chronic obstructive pulmonary disease) (HCC)    Stage 1  . Crush injury of hand RIGHT IN 2007   X 6 SURG'S  LAST ONE BEING TOTAL WRIST FUSION W/ BONE GRAFT AND PLATE  . Cubital tunnel syndrome    Left elbow  . Depression   . ETOH abuse   . GERD (gastroesophageal reflux disease)    05/11/17- not recently  . Hemorrhoids   . History of kidney stones    passed  . MRSA (methicillin resistant Staphylococcus aureus) carrier   . Neuromuscular disorder (HCC)    some fibromyalgia  . Neuropathy   . Osteopenia 07/2019   T score -1.9 FRAX 8.2% / 1.2%  . PTSD (post-traumatic stress disorder) 2007 RIGHT HAND CRUSH INJURY W/ 2 FINGER AMPUTATION  . Stroke ([place]) X2 CVA'S  IN 1990   LOSS OF VISION RIGHT Peripherial bilteral  . Substance abuse  ([place])  hx: narcotic use  . UC (ulcerative colitis) ([place])   . Weakness of right leg CAUSED BY RETRIEVAL THIGH MUSCLE FOR FLAP RIGHT HAND INJURY-- 2006   RIGHT LEG GIVES OUT OCCASIONALLY         Family History  Problem Relation Age of Onset  . Lupus Mother        died at 78  . Breast cancer Maternal Aunt 35  . Breast cancer Maternal Grandmother 60  . Stomach cancer Paternal Grandfather   . Colon cancer Neg Hx   . Rectal cancer Neg Hx   . Esophageal cancer Neg Hx   . Inflammatory bowel disease Neg Hx   . Liver disease Neg Hx   . Pancreatic cancer Neg Hx          Past Surgical History:  Procedure Laterality Date  . AMPUTATION Left 05/13/2017   Procedure: Left small finger revision amputation ;  Surgeon: Roseanne Kaufman, MD;  Location: [facility];  Service: Orthopedics;  Laterality: Left;  60 mins  . COLON SURGERY     15 years ago- fistula on colon removed  . COSMETIC SURGERY     right leg and abdomen  . DILATATION & CURETTAGE/HYSTEROSCOPY WITH MYOSURE N/A 06/14/2016   Procedure: [place];  Surgeon: Anastasio Auerbach, MD;  Location: [facility];  Service: Gynecology;  Laterality: N/A;  . HAND CARPECTOMY  12-24-2005   RIGHT-- INCLUDING  REMOVAL  WRIST WIRES AND I &D (EXICIOSNAL)  FOR OSTEOMYELITIS  . HYSTEROSCOPY  12.6.2012   w/removal of polyp  . HYSTEROSCOPY WITH D & C  08/18/2011   Procedure: DILATATION AND CURETTAGE (D&C) /HYSTEROSCOPY;  Surgeon: Anastasio Auerbach, MD;  Location: [facility];  Service: Gynecology;  Laterality: N/A;  . INCISION / DRAINAGE HAND / FINGER  X3  (2006 & 2007)   RIGHT CRUSH WOUND  . LESION REMOVAL  08/18/2011   Procedure: EXCISION VAGINAL LESION;  Surgeon: Anastasio Auerbach, MD;  Location: [facility];  Service: Gynecology;  Laterality: N/A;  . ORIF CARPAL BONE FRACTURE  08-31-05   I & D OF RIGHT HAND CRUSH INJURY/ OPEN FX'S AND INDEX FINGER RAY RESECTION/  COMPLICATED CLOSURE  . ORIF RADIAL FRACTURE  09-04-05   DISTAL RADIAL PLATE AND PARTIAL THUMB AMPUTATION  . RIGHT TOTAL WRIST FUSION W/ BONE GRAFT AND PLATE  [id number]   AND FLAP USING RIGHT GOIN MUSCLE  . skin cancer removal  2007   throat area  . TUBAL LIGATION  15 YRS AGO   Social History        Occupational History  . Occupation: Housekeeper   Tobacco Use  . Smoking status: Former Smoker    Packs/day: 1.50    Years: 28.00    Pack years: 42.00    Types: Cigarettes    Quit date: 11/29/2001    Years since quitting: 19.0  . Smokeless tobacco: Never Used  Vaping Use  . Vaping Use: Never used  Substance and Sexual Activity  . Alcohol use: Yes    Comment: SOCIAL   . Drug use: Yes    Types: Marijuana  . Sexual activity: Yes    Partners: Male    Birth control/protection: Post-menopausal    Comment: 1st intercourse 60 yo-More than 5 partners

## 2020-12-07 NOTE — Anesthesia Postprocedure Evaluation (Signed)
Anesthesia Post Note  Patient: [name]  Procedure(s) Performed: LEFT CARPAL TUNNEL RELEASE (Left Wrist)     Patient location during evaluation: PACU Anesthesia Type: MAC Level of consciousness: awake and alert Pain management: pain level controlled Vital Signs Assessment: post-procedure vital signs reviewed and stable Respiratory status: spontaneous breathing Cardiovascular status: stable Anesthetic complications: no   No complications documented.  Last Vitals:  Vitals:   12/07/20 1245 12/07/20 1254  BP: 139/84   Pulse: (!) 48 (!) 49  Resp: 12 13  Temp:    SpO2: 99% 97%    Last Pain:  Vitals:   12/07/20 1254  TempSrc:   PainSc: [place]

## 2020-12-07 NOTE — Op Note (Signed)
Preop diagnosis: Left carpal tunnel syndrome  Postop diagnosis: Same  Procedure: Left carpal tunnel release  Surgeon: Lorin Mercy MD  Anesthesia MAC +5 cc Marcaine local  Tourniquet time: Less than 20 minutes.  X250.  Procedure: After prepping and draping with forearm tourniquet DuraPrep extremity sheets and drapes were used preoperative Ancef prophylaxis.  Sterile skin marker was used for planned incision using the radial aspect of the ring finger and ulnar side of the thumb lines for the end of the carpal canal.  Hand is elevated tourniquet inflated.  Incision was made and median nerve is identified after dividing palmar fascia and starting the transverse carpal ligament release.  Ulnar side of the nerve was followed.  There was a distal radial takeoff of the thenar motor branch.  There is chronic hourglass deformity of the nerve underneath the transverse carpal ligament and flexor tenosynovitis was present without masses in the carpal canal.  Once complete release was performed the fingertip could be placed proximally and distally with no areas compression palmar fat was visualized.  Tourniquet was deflated.  There is hyperemia of the nerve where it been chronically compressed.  Irrigation bipolar cautery used for remaining veins were not coagulated on the way in.  Irrigation with saline solution.  3-0 nylon interrupted skin closure.  Xeroform 20 nurse 4 x 4's web roll and Ace wrap was applied for soft dressing patient tolerated procedure well office follow-up 1 week.

## 2020-12-07 NOTE — Discharge Instructions (Signed)
Keep dressing on until you return to see Dr. Lorin Mercy in 1 week.  Hand elevation above your heart level will decrease pain after the surgery.  Some numbing medicine was placed in the incision and as it wears off in 4 to 6 hours you may notice some increased pain at the incision site.  Pain medication has been sent into your pharmacy.  Minimal use of your hand will also decrease postop pain.  You could use ibuprofen and Aleve in addition as long as you do not have stomach problems or cardiac problems that prohibit use of anti-inflammatories.     Post Anesthesia Home Care Instructions  Activity: Get plenty of rest for the remainder of the day. A responsible individual must stay with you for 24 hours following the procedure.  For the next 24 hours, DO NOT: -Drive a car -Paediatric nurse -Drink alcoholic beverages -Take any medication unless instructed by your physician -Make any legal decisions or sign important papers.  Meals: Start with liquid foods such as gelatin or soup. Progress to regular foods as tolerated. Avoid greasy, spicy, heavy foods. If nausea and/or vomiting occur, drink only clear liquids until the nausea and/or vomiting subsides. Call your physician if vomiting continues.  Special Instructions/Symptoms: Your throat may feel dry or sore from the anesthesia or the breathing tube placed in your throat during surgery. If this causes discomfort, gargle with warm salt water. The discomfort should disappear within 24 hours.  If you had a scopolamine patch placed behind your ear for the management of post- operative nausea and/or vomiting:  1. The medication in the patch is effective for 72 hours, after which it should be removed.  Wrap patch in a tissue and discard in the trash. Wash hands thoroughly with soap and water. 2. You may remove the patch earlier than 72 hours if you experience unpleasant side effects which may include dry mouth, dizziness or visual disturbances. 3. Avoid  touching the patch. Wash your hands with soap and water after contact with the patch.

## 2020-12-07 NOTE — Anesthesia Preprocedure Evaluation (Addendum)
Anesthesia Evaluation  Patient identified by MRN, date of birth, ID band Patient awake    Reviewed: Allergy & Precautions, NPO status , Patient's Chart, lab work & pertinent test results  History of Anesthesia Complications (+) history of anesthetic complications  Airway Mallampati: II  TM Distance: >3 FB Neck ROM: Full    Dental no notable dental hx. (+) Dental Advisory Given   Pulmonary asthma , COPD, Patient abstained from smoking., former smoker,    Pulmonary exam normal breath sounds clear to auscultation       Cardiovascular negative cardio ROS Normal cardiovascular exam Rhythm:Regular Rate:Normal     Neuro/Psych PSYCHIATRIC DISORDERS Anxiety Depression  Neuromuscular disease CVA    GI/Hepatic Neg liver ROS, PUD, GERD  ,  Endo/Other  negative endocrine ROS  Renal/GU negative Renal ROS     Musculoskeletal  (+) Arthritis ,   Abdominal   Peds  Hematology negative hematology ROS (+)   Anesthesia Other Findings   Reproductive/Obstetrics                            Anesthesia Physical Anesthesia Plan  ASA: II  Anesthesia Plan: MAC   Post-op Pain Management:    Induction: Intravenous  PONV Risk Score and Plan: 2 and Propofol infusion, Treatment may vary due to age or medical condition, Midazolam and Ondansetron  Airway Management Planned: Natural Airway  Additional Equipment: None  Intra-op Plan:   Post-operative Plan:   Informed Consent: I have reviewed the patients History and Physical, chart, labs and discussed the procedure including the risks, benefits and alternatives for the proposed anesthesia with the patient or authorized representative who has indicated his/her understanding and acceptance.     Dental advisory given  Plan Discussed with: CRNA  Anesthesia Plan Comments:        Anesthesia Quick Evaluation

## 2020-12-08 ENCOUNTER — Encounter (HOSPITAL_BASED_OUTPATIENT_CLINIC_OR_DEPARTMENT_OTHER): Payer: Self-pay | Admitting: Orthopaedic Surgery

## 2020-12-16 ENCOUNTER — Other Ambulatory Visit: Payer: Self-pay

## 2020-12-16 ENCOUNTER — Ambulatory Visit (INDEPENDENT_AMBULATORY_CARE_PROVIDER_SITE_OTHER): Payer: Medicare Other | Admitting: Orthopaedic Surgery

## 2020-12-16 ENCOUNTER — Encounter: Payer: Self-pay | Admitting: Orthopaedic Surgery

## 2020-12-16 VITALS — Ht 61.0 in | Wt 133.0 lb

## 2020-12-16 DIAGNOSIS — G5602 Carpal tunnel syndrome, left upper limb: Secondary | ICD-10-CM

## 2020-12-16 NOTE — Progress Notes (Signed)
Post-Op Visit Note   Patient: [name]           Date of Birth: Mar 20, 1961           MRN: [id number] Visit Date: 12/16/2020 PCP: Susy Frizzle, MD   Assessment & Plan: Post left carpal tunnel release.  Sutures are harvested.  She has responded home.  Recheck 3 weeks.  Chief Complaint:  Chief Complaint  Patient presents with  . Left Hand - Routine Post Op    12/07/2020 left CTR   Visit Diagnoses:  1. Carpal tunnel syndrome, left upper limb     Plan: Sutures removed improvement in hand pain and numbness.  [place] given she will use her wrist splint recheck 3 weeks.  Follow-Up Instructions: Return in about 3 weeks (around 01/06/2021).   Orders:  No orders of the defined types were placed in this encounter.  No orders of the defined types were placed in this encounter.   Imaging: No results found.  PMFS History: Patient Active Problem List   Diagnosis Date Noted  . Other spondylosis with radiculopathy, cervical region 10/28/2020  . Carpal tunnel syndrome, left upper limb 10/28/2020  . Change in bowel habits 07/27/2020  . Chronic abdominal pain 07/27/2020  . Rectal bleeding 07/27/2020  . Aortic atherosclerosis ([place])   . COPD (chronic obstructive pulmonary disease) ([place]) 02/20/2019  . Chronic constipation 07/31/2018  . LUQ abdominal pain 07/31/2018  . Alcohol use disorder, mild, in early remission 07/31/2018  . Internal hemorrhoids 07/31/2018  . MRSA (methicillin resistant staph aureus) culture positive 06/15/2017  . Acute sinusitis 12/13/2013  . Acute bronchitis 12/13/2013  . Laceration of finger 10/06/2013  . PTSD (post-traumatic stress disorder)    Past Medical History:  Diagnosis Date  . Anxiety    BILATERAL HANDS-- LEFT >RIGHT AND LEGS  . Aortic atherosclerosis ([place])   . Arthritis   . Asthma    NO INHALER  . Bed bug bite   . Blood transfusion without reported diagnosis   . CA - skin cancer   . Chronic pain due to injury RIGHT HAND/WRIST  IN 2007  . Complication of anesthesia    unable to urinate after surgery  . COPD (chronic obstructive pulmonary disease) (HCC)    Stage 1  . Crush injury of hand RIGHT IN 2007   X 6 SURG'S  LAST ONE BEING TOTAL WRIST FUSION W/ BONE GRAFT AND PLATE  . Cubital tunnel syndrome    Left elbow  . Depression   . ETOH abuse   . GERD (gastroesophageal reflux disease)    05/11/17- not recently  . Hemorrhoids   . History of kidney stones    passed  . MRSA (methicillin resistant Staphylococcus aureus) carrier   . Neuromuscular disorder (HCC)    some fibromyalgia  . Neuropathy   . Osteopenia 07/2019   T score -1.9 FRAX 8.2% / 1.2%  . PTSD (post-traumatic stress disorder) 2007 RIGHT HAND CRUSH INJURY W/ 2 FINGER AMPUTATION  . Stroke ([place]) X2 CVA'S  IN 1990   LOSS OF VISION RIGHT Peripherial bilteral  . Substance abuse ([place])    hx: narcotic use  . UC (ulcerative colitis) ([place])   . Weakness of right leg CAUSED BY RETRIEVAL THIGH MUSCLE FOR FLAP RIGHT HAND INJURY-- 2006   RIGHT LEG GIVES OUT OCCASIONALLY    Family History  Problem Relation Age of Onset  . Lupus Mother        died at 25  . Breast cancer  Maternal Aunt 35  . Breast cancer Maternal Grandmother 60  . Stomach cancer Paternal Grandfather   . Colon cancer Neg Hx   . Rectal cancer Neg Hx   . Esophageal cancer Neg Hx   . Inflammatory bowel disease Neg Hx   . Liver disease Neg Hx   . Pancreatic cancer Neg Hx     Past Surgical History:  Procedure Laterality Date  . AMPUTATION Left 05/13/2017   Procedure: Left small finger revision amputation ;  Surgeon: Roseanne Kaufman, MD;  Location: [facility];  Service: Orthopedics;  Laterality: Left;  60 mins  . CARPAL TUNNEL RELEASE Left 12/07/2020   Procedure: LEFT CARPAL TUNNEL RELEASE;  Surgeon: [name], MD;  Location: [facility];  Service: Orthopedics;  Laterality: Left;  . COLON SURGERY     15 years ago- fistula on colon removed  . COSMETIC SURGERY     right leg and  abdomen  . DILATATION & CURETTAGE/HYSTEROSCOPY WITH MYOSURE N/A 06/14/2016   Procedure: [place];  Surgeon: Anastasio Auerbach, MD;  Location: [facility];  Service: Gynecology;  Laterality: N/A;  . HAND CARPECTOMY  12-24-2005   RIGHT-- INCLUDING  REMOVAL  WRIST WIRES AND I &D (EXICIOSNAL)  FOR OSTEOMYELITIS  . HYSTEROSCOPY  12.6.2012   w/removal of polyp  . HYSTEROSCOPY WITH D & C  08/18/2011   Procedure: DILATATION AND CURETTAGE (D&C) /HYSTEROSCOPY;  Surgeon: Anastasio Auerbach, MD;  Location: [facility];  Service: Gynecology;  Laterality: N/A;  . INCISION / DRAINAGE HAND / FINGER  X3  (2006 & 2007)   RIGHT CRUSH WOUND  . LESION REMOVAL  08/18/2011   Procedure: EXCISION VAGINAL LESION;  Surgeon: Anastasio Auerbach, MD;  Location: [facility];  Service: Gynecology;  Laterality: N/A;  . ORIF CARPAL BONE FRACTURE  08-31-05   I & D OF RIGHT HAND CRUSH INJURY/ OPEN FX'S AND INDEX FINGER RAY RESECTION/ COMPLICATED CLOSURE  . ORIF RADIAL FRACTURE  09-04-05   DISTAL RADIAL PLATE AND PARTIAL THUMB AMPUTATION  . RIGHT TOTAL WRIST FUSION W/ BONE GRAFT AND PLATE  09-81-1914   AND FLAP USING RIGHT GOIN MUSCLE  . skin cancer removal  2007   throat area  . TUBAL LIGATION  15 YRS AGO   Social History   Occupational History  . Occupation: Housekeeper   Tobacco Use  . Smoking status: Former Smoker    Packs/day: 1.50    Years: 28.00    Pack years: 42.00    Types: Cigarettes    Quit date: 11/29/2001    Years since quitting: 19.0  . Smokeless tobacco: Never Used  Vaping Use  . Vaping Use: Never used  Substance and Sexual Activity  . Alcohol use: Yes    Comment: wine daily  . Drug use: Yes    Types: Marijuana    Comment: last smoked 12-08-20  . Sexual activity: Yes    Partners: Male    Birth control/protection: Post-menopausal    Comment: 1st intercourse 60 yo-More than 5 partners

## 2021-01-06 ENCOUNTER — Encounter: Payer: Self-pay | Admitting: Orthopaedic Surgery

## 2021-01-06 ENCOUNTER — Ambulatory Visit (INDEPENDENT_AMBULATORY_CARE_PROVIDER_SITE_OTHER): Payer: Medicare Other | Admitting: Orthopaedic Surgery

## 2021-01-06 VITALS — BP 156/74 | HR 51

## 2021-01-06 DIAGNOSIS — G5602 Carpal tunnel syndrome, left upper limb: Secondary | ICD-10-CM

## 2021-01-06 NOTE — Progress Notes (Signed)
Post-Op Visit Note   Patient: [name]           Date of Birth: 04-Sep-1961           MRN: [id number] Visit Date: 01/06/2021 PCP: Susy Frizzle, MD   Assessment & Plan: 3 weeks post left carpal tunnel release.  Opposite right hand posttraumatic multiple surgeries, absence free flap as previously described.  She noticed improvement in her left hand pain and improvement in the numbness since the surgery.  Incision looks good she is applying lotion.  She can return if she has persistent symptoms.  She had problems with metatarsalgia and pad placed has given her improvement in her foot symptoms.  She will return if she needs to be seen for this.  Chief Complaint:  Chief Complaint  Patient presents with  . Left Wrist - Routine Post Op   Visit Diagnoses:  1. Carpal tunnel syndrome, left upper limb           Post op  Plan: return PRN Follow-Up Instructions: Return if symptoms worsen or fail to improve.   Orders:  No orders of the defined types were placed in this encounter.  No orders of the defined types were placed in this encounter.   Imaging: No results found.  PMFS History: Patient Active Problem List   Diagnosis Date Noted  . Other spondylosis with radiculopathy, cervical region 10/28/2020  . Carpal tunnel syndrome, left upper limb 10/28/2020  . Change in bowel habits 07/27/2020  . Chronic abdominal pain 07/27/2020  . Rectal bleeding 07/27/2020  . Aortic atherosclerosis ([place])   . COPD (chronic obstructive pulmonary disease) ([place]) 02/20/2019  . Chronic constipation 07/31/2018  . LUQ abdominal pain 07/31/2018  . Alcohol use disorder, mild, in early remission 07/31/2018  . Internal hemorrhoids 07/31/2018  . MRSA (methicillin resistant staph aureus) culture positive 06/15/2017  . Acute sinusitis 12/13/2013  . Acute bronchitis 12/13/2013  . Laceration of finger 10/06/2013  . PTSD (post-traumatic stress disorder)    Past Medical History:  Diagnosis Date  .  Anxiety    BILATERAL HANDS-- LEFT >RIGHT AND LEGS  . Aortic atherosclerosis ([place])   . Arthritis   . Asthma    NO INHALER  . Bed bug bite   . Blood transfusion without reported diagnosis   . CA - skin cancer   . Chronic pain due to injury RIGHT HAND/WRIST IN 2007  . Complication of anesthesia    unable to urinate after surgery  . COPD (chronic obstructive pulmonary disease) (HCC)    Stage 1  . Crush injury of hand RIGHT IN 2007   X 6 SURG'S  LAST ONE BEING TOTAL WRIST FUSION W/ BONE GRAFT AND PLATE  . Cubital tunnel syndrome    Left elbow  . Depression   . ETOH abuse   . GERD (gastroesophageal reflux disease)    05/11/17- not recently  . Hemorrhoids   . History of kidney stones    passed  . MRSA (methicillin resistant Staphylococcus aureus) carrier   . Neuromuscular disorder (HCC)    some fibromyalgia  . Neuropathy   . Osteopenia 07/2019   T score -1.9 FRAX 8.2% / 1.2%  . PTSD (post-traumatic stress disorder) 2007 RIGHT HAND CRUSH INJURY W/ 2 FINGER AMPUTATION  . Stroke ([place]) X2 CVA'S  IN 1990   LOSS OF VISION RIGHT Peripherial bilteral  . Substance abuse ([place])    hx: narcotic use  . UC (ulcerative colitis) ([place])   . Weakness  of right leg CAUSED BY RETRIEVAL THIGH MUSCLE FOR FLAP RIGHT HAND INJURY-- 2006   RIGHT LEG GIVES OUT OCCASIONALLY    Family History  Problem Relation Age of Onset  . Lupus Mother        died at 31  . Breast cancer Maternal Aunt 35  . Breast cancer Maternal Grandmother 60  . Stomach cancer Paternal Grandfather   . Colon cancer Neg Hx   . Rectal cancer Neg Hx   . Esophageal cancer Neg Hx   . Inflammatory bowel disease Neg Hx   . Liver disease Neg Hx   . Pancreatic cancer Neg Hx     Past Surgical History:  Procedure Laterality Date  . AMPUTATION Left 05/13/2017   Procedure: Left small finger revision amputation ;  Surgeon: Roseanne Kaufman, MD;  Location: [facility];  Service: Orthopedics;  Laterality: Left;  60 mins  . CARPAL TUNNEL RELEASE Left  12/07/2020   Procedure: LEFT CARPAL TUNNEL RELEASE;  Surgeon: [name], MD;  Location: [facility];  Service: Orthopedics;  Laterality: Left;  . COLON SURGERY     15 years ago- fistula on colon removed  . COSMETIC SURGERY     right leg and abdomen  . DILATATION & CURETTAGE/HYSTEROSCOPY WITH MYOSURE N/A 06/14/2016   Procedure: [place];  Surgeon: Anastasio Auerbach, MD;  Location: [facility];  Service: Gynecology;  Laterality: N/A;  . HAND CARPECTOMY  12-24-2005   RIGHT-- INCLUDING  REMOVAL  WRIST WIRES AND I &D (EXICIOSNAL)  FOR OSTEOMYELITIS  . HYSTEROSCOPY  12.6.2012   w/removal of polyp  . HYSTEROSCOPY WITH D & C  08/18/2011   Procedure: DILATATION AND CURETTAGE (D&C) /HYSTEROSCOPY;  Surgeon: Anastasio Auerbach, MD;  Location: [facility];  Service: Gynecology;  Laterality: N/A;  . INCISION / DRAINAGE HAND / FINGER  X3  (2006 & 2007)   RIGHT CRUSH WOUND  . LESION REMOVAL  08/18/2011   Procedure: EXCISION VAGINAL LESION;  Surgeon: Anastasio Auerbach, MD;  Location: [facility];  Service: Gynecology;  Laterality: N/A;  . ORIF CARPAL BONE FRACTURE  08-31-05   I & D OF RIGHT HAND CRUSH INJURY/ OPEN FX'S AND INDEX FINGER RAY RESECTION/ COMPLICATED CLOSURE  . ORIF RADIAL FRACTURE  09-04-05   DISTAL RADIAL PLATE AND PARTIAL THUMB AMPUTATION  . RIGHT TOTAL WRIST FUSION W/ BONE GRAFT AND PLATE  [id number]   AND FLAP USING RIGHT GOIN MUSCLE  . skin cancer removal  2007   throat area  . TUBAL LIGATION  15 YRS AGO   Social History   Occupational History  . Occupation: Housekeeper   Tobacco Use  . Smoking status: Former Smoker    Packs/day: 1.50    Years: 28.00    Pack years: 42.00    Types: Cigarettes    Quit date: 11/29/2001    Years since quitting: 19.1  . Smokeless tobacco: Never Used  Vaping Use  . Vaping Use: Never used  Substance and Sexual Activity  . Alcohol use: Yes    Comment: wine daily  .  Drug use: Yes    Types: Marijuana    Comment: last smoked 12-08-20  . Sexual activity: Yes    Partners: Male    Birth control/protection: Post-menopausal    Comment: 1st intercourse 60 yo-More than 5 partners

## 2021-01-22 ENCOUNTER — Encounter: Payer: Self-pay | Admitting: Family Medicine

## 2021-01-22 ENCOUNTER — Other Ambulatory Visit: Payer: Self-pay

## 2021-01-22 ENCOUNTER — Ambulatory Visit (INDEPENDENT_AMBULATORY_CARE_PROVIDER_SITE_OTHER): Payer: Medicare Other | Admitting: Family Medicine

## 2021-01-22 VITALS — BP 128/72 | HR 70 | Temp 98.4°F | Resp 14 | Ht 61.0 in | Wt 134.0 lb

## 2021-01-22 DIAGNOSIS — R079 Chest pain, unspecified: Secondary | ICD-10-CM

## 2021-01-22 NOTE — Progress Notes (Signed)
Subjective:    Patient ID: [name], female    DOB: 02-Feb-1961, 60 y.o.   MRN: [id number]  Patient is a 60 year old Caucasian female who presents today complaining of chest pain.  She states that it began about 2 weeks ago.  She states that usually occurs first thing in the morning when she wakes up.  She describes it as a throbbing sometimes stabbing pain in her chest that comes and goes it will be sharp and passed instantaneously and then returned and passed instantaneously.  There is no shortness of breath with it.  She denies any angina.  I question the patient about her most physical strenuous activity.  She states that she walks the dog.  She also is still sexually active.  During these 2 strenuous activity she denies any chest pain or shortness of breath.  Usually the pain is worse when she is lying down or in a relaxed state.  I am unable to reproduce the pain with palpation.  She does admit to drinking wine in the afternoon to help relax and occasionally wine does cause her stomach to hurt.  She is also been getting some burning in the chest that she attributed to Crestor.  I am starting to suspect perhaps esophagitis or even gastritis.  She denies any hematemesis or melena or hematochezia.  EKG today shows normal sinus rhythm with normal intervals and a normal axis and no evidence of ischemia or infarction Past Medical History:  Diagnosis Date  . Anxiety    BILATERAL HANDS-- LEFT >RIGHT AND LEGS  . Aortic atherosclerosis ([place])   . Arthritis   . Asthma    NO INHALER  . Bed bug bite   . Blood transfusion without reported diagnosis   . CA - skin cancer   . Chronic pain due to injury RIGHT HAND/WRIST IN 2007  . Complication of anesthesia    unable to urinate after surgery  . COPD (chronic obstructive pulmonary disease) (HCC)    Stage 1  . Crush injury of hand RIGHT IN 2007   X 6 SURG'S  LAST ONE BEING TOTAL WRIST FUSION W/ BONE GRAFT AND PLATE  . Cubital tunnel syndrome     Left elbow  . Depression   . ETOH abuse   . GERD (gastroesophageal reflux disease)    05/11/17- not recently  . Hemorrhoids   . History of kidney stones    passed  . MRSA (methicillin resistant Staphylococcus aureus) carrier   . Neuromuscular disorder (HCC)    some fibromyalgia  . Neuropathy   . Osteopenia 07/2019   T score -1.9 FRAX 8.2% / 1.2%  . PTSD (post-traumatic stress disorder) 2007 RIGHT HAND CRUSH INJURY W/ 2 FINGER AMPUTATION  . Stroke ([place]) X2 CVA'S  IN 1990   LOSS OF VISION RIGHT Peripherial bilteral  . Substance abuse ([place])    hx: narcotic use  . UC (ulcerative colitis) ([place])   . Weakness of right leg CAUSED BY RETRIEVAL THIGH MUSCLE FOR FLAP RIGHT HAND INJURY-- 2006   RIGHT LEG GIVES OUT OCCASIONALLY   Past Surgical History:  Procedure Laterality Date  . AMPUTATION Left 05/13/2017   Procedure: Left small finger revision amputation ;  Surgeon: Roseanne Kaufman, MD;  Location: [facility];  Service: Orthopedics;  Laterality: Left;  60 mins  . CARPAL TUNNEL RELEASE Left 12/07/2020   Procedure: LEFT CARPAL TUNNEL RELEASE;  Surgeon: [name], MD;  Location: [facility];  Service: Orthopedics;  Laterality: Left;  .  COLON SURGERY     15 years ago- fistula on colon removed  . COSMETIC SURGERY     right leg and abdomen  . DILATATION & CURETTAGE/HYSTEROSCOPY WITH MYOSURE N/A 06/14/2016   Procedure: [place];  Surgeon: Anastasio Auerbach, MD;  Location: [facility];  Service: Gynecology;  Laterality: N/A;  . HAND CARPECTOMY  12-24-2005   RIGHT-- INCLUDING  REMOVAL  WRIST WIRES AND I &D (EXICIOSNAL)  FOR OSTEOMYELITIS  . HYSTEROSCOPY  12.6.2012   w/removal of polyp  . HYSTEROSCOPY WITH D & C  08/18/2011   Procedure: DILATATION AND CURETTAGE (D&C) /HYSTEROSCOPY;  Surgeon: Anastasio Auerbach, MD;  Location: [facility];  Service: Gynecology;  Laterality: N/A;  . INCISION / DRAINAGE HAND / FINGER  X3  (2006 & 2007)    RIGHT CRUSH WOUND  . LESION REMOVAL  08/18/2011   Procedure: EXCISION VAGINAL LESION;  Surgeon: Anastasio Auerbach, MD;  Location: [facility];  Service: Gynecology;  Laterality: N/A;  . ORIF CARPAL BONE FRACTURE  08-31-05   I & D OF RIGHT HAND CRUSH INJURY/ OPEN FX'S AND INDEX FINGER RAY RESECTION/ COMPLICATED CLOSURE  . ORIF RADIAL FRACTURE  09-04-05   DISTAL RADIAL PLATE AND PARTIAL THUMB AMPUTATION  . RIGHT TOTAL WRIST FUSION W/ BONE GRAFT AND PLATE  [id number]   AND FLAP USING RIGHT GOIN MUSCLE  . skin cancer removal  2007   throat area  . TUBAL LIGATION  15 YRS AGO   Current Outpatient Medications on File Prior to Visit  Medication Sig Dispense Refill  . albuterol (VENTOLIN HFA) 108 (90 Base) MCG/ACT inhaler TAKE 2 PUFFS BY MOUTH EVERY 6 HOURS AS NEEDED FOR WHEEZE OR SHORTNESS OF BREATH 8.5 each 2  . gabapentin (NEURONTIN) 300 MG capsule Take 1 capsule (300 mg total) by mouth 3 (three) times daily with meals as needed (nerve pain). 90 capsule 3  . Multiple Vitamin (MULTIVITAMIN WITH MINERALS) TABS tablet Take 1 tablet by mouth daily.    . NONFORMULARY OR COMPOUNDED ITEM Estradiol vaginal cream 5.40% insert 1 applicator twice weekly 90 each 4  . oxyCODONE-acetaminophen (PERCOCET) 5-325 MG tablet Take 1 tablet by mouth every 4 (four) hours as needed for severe pain. 30 tablet 0  . pantoprazole (PROTONIX) 40 MG tablet TAKE 1 TABLET BY MOUTH EVERY DAY 90 tablet 1  . umeclidinium bromide (INCRUSE ELLIPTA) 62.5 MCG/INH AEPB Inhale 1 puff into the lungs daily. 1 each 11   No current facility-administered medications on file prior to visit.   Allergies  Allergen Reactions  . Aspirin Shortness Of Breath    ASTHMATIC ATTACK  . Codeine Other (See Comments)    ABNORMAL BEHAVIOR  Increased in bad moods  NO CODEINE BASED MEDICATIONS   . Vicodin [Hydrocodone-Acetaminophen] Other (See Comments)    Extreme anxiety   Social History   Socioeconomic History  . Marital status:  Married    Spouse name: Not on file  . Number of children: 6  . Years of education: Not on file  . Highest education level: Not on file  Occupational History  . Occupation: Housekeeper   Tobacco Use  . Smoking status: Former Smoker    Packs/day: 1.50    Years: 28.00    Pack years: 42.00    Types: Cigarettes    Quit date: 11/29/2001    Years since quitting: 19.1  . Smokeless tobacco: Never Used  Vaping Use  . Vaping Use: Never used  Substance and  Sexual Activity  . Alcohol use: Yes    Comment: wine daily  . Drug use: Yes    Types: Marijuana    Comment: last smoked 12-08-20  . Sexual activity: Yes    Partners: Male    Birth control/protection: Post-menopausal    Comment: 1st intercourse 60 yo-More than 5 partners  Other Topics Concern  . Not on file  Social History Narrative  . Not on file   Social Determinants of Health   Financial Resource Strain: Not on file  Food Insecurity: Not on file  Transportation Needs: Not on file  Physical Activity: Not on file  Stress: Not on file  Social Connections: Not on file  Intimate Partner Violence: Not on file     Review of Systems  Gastrointestinal: Positive for abdominal pain.  All other systems reviewed and are negative.      Objective:   Physical Exam Vitals reviewed.  Constitutional:      General: She is not in acute distress.    Appearance: Normal appearance. She is normal weight. She is not ill-appearing, toxic-appearing or diaphoretic.  HENT:     Nose: Nose normal. No congestion or rhinorrhea.     Mouth/Throat:     Pharynx: No oropharyngeal exudate or posterior oropharyngeal erythema.  Cardiovascular:     Rate and Rhythm: Normal rate and regular rhythm.     Pulses: Normal pulses.     Heart sounds: Normal heart sounds. No murmur heard. No friction rub. No gallop.   Pulmonary:     Effort: Pulmonary effort is normal. No respiratory distress.     Breath sounds: Normal breath sounds. No stridor or decreased air  movement. No wheezing, rhonchi or rales.  Abdominal:     General: Bowel sounds are normal. There is no distension.     Palpations: Abdomen is soft. There is no mass.     Tenderness: There is no abdominal tenderness. There is no guarding or rebound.     Hernia: No hernia is present.  Neurological:     Mental Status: She is alert.           Assessment & Plan:  Chest pain, unspecified type - Plan: EKG 12-Lead  EKG today is reassuring.  Chest pain is extremely atypical.  Differential diagnosis includes musculoskeletal chest pain, GI related chest pain which is my suspicion, or cardiac sources of chest pain.  Patient request a referral to a cardiologist for a stress test and I believe that is entirely appropriate.  I will be happy to facilitate that.  However of also asked her to increase her Protonix to twice a day and to avoid drinking alcohol.  If the cardiology evaluation is normal, I would recommend an EGD if the pain persist

## 2021-02-17 ENCOUNTER — Other Ambulatory Visit: Payer: Self-pay

## 2021-02-17 MED ORDER — PANTOPRAZOLE SODIUM 40 MG PO TBEC: 1.0000 | DELAYED_RELEASE_TABLET | Freq: Every day | ORAL | 1 refills | Status: DC

## 2021-03-04 NOTE — Progress Notes (Signed)
Cardiology Office Note   Date:  03/05/2021   ID:  [name], DOB [birth year], MRN [id number]  PCP:  Ann Frizzle, MD  Cardiologist:   None Referring:  Ann Frizzle, MD  Chief Complaint  Patient presents with   Chest Pain       History of Present Illness: [name] is a 60 y.o. female who is referred by Ann Frizzle, MD for evaluation of chest pain.  She has no past cardiac history but she does have cardiac risk factors.  She has been getting a stabbing chest discomfort.  This has been going on for some time and seems to be relatively stable pattern.  And actually was worse when she was put on Lipitor recently for increased cholesterol.  She said she was having 10 out of 10 stabbing chest discomfort.  This was left of her sternum.  It would come on at rest and go away spontaneously.  She was not able to bring it on with exertion although she is somewhat limited because of the recent hand surgery.  She had carpal tunnel surgery in addition to having had traumatic amputation of the thumb some years ago on the other hand.  This limits her ability to do things but she can still do some light housekeeping without bringing on any symptoms.  She does get short of breath with activities but this is been going on chronically when she takes her inhaler.  She does think is getting slightly worse in the last 6 months.  She is not having any PND or orthopnea.  She is not having any palpitations, presyncope or syncope.   Past Medical History:  Diagnosis Date   Anxiety    BILATERAL HANDS-- LEFT >RIGHT AND LEGS   Aortic atherosclerosis (HCC)    Arthritis    Asthma    NO INHALER   Bed bug bite    Blood transfusion without reported diagnosis    CA - skin cancer    Chronic pain due to injury RIGHT HAND/WRIST IN 5732   Complication of anesthesia    unable to urinate after surgery   COPD (chronic obstructive pulmonary disease) ([place])    Stage 1   Crush injury of hand  RIGHT IN 2007   X 6 SURG'S  LAST ONE BEING TOTAL WRIST FUSION W/ BONE GRAFT AND PLATE   Cubital tunnel syndrome    Left elbow   Depression    ETOH abuse    GERD (gastroesophageal reflux disease)    05/11/17- not recently   Hemorrhoids    History of kidney stones    passed   MRSA (methicillin resistant Staphylococcus aureus) carrier    Neuromuscular disorder ([place])    some fibromyalgia   Neuropathy    Osteopenia 07/2019   T score -1.9 FRAX 8.2% / 1.2%   PTSD (post-traumatic stress disorder) 2007 RIGHT HAND CRUSH INJURY W/ 2 FINGER AMPUTATION   Stroke ([place]) X2 CVA'S  IN 1990   LOSS OF VISION RIGHT Peripherial bilteral   Substance abuse ([place])    hx: narcotic use   UC (ulcerative colitis) (HCC)    Weakness of right leg CAUSED BY RETRIEVAL THIGH MUSCLE FOR FLAP RIGHT HAND INJURY-- 2006   RIGHT LEG GIVES OUT OCCASIONALLY    Past Surgical History:  Procedure Laterality Date   AMPUTATION Left 05/13/2017   Procedure: Left small finger revision amputation ;  Surgeon: Roseanne Kaufman, MD;  Location: [place];  Service: Orthopedics;  Laterality: Left;  60 mins   CARPAL TUNNEL RELEASE Left 12/07/2020   Procedure: LEFT CARPAL TUNNEL RELEASE;  Surgeon: [name], MD;  Location: [place];  Service: Orthopedics;  Laterality: Left;   COLON SURGERY     15 years ago- fistula on colon removed   COSMETIC SURGERY     right leg and abdomen   DILATATION & CURETTAGE/HYSTEROSCOPY WITH MYOSURE N/A 06/14/2016   Procedure: DILATATION & CURETTAGE/HYSTEROSCOPY WITH MYOSURE;  Surgeon: Anastasio Auerbach, MD;  Location: [facility];  Service: Gynecology;  Laterality: N/A;   HAND CARPECTOMY  12/24/2005   RIGHT-- INCLUDING  REMOVAL  WRIST WIRES AND I &D (EXICIOSNAL)  FOR OSTEOMYELITIS   HYSTEROSCOPY WITH D & C  08/18/2011   Procedure: DILATATION AND CURETTAGE (D&C) /HYSTEROSCOPY;  Surgeon: Anastasio Auerbach, MD;  Location: [place];  Service: Gynecology;  Laterality: N/A;    INCISION / DRAINAGE HAND / FINGER  X3  (2006 & 2007)   RIGHT CRUSH WOUND   LESION REMOVAL  08/18/2011   Procedure: EXCISION VAGINAL LESION;  Surgeon: Anastasio Auerbach, MD;  Location: [place];  Service: Gynecology;  Laterality: N/A;   ORIF CARPAL BONE FRACTURE  08/31/2005   I & D OF RIGHT HAND CRUSH INJURY/ OPEN FX'S AND INDEX FINGER RAY RESECTION/ COMPLICATED CLOSURE   ORIF RADIAL FRACTURE  09/04/2005   DISTAL RADIAL PLATE AND PARTIAL THUMB AMPUTATION   RIGHT TOTAL WRIST FUSION W/ BONE GRAFT AND PLATE  16/06/9603   AND FLAP USING RIGHT GOIN MUSCLE   skin cancer removal  2007   throat area   TUBAL LIGATION  15 YRS AGO     Current Outpatient Medications  Medication Sig Dispense Refill   albuterol (VENTOLIN HFA) 108 (90 Base) MCG/ACT inhaler TAKE 2 PUFFS BY MOUTH EVERY 6 HOURS AS NEEDED FOR WHEEZE OR SHORTNESS OF BREATH 8.5 each 2   gabapentin (NEURONTIN) 300 MG capsule Take 1 capsule (300 mg total) by mouth 3 (three) times daily with meals as needed (nerve pain). 90 capsule 3   Multiple Vitamin (MULTIVITAMIN WITH MINERALS) TABS tablet Take 1 tablet by mouth daily.     NONFORMULARY OR COMPOUNDED ITEM Estradiol vaginal cream 5.40% insert 1 applicator twice weekly 90 each 4   pantoprazole (PROTONIX) 40 MG tablet Take 1 tablet (40 mg total) by mouth daily. (Patient taking differently: Take 1 tablet by mouth 2 (two) times daily.) 90 tablet 1   umeclidinium bromide (INCRUSE ELLIPTA) 62.5 MCG/INH AEPB Inhale 1 puff into the lungs daily. 1 each 11   No current facility-administered medications for this visit.    Allergies:   Aspirin, Codeine, and Vicodin [hydrocodone-acetaminophen]    Social History:  The patient  reports that she quit smoking about 19 years ago. Her smoking use included cigarettes. She has a 42.00 pack-year smoking history. She has never used smokeless tobacco. She reports current alcohol use. She reports current drug use. Drug: Marijuana.   Family  History:  The patient's family history includes Breast cancer (age of onset: 81) in her maternal aunt; Breast cancer (age of onset: 56) in her maternal grandmother; Lupus in her mother; Stomach cancer in her paternal grandfather.    ROS:  Please see the history of present illness.   Otherwise, review of systems are positive for none.   All other systems are reviewed and negative.    PHYSICAL EXAM: VS:  BP (!) 142/68   Pulse 71   Ht 5' 1.5" (  1.562 m)   Wt 133 lb 12.8 oz (60.7 kg)   SpO2 96%   BMI 24.87 kg/m  , BMI Body mass index is 24.87 kg/m. GENERAL:  Well appearing HEENT:  Pupils equal round and reactive, fundi not visualized, oral mucosa unremarkable NECK:  No jugular venous distention, waveform within normal limits, carotid upstroke brisk and symmetric, no bruits, no thyromegaly LYMPHATICS:  No cervical, inguinal adenopathy LUNGS:  Clear to auscultation bilaterally BACK:  No CVA tenderness CHEST:  Unremarkable HEART:  PMI not displaced or sustained,S1 and S2 within normal limits, no S3, no S4, no clicks, no rubs, no murmurs ABD:  Flat, positive bowel sounds normal in frequency in pitch, no bruits, no rebound, no guarding, no midline pulsatile mass, no hepatomegaly, no splenomegaly EXT:  2 plus pulses throughout, no edema, no cyanosis no clubbing missing thumb and index finger on the right hand SKIN:  No rashes no nodules NEURO:  Cranial nerves II through XII grossly intact, motor grossly intact throughout PSYCH:  Cognitively intact, oriented to person place and time    EKG:  EKG is not ordered today. The ekg ordered today demonstrates sinus rhythm, rate 61, leftward axis, RSR prime V1 and V2, no acute ST-T wave changes 01/22/2021   Recent Labs: 11/18/2020: ALT 13 12/04/2020: BUN 11; Creatinine, Ser 0.74; Hemoglobin 13.3; Platelets 318; Potassium 4.2; Sodium 137    Lipid Panel    Component Value Date/Time   CHOL 255 (H) 11/18/2020 0913   TRIG 208 (H) 11/18/2020 0913   HDL  65 11/18/2020 0913   CHOLHDL 3.9 11/18/2020 0913   VLDL 23 05/06/2015 0841   LDLCALC 153 (H) 11/18/2020 0913      Wt Readings from Last 3 Encounters:  03/05/21 133 lb 12.8 oz (60.7 kg)  01/22/21 134 lb (60.8 kg)  12/16/20 133 lb (60.3 kg)      Other studies Reviewed: Additional studies/ records that were reviewed today include: Labs and outside EKG. Review of the above records demonstrates:  Please see elsewhere in the note.     ASSESSMENT AND PLAN:  CHEST PAIN: Her chest pain has some anginal but predominantly nonanginal symptoms. I will bring the patient back for a POET (Plain Old Exercise Test). This will allow me to screen for obstructive coronary disease, risk stratify and very importantly provide a prescription for exercise.  DYSLIPIDEMIA: Her LDL was 153 with an HDL of 65.  She did not tolerate statins.  We talked which I think would be reasonable for management and follow-up with lipids in about 6 months.   Current medicines are reviewed at length with the patient today.  The patient does not have concerns regarding medicines.  The following changes have been made:  no change  Labs/ tests ordered today include:   Orders Placed This Encounter  Procedures   EXERCISE TOLERANCE TEST (ETT)      Disposition:   FU with me as needed.      Signed, Minus Breeding, MD  03/05/2021 11:05 AM    [place]

## 2021-03-05 ENCOUNTER — Ambulatory Visit: Payer: Medicare Other | Admitting: Cardiology

## 2021-03-05 ENCOUNTER — Encounter: Payer: Self-pay | Admitting: Cardiology

## 2021-03-05 ENCOUNTER — Other Ambulatory Visit: Payer: Self-pay

## 2021-03-05 VITALS — BP 142/68 | HR 71 | Ht 61.5 in | Wt 133.8 lb

## 2021-03-05 DIAGNOSIS — R072 Precordial pain: Secondary | ICD-10-CM

## 2021-03-05 NOTE — Patient Instructions (Signed)
Medication Instructions:  Your physician recommends that you continue on your current medications as directed. Please refer to the Current Medication list given to you today.  *If you need a refill on your cardiac medications before your next appointment, please call your pharmacy*   Lab Work: None ordered.    Testing/Procedures: Your physician has requested that you have an exercise tolerance test. For further information please visit [email or web address]. Please also follow instruction sheet, as given.    Follow-Up: At [facility], you and your health needs are our priority.  As part of our continuing mission to provide you with exceptional heart care, we have created designated Provider Care Teams.  These Care Teams include your primary Cardiologist (physician) and Advanced Practice Providers (APPs -  Physician Assistants and Nurse Practitioners) who all work together to provide you with the care you need, when you need it.  We recommend signing up for the patient portal called "MyChart".  Sign up information is provided on this After Visit Summary.  MyChart is used to connect with patients for Virtual Visits (Telemedicine).  Patients are able to view lab/test results, encounter notes, upcoming appointments, etc.  Non-urgent messages can be sent to your provider as well.   To learn more about what you can do with MyChart, go to [email or web address].    Your next appointment:   As needed  The format for your next appointment:   In Person  Provider:   Minus Breeding, MD

## 2021-03-08 ENCOUNTER — Other Ambulatory Visit: Payer: Self-pay | Admitting: Family Medicine

## 2021-03-16 ENCOUNTER — Telehealth (HOSPITAL_COMMUNITY): Payer: Self-pay | Admitting: *Deleted

## 2021-03-16 NOTE — Telephone Encounter (Signed)
Close encounter 

## 2021-03-18 ENCOUNTER — Telehealth (HOSPITAL_COMMUNITY): Payer: Self-pay | Admitting: *Deleted

## 2021-03-18 ENCOUNTER — Ambulatory Visit (HOSPITAL_COMMUNITY)
Admission: RE | Admit: 2021-03-18 | Payer: Medicare Other | Source: Ambulatory Visit | Attending: Cardiology | Admitting: Cardiology

## 2021-03-18 NOTE — Telephone Encounter (Signed)
Close encounter 

## 2021-03-23 ENCOUNTER — Ambulatory Visit (HOSPITAL_COMMUNITY)
Admission: RE | Admit: 2021-03-23 | Discharge: 2021-03-23 | Disposition: A | Discharge status: Home / Self Care | Payer: Medicare Other | Source: Ambulatory Visit | Attending: Cardiovascular Disease | Admitting: Cardiovascular Disease

## 2021-03-23 ENCOUNTER — Other Ambulatory Visit: Payer: Self-pay

## 2021-03-23 DIAGNOSIS — R072 Precordial pain: Secondary | ICD-10-CM | POA: Diagnosis not present

## 2021-03-23 LAB — EXERCISE TOLERANCE TEST
Estimated workload: 8.6 METS
Exercise duration (min): 7 min
Exercise duration (sec): 2 s
MPHR: 159 {beats}/min
Peak HR: 121 {beats}/min
Percent HR: 76 %
Rest HR: 54 {beats}/min

## 2021-03-26 ENCOUNTER — Encounter: Payer: Self-pay | Admitting: *Deleted

## 2021-05-13 ENCOUNTER — Ambulatory Visit (INDEPENDENT_AMBULATORY_CARE_PROVIDER_SITE_OTHER): Payer: Medicare Other | Admitting: Family Medicine

## 2021-05-13 ENCOUNTER — Other Ambulatory Visit: Payer: Self-pay

## 2021-05-13 ENCOUNTER — Ambulatory Visit
Admission: RE | Admit: 2021-05-13 | Discharge: 2021-05-13 | Disposition: A | Discharge status: Home / Self Care | Payer: Medicare Other | Source: Ambulatory Visit | Attending: Family Medicine | Admitting: Family Medicine

## 2021-05-13 ENCOUNTER — Encounter: Payer: Self-pay | Admitting: Family Medicine

## 2021-05-13 VITALS — BP 126/72 | HR 68 | Temp 99.0°F | Resp 14 | Ht 61.5 in | Wt 134.0 lb

## 2021-05-13 DIAGNOSIS — J449 Chronic obstructive pulmonary disease, unspecified: Secondary | ICD-10-CM | POA: Diagnosis not present

## 2021-05-13 DIAGNOSIS — R079 Chest pain, unspecified: Secondary | ICD-10-CM | POA: Diagnosis not present

## 2021-05-13 DIAGNOSIS — R059 Cough, unspecified: Secondary | ICD-10-CM | POA: Diagnosis not present

## 2021-05-13 DIAGNOSIS — R0600 Dyspnea, unspecified: Secondary | ICD-10-CM | POA: Diagnosis not present

## 2021-05-13 NOTE — Progress Notes (Signed)
Subjective:    Patient ID: [name], female    DOB: 07-10-1961, 60 y.o.   MRN: [id number] 01/22/21 Patient is a 60 year old Caucasian female who presents today complaining of chest pain.  She states that it began about 2 weeks ago.  She states that usually occurs first thing in the morning when she wakes up.  She describes it as a throbbing sometimes stabbing pain in her chest that comes and goes it will be sharp and passed instantaneously and then returned and passed instantaneously.  There is no shortness of breath with it.  She denies any angina.  I question the patient about her most physical strenuous activity.  She states that she walks the dog.  She also is still sexually active.  During these 2 strenuous activity she denies any chest pain or shortness of breath.  Usually the pain is worse when she is lying down or in a relaxed state.  I am unable to reproduce the pain with palpation.  She does admit to drinking wine in the afternoon to help relax and occasionally wine does cause her stomach to hurt.  She is also been getting some burning in the chest that she attributed to Crestor.  I am starting to suspect perhaps esophagitis or even gastritis.  She denies any hematemesis or melena or hematochezia.  EKG today shows normal sinus rhythm with normal intervals and a normal axis and no evidence of ischemia or infarction.  At that time, my plan was:  EKG today is reassuring.  Chest pain is extremely atypical.  Differential diagnosis includes musculoskeletal chest pain, GI related chest pain which is my suspicion, or cardiac sources of chest pain.  Patient request a referral to a cardiologist for a stress test and I believe that is entirely appropriate.  I will be happy to facilitate that.  However of also asked her to increase her Protonix to twice a day and to avoid drinking alcohol.  If the cardiology evaluation is normal, I would recommend an EGD if the pain persist  05/13/21 Patient had a  CBC in March that showed no evidence of anemia with a hemoglobin of 13.  She saw cardiology who performed an exercise treadmill test which was normal.  However she continues to report dyspnea on exertion.  She is using Incruse daily and having to use rescue albuterol 2-3 times a day in order to accomplish anything such as washing her car or cleaning her house.  She is denying fevers or chills or hemoptysis or purulent sputum.  Today on exam, her lungs are relatively clear except for a few scattered isolated expiratory wheezes.  There is no peripheral edema.  There is no evidence of fluid overload.  Heart rate is normal and in sinus rhythm. Past Medical History:  Diagnosis Date   Anxiety    BILATERAL HANDS-- LEFT >RIGHT AND LEGS   Aortic atherosclerosis (HCC)    Arthritis    Asthma    NO INHALER   Bed bug bite    Blood transfusion without reported diagnosis    CA - skin cancer    Chronic pain due to injury RIGHT HAND/WRIST IN 1572   Complication of anesthesia    unable to urinate after surgery   COPD (chronic obstructive pulmonary disease) (HCC)    Stage 1   Crush injury of hand RIGHT IN 2007   X 6 SURG'S  LAST ONE BEING TOTAL WRIST FUSION W/ BONE GRAFT AND PLATE   Cubital tunnel  syndrome    Left elbow   Depression    ETOH abuse    GERD (gastroesophageal reflux disease)    05/11/17- not recently   Hemorrhoids    History of kidney stones    passed   MRSA (methicillin resistant Staphylococcus aureus) carrier    Neuromuscular disorder ([place])    some fibromyalgia   Neuropathy    Osteopenia 07/2019   T score -1.9 FRAX 8.2% / 1.2%   PTSD (post-traumatic stress disorder) 2007 RIGHT HAND CRUSH INJURY W/ 2 FINGER AMPUTATION   Stroke ([place]) X2 CVA'S  IN 1990   LOSS OF VISION RIGHT Peripherial bilteral   Substance abuse ([place])    hx: narcotic use   UC (ulcerative colitis) (HCC)    Weakness of right leg CAUSED BY RETRIEVAL THIGH MUSCLE FOR FLAP RIGHT HAND INJURY-- 2006   RIGHT LEG GIVES OUT  OCCASIONALLY   Past Surgical History:  Procedure Laterality Date   AMPUTATION Left 05/13/2017   Procedure: Left small finger revision amputation ;  Surgeon: Roseanne Kaufman, MD;  Location: [place];  Service: Orthopedics;  Laterality: Left;  60 mins   CARPAL TUNNEL RELEASE Left 12/07/2020   Procedure: LEFT CARPAL TUNNEL RELEASE;  Surgeon: [name], MD;  Location: [place];  Service: Orthopedics;  Laterality: Left;   COLON SURGERY     15 years ago- fistula on colon removed   COSMETIC SURGERY     right leg and abdomen   DILATATION & CURETTAGE/HYSTEROSCOPY WITH MYOSURE N/A 06/14/2016   Procedure: DILATATION & CURETTAGE/HYSTEROSCOPY WITH MYOSURE;  Surgeon: Anastasio Auerbach, MD;  Location: [facility];  Service: Gynecology;  Laterality: N/A;   HAND CARPECTOMY  12/24/2005   RIGHT-- INCLUDING  REMOVAL  WRIST WIRES AND I &D (EXICIOSNAL)  FOR OSTEOMYELITIS   HYSTEROSCOPY WITH D & C  08/18/2011   Procedure: DILATATION AND CURETTAGE (D&C) /HYSTEROSCOPY;  Surgeon: Anastasio Auerbach, MD;  Location: [place];  Service: Gynecology;  Laterality: N/A;   INCISION / DRAINAGE HAND / FINGER  X3  (2006 & 2007)   RIGHT CRUSH WOUND   LESION REMOVAL  08/18/2011   Procedure: EXCISION VAGINAL LESION;  Surgeon: Anastasio Auerbach, MD;  Location: [place];  Service: Gynecology;  Laterality: N/A;   ORIF CARPAL BONE FRACTURE  08/31/2005   I & D OF RIGHT HAND CRUSH INJURY/ OPEN FX'S AND INDEX FINGER RAY RESECTION/ COMPLICATED CLOSURE   ORIF RADIAL FRACTURE  09/04/2005   DISTAL RADIAL PLATE AND PARTIAL THUMB AMPUTATION   RIGHT TOTAL WRIST FUSION W/ BONE GRAFT AND PLATE  15/40/0867   AND FLAP USING RIGHT GOIN MUSCLE   skin cancer removal  2007   throat area   TUBAL LIGATION  15 YRS AGO   Current Outpatient Medications on File Prior to Visit  Medication Sig Dispense Refill   albuterol (VENTOLIN HFA) 108 (90 Base) MCG/ACT inhaler TAKE 2 PUFFS BY MOUTH EVERY 6 HOURS  AS NEEDED FOR WHEEZE OR SHORTNESS OF BREATH 8.5 each 2   gabapentin (NEURONTIN) 300 MG capsule TAKE 1 CAPSULE (300 MG TOTAL) BY MOUTH 3 (THREE) TIMES DAILY WITH MEALS AS NEEDED (NERVE PAIN). 90 capsule 3   Multiple Vitamin (MULTIVITAMIN WITH MINERALS) TABS tablet Take 1 tablet by mouth daily.     pantoprazole (PROTONIX) 40 MG tablet Take 1 tablet (40 mg total) by mouth daily. (Patient taking differently: Take 1 tablet by mouth 2 (two) times daily.) 90 tablet 1   umeclidinium bromide (INCRUSE  ELLIPTA) 62.5 MCG/INH AEPB Inhale 1 puff into the lungs daily. 1 each 11   No current facility-administered medications on file prior to visit.   Allergies  Allergen Reactions   Aspirin Shortness Of Breath    ASTHMATIC ATTACK   Codeine Other (See Comments)    ABNORMAL BEHAVIOR  Increased in bad moods  NO CODEINE BASED MEDICATIONS    Vicodin [Hydrocodone-Acetaminophen] Other (See Comments)    Extreme anxiety   Social History   Socioeconomic History   Marital status: Married    Spouse name: Not on file   Number of children: 6   Years of education: Not on file   Highest education level: Not on file  Occupational History   Occupation: Housekeeper   Tobacco Use   Smoking status: Former    Packs/day: 1.50    Years: 28.00    Pack years: 42.00    Types: Cigarettes    Quit date: 11/29/2001    Years since quitting: 19.4   Smokeless tobacco: Never  Vaping Use   Vaping Use: Never used  Substance and Sexual Activity   Alcohol use: Yes    Comment: wine daily   Drug use: Yes    Types: Marijuana    Comment: last smoked 12-08-20   Sexual activity: Yes    Partners: Male    Birth control/protection: Post-menopausal    Comment: 1st intercourse 60 yo-More than 5 partners  Other Topics Concern   Not on file  Social History Narrative   Lives with husband.     Social Determinants of Health   Financial Resource Strain: Not on file  Food Insecurity: Not on file  Transportation Needs: Not on file   Physical Activity: Not on file  Stress: Not on file  Social Connections: Not on file  Intimate Partner Violence: Not on file     Review of Systems  Gastrointestinal:  Positive for abdominal pain.  All other systems reviewed and are negative.     Objective:   Physical Exam Vitals reviewed.  Constitutional:      General: She is not in acute distress.    Appearance: Normal appearance. She is normal weight. She is not ill-appearing, toxic-appearing or diaphoretic.  HENT:     Nose: Nose normal. No congestion or rhinorrhea.     Mouth/Throat:     Pharynx: No oropharyngeal exudate or posterior oropharyngeal erythema.  Cardiovascular:     Rate and Rhythm: Normal rate and regular rhythm.     Pulses: Normal pulses.     Heart sounds: Normal heart sounds. No murmur heard.   No friction rub. No gallop.  Pulmonary:     Effort: Pulmonary effort is normal. No respiratory distress.     Breath sounds: Normal breath sounds. No stridor or decreased air movement. No wheezing, rhonchi or rales.  Abdominal:     General: Bowel sounds are normal. There is no distension.     Palpations: Abdomen is soft. There is no mass.     Tenderness: There is no abdominal tenderness. There is no guarding or rebound.     Hernia: No hernia is present.  Neurological:     Mental Status: She is alert.          Assessment & Plan:  Dyspnea, unspecified type - Plan: DG Chest 2 View Patient has tried and failed Trelegy in the past.  Of asked her to discontinue Incruse and replace with Breztri 2 puffs inhaled twice daily.  She can continue to use albuterol sparingly  as needed for rescue but I encouraged her not to abuse this.  I would like to send her for a chest x-ray as well to rule out structural abnormalities.  If the x-ray is normal and symptoms improve on the long-acting triple therapy, no further work-up is necessary.  If chest x-ray is normal and triple therapy makes no benefit, I would recommend an  echocardiogram and repeat lab work

## 2021-05-19 ENCOUNTER — Telehealth: Payer: Self-pay | Admitting: *Deleted

## 2021-05-19 DIAGNOSIS — J438 Other emphysema: Secondary | ICD-10-CM

## 2021-05-19 DIAGNOSIS — J449 Chronic obstructive pulmonary disease, unspecified: Secondary | ICD-10-CM

## 2021-05-19 DIAGNOSIS — R0600 Dyspnea, unspecified: Secondary | ICD-10-CM

## 2021-05-19 NOTE — Telephone Encounter (Signed)
Received call from patient.   Reports that she was trying samples of Breztri and noted increased urinary retention.   States that she stopped using samples and is now able to urinate normally. Reports that she did have similar SE to another inhaler.   States that she has resumed using Incruse and Albuterol. Reports that she is using Albuterol 3-4x daily.   Please advise.

## 2021-05-20 MED ORDER — PREDNISONE 20 MG PO TABS: ORAL_TABLET | ORAL | 0 refills | Status: DC

## 2021-05-20 NOTE — Telephone Encounter (Signed)
Call placed to patient and patient made aware.   Agreeable to plan.   Prescription sent to pharmacy.   Referral orders placed.

## 2021-05-20 NOTE — Addendum Note (Signed)
Addended by: Sheral Flow on: 05/20/2021 05:24 PM   Modules accepted: Orders

## 2021-05-25 DIAGNOSIS — L820 Inflamed seborrheic keratosis: Secondary | ICD-10-CM | POA: Diagnosis not present

## 2021-06-09 ENCOUNTER — Other Ambulatory Visit: Payer: Self-pay | Admitting: Family Medicine

## 2021-06-09 DIAGNOSIS — Z1231 Encounter for screening mammogram for malignant neoplasm of breast: Secondary | ICD-10-CM

## 2021-06-17 ENCOUNTER — Telehealth: Payer: Self-pay | Admitting: Emergency Medicine

## 2021-06-17 NOTE — Telephone Encounter (Signed)
Pt is wanting to change providers.  She is currently seeing RB.  Please advise if you are ok with her changing providers.  Any recs as to who she should see?

## 2021-06-17 NOTE — Telephone Encounter (Signed)
Chart reviewed.  Her principal problem has been COPD so I think any of our providers would be appropriate to see her and take care of her.  She has seen Dr. Vaughan Browner before.  I would have her see whichever provider can fit her in next available unless she has stated that she has a preference.

## 2021-06-18 NOTE — Telephone Encounter (Signed)
Dr. Vaughan Browner per office policy would you be willing to see pt?

## 2021-06-18 NOTE — Telephone Encounter (Signed)
Called and spoke with pt and she is fine with seeing anyone else in the office.

## 2021-06-21 NOTE — Telephone Encounter (Signed)
I am ok seeing the patient but can be seen sooner by one of the new docs as I don't have immediate availability for new consult. Please schedule with whoever is next available.

## 2021-06-21 NOTE — Telephone Encounter (Signed)
Spoke with the [name] and scheduled appt with Dr Verlee Monte for 2 pm on 06/23/21.

## 2021-06-22 NOTE — Progress Notes (Signed)
Synopsis: Referred for COPD by Susy Frizzle, MD  Subjective:   PATIENT ID: [name] GENDER: female DOB: [birth year], MRN: [id number]  Chief Complaint  Patient presents with   Follow-up    Pt states she has been doing okay since last visit and denies any complaints.   60yF with COPD on serevent (urinary retention with LAMA, 'side effects' with ICS), asthma, UC, GERD, 42 py smoker quit 2003  Right now the only inhaler she's using is albuterol. She says she used serevent for some time but stopped due to an adverse effect that she can't recall.   Her PCP prescribed incruse and she tried it for 3 months but then got some throat irritation.   With trelegy she had urinary retention.   Hoarseness with ICS limits her ability to use any inhaler with ICS.   She wonders if her wheezing is driven more by allergy. She does have a new dog and wonders if her recent increased wheezing is related.  She just finished a course of steroids for dyspnea attributed to COPD. She emphasizes that her throat is where she feels tightness, wheeze. She saw an ENT a long time ago.   She denies sinonasal congestion, PND but does have GERD that requires twice daily ppi.     Otherwise pertinent review of systems is negative.  Smokes MJ now only with water bong  Past Medical History:  Diagnosis Date   Anxiety    BILATERAL HANDS-- LEFT >RIGHT AND LEGS   Aortic atherosclerosis (HCC)    Arthritis    Asthma    NO INHALER   Bed bug bite    Blood transfusion without reported diagnosis    CA - skin cancer    Chronic pain due to injury RIGHT HAND/WRIST IN 8127   Complication of anesthesia    unable to urinate after surgery   COPD (chronic obstructive pulmonary disease) ([place])    Stage 1   Crush injury of hand RIGHT IN 2007   X 6 SURG'S  LAST ONE BEING TOTAL WRIST FUSION W/ BONE GRAFT AND PLATE   Cubital tunnel syndrome    Left elbow   Depression    ETOH abuse    GERD (gastroesophageal reflux  disease)    05/11/17- not recently   Hemorrhoids    History of kidney stones    passed   MRSA (methicillin resistant Staphylococcus aureus) carrier    Neuromuscular disorder ([place])    some fibromyalgia   Neuropathy    Osteopenia 07/2019   T score -1.9 FRAX 8.2% / 1.2%   PTSD (post-traumatic stress disorder) 2007 RIGHT HAND CRUSH INJURY W/ 2 FINGER AMPUTATION   Stroke ([place]) X2 CVA'S  IN 1990   LOSS OF VISION RIGHT Peripherial bilteral   Substance abuse ([place])    hx: narcotic use   UC (ulcerative colitis) (HCC)    Weakness of right leg CAUSED BY RETRIEVAL THIGH MUSCLE FOR FLAP RIGHT HAND INJURY-- 2006   RIGHT LEG GIVES OUT OCCASIONALLY     Family History  Problem Relation Age of Onset   Lupus Mother        died at 31   Breast cancer Maternal Aunt 35   Breast cancer Maternal Grandmother 60   Stomach cancer Paternal Grandfather    Colon cancer Neg Hx    Rectal cancer Neg Hx    Esophageal cancer Neg Hx    Inflammatory bowel disease Neg Hx    Liver disease Neg Hx  Pancreatic cancer Neg Hx      Past Surgical History:  Procedure Laterality Date   AMPUTATION Left 05/13/2017   Procedure: Left small finger revision amputation ;  Surgeon: Roseanne Kaufman, MD;  Location: [facility];  Service: Orthopedics;  Laterality: Left;  60 mins   CARPAL TUNNEL RELEASE Left 12/07/2020   Procedure: LEFT CARPAL TUNNEL RELEASE;  Surgeon: [name], MD;  Location: [facility];  Service: Orthopedics;  Laterality: Left;   COLON SURGERY     15 years ago- fistula on colon removed   COSMETIC SURGERY     right leg and abdomen   DILATATION & CURETTAGE/HYSTEROSCOPY WITH MYOSURE N/A 06/14/2016   Procedure: DILATATION & CURETTAGE/HYSTEROSCOPY WITH MYOSURE;  Surgeon: Anastasio Auerbach, MD;  Location: [facility];  Service: Gynecology;  Laterality: N/A;   HAND CARPECTOMY  12/24/2005   RIGHT-- INCLUDING  REMOVAL  WRIST WIRES AND I &D (EXICIOSNAL)  FOR OSTEOMYELITIS   HYSTEROSCOPY WITH D & C   08/18/2011   Procedure: DILATATION AND CURETTAGE (D&C) /HYSTEROSCOPY;  Surgeon: Anastasio Auerbach, MD;  Location: [facility];  Service: Gynecology;  Laterality: N/A;   INCISION / DRAINAGE HAND / FINGER  X3  (2006 & 2007)   RIGHT CRUSH WOUND   LESION REMOVAL  08/18/2011   Procedure: EXCISION VAGINAL LESION;  Surgeon: Anastasio Auerbach, MD;  Location: [facility];  Service: Gynecology;  Laterality: N/A;   ORIF CARPAL BONE FRACTURE  08/31/2005   I & D OF RIGHT HAND CRUSH INJURY/ OPEN FX'S AND INDEX FINGER RAY RESECTION/ COMPLICATED CLOSURE   ORIF RADIAL FRACTURE  09/04/2005   DISTAL RADIAL PLATE AND PARTIAL THUMB AMPUTATION   RIGHT TOTAL WRIST FUSION W/ BONE GRAFT AND PLATE  87/57/9728   AND FLAP USING RIGHT GOIN MUSCLE   skin cancer removal  2007   throat area   TUBAL LIGATION  15 YRS AGO    Social History   Socioeconomic History   Marital status: Married    Spouse name: Not on file   Number of children: 6   Years of education: Not on file   Highest education level: Not on file  Occupational History   Occupation: Housekeeper   Tobacco Use   Smoking status: Former    Packs/day: 1.50    Years: 28.00    Pack years: 42.00    Types: Cigarettes    Quit date: 11/29/2001    Years since quitting: 19.5   Smokeless tobacco: Never  Vaping Use   Vaping Use: Never used  Substance and Sexual Activity   Alcohol use: Yes    Comment: wine daily   Drug use: Yes    Types: Marijuana    Comment: last smoked 12-08-20   Sexual activity: Yes    Partners: Male    Birth control/protection: Post-menopausal    Comment: 1st intercourse 60 yo-More than 5 partners  Other Topics Concern   Not on file  Social History Narrative   Lives with husband.     Social Determinants of Health   Financial Resource Strain: Not on file  Food Insecurity: Not on file  Transportation Needs: Not on file  Physical Activity: Not on file  Stress: Not on file  Social Connections: Not  on file  Intimate Partner Violence: Not on file     Allergies  Allergen Reactions   Aspirin Shortness Of Breath    ASTHMATIC ATTACK   Breztri Aerosphere [Budeson-Glycopyrrol-Formoterol] Other (See Comments)    Urinary  retention   Codeine Other (See Comments)    ABNORMAL BEHAVIOR  Increased in bad moods  NO CODEINE BASED MEDICATIONS    Vicodin [Hydrocodone-Acetaminophen] Other (See Comments)    Extreme anxiety     Outpatient Medications Prior to Visit  Medication Sig Dispense Refill   albuterol (VENTOLIN HFA) 108 (90 Base) MCG/ACT inhaler TAKE 2 PUFFS BY MOUTH EVERY 6 HOURS AS NEEDED FOR WHEEZE OR SHORTNESS OF BREATH 8.5 each 2   gabapentin (NEURONTIN) 300 MG capsule TAKE 1 CAPSULE (300 MG TOTAL) BY MOUTH 3 (THREE) TIMES DAILY WITH MEALS AS NEEDED (NERVE PAIN). 90 capsule 3   Multiple Vitamin (MULTIVITAMIN WITH MINERALS) TABS tablet Take 1 tablet by mouth daily.     pantoprazole (PROTONIX) 40 MG tablet Take 1 tablet (40 mg total) by mouth daily. (Patient taking differently: Take 40 mg by mouth 2 (two) times daily.) 90 tablet 1   predniSONE (DELTASONE) 20 MG tablet Take 3 tablets (65m) on days 1-2. Take 2 tablets (491m on days 3-4. Take 1 tablet (2035mon days 5-6, then stop. 12 tablet 0   umeclidinium bromide (INCRUSE ELLIPTA) 62.5 MCG/INH AEPB Inhale 1 puff into the lungs daily. 1 each 11   No facility-administered medications prior to visit.       Objective:   Physical Exam:  General appearance: [age]o., female, NAD, conversant  Eyes: anicteric sclerae, moist conjunctivae; no lid-lag; PERRL, tracking appropriately HENT: NCAT; oropharynx, MMM, no mucosal ulcerations; normal hard and soft palate Neck: Trachea midline; no lymphadenopathy, no JVD Lungs: CTAB, no crackles, no wheeze, with normal respiratory effort CV: RRR, no MRGs  Abdomen: Soft, non-tender; non-distended, BS present  Extremities: No peripheral edema, radial and DP pulses present bilaterally  Skin: Normal  temperature, turgor and texture; no rash Psych: Appropriate affect Neuro: Alert and oriented to person and place, no focal deficit    Vitals:   06/23/21 1358  BP: 124/78  Pulse: 61  Temp: 98 F (36.7 C)  TempSrc: Oral  SpO2: 99%  Weight: 137 lb 6.4 oz (62.3 kg)  Height: 5' 3"  (1.6 m)   99% on RA BMI Readings from Last 3 Encounters:  06/23/21 24.34 kg/m  05/13/21 24.91 kg/m  03/05/21 24.87 kg/m   Wt Readings from Last 3 Encounters:  06/23/21 137 lb 6.4 oz (62.3 kg)  05/13/21 134 lb (60.8 kg)  03/05/21 133 lb 12.8 oz (60.7 kg)     CBC    Component Value Date/Time   WBC 10.4 12/04/2020 1130   RBC 4.09 12/04/2020 1130   HGB 13.3 12/04/2020 1130   HCT 40.0 12/04/2020 1130   PLT 318 12/04/2020 1130   MCV 97.8 12/04/2020 1130   MCH 32.5 12/04/2020 1130   MCHC 33.3 12/04/2020 1130   RDW 12.0 12/04/2020 1130   LYMPHSABS 3,724 11/18/2020 0913   MONOABS 0.8 07/27/2018 1109   EOSABS 310 11/18/2020 0913   BASOSABS 86 11/18/2020 0913    Eosinophils 2-300 typically  Chest Imaging:  CXR reviewed by me with likely atelectasis/scar in lingula similar to that from CT A/P 06/11/20  Pulmonary Functions Testing Results: PFT Results Latest Ref Rng & Units 04/29/2019  FVC-Pre L 2.62  FVC-Predicted Pre % 84  FVC-Post L 2.66  FVC-Predicted Post % 86  Pre FEV1/FVC % % 57  Post FEV1/FCV % % 57  FEV1-Pre L 1.49  FEV1-Predicted Pre % 62  FEV1-Post L 1.52  DLCO uncorrected ml/min/mmHg 18.45  DLCO UNC% % 97  DLVA Predicted % 79  TLC  L 6.67  TLC % Predicted % 140  RV % Predicted % 196   Moderate obstruction but with clinically important improvement in FEV1 relative to PFTs 2018. Also has hyperinflation, air trapping, normal DLCO, normal flow volume loops. Neg BD response    Echocardiogram:   Negative ECG stress test 03/2021      Assessment & Plan:   # COPD gold functional group D: variety of intolerances to controller inhalers. ICS gets throat irritation despite  rinsing mouth, LAMA gets urinary retention, LABA she is unsure but thinks she hasn't tolerated before. She has lower airway wheeze on exam today although she mentions frequent sensation of throat tightness when she has AECOPD. She's also concerned about dog allergy and says she benefited from allergy shots in past - will refer to allergy. If not responsive to LABA/ low dose ICS with spacer and still has throat tightness will consider referral to ENT for possible irritable larynx from GERD vs VCD.   Plan: - CBC/diff, IgE today - Spirometry next visit - advair HFA with lowest possible dose of ICS 2 puffs twice daily with spacer to improve delivery to lung and hopefully avoid the throat irritation she's had with ICS in past.  - allergy referral for RAST  - declines flutter valve  - CT Chest now given ?tram track sign on CXR just prior to most recent. Next visit discuss lung cancer screening referral.     Maryjane Hurter, MD [facility] 06/23/2021 2:42 PM

## 2021-06-23 ENCOUNTER — Ambulatory Visit: Payer: Medicare Other | Admitting: Student

## 2021-06-23 ENCOUNTER — Other Ambulatory Visit: Payer: Self-pay

## 2021-06-23 ENCOUNTER — Encounter: Payer: Self-pay | Admitting: Student

## 2021-06-23 VITALS — BP 124/78 | HR 61 | Temp 98.0°F | Ht 63.0 in | Wt 137.4 lb

## 2021-06-23 DIAGNOSIS — R9389 Abnormal findings on diagnostic imaging of other specified body structures: Secondary | ICD-10-CM

## 2021-06-23 DIAGNOSIS — Z23 Encounter for immunization: Secondary | ICD-10-CM

## 2021-06-23 DIAGNOSIS — J449 Chronic obstructive pulmonary disease, unspecified: Secondary | ICD-10-CM

## 2021-06-23 DIAGNOSIS — R059 Cough, unspecified: Secondary | ICD-10-CM

## 2021-06-23 LAB — CBC WITH DIFFERENTIAL/PLATELET
Basophils Absolute: 0.1 10*3/uL (ref 0.0–0.1)
Basophils Relative: 0.7 % (ref 0.0–3.0)
Eosinophils Absolute: 0.4 10*3/uL (ref 0.0–0.7)
Eosinophils Relative: 4 % (ref 0.0–5.0)
HCT: 38.6 % (ref 36.0–46.0)
Hemoglobin: 12.7 g/dL (ref 12.0–15.0)
Lymphocytes Relative: 34.5 % (ref 12.0–46.0)
Lymphs Abs: 3.5 10*3/uL (ref 0.7–4.0)
MCHC: 33 g/dL (ref 30.0–36.0)
MCV: 97.2 fl (ref 78.0–100.0)
Monocytes Absolute: 0.8 10*3/uL (ref 0.1–1.0)
Monocytes Relative: 8 % (ref 3.0–12.0)
Neutro Abs: 5.4 10*3/uL (ref 1.4–7.7)
Neutrophils Relative %: 52.8 % (ref 43.0–77.0)
Platelets: 357 10*3/uL (ref 150.0–400.0)
RBC: 3.97 Mil/uL (ref 3.87–5.11)
RDW: 12.8 % (ref 11.5–15.5)
WBC: 10.2 10*3/uL (ref 4.0–10.5)

## 2021-06-23 MED ORDER — AEROCHAMBER MV MISC: 0 refills | Status: AC

## 2021-06-23 MED ORDER — ADVAIR HFA 45-21 MCG/ACT IN AERO: 2.0000 | INHALATION_SPRAY | Freq: Two times a day (BID) | RESPIRATORY_TRACT | 12 refills | Status: DC

## 2021-06-23 NOTE — Patient Instructions (Addendum)
-   Let's try advair HFA 2 puffs twice daily WITH spacer - rinse/gargle mouth after each use, rinse out spacer too - CT Chest - allergy referral placed today

## 2021-06-23 NOTE — Addendum Note (Signed)
Addended by: Suzzanne Cloud E on: 06/23/2021 02:46 PM   Modules accepted: Orders

## 2021-06-24 LAB — IGE: IgE (Immunoglobulin E), Serum: 368 kU/L — ABNORMAL HIGH (ref <=114)

## 2021-07-01 ENCOUNTER — Other Ambulatory Visit: Payer: Self-pay | Admitting: Family Medicine

## 2021-07-02 ENCOUNTER — Ambulatory Visit: Payer: Medicare Other

## 2021-07-08 ENCOUNTER — Other Ambulatory Visit: Payer: Self-pay

## 2021-07-08 ENCOUNTER — Ambulatory Visit
Admission: RE | Admit: 2021-07-08 | Discharge: 2021-07-08 | Disposition: A | Discharge status: Home / Self Care | Payer: Medicare Other | Source: Ambulatory Visit | Attending: Family Medicine | Admitting: Family Medicine

## 2021-07-08 DIAGNOSIS — Z1231 Encounter for screening mammogram for malignant neoplasm of breast: Secondary | ICD-10-CM | POA: Diagnosis not present

## 2021-07-09 ENCOUNTER — Ambulatory Visit: Payer: Medicare Other | Admitting: Allergy

## 2021-07-09 ENCOUNTER — Encounter: Payer: Self-pay | Admitting: Allergy

## 2021-07-09 VITALS — BP 128/82 | HR 60 | Temp 98.6°F | Resp 18 | Ht 63.0 in | Wt 137.0 lb

## 2021-07-09 DIAGNOSIS — T781XXD Other adverse food reactions, not elsewhere classified, subsequent encounter: Secondary | ICD-10-CM | POA: Diagnosis not present

## 2021-07-09 DIAGNOSIS — J4540 Moderate persistent asthma, uncomplicated: Secondary | ICD-10-CM

## 2021-07-09 DIAGNOSIS — J449 Chronic obstructive pulmonary disease, unspecified: Secondary | ICD-10-CM | POA: Diagnosis not present

## 2021-07-09 DIAGNOSIS — T63481A Toxic effect of venom of other arthropod, accidental (unintentional), initial encounter: Secondary | ICD-10-CM

## 2021-07-09 MED ORDER — EPINEPHRINE 0.3 MG/0.3ML IJ SOAJ: 0.3000 mg | Freq: Once | INTRAMUSCULAR | 1 refills | Status: AC

## 2021-07-09 NOTE — Patient Instructions (Addendum)
-  environmental allergy testing is positive to dust mite -allergen avoidance measures discussed/handouts provided -will also obtain environmental allergy panel to ensure you do not have IgE (allergy signal) to dog or other allergens.  If starting immunotherapy will be able to use skin and blood testing results -allergen immunotherapy discussed today including protocol, benefits and risk.  Informational handout provided.  If interested in this therapuetic option you can check with your insurance carrier for coverage.  Let us know if you would like to proceed with this option.    -continue avoidance of cashew and stinging insects -skin testing to cashew is negative thus will obtain in blood work -have access to self-injectable epinephrine (Epipen or AuviQ) 0.29m at all times -follow emergency action plan in case of allergic reaction  -continue your follow-up care with your pulmonologist, Dr. MVerlee Monte-continue Advair HFA 2 puffs twice a day with spacer device -have access to albuterol inhaler 2 puffs every 4-6 hours as needed for cough/wheeze/shortness of breath/chest tightness.  May use 15-20 minutes prior to activity.   Monitor frequency of use.     Follow-up in 3-4 months or sooner if needed

## 2021-07-09 NOTE — Progress Notes (Signed)
New Patient Note  RE: [name] MRN: [id number] DOB: 04-20-1961 Date of Office Visit: 07/09/2021  Referring provider: Maryjane Hurter, MD Primary care provider: Susy Frizzle, MD  Chief Complaint: Allergy testing  History of present illness: [name] is a 60 y.o. female presenting today for evaluation of allergy testing she has COPD and follows with Dr. Verlee Monte in pulmonology.  She has expressed to him that she feels her dog causes wheezing.  Thus he is wanting her to have allergy testing.  She adopted a dog about 1.5 years ago.  She states her husband really loves the dog thus they do not plan to rehome the dog.  She feels better when she leaves the home.  She states the responsibility of cleaning and care of the dog is with both her and her husband but the dog has not been washed regularly.  She also states sometimes wines can flare up her breathing; she has not tried sulfite free wines.   She is currently on Advair HFA 2 puffs twice a day with spacer device.  She states after she uses inhalers for a while she starts to get irritation of her throat and hoarseness.  She states she has seen ENT in the past during a time where she did have hoarseness.  She states this evaluation was normal and did not reveal any evidence of thrush. She states with the triple therapy inhalers she developed difficulty with urination thus these were stopped.  It is intended that if she does have environmental allergies to sting's that she might benefit from allergen immunotherapy.  She states she did do allergy shots as a child and states it was helpful then.    She states currently she does have not have symptoms of rhinitis or conjunctivitis.  She does avoid cashews and avoids chinese buffets due to it.  She reports cashews makes her throat itch.    She states she had a venom allergy as a child due to honeybee sting and states she had trouble breathing and her airway closed and she had to  have "tube down my throat".   She does not have an epipen device currently.    She states it has been years since she has issues with eczema.    She states she is feeling really good today and her breathing is doing well and she has not had any wheezing today.  Review of systems: Review of Systems  Constitutional: Negative.   HENT: Negative.    Eyes: Negative.   Respiratory:  Positive for wheezing.        See HPI  Cardiovascular: Negative.   Gastrointestinal: Negative.   Musculoskeletal: Negative.   Skin: Negative.   Neurological: Negative.    All other systems negative unless noted above in HPI  Past medical history: Past Medical History:  Diagnosis Date   Anxiety    Aortic atherosclerosis ([place])    Arthritis    Asthma    Bed bug bite    Blood transfusion without reported diagnosis    CA - skin cancer    Chronic pain due to injury RIGHT HAND/WRIST IN 8938   Complication of anesthesia    unable to urinate after surgery   COPD (chronic obstructive pulmonary disease) (H[facility])    Stage 1   Crush injury of hand RIGHT IN 2007   X 6 SURG'S  LAST ONE BEING TOTAL WRIST FUSION W/ BONE GRAFT AND PLATE   Cubital tunnel syndrome  Left elbow   Depression    ETOH abuse    GERD (gastroesophageal reflux disease)    05/11/17- not recently   Hemorrhoids    History of kidney stones    passed   MRSA (methicillin resistant Staphylococcus aureus) carrier    Neuromuscular disorder ([place])    some fibromyalgia   Neuropathy    Osteopenia 07/2019   T score -1.9 FRAX 8.2% / 1.2%   PTSD (post-traumatic stress disorder) 2007 RIGHT HAND CRUSH INJURY W/ 2 FINGER AMPUTATION   Stroke ([place]) X2 CVA'S  IN 1990   LOSS OF VISION RIGHT Peripherial bilteral   Substance abuse ([place])    hx: narcotic use   UC (ulcerative colitis) ([facility])    Weakness of right leg CAUSED BY RETRIEVAL THIGH MUSCLE FOR FLAP RIGHT HAND INJURY-- 2006   RIGHT LEG GIVES OUT OCCASIONALLY    Past surgical history: Past Surgical  History:  Procedure Laterality Date   AMPUTATION Left 05/13/2017   Procedure: Left small finger revision amputation ;  Surgeon: Roseanne Kaufman, MD;  Location: [facility];  Service: Orthopedics;  Laterality: Left;  60 mins   CARPAL TUNNEL RELEASE Left 12/07/2020   Procedure: LEFT CARPAL TUNNEL RELEASE;  Surgeon: [name], MD;  Location: [facility];  Service: Orthopedics;  Laterality: Left;   COLON SURGERY     15 years ago- fistula on colon removed   COSMETIC SURGERY     right leg and abdomen   DILATATION & CURETTAGE/HYSTEROSCOPY WITH MYOSURE N/A 06/14/2016   Procedure: DILATATION & CURETTAGE/HYSTEROSCOPY WITH MYOSURE;  Surgeon: Anastasio Auerbach, MD;  Location: [facility];  Service: Gynecology;  Laterality: N/A;   HAND CARPECTOMY  12/24/2005   RIGHT-- INCLUDING  REMOVAL  WRIST WIRES AND I &D (EXICIOSNAL)  FOR OSTEOMYELITIS   HYSTEROSCOPY WITH D & C  08/18/2011   Procedure: DILATATION AND CURETTAGE (D&C) /HYSTEROSCOPY;  Surgeon: Anastasio Auerbach, MD;  Location: [facility];  Service: Gynecology;  Laterality: N/A;   INCISION / DRAINAGE HAND / FINGER  X3  (2006 & 2007)   RIGHT CRUSH WOUND   LESION REMOVAL  08/18/2011   Procedure: EXCISION VAGINAL LESION;  Surgeon: Anastasio Auerbach, MD;  Location: [facility];  Service: Gynecology;  Laterality: N/A;   ORIF CARPAL BONE FRACTURE  08/31/2005   I & D OF RIGHT HAND CRUSH INJURY/ OPEN FX'S AND INDEX FINGER RAY RESECTION/ COMPLICATED CLOSURE   ORIF RADIAL FRACTURE  09/04/2005   DISTAL RADIAL PLATE AND PARTIAL THUMB AMPUTATION   RIGHT TOTAL WRIST FUSION W/ BONE GRAFT AND PLATE  67/89/3810   AND FLAP USING RIGHT GOIN MUSCLE   skin cancer removal  2007   throat area   TUBAL LIGATION  72 YRS AGO    Family history:  Family History  Problem Relation Age of Onset   Lupus Mother        died at 69   Breast cancer Maternal Aunt 35   Breast cancer Maternal Grandmother 60   Stomach cancer Paternal  Grandfather    Colon cancer Neg Hx    Rectal cancer Neg Hx    Esophageal cancer Neg Hx    Inflammatory bowel disease Neg Hx    Liver disease Neg Hx    Pancreatic cancer Neg Hx     Social history: Lives in a dwelling that is not a house, condo, apartment or townhome.  No carpeting in the dwelling.  There is gas heating and central cooling.  Dog in the home.  There is no concern for roaches in the home.  She does not report an occupation at this time.  She does not report smoking history on the environmental form   Medication List: Current Outpatient Medications  Medication Sig Dispense Refill   albuterol (VENTOLIN HFA) 108 (90 Base) MCG/ACT inhaler TAKE 2 PUFFS BY MOUTH EVERY 6 HOURS AS NEEDED FOR WHEEZE OR SHORTNESS OF BREATH 8.5 each 2   EPINEPHrine (EPIPEN 2-PAK) 0.3 mg/0.3 mL IJ SOAJ injection Inject 0.3 mg into the muscle once for 1 dose. 0.3 mL 1   fluticasone-salmeterol (ADVAIR HFA) 45-21 MCG/ACT inhaler Inhale 2 puffs into the lungs 2 (two) times daily. 1 each 12   gabapentin (NEURONTIN) 300 MG capsule TAKE 1 CAPSULE (300 MG TOTAL) BY MOUTH 3 (THREE) TIMES DAILY WITH MEALS AS NEEDED (NERVE PAIN). 90 capsule 3   Multiple Vitamin (MULTIVITAMIN WITH MINERALS) TABS tablet Take 1 tablet by mouth daily.     pantoprazole (PROTONIX) 40 MG tablet Take 1 tablet (40 mg total) by mouth daily. (Patient taking differently: Take 40 mg by mouth 2 (two) times daily.) 90 tablet 1   Spacer/Aero-Holding Chambers (AEROCHAMBER MV) inhaler Use as instructed 1 each 0   No current facility-administered medications for this visit.    Known medication allergies: Allergies  Allergen Reactions   Aspirin Shortness Of Breath    ASTHMATIC ATTACK   Breztri Aerosphere [Budeson-Glycopyrrol-Formoterol] Other (See Comments)    Urinary retention   Codeine Other (See Comments)    ABNORMAL BEHAVIOR  Increased in bad moods  NO CODEINE BASED MEDICATIONS    Vicodin [Hydrocodone-Acetaminophen] Other (See Comments)     Extreme anxiety     Physical examination: Blood pressure 128/82, pulse 60, temperature 98.6 F (37 C), temperature source Temporal, resp. rate 18, height 5' 3"  (1.6 m), weight 137 lb (62.1 kg), SpO2 98 %.  General: Alert, interactive, in no acute distress. HEENT: PERRLA, TMs pearly gray, turbinates non-edematous without discharge, post-pharynx non erythematous. Neck: Supple without lymphadenopathy. Lungs: Clear to auscultation without wheezing, rhonchi or rales. {no increased work of breathing. CV: Normal S1, S2 without murmurs. Abdomen: Nondistended, nontender. Skin: Warm and dry, without lesions or rashes. Extremities:  No clubbing, cyanosis or edema. Neuro:   Grossly intact.  Diagnositics/Labs: Spirometry: FEV1 1.38 L 58%, FVC 2.53 L 85% predicted.  Consistent with obstructive pattern  Allergy testing: Environmental allergy skin prick testing is positive to dust mite DF. Skin prick to cashew was negative Allergy testing results were read and interpreted by provider, documented by clinical staff.   Assessment and plan: COPD with reactive airway component  -continue your follow-up care with your pulmonologist, Dr. Verlee Monte -continue Advair HFA 2 puffs twice a day with spacer device -have access to albuterol inhaler 2 puffs every 4-6 hours as needed for cough/wheeze/shortness of breath/chest tightness.  May use 15-20 minutes prior to activity.   Monitor frequency of use.   -environmental allergy testing is positive to dust mite -allergen avoidance measures discussed/handouts provided -will also obtain environmental allergy panel to ensure you do not have IgE (allergy signal) to dog or other allergens.  If starting immunotherapy will be able to use skin and blood testing results -allergen immunotherapy discussed today including protocol, benefits and risk.  Informational handout provided.  If interested in this therapuetic option you can check with your insurance carrier for  coverage.  Let us know if you would like to proceed with this option.    Food allergy Hymenoptera  allergy -continue avoidance of cashew and stinging insects -skin testing to cashew is negative thus will obtain in blood work -have access to self-injectable epinephrine (Epipen or AuviQ) 0.24m at all times -follow emergency action plan in case of allergic reaction  Follow-up in 3-4 months or sooner if needed I appreciate the opportunity to take part in Shatori's care. Please do not hesitate to contact me with questions.  Sincerely,   SPrudy Feeler MD Allergy/Immunology [facility]

## 2021-07-12 ENCOUNTER — Other Ambulatory Visit: Payer: Self-pay

## 2021-07-12 ENCOUNTER — Ambulatory Visit (INDEPENDENT_AMBULATORY_CARE_PROVIDER_SITE_OTHER)
Admission: RE | Admit: 2021-07-12 | Discharge: 2021-07-12 | Disposition: A | Discharge status: Home / Self Care | Payer: Medicare Other | Source: Ambulatory Visit | Attending: Student | Admitting: Student

## 2021-07-12 DIAGNOSIS — J439 Emphysema, unspecified: Secondary | ICD-10-CM | POA: Diagnosis not present

## 2021-07-12 DIAGNOSIS — R9389 Abnormal findings on diagnostic imaging of other specified body structures: Secondary | ICD-10-CM

## 2021-07-12 DIAGNOSIS — R053 Chronic cough: Secondary | ICD-10-CM | POA: Diagnosis not present

## 2021-07-12 DIAGNOSIS — I700 Atherosclerosis of aorta: Secondary | ICD-10-CM | POA: Diagnosis not present

## 2021-07-12 DIAGNOSIS — R059 Cough, unspecified: Secondary | ICD-10-CM

## 2021-07-16 LAB — ALLERGENS W/COMP RFLX AREA 2
Alternaria Alternata IgE: <0.1 kU/L
Aspergillus Fumigatus IgE: 0.3 kU/L — AB
Bermuda Grass IgE: <0.1 kU/L
Cedar, Mountain IgE: 0.5 kU/L — AB
Cladosporium Herbarum IgE: <0.1 kU/L
Cockroach, German IgE: 0.28 kU/L — AB
Common Silver Birch IgE: <0.1 kU/L
Cottonwood IgE: <0.1 kU/L
D Farinae IgE: <0.1 kU/L
D Pteronyssinus IgE: <0.1 kU/L
E001-IgE Cat Dander: <0.1 kU/L
E005-IgE Dog Dander: <0.1 kU/L
Elm, American IgE: 0.1 kU/L — AB
IgE (Immunoglobulin E), Serum: 335 IU/mL (ref 6–495)
Johnson Grass IgE: 1.16 kU/L — AB
Maple/Box Elder IgE: 0.13 kU/L — AB
Mouse Urine IgE: <0.1 kU/L
Oak, White IgE: <0.1 kU/L
Pecan, Hickory IgE: <0.1 kU/L
Penicillium Chrysogen IgE: 0.14 kU/L — AB
Pigweed, Rough IgE: <0.1 kU/L
Ragweed, Short IgE: <0.1 kU/L
Sheep Sorrel IgE Qn: <0.1 kU/L
Timothy Grass IgE: 1.08 kU/L — AB
White Mulberry IgE: <0.1 kU/L

## 2021-07-16 LAB — ALLERGEN CASHEW: F202-IgE Cashew Nut: 0.49 kU/L — AB

## 2021-08-03 ENCOUNTER — Other Ambulatory Visit: Payer: Self-pay | Admitting: Family Medicine

## 2021-08-17 ENCOUNTER — Other Ambulatory Visit: Payer: Self-pay

## 2021-08-17 MED ORDER — ALBUTEROL SULFATE HFA 108 (90 BASE) MCG/ACT IN AERS: INHALATION_SPRAY | RESPIRATORY_TRACT | 2 refills | Status: DC

## 2021-08-26 DIAGNOSIS — D485 Neoplasm of uncertain behavior of skin: Secondary | ICD-10-CM | POA: Diagnosis not present

## 2021-08-26 DIAGNOSIS — L720 Epidermal cyst: Secondary | ICD-10-CM | POA: Diagnosis not present

## 2021-08-26 DIAGNOSIS — L570 Actinic keratosis: Secondary | ICD-10-CM | POA: Diagnosis not present

## 2021-08-26 DIAGNOSIS — I788 Other diseases of capillaries: Secondary | ICD-10-CM | POA: Diagnosis not present

## 2021-08-26 DIAGNOSIS — L821 Other seborrheic keratosis: Secondary | ICD-10-CM | POA: Diagnosis not present

## 2021-08-26 DIAGNOSIS — Z85828 Personal history of other malignant neoplasm of skin: Secondary | ICD-10-CM | POA: Diagnosis not present

## 2021-08-27 ENCOUNTER — Ambulatory Visit: Payer: Medicare Other | Admitting: Allergy

## 2021-09-23 ENCOUNTER — Telehealth: Payer: Self-pay | Admitting: Family Medicine

## 2021-09-23 NOTE — Telephone Encounter (Signed)
Left message for patient to call back and schedule Medicare Annual Wellness Visit (AWV) in office.  ° °If not able to come in office, please offer to do virtually or by telephone.  Left office number and my jabber #[phone]. ° °Due for AWVI ° °Please schedule at anytime with Nurse Health Advisor. °  °

## 2021-10-04 NOTE — Progress Notes (Signed)
Synopsis: Referred for COPD by Susy Frizzle, MD  Subjective:   PATIENT ID: [name] GENDER: female DOB: 1961-06-12, MRN: [id number]  Chief Complaint  Patient presents with   Follow-up    Breathing is doing great and she has not had to use her albuterol inhaler. She is using her advair hfa 1 puff bid.    60yF with COPD on serevent (urinary retention with LAMA, 'side effects' with ICS), asthma, UC, GERD, 42 py smoker quit 2003  Right now the only inhaler she's using is albuterol. She says she used serevent for some time but stopped due to an adverse effect that she can't recall.   Her PCP prescribed incruse and she tried it for 3 months but then got some throat irritation.   With trelegy she had urinary retention.   Hoarseness with ICS limits her ability to use any inhaler with ICS.   She wonders if her wheezing is driven more by allergy. She does have a new dog and wonders if her recent increased wheezing is related.  She just finished a course of steroids for dyspnea attributed to COPD. She emphasizes that her throat is where she feels tightness, wheeze. She saw an ENT a long time ago.   She denies sinonasal congestion, PND but does have GERD that requires twice daily ppi. Smokes MJ now only with water bong.  Interval HPI:  Last seen by me 06/23/21 at which point started advair, referred to allergy. CT given some CXR suggestive of bronchiectasis. No bronchiectasis but there was diffuse subtle micronodularity.  She is using advair hfa inhaler 1 puff twice daily with spacer. Rinses mouth after using advair. Has not needed any courses of steroids since last visit. Apart from morning productive cough not bothered at all by cough. No DOE.   Otherwise pertinent review of systems is negative.    Past Medical History:  Diagnosis Date   Anxiety    Aortic atherosclerosis ([place])    Arthritis    Asthma    Bed bug bite    Blood transfusion without reported diagnosis     CA - skin cancer    Chronic pain due to injury RIGHT HAND/WRIST IN 9622   Complication of anesthesia    unable to urinate after surgery   COPD (chronic obstructive pulmonary disease) ([place])    Stage 1   Crush injury of hand RIGHT IN 2007   X 6 SURG'S  LAST ONE BEING TOTAL WRIST FUSION W/ BONE GRAFT AND PLATE   Cubital tunnel syndrome    Left elbow   Depression    ETOH abuse    GERD (gastroesophageal reflux disease)    05/11/17- not recently   Hemorrhoids    History of kidney stones    passed   MRSA (methicillin resistant Staphylococcus aureus) carrier    Neuromuscular disorder ([place])    some fibromyalgia   Neuropathy    Osteopenia 07/2019   T score -1.9 FRAX 8.2% / 1.2%   PTSD (post-traumatic stress disorder) 2007 RIGHT HAND CRUSH INJURY W/ 2 FINGER AMPUTATION   Stroke ([place]) X2 CVA'S  IN 1990   LOSS OF VISION RIGHT Peripherial bilteral   Substance abuse ([place])    hx: narcotic use   UC (ulcerative colitis) ([facility])    Weakness of right leg CAUSED BY RETRIEVAL THIGH MUSCLE FOR FLAP RIGHT HAND INJURY-- 2006   RIGHT LEG GIVES OUT OCCASIONALLY     Family History  Problem Relation Age of Onset  Lupus Mother        died at 23   Breast cancer Maternal Aunt 35   Breast cancer Maternal Grandmother 42   Stomach cancer Paternal Grandfather    Colon cancer Neg Hx    Rectal cancer Neg Hx    Esophageal cancer Neg Hx    Inflammatory bowel disease Neg Hx    Liver disease Neg Hx    Pancreatic cancer Neg Hx      Past Surgical History:  Procedure Laterality Date   AMPUTATION Left 05/13/2017   Procedure: Left small finger revision amputation ;  Surgeon: Roseanne Kaufman, MD;  Location: [facility];  Service: Orthopedics;  Laterality: Left;  60 mins   CARPAL TUNNEL RELEASE Left 12/07/2020   Procedure: LEFT CARPAL TUNNEL RELEASE;  Surgeon: [name], MD;  Location: [facility];  Service: Orthopedics;  Laterality: Left;   COLON SURGERY     15 years ago- fistula on colon removed    COSMETIC SURGERY     right leg and abdomen   DILATATION & CURETTAGE/HYSTEROSCOPY WITH MYOSURE N/A 06/14/2016   Procedure: DILATATION & CURETTAGE/HYSTEROSCOPY WITH MYOSURE;  Surgeon: Anastasio Auerbach, MD;  Location: [facility];  Service: Gynecology;  Laterality: N/A;   HAND CARPECTOMY  12/24/2005   RIGHT-- INCLUDING  REMOVAL  WRIST WIRES AND I &D (EXICIOSNAL)  FOR OSTEOMYELITIS   HYSTEROSCOPY WITH D & C  08/18/2011   Procedure: DILATATION AND CURETTAGE (D&C) /HYSTEROSCOPY;  Surgeon: Anastasio Auerbach, MD;  Location: [facility];  Service: Gynecology;  Laterality: N/A;   INCISION / DRAINAGE HAND / FINGER  X3  (2006 & 2007)   RIGHT CRUSH WOUND   LESION REMOVAL  08/18/2011   Procedure: EXCISION VAGINAL LESION;  Surgeon: Anastasio Auerbach, MD;  Location: [facility];  Service: Gynecology;  Laterality: N/A;   ORIF CARPAL BONE FRACTURE  08/31/2005   I & D OF RIGHT HAND CRUSH INJURY/ OPEN FX'S AND INDEX FINGER RAY RESECTION/ COMPLICATED CLOSURE   ORIF RADIAL FRACTURE  09/04/2005   DISTAL RADIAL PLATE AND PARTIAL THUMB AMPUTATION   RIGHT TOTAL WRIST FUSION W/ BONE GRAFT AND PLATE  88/41/6606   AND FLAP USING RIGHT GOIN MUSCLE   skin cancer removal  2007   throat area   TUBAL LIGATION  15 YRS AGO    Social History   Socioeconomic History   Marital status: Married    Spouse name: Not on file   Number of children: 6   Years of education: Not on file   Highest education level: Not on file  Occupational History   Occupation: Housekeeper   Tobacco Use   Smoking status: Former    Packs/day: 1.50    Years: 28.00    Pack years: 42.00    Types: Cigarettes    Quit date: 11/29/2001    Years since quitting: 19.8   Smokeless tobacco: Never  Vaping Use   Vaping Use: Never used  Substance and Sexual Activity   Alcohol use: Yes    Comment: wine daily   Drug use: Yes    Types: Marijuana    Comment: last smoked 12-08-20   Sexual activity: Yes    Partners: Male     Birth control/protection: Post-menopausal    Comment: 1st intercourse 61 yo-More than 5 partners  Other Topics Concern   Not on file  Social History Narrative   Lives with husband.     Social Determinants of Radio broadcast assistant  Strain: Not on file  Food Insecurity: Not on file  Transportation Needs: Not on file  Physical Activity: Not on file  Stress: Not on file  Social Connections: Not on file  Intimate Partner Violence: Not on file     Allergies  Allergen Reactions   Aspirin Shortness Of Breath    ASTHMATIC ATTACK   Breztri Aerosphere [Budeson-Glycopyrrol-Formoterol] Other (See Comments)    Urinary retention   Codeine Other (See Comments)    ABNORMAL BEHAVIOR  Increased in bad moods  NO CODEINE BASED MEDICATIONS    Vicodin [Hydrocodone-Acetaminophen] Other (See Comments)    Extreme anxiety     Outpatient Medications Prior to Visit  Medication Sig Dispense Refill   albuterol (VENTOLIN HFA) 108 (90 Base) MCG/ACT inhaler TAKE 2 PUFFS BY MOUTH EVERY 6 HOURS AS NEEDED FOR WHEEZE OR SHORTNESS OF BREATH 8.5 each 2   fluticasone-salmeterol (ADVAIR HFA) 45-21 MCG/ACT inhaler Inhale 2 puffs into the lungs 2 (two) times daily. (Patient taking differently: Inhale 1 puff into the lungs 2 (two) times daily.) 1 each 12   gabapentin (NEURONTIN) 300 MG capsule TAKE 1 CAPSULE (300 MG TOTAL) BY MOUTH 3 (THREE) TIMES DAILY WITH MEALS AS NEEDED (NERVE PAIN). 90 capsule 3   Multiple Vitamin (MULTIVITAMIN WITH MINERALS) TABS tablet Take 1 tablet by mouth daily.     pantoprazole (PROTONIX) 40 MG tablet TAKE 1 TABLET BY MOUTH EVERY DAY (Patient taking differently: 40 mg 2 (two) times daily.) 90 tablet 1   Spacer/Aero-Holding Chambers (AEROCHAMBER MV) inhaler Use as instructed 1 each 0   No facility-administered medications prior to visit.       Objective:   Physical Exam:  General appearance: 61 y.o., female, NAD, conversant  Eyes: anicteric sclerae; PERRL, tracking  appropriately HENT: NCAT; MMM Neck: Trachea midline; no lymphadenopathy, no JVD Lungs: CTAB, no crackles, no wheeze, with normal respiratory effort CV: RRR, no murmur  Abdomen: Soft, non-tender; non-distended, BS present  Extremities: No peripheral edema, warm Skin: Normal turgor and texture; no rash Psych: Appropriate affect Neuro: Alert and oriented to person and place, no focal deficit     Vitals:   10/05/21 1131  BP: 122/80  Pulse: 71  Temp: 98.3 F (36.8 C)  TempSrc: Oral  SpO2: 96%  Weight: 139 lb (63 kg)  Height: 5\' 2"  (1.575 m)    96% on RA BMI Readings from Last 3 Encounters:  10/05/21 25.42 kg/m  07/09/21 24.27 kg/m  06/23/21 24.34 kg/m   Wt Readings from Last 3 Encounters:  10/05/21 139 lb (63 kg)  07/09/21 137 lb (62.1 kg)  06/23/21 137 lb 6.4 oz (62.3 kg)     CBC    Component Value Date/Time   WBC 10.2 06/23/2021 1446   RBC 3.97 06/23/2021 1446   HGB 12.7 06/23/2021 1446   HCT 38.6 06/23/2021 1446   PLT 357.0 06/23/2021 1446   MCV 97.2 06/23/2021 1446   MCH 32.5 12/04/2020 1130   MCHC 33.0 06/23/2021 1446   RDW 12.8 06/23/2021 1446   LYMPHSABS 3.5 06/23/2021 1446   MONOABS 0.8 06/23/2021 1446   EOSABS 0.4 06/23/2021 1446   BASOSABS 0.1 06/23/2021 1446    Eosinophils 400 IgE 368  Chest Imaging:  CXR reviewed by me with likely atelectasis/scar in lingula similar to that from CT A/P 06/11/20  CT Chest 07/12/21 reviewed by me without bronchiectasis. There is diffuse subtle micronodularity  Pulmonary Functions Testing Results: PFT Results Latest Ref Rng & Units 04/29/2019  FVC-Pre L 2.62  FVC-Predicted  Pre % 84  FVC-Post L 2.66  FVC-Predicted Post % 86  Pre FEV1/FVC % % 57  Post FEV1/FCV % % 57  FEV1-Pre L 1.49  FEV1-Predicted Pre % 62  FEV1-Post L 1.52  DLCO uncorrected ml/min/mmHg 18.45  DLCO [facility]% % 97  DLVA Predicted % 79  TLC L 6.67  TLC % Predicted % 140  RV % Predicted % 196   Moderate obstruction but with clinically  important improvement in FEV1 relative to PFTs 2018. Also has hyperinflation, air trapping, normal DLCO, normal flow volume loops. Neg BD response    Echocardiogram:   Negative ECG stress test 03/2021      Assessment & Plan:   # Asthma-COPD overlap: # Eosinophilic asthma # COPD gold functional group D: variety of intolerances to controller inhalers but she is doing great on advair HFA, following with allergy as well. ICS usually gets throat irritation despite rinsing mouth, LAMA gets urinary retention, LABA she is unsure but thinks she hasn't tolerated before. If later not responsive to LABA/ low dose ICS with spacer and still has throat tightness will consider referral to ENT for possible irritable larynx from GERD vs VCD.   Plan: - advair HFA 1 puff twice daily with spacer, rinse mouth afterward - prn albuterol - following with allergy   RTC 6 months  Maryjane Hurter, MD [facility] 10/05/2021 11:50 AM

## 2021-10-05 ENCOUNTER — Other Ambulatory Visit: Payer: Self-pay

## 2021-10-05 ENCOUNTER — Encounter: Payer: Self-pay | Admitting: Student

## 2021-10-05 ENCOUNTER — Ambulatory Visit: Payer: Medicare Other | Admitting: Student

## 2021-10-05 VITALS — BP 122/80 | HR 71 | Temp 98.3°F | Ht 62.0 in | Wt 139.0 lb

## 2021-10-05 DIAGNOSIS — J449 Chronic obstructive pulmonary disease, unspecified: Secondary | ICD-10-CM

## 2021-10-05 NOTE — Patient Instructions (Signed)
-   see you in 6 months to check in - no change!

## 2021-10-07 ENCOUNTER — Other Ambulatory Visit: Payer: Self-pay

## 2021-10-07 ENCOUNTER — Ambulatory Visit (INDEPENDENT_AMBULATORY_CARE_PROVIDER_SITE_OTHER): Payer: Medicare Other

## 2021-10-07 VITALS — Ht 62.0 in | Wt 139.0 lb

## 2021-10-07 DIAGNOSIS — Z5941 Food insecurity: Secondary | ICD-10-CM | POA: Diagnosis not present

## 2021-10-07 DIAGNOSIS — Z0000 Encounter for general adult medical examination without abnormal findings: Secondary | ICD-10-CM | POA: Diagnosis not present

## 2021-10-07 NOTE — Patient Instructions (Signed)
Ms. Noren , Thank you for taking time to come for your Medicare Wellness Visit. I appreciate your ongoing commitment to your health goals. Please review the following plan we discussed and let me know if I can assist you in the future.   Screening recommendations/referrals: Colonoscopy: Done 10/31/2012 Repeat in 10 years  Mammogram: Done 07/05/2021 Repeat annually  Bone Density: Due at age 61.  Recommended yearly ophthalmology/optometry visit for glaucoma screening and checkup Recommended yearly dental visit for hygiene and checkup  Vaccinations: Influenza vaccine: Done 06/23/2021 Repeat annually  Pneumococcal vaccine: Done 05/13/2017 and 09/29/2017 Tdap vaccine: Done 08/20/2013 Repeat in 10 years  Shingles vaccine: Shingrix discussed. Please contact your pharmacy for coverage information.    Covid-19: Done 02/05/2020  Advanced directives: Advance directive discussed with you today. Even though you declined this today, please call our office should you change your mind, and we can give you the proper paperwork for you to fill out.   Conditions/risks identified: Aim for 30 minutes of exercise or brisk walking each day, drink 6-8 glasses of water and eat lots of fruits and vegetables.   Next appointment: Follow up in one year for your annual wellness visit. 2024.  Preventive Care 40-64 Years, Female Preventive care refers to lifestyle choices and visits with your health care provider that can promote health and wellness. What does preventive care include? A yearly physical exam. This is also called an annual well check. Dental exams once or twice a year. Routine eye exams. Ask your health care provider how often you should have your eyes checked. Personal lifestyle choices, including: Daily care of your teeth and gums. Regular physical activity. Eating a healthy diet. Avoiding tobacco and drug use. Limiting alcohol use. Practicing safe sex. Taking low-dose aspirin daily starting  at age 47. Taking vitamin and mineral supplements as recommended by your health care provider. What happens during an annual well check? The services and screenings done by your health care provider during your annual well check will depend on your age, overall health, lifestyle risk factors, and family history of disease. Counseling  Your health care provider may ask you questions about your: Alcohol use. Tobacco use. Drug use. Emotional well-being. Home and relationship well-being. Sexual activity. Eating habits. Work and work Statistician. Method of birth control. Menstrual cycle. Pregnancy history. Screening  You may have the following tests or measurements: Height, weight, and BMI. Blood pressure. Lipid and cholesterol levels. These may be checked every 5 years, or more frequently if you are over 76 years old. Skin check. Lung cancer screening. You may have this screening every year starting at age 63 if you have a 30-pack-year history of smoking and currently smoke or have quit within the past 15 years. Fecal occult blood test (FOBT) of the stool. You may have this test every year starting at age 25. Flexible sigmoidoscopy or colonoscopy. You may have a sigmoidoscopy every 5 years or a colonoscopy every 10 years starting at age 64. Hepatitis C blood test. Hepatitis B blood test. Sexually transmitted disease (STD) testing. Diabetes screening. This is done by checking your blood sugar (glucose) after you have not eaten for a while (fasting). You may have this done every 1-3 years. Mammogram. This may be done every 1-2 years. Talk to your health care provider about when you should start having regular mammograms. This may depend on whether you have a family history of breast cancer. BRCA-related cancer screening. This may be done if you have a family history of  breast, ovarian, tubal, or peritoneal cancers. Pelvic exam and Pap test. This may be done every 3 years starting at age 91.  Starting at age 84, this may be done every 5 years if you have a Pap test in combination with an HPV test. Bone density scan. This is done to screen for osteoporosis. You may have this scan if you are at high risk for osteoporosis. Discuss your test results, treatment options, and if necessary, the need for more tests with your health care provider. Vaccines  Your health care provider may recommend certain vaccines, such as: Influenza vaccine. This is recommended every year. Tetanus, diphtheria, and acellular pertussis (Tdap, Td) vaccine. You may need a Td booster every 10 years. Zoster vaccine. You may need this after age 9. Pneumococcal 13-valent conjugate (PCV13) vaccine. You may need this if you have certain conditions and were not previously vaccinated. Pneumococcal polysaccharide (PPSV23) vaccine. You may need one or two doses if you smoke cigarettes or if you have certain conditions. Talk to your health care provider about which screenings and vaccines you need and how often you need them. This information is not intended to replace advice given to you by your health care provider. Make sure you discuss any questions you have with your health care provider. Document Released: 09/25/2015 Document Revised: 05/18/2016 Document Reviewed: 06/30/2015 Elsevier Interactive Patient Education  2017 [place] Prevention in the Home Falls can cause injuries. They can happen to people of all ages. There are many things you can do to make your home safe and to help prevent falls. What can I do on the outside of my home? Regularly fix the edges of walkways and driveways and fix any cracks. Remove anything that might make you trip as you walk through a door, such as a raised step or threshold. Trim any bushes or trees on the path to your home. Use bright outdoor lighting. Clear any walking paths of anything that might make someone trip, such as rocks or tools. Regularly check to see if  handrails are loose or broken. Make sure that both sides of any steps have handrails. Any raised decks and porches should have guardrails on the edges. Have any leaves, snow, or ice cleared regularly. Use sand or salt on walking paths during winter. Clean up any spills in your garage right away. This includes oil or grease spills. What can I do in the bathroom? Use night lights. Install grab bars by the toilet and in the tub and shower. Do not use towel bars as grab bars. Use non-skid mats or decals in the tub or shower. If you need to sit down in the shower, use a plastic, non-slip stool. Keep the floor dry. Clean up any water that spills on the floor as soon as it happens. Remove soap buildup in the tub or shower regularly. Attach bath mats securely with double-sided non-slip rug tape. Do not have throw rugs and other things on the floor that can make you trip. What can I do in the bedroom? Use night lights. Make sure that you have a light by your bed that is easy to reach. Do not use any sheets or blankets that are too big for your bed. They should not hang down onto the floor. Have a firm chair that has side arms. You can use this for support while you get dressed. Do not have throw rugs and other things on the floor that can make you trip. What  can I do in the kitchen? Clean up any spills right away. Avoid walking on wet floors. Keep items that you use a lot in easy-to-reach places. If you need to reach something above you, use a strong step stool that has a grab bar. Keep electrical cords out of the way. Do not use floor polish or wax that makes floors slippery. If you must use wax, use non-skid floor wax. Do not have throw rugs and other things on the floor that can make you trip. What can I do with my stairs? Do not leave any items on the stairs. Make sure that there are handrails on both sides of the stairs and use them. Fix handrails that are broken or loose. Make sure that  handrails are as long as the stairways. Check any carpeting to make sure that it is firmly attached to the stairs. Fix any carpet that is loose or worn. Avoid having throw rugs at the top or bottom of the stairs. If you do have throw rugs, attach them to the floor with carpet tape. Make sure that you have a light switch at the top of the stairs and the bottom of the stairs. If you do not have them, ask someone to add them for you. What else can I do to help prevent falls? Wear shoes that: Do not have high heels. Have rubber bottoms. Are comfortable and fit you well. Are closed at the toe. Do not wear sandals. If you use a stepladder: Make sure that it is fully opened. Do not climb a closed stepladder. Make sure that both sides of the stepladder are locked into place. Ask someone to hold it for you, if possible. Clearly mark and make sure that you can see: Any grab bars or handrails. First and last steps. Where the edge of each step is. Use tools that help you move around (mobility aids) if they are needed. These include: Canes. Walkers. Scooters. Crutches. Turn on the lights when you go into a dark area. Replace any light bulbs as soon as they burn out. Set up your furniture so you have a clear path. Avoid moving your furniture around. If any of your floors are uneven, fix them. If there are any pets around you, be aware of where they are. Review your medicines with your doctor. Some medicines can make you feel dizzy. This can increase your chance of falling. Ask your doctor what other things that you can do to help prevent falls. This information is not intended to replace advice given to you by your health care provider. Make sure you discuss any questions you have with your health care provider. Document Released: 06/25/2009 Document Revised: 02/04/2016 Document Reviewed: 10/03/2014 Elsevier Interactive Patient Education  2017 [employer].

## 2021-10-07 NOTE — Progress Notes (Signed)
Subjective:   [name] is a 61 y.o. female who presents for an Initial Medicare Annual Wellness Visit. Virtual Visit via Telephone Note  I connected with  [name] on 10/07/21 at  3:30 PM EST by telephone and verified that I am speaking with the correct person using two identifiers.  Location: Patient: HOME Provider: [facility] Persons participating in the virtual visit: patient/Nurse Health Advisor   I discussed the limitations, risks, security and privacy concerns of performing an evaluation and management service by telephone and the availability of in person appointments. The patient expressed understanding and agreed to proceed.  Interactive audio and video telecommunications were attempted between this nurse and patient, however failed, due to patient having technical difficulties OR patient did not have access to video capability.  We continued and completed visit with audio only.  Some vital signs may be absent or patient reported.   Chriss Driver, LPN  Review of Systems     Cardiac Risk Factors include: sedentary lifestyle;Other (see comment);dyslipidemia, Risk factor comments: COPD, Spondylosis with rediculopathy cervical region.  PHONE VISIT. PT AT HOME. NURSE AT [facility].    Objective:    Today's Vitals   10/07/21 1533  Weight: 139 lb (63 kg)  Height: 5\' 2"  (1.575 m)   Body mass index is 25.42 kg/m.  Advanced Directives 10/07/2021 12/07/2020 12/01/2020 05/13/2017 06/17/2016 06/07/2016 11/23/2015  Does Patient Have a Medical Advance Directive? No No No Yes No No No  Type of Advance Directive - - - Living will - - -  Does patient want to make changes to medical advance directive? - - - No - Patient declined - - -  Would patient like information on creating a medical advance directive? No - Patient declined No - Patient declined - - No - patient declined information Yes - Educational materials given -    Current Medications (verified) Outpatient Encounter  Medications as of 10/07/2021  Medication Sig   albuterol (VENTOLIN HFA) 108 (90 Base) MCG/ACT inhaler TAKE 2 PUFFS BY MOUTH EVERY 6 HOURS AS NEEDED FOR WHEEZE OR SHORTNESS OF BREATH   EPINEPHrine 0.3 mg/0.3 mL IJ SOAJ injection Inject into the muscle.   fluticasone-salmeterol (ADVAIR HFA) 45-21 MCG/ACT inhaler Inhale 2 puffs into the lungs 2 (two) times daily. (Patient taking differently: Inhale 1 puff into the lungs 2 (two) times daily.)   gabapentin (NEURONTIN) 300 MG capsule TAKE 1 CAPSULE (300 MG TOTAL) BY MOUTH 3 (THREE) TIMES DAILY WITH MEALS AS NEEDED (NERVE PAIN).   Multiple Vitamin (MULTIVITAMIN WITH MINERALS) TABS tablet Take 1 tablet by mouth daily.   pantoprazole (PROTONIX) 40 MG tablet TAKE 1 TABLET BY MOUTH EVERY DAY (Patient taking differently: 40 mg 2 (two) times daily.)   Spacer/Aero-Holding Chambers (AEROCHAMBER MV) inhaler Use as instructed   No facility-administered encounter medications on file as of 10/07/2021.    Allergies (verified) Aspirin, Breztri aerosphere [budeson-glycopyrrol-formoterol], Codeine, and Vicodin [hydrocodone-acetaminophen]   History: Past Medical History:  Diagnosis Date   Anxiety    Aortic atherosclerosis ([place])    Arthritis    Asthma    Bed bug bite    Blood transfusion without reported diagnosis    CA - skin cancer    Chronic pain due to injury RIGHT HAND/WRIST IN 1610   Complication of anesthesia    unable to urinate after surgery   COPD (chronic obstructive pulmonary disease) ([place])    Stage 1   Crush injury of hand RIGHT IN 2007   X  6 SURG'S  LAST ONE BEING TOTAL WRIST FUSION W/ BONE GRAFT AND PLATE   Cubital tunnel syndrome    Left elbow   Depression    ETOH abuse    GERD (gastroesophageal reflux disease)    05/11/17- not recently   Hemorrhoids    History of kidney stones    passed   MRSA (methicillin resistant Staphylococcus aureus) carrier    Neuromuscular disorder ([place])    some fibromyalgia   Neuropathy    Osteopenia  07/2019   T score -1.9 FRAX 8.2% / 1.2%   PTSD (post-traumatic stress disorder) 2007 RIGHT HAND CRUSH INJURY W/ 2 FINGER AMPUTATION   Stroke ([place]) X2 CVA'S  IN 1990   LOSS OF VISION RIGHT Peripherial bilteral   Substance abuse ([place])    hx: narcotic use   UC (ulcerative colitis) ([facility])    Weakness of right leg CAUSED BY RETRIEVAL THIGH MUSCLE FOR FLAP RIGHT HAND INJURY-- 2006   RIGHT LEG GIVES OUT OCCASIONALLY   Past Surgical History:  Procedure Laterality Date   AMPUTATION Left 05/13/2017   Procedure: Left small finger revision amputation ;  Surgeon: Roseanne Kaufman, MD;  Location: [facility];  Service: Orthopedics;  Laterality: Left;  60 mins   CARPAL TUNNEL RELEASE Left 12/07/2020   Procedure: LEFT CARPAL TUNNEL RELEASE;  Surgeon: [name], MD;  Location: [facility];  Service: Orthopedics;  Laterality: Left;   COLON SURGERY     15 years ago- fistula on colon removed   COSMETIC SURGERY     right leg and abdomen   DILATATION & CURETTAGE/HYSTEROSCOPY WITH MYOSURE N/A 06/14/2016   Procedure: DILATATION & CURETTAGE/HYSTEROSCOPY WITH MYOSURE;  Surgeon: Anastasio Auerbach, MD;  Location: [facility];  Service: Gynecology;  Laterality: N/A;   HAND CARPECTOMY  12/24/2005   RIGHT-- INCLUDING  REMOVAL  WRIST WIRES AND I &D (EXICIOSNAL)  FOR OSTEOMYELITIS   HYSTEROSCOPY WITH D & C  08/18/2011   Procedure: DILATATION AND CURETTAGE (D&C) /HYSTEROSCOPY;  Surgeon: Anastasio Auerbach, MD;  Location: [facility];  Service: Gynecology;  Laterality: N/A;   INCISION / DRAINAGE HAND / FINGER  X3  (2006 & 2007)   RIGHT CRUSH WOUND   LESION REMOVAL  08/18/2011   Procedure: EXCISION VAGINAL LESION;  Surgeon: Anastasio Auerbach, MD;  Location: [facility];  Service: Gynecology;  Laterality: N/A;   ORIF CARPAL BONE FRACTURE  08/31/2005   I & D OF RIGHT HAND CRUSH INJURY/ OPEN FX'S AND INDEX FINGER RAY RESECTION/ COMPLICATED CLOSURE   ORIF RADIAL FRACTURE  09/04/2005    DISTAL RADIAL PLATE AND PARTIAL THUMB AMPUTATION   RIGHT TOTAL WRIST FUSION W/ BONE GRAFT AND PLATE  35/57/3220   AND FLAP USING RIGHT GOIN MUSCLE   skin cancer removal  2007   throat area   TUBAL LIGATION  71 YRS AGO   Family History  Problem Relation Age of Onset   Lupus Mother        died at 50   Breast cancer Maternal Aunt 35   Breast cancer Maternal Grandmother 60   Stomach cancer Paternal Grandfather    Colon cancer Neg Hx    Rectal cancer Neg Hx    Esophageal cancer Neg Hx    Inflammatory bowel disease Neg Hx    Liver disease Neg Hx    Pancreatic cancer Neg Hx    Social History   Socioeconomic History   Marital status: Married    Spouse name: Not  on file   Number of children: 6   Years of education: Not on file   Highest education level: Not on file  Occupational History   Occupation: Housekeeper   Tobacco Use   Smoking status: Former    Packs/day: 1.50    Years: 28.00    Pack years: 42.00    Types: Cigarettes    Quit date: 11/29/2001    Years since quitting: 19.8   Smokeless tobacco: Never  Vaping Use   Vaping Use: Never used  Substance and Sexual Activity   Alcohol use: Yes    Comment: wine daily   Drug use: Yes    Types: Marijuana    Comment: last smoked 12-08-20   Sexual activity: Yes    Partners: Male    Birth control/protection: Post-menopausal    Comment: 1st intercourse 61 yo-More than 5 partners  Other Topics Concern   Not on file  Social History Narrative   Lives with husband.     Social Determinants of Health   Financial Resource Strain: Medium Risk   Difficulty of Paying Living Expenses: Somewhat hard  Food Insecurity: Food Insecurity Present   Worried About [place] in the Last Year: Sometimes true   Ran Out of Food in the Last Year: Sometimes true  Transportation Needs: No Transportation Needs   Lack of Transportation (Medical): No   Lack of Transportation (Non-Medical): No  Physical Activity: Insufficiently Active    Days of Exercise per Week: 4 days   Minutes of Exercise per Session: 30 min  Stress: No Stress Concern Present   Feeling of Stress : Only a little  Social Connections: Moderately Isolated   Frequency of Communication with Friends and Family: More than three times a week   Frequency of Social Gatherings with Friends and Family: Once a week   Attends Religious Services: Never   Marine scientist or Organizations: No   Attends Music therapist: Never   Marital Status: Married    Tobacco Counseling Counseling given: Not Answered   Clinical Intake:  Pre-visit preparation completed: Yes  Pain : No/denies pain     BMI - recorded: 25.42 Nutritional Status: BMI 25 -29 Overweight Nutritional Risks: None Diabetes: No  How often do you need to have someone help you when you read instructions, pamphlets, or other written materials from your doctor or pharmacy?: 1 - Never  Diabetic?NO  Interpreter Needed?: No  Information entered by :: mj Coreena Rubalcava, lpn   Activities of Daily Living In your present state of health, do you have any difficulty performing the following activities: 10/07/2021 12/07/2020  Hearing? N N  Vision? N N  Difficulty concentrating or making decisions? Y N  Comment at times. -  Walking or climbing stairs? N -  Dressing or bathing? N N  Doing errands, shopping? N -  Preparing Food and eating ? N -  Using the Toilet? N -  In the past six months, have you accidently leaked urine? N -  Do you have problems with loss of bowel control? N -  Managing your Medications? N -  Managing your Finances? N -  Housekeeping or managing your Housekeeping? N -  Some recent data might be hidden    Patient Care Team: Susy Frizzle, MD as PCP - General (Family Medicine)  Indicate any recent Medical Services you may have received from other than [facility] providers in the past year (date may be approximate).     Assessment:  This is a routine wellness  examination for [name].  Hearing/Vision screen Hearing Screening - Comments:: Some hearing issues. Vision Screening - Comments:: Glasses. Offered referral, pt states she is in the process of looking for an Optometrist.  Dietary issues and exercise activities discussed: Current Exercise Habits: Home exercise routine, Type of exercise: walking, Time (Minutes): 30, Frequency (Times/Week): 4, Weekly Exercise (Minutes/Week): 120, Intensity: Mild, Exercise limited by: respiratory conditions(s);orthopedic condition(s)   Goals Addressed             This Visit's Progress    Exercise 3x per week (30 min per time)       Try to move as tolerated. Try to eat healthy.       Depression Screen PHQ 2/9 Scores 10/07/2021 11/12/2020 11/16/2018 01/11/2018 09/29/2017 09/15/2017 07/21/2017  PHQ - 2 Score 0 0 0 0 1 2 3   PHQ- 9 Score - - - - 6 4 15     Fall Risk Fall Risk  10/07/2021 11/12/2020 01/11/2018 06/15/2017 12/02/2016  Falls in the past year? 0 0 Yes No No  Number falls in past yr: 0 - 1 - -  Injury with Fall? 0 - No - -  Risk for fall due to : Impaired mobility No Fall Risks - - -  Follow up Falls prevention discussed Falls evaluation completed - - -    FALL RISK PREVENTION PERTAINING TO THE HOME:  Any stairs in or around the home? Yes  If so, are there any without handrails? No  Home free of loose throw rugs in walkways, pet beds, electrical cords, etc? Yes  Adequate lighting in your home to reduce risk of falls? Yes   ASSISTIVE DEVICES UTILIZED TO PREVENT FALLS:  Life alert? No  Use of a cane, walker or w/c? No  Grab bars in the bathroom? No  Shower chair or bench in shower? No  Elevated toilet seat or a handicapped toilet? Yes   TIMED UP AND GO:  Was the test performed? No .  Phone visit.   Cognitive Function:     6CIT Screen 10/07/2021  What Year? 0 points  What month? 0 points  What time? 0 points  Count back from 20 0 points  Months in reverse 0 points  Repeat phrase 2 points   Total Score 2    Immunizations Immunization History  Administered Date(s) Administered   DTaP 10/23/1959, 11/27/1959, 01/01/1960, 12/02/1960   IPV 10/23/1959, 11/27/1959, 06/28/1960   Influenza,inj,Quad PF,6+ Mos 07/15/2013, 06/12/2014, 06/26/2015, 05/14/2016, 06/13/2017, 06/27/2018, 07/06/2019, 06/23/2021   Influenza-Unspecified 05/30/2012, 05/13/2017   Janssen (J&J) SARS-COV-2 Vaccination 02/05/2020   Pneumococcal Conjugate-13 05/13/2017   Pneumococcal Polysaccharide-23 09/29/2017   Pneumococcal-Unspecified 05/13/2017   Tdap 08/20/2013   Zoster, Live 08/20/2013    TDAP status: Up to date  Flu Vaccine status: Up to date  Pneumococcal vaccine status: Up to date  Covid-19 vaccine status: Completed vaccines  Qualifies for Shingles Vaccine? Yes   Zostavax completed Yes   Shingrix Completed?: No.    Education has been provided regarding the importance of this vaccine. Patient has been advised to call insurance company to determine out of pocket expense if they have not yet received this vaccine. Advised may also receive vaccine at local pharmacy or Health Dept. Verbalized acceptance and understanding.  Screening Tests Health Maintenance  Topic Date Due   Zoster Vaccines- Shingrix (1 of 2) Never done   COVID-19 Vaccine (2 - Janssen risk series) 03/04/2020   COLONOSCOPY (Pts 45-21yrs Insurance coverage will need to be  confirmed)  10/31/2022   MAMMOGRAM  07/09/2023   PAP SMEAR-Modifier  07/31/2023   TETANUS/TDAP  08/21/2023   INFLUENZA VACCINE  Completed   Hepatitis C Screening  Completed   HIV Screening  Completed   HPV VACCINES  Aged Out    Health Maintenance  Health Maintenance Due  Topic Date Due   Zoster Vaccines- Shingrix (1 of 2) Never done   COVID-19 Vaccine (2 - Janssen risk series) 03/04/2020    Colorectal cancer screening: Type of screening: Colonoscopy. Completed 10/31/2012. Repeat every 10 years  Mammogram status: Completed 07/08/2021. Repeat every  year  Bone Density status: Ordered NOT DUE UNTIL AGE 41.. Pt provided with contact info and advised to call to schedule appt.  Lung Cancer Screening: (Low Dose CT Chest recommended if Age 15-80 years, 30 pack-year currently smoking OR have quit w/in 15years.) does qualify.   Lung Cancer Screening Referral: Last chest CT done 07/12/2020  Additional Screening:  Hepatitis C Screening: does qualify; Completed 05/16/2017  Vision Screening: Recommended annual ophthalmology exams for early detection of glaucoma and other disorders of the eye. Is the patient up to date with their annual eye exam?  No  Who is the provider or what is the name of the office in which the patient attends annual eye exams? N/A If pt is not established with a provider, would they like to be referred to a provider to establish care? No .   Dental Screening: Recommended annual dental exams for proper oral hygiene  Community Resource Referral / Chronic Care Management: CRR required this visit?  No   CCM required this visit?  Yes      Plan:     I have personally reviewed and noted the following in the patients chart:   Medical and social history Use of alcohol, tobacco or illicit drugs  Current medications and supplements including opioid prescriptions. Patient is not currently taking opioid prescriptions. Functional ability and status Nutritional status Physical activity Advanced directives List of other physicians Hospitalizations, surgeries, and ER visits in previous 12 months Vitals Screenings to include cognitive, depression, and falls Referrals and appointments  In addition, I have reviewed and discussed with patient certain preventive protocols, quality metrics, and best practice recommendations. A written personalized care plan for preventive services as well as general preventive health recommendations were provided to patient.     Chriss Driver, LPN   5/79/7282   Nurse Notes: Pt c/o  issues with food insecurity. Discussed [facility] referral and pt is agreeable. Referral made.

## 2021-10-11 ENCOUNTER — Telehealth: Payer: Self-pay

## 2021-10-11 NOTE — Telephone Encounter (Signed)
° °  Telephone encounter was:  Successful.  10/11/2021 Name: [name] MRN: [id number] DOB: 1960/11/07  [name] is a 61 y.o. year old female who is a primary care patient of Pickard, Cammie Mcgee, MD . The community resource team was consulted for assistance with Food Insecurity and Financial Difficulties related to little money  Care guide performed the following interventions: Patient provided with information about care guide support team and interviewed to confirm resource needs.Patient stated she would like me to mail her food and financial resources. Money is tight living on very little monthly income  Follow Up Plan:  Care guide will follow up with patient by phone over the next week   [place], Care Management  [phone] [address], [place], [place] [address] Phone: [phone] Email: [email or web address]

## 2021-10-19 ENCOUNTER — Telehealth: Payer: Self-pay

## 2021-10-19 NOTE — Telephone Encounter (Signed)
° °  Telephone encounter was:  Successful.  10/19/2021 Name: [name] MRN: [id number] DOB: 10-23-1960  [name] is a 61 y.o. year old female who is a primary care patient of Pickard, Cammie Mcgee, MD . The community resource team was consulted for assistance with Food Insecurity and Financial Difficulties related to Food and Financial needs  Care guide performed the following interventions: Patient provided with information about care guide support team and interviewed to confirm resource needs.Confimation for resouces recieved in mail  Follow Up Plan:  No further follow up planned at this time. The patient has been provided with needed resources.    [place] [place], Care Management  [phone] [address], [place], [place] [address] Phone: [phone] Email: [email or web address]

## 2021-11-09 ENCOUNTER — Other Ambulatory Visit: Payer: Self-pay | Admitting: Family Medicine

## 2021-11-09 ENCOUNTER — Other Ambulatory Visit: Payer: Self-pay

## 2021-11-09 ENCOUNTER — Ambulatory Visit (INDEPENDENT_AMBULATORY_CARE_PROVIDER_SITE_OTHER): Payer: Medicare Other | Admitting: Family Medicine

## 2021-11-09 ENCOUNTER — Encounter: Payer: Self-pay | Admitting: Family Medicine

## 2021-11-09 VITALS — BP 138/92 | HR 63 | Temp 97.5°F | Resp 18 | Ht 62.0 in | Wt 139.0 lb

## 2021-11-09 DIAGNOSIS — K5792 Diverticulitis of intestine, part unspecified, without perforation or abscess without bleeding: Secondary | ICD-10-CM | POA: Diagnosis not present

## 2021-11-09 MED ORDER — AMOXICILLIN-POT CLAVULANATE 875-125 MG PO TABS: 1.0000 | ORAL_TABLET | Freq: Two times a day (BID) | ORAL | 0 refills | Status: DC

## 2021-11-09 NOTE — Progress Notes (Signed)
Subjective:    Patient ID: [name], female    DOB: 02-19-1961, 61 y.o.   MRN: [id number]  Symptoms began a few days ago.  The pain started in her lower back just above her left gluteus.  It is now radiated into her left lower quadrant.  She is very tender to palpation of the left lower quadrant.  She reports abdominal pain that is constant.  It gets better if she has a bowel movement.  She denies any blood in her stool.  She denies any dysuria.  She denies any urgency or frequency.  She has had kidney stones before and she states this does not feel like a kidney stone.  She denies any fevers or chills or nausea or vomiting.  Defecation seems to help pain. Past Medical History:  Diagnosis Date   Anxiety    Aortic atherosclerosis ([place])    Arthritis    Asthma    Bed bug bite    Blood transfusion without reported diagnosis    CA - skin cancer    Chronic pain due to injury RIGHT HAND/WRIST IN 3762   Complication of anesthesia    unable to urinate after surgery   COPD (chronic obstructive pulmonary disease) ([place])    Stage 1   Crush injury of hand RIGHT IN 2007   X 6 SURG'S  LAST ONE BEING TOTAL WRIST FUSION W/ BONE GRAFT AND PLATE   Cubital tunnel syndrome    Left elbow   Depression    ETOH abuse    GERD (gastroesophageal reflux disease)    05/11/17- not recently   Hemorrhoids    History of kidney stones    passed   MRSA (methicillin resistant Staphylococcus aureus) carrier    Neuromuscular disorder ([place])    some fibromyalgia   Neuropathy    Osteopenia 07/2019   T score -1.9 FRAX 8.2% / 1.2%   PTSD (post-traumatic stress disorder) 2007 RIGHT HAND CRUSH INJURY W/ 2 FINGER AMPUTATION   Stroke ([place]) X2 CVA'S  IN 1990   LOSS OF VISION RIGHT Peripherial bilteral   Substance abuse ([place])    hx: narcotic use   UC (ulcerative colitis) ([facility])    Weakness of right leg CAUSED BY RETRIEVAL THIGH MUSCLE FOR FLAP RIGHT HAND INJURY-- 2006   RIGHT LEG GIVES OUT OCCASIONALLY   Past  Surgical History:  Procedure Laterality Date   AMPUTATION Left 05/13/2017   Procedure: Left small finger revision amputation ;  Surgeon: Roseanne Kaufman, MD;  Location: [facility];  Service: Orthopedics;  Laterality: Left;  60 mins   CARPAL TUNNEL RELEASE Left 12/07/2020   Procedure: LEFT CARPAL TUNNEL RELEASE;  Surgeon: [name], MD;  Location: [facility];  Service: Orthopedics;  Laterality: Left;   COLON SURGERY     15 years ago- fistula on colon removed   COSMETIC SURGERY     right leg and abdomen   DILATATION & CURETTAGE/HYSTEROSCOPY WITH MYOSURE N/A 06/14/2016   Procedure: DILATATION & CURETTAGE/HYSTEROSCOPY WITH MYOSURE;  Surgeon: Anastasio Auerbach, MD;  Location: [facility];  Service: Gynecology;  Laterality: N/A;   HAND CARPECTOMY  12/24/2005   RIGHT-- INCLUDING  REMOVAL  WRIST WIRES AND I &D (EXICIOSNAL)  FOR OSTEOMYELITIS   HYSTEROSCOPY WITH D & C  08/18/2011   Procedure: DILATATION AND CURETTAGE (D&C) /HYSTEROSCOPY;  Surgeon: Anastasio Auerbach, MD;  Location: [facility];  Service: Gynecology;  Laterality: N/A;   INCISION / DRAINAGE HAND / FINGER  X3  (2006 & 2007)   RIGHT CRUSH WOUND   LESION REMOVAL  08/18/2011   Procedure: EXCISION VAGINAL LESION;  Surgeon: Anastasio Auerbach, MD;  Location: [facility];  Service: Gynecology;  Laterality: N/A;   ORIF CARPAL BONE FRACTURE  08/31/2005   I & D OF RIGHT HAND CRUSH INJURY/ OPEN FX'S AND INDEX FINGER RAY RESECTION/ COMPLICATED CLOSURE   ORIF RADIAL FRACTURE  09/04/2005   DISTAL RADIAL PLATE AND PARTIAL THUMB AMPUTATION   RIGHT TOTAL WRIST FUSION W/ BONE GRAFT AND PLATE  83/66/2947   AND FLAP USING RIGHT GOIN MUSCLE   skin cancer removal  2007   throat area   TUBAL LIGATION  15 YRS AGO   Current Outpatient Medications on File Prior to Visit  Medication Sig Dispense Refill   albuterol (VENTOLIN HFA) 108 (90 Base) MCG/ACT inhaler TAKE 2 PUFFS BY MOUTH EVERY 6 HOURS AS NEEDED FOR WHEEZE  OR SHORTNESS OF BREATH 8.5 each 2   EPINEPHrine 0.3 mg/0.3 mL IJ SOAJ injection Inject into the muscle.     fluticasone-salmeterol (ADVAIR HFA) 45-21 MCG/ACT inhaler Inhale 2 puffs into the lungs 2 (two) times daily. (Patient taking differently: Inhale 1 puff into the lungs 2 (two) times daily.) 1 each 12   gabapentin (NEURONTIN) 300 MG capsule TAKE 1 CAPSULE (300 MG TOTAL) BY MOUTH 3 (THREE) TIMES DAILY WITH MEALS AS NEEDED (NERVE PAIN). 90 capsule 3   Multiple Vitamin (MULTIVITAMIN WITH MINERALS) TABS tablet Take 1 tablet by mouth daily.     pantoprazole (PROTONIX) 40 MG tablet TAKE 1 TABLET BY MOUTH EVERY DAY (Patient taking differently: 40 mg 2 (two) times daily.) 90 tablet 1   Spacer/Aero-Holding Chambers (AEROCHAMBER MV) inhaler Use as instructed 1 each 0   No current facility-administered medications on file prior to visit.   Allergies  Allergen Reactions   Aspirin Shortness Of Breath    ASTHMATIC ATTACK   Breztri Aerosphere [Budeson-Glycopyrrol-Formoterol] Other (See Comments)    Urinary retention   Codeine Other (See Comments)    ABNORMAL BEHAVIOR  Increased in bad moods  NO CODEINE BASED MEDICATIONS    Vicodin [Hydrocodone-Acetaminophen] Other (See Comments)    Extreme anxiety   Social History   Socioeconomic History   Marital status: Married    Spouse name: Not on file   Number of children: 6   Years of education: Not on file   Highest education level: Not on file  Occupational History   Occupation: Housekeeper   Tobacco Use   Smoking status: Former    Packs/day: 1.50    Years: 28.00    Pack years: 42.00    Types: Cigarettes    Quit date: 11/29/2001    Years since quitting: 19.9   Smokeless tobacco: Never  Vaping Use   Vaping Use: Never used  Substance and Sexual Activity   Alcohol use: Yes    Comment: wine daily   Drug use: Yes    Types: Marijuana    Comment: last smoked 12-08-20   Sexual activity: Yes    Partners: Male    Birth control/protection:  Post-menopausal    Comment: 1st intercourse 61 yo-More than 5 partners  Other Topics Concern   Not on file  Social History Narrative   Lives with husband.     Social Determinants of Health   Financial Resource Strain: High Risk   Difficulty of Paying Living Expenses: Very hard  Food Insecurity: Food Insecurity Present   Worried About Charity fundraiser  in the Last Year: Often true   Ran Out of Food in the Last Year: Often true  Transportation Needs: No Transportation Needs   Lack of Transportation (Medical): No   Lack of Transportation (Non-Medical): No  Physical Activity: Insufficiently Active   Days of Exercise per Week: 4 days   Minutes of Exercise per Session: 30 min  Stress: No Stress Concern Present   Feeling of Stress : Only a little  Social Connections: Moderately Isolated   Frequency of Communication with Friends and Family: More than three times a week   Frequency of Social Gatherings with Friends and Family: Once a week   Attends Religious Services: Never   Marine scientist or Organizations: No   Attends Music therapist: Never   Marital Status: Married  Human resources officer Violence: Not At Risk   Fear of Current or Ex-Partner: No   Emotionally Abused: No   Physically Abused: No   Sexually Abused: No     Review of Systems  Gastrointestinal:  Positive for abdominal pain.  All other systems reviewed and are negative.     Objective:   Physical Exam Vitals reviewed.  Constitutional:      General: She is not in acute distress.    Appearance: Normal appearance. She is normal weight. She is not ill-appearing, toxic-appearing or diaphoretic.  HENT:     Nose: Nose normal. No congestion or rhinorrhea.     Mouth/Throat:     Pharynx: No oropharyngeal exudate or posterior oropharyngeal erythema.  Cardiovascular:     Rate and Rhythm: Normal rate and regular rhythm.     Pulses: Normal pulses.     Heart sounds: Normal heart sounds. No murmur heard.    No friction rub. No gallop.  Pulmonary:     Effort: Pulmonary effort is normal. No respiratory distress.     Breath sounds: Normal breath sounds. No stridor or decreased air movement. No wheezing, rhonchi or rales.  Abdominal:     General: Bowel sounds are decreased. There is no distension.     Palpations: Abdomen is soft. There is no mass.     Tenderness: There is abdominal tenderness in the left lower quadrant. There is no guarding or rebound.     Hernia: No hernia is present.  Neurological:     Mental Status: She is alert.          Assessment & Plan:  Diverticulitis - Plan: amoxicillin-clavulanate (AUGMENTIN) 875-125 MG tablet I believe the patient's abdominal pain is likely diverticulitis.  Begin Augmentin 875 mg twice daily for 10 days.  Add MiraLAX daily.  Reassess in 48 hours if no better or sooner if worsening.  If severe pain develops on her to go immediately to the emergency room so she can get a CAT scan to evaluate further.  Patient understands these instructions.

## 2021-12-30 ENCOUNTER — Other Ambulatory Visit: Payer: Self-pay | Admitting: Family Medicine

## 2022-01-17 ENCOUNTER — Ambulatory Visit (INDEPENDENT_AMBULATORY_CARE_PROVIDER_SITE_OTHER): Payer: Medicare Other | Admitting: Family Medicine

## 2022-01-17 VITALS — BP 140/90 | HR 65 | Temp 97.7°F | Ht 62.0 in | Wt 137.7 lb

## 2022-01-17 DIAGNOSIS — Z1211 Encounter for screening for malignant neoplasm of colon: Secondary | ICD-10-CM

## 2022-01-17 DIAGNOSIS — R1011 Right upper quadrant pain: Secondary | ICD-10-CM | POA: Diagnosis not present

## 2022-01-17 DIAGNOSIS — R1032 Left lower quadrant pain: Secondary | ICD-10-CM | POA: Diagnosis not present

## 2022-01-17 NOTE — Progress Notes (Signed)
? ?Subjective:  ? ? Patient ID: [name], female    DOB: 1960-09-20, 61 y.o.   MRN: [id number] ? ?Patient states she has 2 different types of abdominal pain.  She has been having daily left lower quadrant abdominal pain.  She states that it starts every morning when she wakes up.  It does not improve until she completely evacuates her bowels.  She has a bowel movement every day however the pain gets worse and feels she "empties her bowels".  She takes about 3 to 4 hours per accomplish this in the morning.  However she continues to have tenderness and pain in that area.  I recently treated her for diverticulitis in February.  That pain went away but there is some residual chronic pain in the left lower quadrant this today presents.  She has never had a colonoscopy.  She also recently had sharp right upper quadrant abdominal pain.  The pain was located just to the right of the xiphoid process.  It occurred suddenly after eating peanuts.  It lasted about an hour.  It was intense.  The patient thought she was going to have to go to the hospital.  The pain suddenly and completely resolved.  She denies any heavy alcohol use.  She states that she is drink 1 or 2 glasses of wine a night but have been.  She had just eaten peanuts before it happened and some lemonade.  She is taking Protonix on a daily basis.  She denies any melena or hematochezia ? ?Past Medical History:  ?Diagnosis Date  ? Anxiety   ? Aortic atherosclerosis ([place])   ? Arthritis   ? Asthma   ? Bed bug bite   ? Blood transfusion without reported diagnosis   ? CA - skin cancer   ? Chronic pain due to injury RIGHT HAND/WRIST IN 2007  ? Complication of anesthesia   ? unable to urinate after surgery  ? COPD (chronic obstructive pulmonary disease) ([place])   ? Stage 1  ? Crush injury of hand RIGHT IN 2007  ? X 6 SURG'S  LAST ONE BEING TOTAL WRIST FUSION W/ BONE GRAFT AND PLATE  ? Cubital tunnel syndrome   ? Left elbow  ? Depression   ? ETOH abuse   ? GERD  (gastroesophageal reflux disease)   ? 05/11/17- not recently  ? Hemorrhoids   ? History of kidney stones   ? passed  ? MRSA (methicillin resistant Staphylococcus aureus) carrier   ? Neuromuscular disorder ([place])   ? some fibromyalgia  ? Neuropathy   ? Osteopenia 07/2019  ? T score -1.9 FRAX 8.2% / 1.2%  ? PTSD (post-traumatic stress disorder) 2007 RIGHT HAND CRUSH INJURY W/ 2 FINGER AMPUTATION  ? Stroke ([place]) X2 CVA'S  IN 1990  ? LOSS OF VISION RIGHT Peripherial bilteral  ? Substance abuse ([place])   ? hx: narcotic use  ? UC (ulcerative colitis) ([place])   ? Weakness of right leg CAUSED BY RETRIEVAL THIGH MUSCLE FOR FLAP RIGHT HAND INJURY-- 2006  ? RIGHT LEG GIVES OUT OCCASIONALLY  ? ?Past Surgical History:  ?Procedure Laterality Date  ? AMPUTATION Left 05/13/2017  ? Procedure: Left small finger revision amputation ;  Surgeon: Roseanne Kaufman, MD;  Location: [facility];  Service: Orthopedics;  Laterality: Left;  60 mins  ? CARPAL TUNNEL RELEASE Left 12/07/2020  ? Procedure: LEFT CARPAL TUNNEL RELEASE;  Surgeon: [name], MD;  Location: [facility];  Service: Orthopedics;  Laterality:  Left;  ? COLON SURGERY    ? 15 years ago- fistula on colon removed  ? COSMETIC SURGERY    ? right leg and abdomen  ? DILATATION & CURETTAGE/HYSTEROSCOPY WITH MYOSURE N/A 06/14/2016  ? Procedure: [facility];  Surgeon: Anastasio Auerbach, MD;  Location: [facility];  Service: Gynecology;  Laterality: N/A;  ? HAND CARPECTOMY  12/24/2005  ? RIGHT-- INCLUDING  REMOVAL  WRIST WIRES AND I &D (EXICIOSNAL)  FOR OSTEOMYELITIS  ? HYSTEROSCOPY WITH D & C  08/18/2011  ? Procedure: DILATATION AND CURETTAGE (D&C) /HYSTEROSCOPY;  Surgeon: Anastasio Auerbach, MD;  Location: [facility];  Service: Gynecology;  Laterality: N/A;  ? INCISION / DRAINAGE HAND / FINGER  X3  (2006 & 2007)  ? RIGHT CRUSH WOUND  ? LESION REMOVAL  08/18/2011  ? Procedure: EXCISION VAGINAL LESION;  Surgeon: Anastasio Auerbach,  MD;  Location: [facility];  Service: Gynecology;  Laterality: N/A;  ? ORIF CARPAL BONE FRACTURE  08/31/2005  ? I & D OF RIGHT HAND CRUSH INJURY/ OPEN FX'S AND INDEX FINGER RAY RESECTION/ COMPLICATED CLOSURE  ? ORIF RADIAL FRACTURE  09/04/2005  ? DISTAL RADIAL PLATE AND PARTIAL THUMB AMPUTATION  ? RIGHT TOTAL WRIST FUSION W/ BONE GRAFT AND PLATE  87/68/1157  ? AND FLAP USING RIGHT GOIN MUSCLE  ? skin cancer removal  2007  ? throat area  ? TUBAL LIGATION  15 YRS AGO  ? ?Current Outpatient Medications on File Prior to Visit  ?Medication Sig Dispense Refill  ? albuterol (VENTOLIN HFA) 108 (90 Base) MCG/ACT inhaler TAKE 2 PUFFS BY MOUTH EVERY 6 HOURS AS NEEDED FOR WHEEZE OR SHORTNESS OF BREATH 8.5 each 2  ? amoxicillin-clavulanate (AUGMENTIN) 875-125 MG tablet Take 1 tablet by mouth 2 (two) times daily. 20 tablet 0  ? EPINEPHrine 0.3 mg/0.3 mL IJ SOAJ injection Inject into the muscle.    ? fluticasone-salmeterol (ADVAIR HFA) 45-21 MCG/ACT inhaler Inhale 2 puffs into the lungs 2 (two) times daily. (Patient taking differently: Inhale 1 puff into the lungs 2 (two) times daily.) 1 each 12  ? gabapentin (NEURONTIN) 300 MG capsule TAKE 1 CAPSULE (300 MG TOTAL) BY MOUTH 3 (THREE) TIMES DAILY WITH MEALS AS NEEDED (NERVE PAIN). 90 capsule 3  ? Multiple Vitamin (MULTIVITAMIN WITH MINERALS) TABS tablet Take 1 tablet by mouth daily.    ? pantoprazole (PROTONIX) 40 MG tablet TAKE 1 TABLET BY MOUTH EVERY DAY 90 tablet 1  ? Spacer/Aero-Holding Chambers (AEROCHAMBER MV) inhaler Use as instructed 1 each 0  ? ?No current facility-administered medications on file prior to visit.  ? ?Allergies  ?Allergen Reactions  ? Aspirin Shortness Of Breath  ?  ASTHMATIC ATTACK  ? Breztri Aerosphere [Budeson-Glycopyrrol-Formoterol] Other (See Comments)  ?  Urinary retention  ? Codeine Other (See Comments)  ?  ABNORMAL BEHAVIOR  Increased in bad moods ? ?NO CODEINE BASED MEDICATIONS ?  ? Vicodin [Hydrocodone-Acetaminophen] Other (See  Comments)  ?  Extreme anxiety  ? ?Social History  ? ?Socioeconomic History  ? Marital status: Married  ?  Spouse name: Not on file  ? Number of children: 6  ? Years of education: Not on file  ? Highest education level: Not on file  ?Occupational History  ? Occupation: Housekeeper   ?Tobacco Use  ? Smoking status: Former  ?  Packs/day: 1.50  ?  Years: 28.00  ?  Pack years: 42.00  ?  Types: Cigarettes  ?  Quit date: 11/29/2001  ?  Years since quitting: 20.1  ? Smokeless tobacco: Never  ?Vaping Use  ? Vaping Use: Never used  ?Substance and Sexual Activity  ? Alcohol use: Yes  ?  Comment: wine daily  ? Drug use: Yes  ?  Types: Marijuana  ?  Comment: last smoked 12-08-20  ? Sexual activity: Yes  ?  Partners: Male  ?  Birth control/protection: Post-menopausal  ?  Comment: 1st intercourse 61 yo-More than 5 partners  ?Other Topics Concern  ? Not on file  ?Social History Narrative  ? Lives with husband.    ? ?Social Determinants of Health  ? ?Financial Resource Strain: High Risk  ? Difficulty of Paying Living Expenses: Very hard  ?Food Insecurity: Food Insecurity Present  ? Worried About Charity fundraiser in the Last Year: Often true  ? Ran Out of Food in the Last Year: Often true  ?Transportation Needs: No Transportation Needs  ? Lack of Transportation (Medical): No  ? Lack of Transportation (Non-Medical): No  ?Physical Activity: Insufficiently Active  ? Days of Exercise per Week: 4 days  ? Minutes of Exercise per Session: 30 min  ?Stress: No Stress Concern Present  ? Feeling of Stress : Only a little  ?Social Connections: Moderately Isolated  ? Frequency of Communication with Friends and Family: More than three times a week  ? Frequency of Social Gatherings with Friends and Family: Once a week  ? Attends Religious Services: Never  ? Active Member of Clubs or Organizations: No  ? Attends Archivist Meetings: Never  ? Marital Status: Married  ?Intimate Partner Violence: Not At Risk  ? Fear of Current or Ex-Partner:  No  ? Emotionally Abused: No  ? Physically Abused: No  ? Sexually Abused: No  ? ? ? ?Review of Systems  ?Gastrointestinal:  Positive for abdominal pain.  ?All other systems reviewed and are negative. ? ?   ?Ob

## 2022-01-18 LAB — CBC WITH DIFFERENTIAL/PLATELET
Absolute Monocytes: 752 cells/uL (ref 200–950)
Basophils Absolute: 82 cells/uL (ref 0–200)
Basophils Relative: 0.8 %
Eosinophils Absolute: 278 cells/uL (ref 15–500)
Eosinophils Relative: 2.7 %
HCT: 38.7 % (ref 35.0–45.0)
Hemoglobin: 13.1 g/dL (ref 11.7–15.5)
Lymphs Abs: 3780 cells/uL (ref 850–3900)
MCH: 32.3 pg (ref 27.0–33.0)
MCHC: 33.9 g/dL (ref 32.0–36.0)
MCV: 95.6 fL (ref 80.0–100.0)
MPV: 10.1 fL (ref 7.5–12.5)
Monocytes Relative: 7.3 %
Neutro Abs: 5408 cells/uL (ref 1500–7800)
Neutrophils Relative %: 52.5 %
Platelets: 363 10*3/uL (ref 140–400)
RBC: 4.05 10*6/uL (ref 3.80–5.10)
RDW: 12.3 % (ref 11.0–15.0)
Total Lymphocyte: 36.7 %
WBC: 10.3 10*3/uL (ref 3.8–10.8)

## 2022-01-18 LAB — COMPLETE METABOLIC PANEL WITH GFR
AG Ratio: 1.7 (calc) (ref 1.0–2.5)
ALT: 12 U/L (ref 6–29)
AST: 18 U/L (ref 10–35)
Albumin: 4.4 g/dL (ref 3.6–5.1)
Alkaline phosphatase (APISO): 62 U/L (ref 37–153)
BUN: 8 mg/dL (ref 7–25)
CO2: 27 mmol/L (ref 20–32)
Calcium: 10.1 mg/dL (ref 8.6–10.4)
Chloride: 106 mmol/L (ref 98–110)
Creat: 0.68 mg/dL (ref 0.50–1.05)
Globulin: 2.6 g/dL (calc) (ref 1.9–3.7)
Glucose, Bld: 84 mg/dL (ref 65–99)
Potassium: 4.5 mmol/L (ref 3.5–5.3)
Sodium: 141 mmol/L (ref 135–146)
Total Bilirubin: 0.3 mg/dL (ref 0.2–1.2)
Total Protein: 7 g/dL (ref 6.1–8.1)
eGFR: 98 mL/min/{1.73_m2} (ref >=60)

## 2022-01-18 LAB — LIPASE: Lipase: 26 U/L (ref 7–60)

## 2022-01-21 ENCOUNTER — Ambulatory Visit
Admission: RE | Admit: 2022-01-21 | Discharge: 2022-01-21 | Disposition: A | Discharge status: Home / Self Care | Payer: Medicare Other | Source: Ambulatory Visit | Attending: Family Medicine | Admitting: Family Medicine

## 2022-01-21 DIAGNOSIS — R1013 Epigastric pain: Secondary | ICD-10-CM | POA: Diagnosis not present

## 2022-01-21 DIAGNOSIS — R1011 Right upper quadrant pain: Secondary | ICD-10-CM

## 2022-01-27 DIAGNOSIS — Z1211 Encounter for screening for malignant neoplasm of colon: Secondary | ICD-10-CM | POA: Diagnosis not present

## 2022-01-31 NOTE — Progress Notes (Unsigned)
Synopsis: Referred for COPD by Susy Frizzle, MD  Subjective:   PATIENT ID: [name] GENDER: female DOB: 1961-02-06, MRN: [id number]  No chief complaint on file.  61yF with COPD on serevent (urinary retention with LAMA, 'side effects' with ICS), asthma, UC, GERD, 42 py smoker quit 2003  Right now the only inhaler she's using is albuterol. She says she used serevent for some time but stopped due to an adverse effect that she can't recall.   Her PCP prescribed incruse and she tried it for 3 months but then got some throat irritation.   With trelegy she had urinary retention.   Hoarseness with ICS limits her ability to use any inhaler with ICS.   She wonders if her wheezing is driven more by allergy. She does have a new dog and wonders if her recent increased wheezing is related.  She just finished a course of steroids for dyspnea attributed to COPD. She emphasizes that her throat is where she feels tightness, wheeze. She saw an ENT a long time ago.   She denies sinonasal congestion, PND but does have GERD that requires twice daily ppi. Smokes MJ now only with water bong.  Interval HPI:  Last seen by me 06/23/21 at which point started advair, referred to allergy. CT given some CXR suggestive of bronchiectasis. No bronchiectasis but there was diffuse subtle micronodularity.  She is using advair hfa inhaler 1 puff twice daily with spacer. Rinses mouth after using advair. Has not needed any courses of steroids since last visit. Apart from morning productive cough not bothered at all by cough. No DOE.   Otherwise pertinent review of systems is negative.    Past Medical History:  Diagnosis Date   Anxiety    Aortic atherosclerosis ([place])    Arthritis    Asthma    Bed bug bite    Blood transfusion without reported diagnosis    CA - skin cancer    Chronic pain due to injury RIGHT HAND/WRIST IN 0277   Complication of anesthesia    unable to urinate after surgery   COPD  (chronic obstructive pulmonary disease) ([place])    Stage 1   Crush injury of hand RIGHT IN 2007   X 6 SURG'S  LAST ONE BEING TOTAL WRIST FUSION W/ BONE GRAFT AND PLATE   Cubital tunnel syndrome    Left elbow   Depression    ETOH abuse    GERD (gastroesophageal reflux disease)    05/11/17- not recently   Hemorrhoids    History of kidney stones    passed   MRSA (methicillin resistant Staphylococcus aureus) carrier    Neuromuscular disorder ([place])    some fibromyalgia   Neuropathy    Osteopenia 07/2019   T score -1.9 FRAX 8.2% / 1.2%   PTSD (post-traumatic stress disorder) 2007 RIGHT HAND CRUSH INJURY W/ 2 FINGER AMPUTATION   Stroke ([place]) X2 CVA'S  IN 1990   LOSS OF VISION RIGHT Peripherial bilteral   Substance abuse ([place])    hx: narcotic use   UC (ulcerative colitis) (HCC)    Weakness of right leg CAUSED BY RETRIEVAL THIGH MUSCLE FOR FLAP RIGHT HAND INJURY-- 2006   RIGHT LEG GIVES OUT OCCASIONALLY     Family History  Problem Relation Age of Onset   Lupus Mother        died at 30   Breast cancer Maternal Aunt 35   Breast cancer Maternal Grandmother 60   Stomach cancer Paternal Grandfather  Colon cancer Neg Hx    Rectal cancer Neg Hx    Esophageal cancer Neg Hx    Inflammatory bowel disease Neg Hx    Liver disease Neg Hx    Pancreatic cancer Neg Hx      Past Surgical History:  Procedure Laterality Date   AMPUTATION Left 05/13/2017   Procedure: Left small finger revision amputation ;  Surgeon: Roseanne Kaufman, MD;  Location: [facility];  Service: Orthopedics;  Laterality: Left;  60 mins   CARPAL TUNNEL RELEASE Left 12/07/2020   Procedure: LEFT CARPAL TUNNEL RELEASE;  Surgeon: [name], MD;  Location: [facility];  Service: Orthopedics;  Laterality: Left;   COLON SURGERY     15 years ago- fistula on colon removed   COSMETIC SURGERY     right leg and abdomen   DILATATION & CURETTAGE/HYSTEROSCOPY WITH MYOSURE N/A 06/14/2016   Procedure: DILATATION &  CURETTAGE/HYSTEROSCOPY WITH MYOSURE;  Surgeon: Anastasio Auerbach, MD;  Location: [facility];  Service: Gynecology;  Laterality: N/A;   HAND CARPECTOMY  12/24/2005   RIGHT-- INCLUDING  REMOVAL  WRIST WIRES AND I &D (EXICIOSNAL)  FOR OSTEOMYELITIS   HYSTEROSCOPY WITH D & C  08/18/2011   Procedure: DILATATION AND CURETTAGE (D&C) /HYSTEROSCOPY;  Surgeon: Anastasio Auerbach, MD;  Location: [facility];  Service: Gynecology;  Laterality: N/A;   INCISION / DRAINAGE HAND / FINGER  X3  (2006 & 2007)   RIGHT CRUSH WOUND   LESION REMOVAL  08/18/2011   Procedure: EXCISION VAGINAL LESION;  Surgeon: Anastasio Auerbach, MD;  Location: [facility];  Service: Gynecology;  Laterality: N/A;   ORIF CARPAL BONE FRACTURE  08/31/2005   I & D OF RIGHT HAND CRUSH INJURY/ OPEN FX'S AND INDEX FINGER RAY RESECTION/ COMPLICATED CLOSURE   ORIF RADIAL FRACTURE  09/04/2005   DISTAL RADIAL PLATE AND PARTIAL THUMB AMPUTATION   RIGHT TOTAL WRIST FUSION W/ BONE GRAFT AND PLATE  93/81/0175   AND FLAP USING RIGHT GOIN MUSCLE   skin cancer removal  2007   throat area   TUBAL LIGATION  15 YRS AGO    Social History   Socioeconomic History   Marital status: Married    Spouse name: Not on file   Number of children: 6   Years of education: Not on file   Highest education level: Not on file  Occupational History   Occupation: Housekeeper   Tobacco Use   Smoking status: Former    Packs/day: 1.50    Years: 28.00    Pack years: 42.00    Types: Cigarettes    Quit date: 11/29/2001    Years since quitting: 20.1   Smokeless tobacco: Never  Vaping Use   Vaping Use: Never used  Substance and Sexual Activity   Alcohol use: Yes    Comment: wine daily   Drug use: Yes    Types: Marijuana    Comment: last smoked 12-08-20   Sexual activity: Yes    Partners: Male    Birth control/protection: Post-menopausal    Comment: 1st intercourse 61 yo-More than 5 partners  Other Topics Concern   Not on file   Social History Narrative   Lives with husband.     Social Determinants of Health   Financial Resource Strain: High Risk   Difficulty of Paying Living Expenses: Very hard  Food Insecurity: Food Insecurity Present   Worried About [place] in the Last Year: Often true   Ran  Out of Food in the Last Year: Often true  Transportation Needs: No Transportation Needs   Lack of Transportation (Medical): No   Lack of Transportation (Non-Medical): No  Physical Activity: Insufficiently Active   Days of Exercise per Week: 4 days   Minutes of Exercise per Session: 30 min  Stress: No Stress Concern Present   Feeling of Stress : Only a little  Social Connections: Moderately Isolated   Frequency of Communication with Friends and Family: More than three times a week   Frequency of Social Gatherings with Friends and Family: Once a week   Attends Religious Services: Never   Marine scientist or Organizations: No   Attends Music therapist: Never   Marital Status: Married  Human resources officer Violence: Not At Risk   Fear of Current or Ex-Partner: No   Emotionally Abused: No   Physically Abused: No   Sexually Abused: No     Allergies  Allergen Reactions   Aspirin Shortness Of Breath    ASTHMATIC ATTACK   Data processing manager [Budeson-Glycopyrrol-Formoterol] Other (See Comments)    Urinary retention   Codeine Other (See Comments)    ABNORMAL BEHAVIOR  Increased in bad moods  NO CODEINE BASED MEDICATIONS    Vicodin [Hydrocodone-Acetaminophen] Other (See Comments)    Extreme anxiety     Outpatient Medications Prior to Visit  Medication Sig Dispense Refill   albuterol (VENTOLIN HFA) 108 (90 Base) MCG/ACT inhaler TAKE 2 PUFFS BY MOUTH EVERY 6 HOURS AS NEEDED FOR WHEEZE OR SHORTNESS OF BREATH 8.5 each 2   amoxicillin-clavulanate (AUGMENTIN) 875-125 MG tablet Take 1 tablet by mouth 2 (two) times daily. 20 tablet 0   EPINEPHrine 0.3 mg/0.3 mL IJ SOAJ injection Inject into  the muscle.     fluticasone-salmeterol (ADVAIR HFA) 45-21 MCG/ACT inhaler Inhale 2 puffs into the lungs 2 (two) times daily. (Patient taking differently: Inhale 1 puff into the lungs 2 (two) times daily.) 1 each 12   gabapentin (NEURONTIN) 300 MG capsule TAKE 1 CAPSULE (300 MG TOTAL) BY MOUTH 3 (THREE) TIMES DAILY WITH MEALS AS NEEDED (NERVE PAIN). 90 capsule 3   Multiple Vitamin (MULTIVITAMIN WITH MINERALS) TABS tablet Take 1 tablet by mouth daily. (Patient not taking: Reported on 01/17/2022)     pantoprazole (PROTONIX) 40 MG tablet TAKE 1 TABLET BY MOUTH EVERY DAY 90 tablet 1   Spacer/Aero-Holding Chambers (AEROCHAMBER MV) inhaler Use as instructed 1 each 0   No facility-administered medications prior to visit.       Objective:   Physical Exam:  General appearance: 61 y.o., female, NAD, conversant  Eyes: anicteric sclerae; PERRL, tracking appropriately HENT: NCAT; MMM Neck: Trachea midline; no lymphadenopathy, no JVD Lungs: CTAB, no crackles, no wheeze, with normal respiratory effort CV: RRR, no murmur  Abdomen: Soft, non-tender; non-distended, BS present  Extremities: No peripheral edema, warm Skin: Normal turgor and texture; no rash Psych: Appropriate affect Neuro: Alert and oriented to person and place, no focal deficit     There were no vitals filed for this visit.     on RA BMI Readings from Last 3 Encounters:  01/17/22 25.19 kg/m  11/09/21 25.42 kg/m  10/07/21 25.42 kg/m   Wt Readings from Last 3 Encounters:  01/17/22 137 lb 11.2 oz (62.5 kg)  11/09/21 139 lb (63 kg)  10/07/21 139 lb (63 kg)     CBC    Component Value Date/Time   WBC 10.3 01/17/2022 1539   RBC 4.05 01/17/2022 1539   HGB 13.1  01/17/2022 1539   HCT 38.7 01/17/2022 1539   PLT 363 01/17/2022 1539   MCV 95.6 01/17/2022 1539   MCH 32.3 01/17/2022 1539   MCHC 33.9 01/17/2022 1539   RDW 12.3 01/17/2022 1539   LYMPHSABS 3,780 01/17/2022 1539   MONOABS 0.8 06/23/2021 1446   EOSABS 278  01/17/2022 1539   BASOSABS 82 01/17/2022 1539    Eosinophils 400 IgE 368  Chest Imaging:  CXR reviewed by me with likely atelectasis/scar in lingula similar to that from CT A/P 06/11/20  CT Chest 07/12/21 reviewed by me without bronchiectasis. There is diffuse subtle micronodularity  Pulmonary Functions Testing Results:    Latest Ref Rng & Units 04/29/2019    4:01 PM  PFT Results  FVC-Pre L 2.62  P  FVC-Predicted Pre % 84  P  FVC-Post L 2.66  P  FVC-Predicted Post % 86  P  Pre FEV1/FVC % % 57  P  Post FEV1/FCV % % 57  P  FEV1-Pre L 1.49  P  FEV1-Predicted Pre % 62  P  FEV1-Post L 1.52  P  DLCO uncorrected ml/min/mmHg 18.45  P  DLCO UNC% % 97  P  DLVA Predicted % 79  P  TLC L 6.67  P  TLC % Predicted % 140  P  RV % Predicted % 196  P    P Preliminary result   Moderate obstruction but with clinically important improvement in FEV1 relative to PFTs 2018. Also has hyperinflation, air trapping, normal DLCO, normal flow volume loops. Neg BD response    Echocardiogram:   Negative ECG stress test 03/2021      Assessment & Plan:   # Asthma-COPD overlap: # Eosinophilic asthma # COPD gold functional group D: variety of intolerances to controller inhalers but she is doing great on advair HFA, following with allergy as well. ICS usually gets throat irritation despite rinsing mouth, LAMA gets urinary retention, LABA she is unsure but thinks she hasn't tolerated before. If later not responsive to LABA/ low dose ICS with spacer and still has throat tightness will consider referral to ENT for possible irritable larynx from GERD vs VCD.   Plan: - advair HFA 1 puff twice daily with spacer, rinse mouth afterward - prn albuterol - following with allergy   RTC 6 months  Maryjane Hurter, MD [facility] 01/31/2022 1:10 PM

## 2022-02-01 ENCOUNTER — Telehealth: Payer: Self-pay | Admitting: Student

## 2022-02-01 ENCOUNTER — Encounter: Payer: Self-pay | Admitting: Student

## 2022-02-01 ENCOUNTER — Ambulatory Visit (INDEPENDENT_AMBULATORY_CARE_PROVIDER_SITE_OTHER): Payer: Medicare Other | Admitting: Student

## 2022-02-01 VITALS — BP 144/78 | HR 61 | Temp 98.1°F | Ht 62.0 in | Wt 136.0 lb

## 2022-02-01 DIAGNOSIS — B370 Candidal stomatitis: Secondary | ICD-10-CM

## 2022-02-01 DIAGNOSIS — J449 Chronic obstructive pulmonary disease, unspecified: Secondary | ICD-10-CM | POA: Diagnosis not present

## 2022-02-01 MED ORDER — NYSTATIN 100000 UNIT/ML MT SUSP: 5.0000 mL | Freq: Four times a day (QID) | OROMUCOSAL | 0 refills | Status: DC

## 2022-02-01 NOTE — Patient Instructions (Addendum)
-   will see what we can do to help you remain on advair HFA 1-2 puffs twice daily WITH spacer - rinse/gargle mouth after each use, rinse out spacer too

## 2022-02-01 NOTE — Telephone Encounter (Signed)
Pt seen in clinic today and reports that her Advair HFA was changed to the generic. She is not doing well with this but does great with name brand Advair HFA. Pharm team, is there a PA or something that can be submitted so that she gets the brand name only? Please advise, thanks!

## 2022-02-02 ENCOUNTER — Other Ambulatory Visit (HOSPITAL_COMMUNITY): Payer: Self-pay

## 2022-02-02 MED ORDER — ADVAIR HFA 45-21 MCG/ACT IN AERO: 2.0000 | INHALATION_SPRAY | Freq: Two times a day (BID) | RESPIRATORY_TRACT | 12 refills | Status: DC

## 2022-02-02 NOTE — Telephone Encounter (Signed)
Spoke with the pt and notified brand name rx covered and has been sent to pharm. Nothing further needed.

## 2022-02-02 NOTE — Telephone Encounter (Signed)
Remo Lipps to Me  Rx Prior Auth Team  Meier, Hortencia Conradi, MD      8:25 AM Good morning, I ran a test claim for the brand name Advair HFA inhaler and I got a paid claim, so I called patient's pharmacy to have them run it also. The copay is $10.35 for brand name. When you guys send script over to the pharmacy if the patient prefers brand write it as brand with DAW, or advise the patient to make the pharmacy aware she prefers brand.    [facility]fa. Called pt to let her know and there was no answer- LMTCB.

## 2022-02-03 ENCOUNTER — Other Ambulatory Visit: Payer: Self-pay

## 2022-02-03 ENCOUNTER — Other Ambulatory Visit: Payer: Self-pay | Admitting: Family Medicine

## 2022-02-03 ENCOUNTER — Telehealth: Payer: Self-pay | Admitting: Family Medicine

## 2022-02-03 MED ORDER — LINACLOTIDE 145 MCG PO CAPS: 145.0000 ug | ORAL_CAPSULE | Freq: Every day | ORAL | 11 refills | Status: DC

## 2022-02-03 NOTE — Telephone Encounter (Signed)
Pt was given sample for 51mg  , however pt  has been taking 2  of 741m do you want to in increase the dose. It come in 14532m please advise.

## 2022-02-03 NOTE — Telephone Encounter (Signed)
Patient called to request Rx for [facility] 140MG; states provider gave her samples and it's working well for her. Patient only has sample of 72MG but that strength doesn't help her (taking 2 at a time. Patient has enough to last 3 more days.  Pharmacy confirmed as:  [id number] [place] [address]  [address] [id number] Phone:  [phone] Fax:  [phone] DEA #:  [id number] Please advise at [phone]

## 2022-02-05 LAB — COLOGUARD: COLOGUARD: NEGATIVE

## 2022-03-01 ENCOUNTER — Other Ambulatory Visit: Payer: Self-pay | Admitting: Family Medicine

## 2022-03-01 NOTE — Telephone Encounter (Signed)
Requested Prescriptions  Pending Prescriptions Disp Refills  . gabapentin (NEURONTIN) 300 MG capsule [Pharmacy Med Name: GABAPENTIN 300 MG CAPSULE] 90 capsule 3    Sig: TAKE 1 CAPSULE (300 MG TOTAL) BY MOUTH 3 (THREE) TIMES DAILY WITH MEALS AS NEEDED (NERVE PAIN).     Neurology: Anticonvulsants - gabapentin Passed - 03/01/2022  2:04 PM      Passed - Cr in normal range and within 360 days    Creat  Date Value Ref Range Status  01/17/2022 0.68 0.50 - 1.05 mg/dL Final         Passed - Completed PHQ-2 or PHQ-9 in the last 360 days      Passed - Valid encounter within last 12 months    Recent Outpatient Visits          1 month ago Colon cancer screening   [place] Susy Frizzle, MD   3 months ago Diverticulitis   [place] Susy Frizzle, MD   9 months ago Dyspnea, unspecified type   [place] Susy Frizzle, MD   1 year ago Chest pain, unspecified type   [place] Susy Frizzle, MD   1 year ago Generalized abdominal pain   [place], Cammie Mcgee, MD

## 2022-04-09 ENCOUNTER — Other Ambulatory Visit: Payer: Self-pay | Admitting: Student

## 2022-05-05 ENCOUNTER — Other Ambulatory Visit: Payer: Self-pay

## 2022-05-05 ENCOUNTER — Ambulatory Visit (INDEPENDENT_AMBULATORY_CARE_PROVIDER_SITE_OTHER): Payer: Medicare Other | Admitting: Family Medicine

## 2022-05-05 VITALS — BP 150/95 | HR 72 | Temp 97.9°F | Ht 62.0 in | Wt 137.0 lb

## 2022-05-05 DIAGNOSIS — R102 Pelvic and perineal pain: Secondary | ICD-10-CM

## 2022-05-05 LAB — URINALYSIS, ROUTINE W REFLEX MICROSCOPIC
Bacteria, UA: NONE SEEN /HPF
Bilirubin Urine: NEGATIVE
Glucose, UA: NEGATIVE
Hyaline Cast: NONE SEEN /LPF
Ketones, ur: NEGATIVE
Leukocytes,Ua: NEGATIVE
Nitrite: NEGATIVE
Protein, ur: NEGATIVE
Specific Gravity, Urine: 1.002 (ref 1.001–1.035)
WBC, UA: NONE SEEN /HPF (ref 0–5)
pH: 6 (ref 5.0–8.0)

## 2022-05-05 LAB — MICROSCOPIC MESSAGE

## 2022-05-05 MED ORDER — SULFAMETHOXAZOLE-TRIMETHOPRIM 800-160 MG PO TABS: 1.0000 | ORAL_TABLET | Freq: Two times a day (BID) | ORAL | 0 refills | Status: DC

## 2022-05-05 NOTE — Progress Notes (Signed)
Subjective:    Patient ID: [name], female    DOB: [birth year], 61 y.o.   MRN: [id number] Patient presents today complaining of pelvic discomfort for 1 month.  The pain is located in the suprapubic area.  She states that it hurts when she lays down but it gets better when she stands up.  She denies any dysuria or frequency but she does have some urgency.  Urinalysis today shows trace blood but negative nitrites and negative leukocyte esterase.  She denies any fever or chills.  She denies any nausea or vomiting.  She denies any vaginal discharge.  Pelvic exam was performed today with a chaperone.  Patient has cervical motion tenderness.  There is no palpable masses.  She also has some tenderness in the left posterior fornix.  However there is no palpable mass in that area Past Medical History:  Diagnosis Date   Anxiety    Aortic atherosclerosis ([place])    Arthritis    Asthma    Bed bug bite    Blood transfusion without reported diagnosis    CA - skin cancer    Chronic pain due to injury RIGHT HAND/WRIST IN 6384   Complication of anesthesia    unable to urinate after surgery   COPD (chronic obstructive pulmonary disease) ([place])    Stage 1   Crush injury of hand RIGHT IN 2007   X 6 SURG'S  LAST ONE BEING TOTAL WRIST FUSION W/ BONE GRAFT AND PLATE   Cubital tunnel syndrome    Left elbow   Depression    ETOH abuse    GERD (gastroesophageal reflux disease)    05/11/17- not recently   Hemorrhoids    History of kidney stones    passed   MRSA (methicillin resistant Staphylococcus aureus) carrier    Neuromuscular disorder ([place])    some fibromyalgia   Neuropathy    Osteopenia 07/2019   T score -1.9 FRAX 8.2% / 1.2%   PTSD (post-traumatic stress disorder) 2007 RIGHT HAND CRUSH INJURY W/ 2 FINGER AMPUTATION   Stroke ([place]) X2 CVA'S  IN 1990   LOSS OF VISION RIGHT Peripherial bilteral   Substance abuse ([place])    hx: narcotic use   UC (ulcerative colitis) (H[facility])    Weakness of right leg  CAUSED BY RETRIEVAL THIGH MUSCLE FOR FLAP RIGHT HAND INJURY-- 2006   RIGHT LEG GIVES OUT OCCASIONALLY   Past Surgical History:  Procedure Laterality Date   AMPUTATION Left 05/13/2017   Procedure: Left small finger revision amputation ;  Surgeon: Roseanne Kaufman, MD;  Location: [facility];  Service: Orthopedics;  Laterality: Left;  60 mins   CARPAL TUNNEL RELEASE Left 12/07/2020   Procedure: LEFT CARPAL TUNNEL RELEASE;  Surgeon: [name], MD;  Location: [facility];  Service: Orthopedics;  Laterality: Left;   COLON SURGERY     15 years ago- fistula on colon removed   COSMETIC SURGERY     right leg and abdomen   DILATATION & CURETTAGE/HYSTEROSCOPY WITH MYOSURE N/A 06/14/2016   Procedure: DILATATION & CURETTAGE/HYSTEROSCOPY WITH MYOSURE;  Surgeon: Anastasio Auerbach, MD;  Location: [facility];  Service: Gynecology;  Laterality: N/A;   HAND CARPECTOMY  12/24/2005   RIGHT-- INCLUDING  REMOVAL  WRIST WIRES AND I &D (EXICIOSNAL)  FOR OSTEOMYELITIS   HYSTEROSCOPY WITH D & C  08/18/2011   Procedure: DILATATION AND CURETTAGE (D&C) /HYSTEROSCOPY;  Surgeon: Anastasio Auerbach, MD;  Location: [facility];  Service: Gynecology;  Laterality:  N/A;   INCISION / DRAINAGE HAND / FINGER  X3  (2006 & 2007)   RIGHT CRUSH WOUND   LESION REMOVAL  08/18/2011   Procedure: EXCISION VAGINAL LESION;  Surgeon: Anastasio Auerbach, MD;  Location: [facility];  Service: Gynecology;  Laterality: N/A;   ORIF CARPAL BONE FRACTURE  08/31/2005   I & D OF RIGHT HAND CRUSH INJURY/ OPEN FX'S AND INDEX FINGER RAY RESECTION/ COMPLICATED CLOSURE   ORIF RADIAL FRACTURE  09/04/2005   DISTAL RADIAL PLATE AND PARTIAL THUMB AMPUTATION   RIGHT TOTAL WRIST FUSION W/ BONE GRAFT AND PLATE  76/73/4193   AND FLAP USING RIGHT GOIN MUSCLE   skin cancer removal  2007   throat area   TUBAL LIGATION  15 YRS AGO   Current Outpatient Medications on File Prior to Visit  Medication Sig Dispense Refill    ADVAIR HFA 45-21 MCG/ACT inhaler INHALE 2 PUFFS INTO THE LUNGS TWICE A DAY 12 each 7   albuterol (VENTOLIN HFA) 108 (90 Base) MCG/ACT inhaler TAKE 2 PUFFS BY MOUTH EVERY 6 HOURS AS NEEDED FOR WHEEZE OR SHORTNESS OF BREATH 8.5 each 2   EPINEPHrine 0.3 mg/0.3 mL IJ SOAJ injection Inject into the muscle.     gabapentin (NEURONTIN) 300 MG capsule TAKE 1 CAPSULE (300 MG TOTAL) BY MOUTH 3 (THREE) TIMES DAILY WITH MEALS AS NEEDED (NERVE PAIN). 90 capsule 3   linaclotide (LINZESS) 145 MCG CAPS capsule Take 1 capsule (145 mcg total) by mouth daily before breakfast. 30 capsule 11   nystatin (MYCOSTATIN) 100000 UNIT/ML suspension Take 5 mLs (500,000 Units total) by mouth 4 (four) times daily. 60 mL 0   pantoprazole (PROTONIX) 40 MG tablet TAKE 1 TABLET BY MOUTH EVERY DAY 90 tablet 1   Spacer/Aero-Holding Chambers (AEROCHAMBER MV) inhaler Use as instructed 1 each 0   No current facility-administered medications on file prior to visit.   Allergies  Allergen Reactions   Aspirin Shortness Of Breath    ASTHMATIC ATTACK   Breztri Aerosphere [Budeson-Glycopyrrol-Formoterol] Other (See Comments)    Urinary retention   Codeine Other (See Comments)    ABNORMAL BEHAVIOR  Increased in bad moods  NO CODEINE BASED MEDICATIONS    Vicodin [Hydrocodone-Acetaminophen] Other (See Comments)    Extreme anxiety   Social History   Socioeconomic History   Marital status: Married    Spouse name: Not on file   Number of children: 6   Years of education: Not on file   Highest education level: Not on file  Occupational History   Occupation: Housekeeper   Tobacco Use   Smoking status: Former    Packs/day: 1.50    Years: 28.00    Total pack years: 42.00    Types: Cigarettes    Quit date: 11/29/2001    Years since quitting: 20.4   Smokeless tobacco: Never  Vaping Use   Vaping Use: Never used  Substance and Sexual Activity   Alcohol use: Yes    Comment: wine daily   Drug use: Yes    Types: Marijuana     Comment: last smoked 12-08-20   Sexual activity: Yes    Partners: Male    Birth control/protection: Post-menopausal    Comment: 1st intercourse 61 yo-More than 5 partners  Other Topics Concern   Not on file  Social History Narrative   Lives with husband.     Social Determinants of Health   Financial Resource Strain: High Risk (10/11/2021)   Overall Financial Resource Strain (CARDIA)  Difficulty of Paying Living Expenses: Very hard  Food Insecurity: Food Insecurity Present (10/11/2021)   Hunger Vital Sign    Worried About Running Out of Food in the Last Year: Often true    Ran Out of Food in the Last Year: Often true  Transportation Needs: No Transportation Needs (10/07/2021)   PRAPARE - Hydrologist (Medical): No    Lack of Transportation (Non-Medical): No  Physical Activity: Insufficiently Active (10/07/2021)   Exercise Vital Sign    Days of Exercise per Week: 4 days    Minutes of Exercise per Session: 30 min  Stress: No Stress Concern Present (10/07/2021)   [place]    Feeling of Stress : Only a little  Social Connections: Moderately Isolated (10/07/2021)   Social Connection and Isolation Panel [NHANES]    Frequency of Communication with Friends and Family: More than three times a week    Frequency of Social Gatherings with Friends and Family: Once a week    Attends Religious Services: Never    Marine scientist or Organizations: No    Attends Archivist Meetings: Never    Marital Status: Married  Human resources officer Violence: Not At Risk (10/07/2021)   Humiliation, Afraid, Rape, and Kick questionnaire    Fear of Current or Ex-Partner: No    Emotionally Abused: No    Physically Abused: No    Sexually Abused: No     Review of Systems  Gastrointestinal:  Positive for abdominal pain.  All other systems reviewed and are negative.      Objective:   Physical  Exam Vitals reviewed. Exam conducted with a chaperone present.  Constitutional:      General: She is not in acute distress.    Appearance: Normal appearance. She is normal weight. She is not ill-appearing, toxic-appearing or diaphoretic.  HENT:     Nose: Nose normal. No congestion or rhinorrhea.     Mouth/Throat:     Pharynx: No oropharyngeal exudate or posterior oropharyngeal erythema.  Cardiovascular:     Rate and Rhythm: Normal rate and regular rhythm.     Pulses: Normal pulses.     Heart sounds: Normal heart sounds. No murmur heard.    No friction rub. No gallop.  Pulmonary:     Effort: Pulmonary effort is normal. No respiratory distress.     Breath sounds: Normal breath sounds. No stridor or decreased air movement. No wheezing, rhonchi or rales.  Abdominal:     General: Bowel sounds are normal. There is no distension.     Palpations: Abdomen is soft. There is no mass.     Tenderness: There is no abdominal tenderness. There is no guarding or rebound.     Hernia: No hernia is present.  Genitourinary:    General: Normal vulva.     Labia:        Right: No rash or tenderness.        Left: No rash or tenderness.      Vagina: Normal. No signs of injury. No vaginal discharge, tenderness or lesions.     Cervix: Cervical motion tenderness present. No cervical bleeding.     Uterus: Normal.      Adnexa:        Left: Tenderness present. No mass or fullness.    Neurological:     Mental Status: She is alert.           Assessment & Plan:  Pelvic pain Patient would like to try Bactrim double strength tablets twice daily for 1 week for possible urethritis as she does report some pulsing pain in the urethra every time she voids.  Urinalysis is unremarkable.  I will send gonorrhea and Chlamydia testing given the cervical motion tenderness.  If the antibiotics do not improve the discomfort and if the gonorrhea and Chlamydia testing is negative, I would recommend GYN consultation for pelvic  pain.  Patient may benefit from pelvic ultrasound at that point

## 2022-05-05 NOTE — Addendum Note (Signed)
Addended by: Colman Cater on: 05/05/2022 04:20 PM   Modules accepted: Orders

## 2022-05-05 NOTE — Addendum Note (Signed)
Addended by: Colman Cater on: 05/05/2022 04:45 PM   Modules accepted: Orders

## 2022-05-06 LAB — C. TRACHOMATIS/N. GONORRHOEAE RNA
C. trachomatis RNA, TMA: NOT DETECTED
N. gonorrhoeae RNA, TMA: NOT DETECTED

## 2022-05-10 ENCOUNTER — Encounter: Payer: Self-pay | Admitting: Nurse Practitioner

## 2022-05-10 ENCOUNTER — Ambulatory Visit: Payer: Medicare Other | Admitting: Nurse Practitioner

## 2022-05-10 VITALS — BP 120/80 | HR 78 | Resp 12 | Wt 135.0 lb

## 2022-05-10 DIAGNOSIS — R102 Pelvic and perineal pain: Secondary | ICD-10-CM

## 2022-05-10 DIAGNOSIS — N941 Unspecified dyspareunia: Secondary | ICD-10-CM | POA: Diagnosis not present

## 2022-05-10 NOTE — Progress Notes (Signed)
   Acute Office Visit  Subjective:    Patient ID: [name], female    DOB: Feb 07, 1961, 61 y.o.   MRN: [id number]   HPI 61 y.o. presents today for pelvic pain. Pain started 1-2 months ago, is worse at night with lying and gets better during the day. Pain is in mid lower abdomen, does not radiate, unable to describe. Sexually active, had significant pain with intercourse last week. Pain was in lower abdomen and lingered afterwards, no bleeding. Has not been sexually active since. Denies any vaginal bleeding.   Saw PCP 05/05/22 for same complaints with recommendations to see GYN. At that visit Bactrim was provided for possible urethritis. Negative UA, STD screening at that time. Reports no improvement in pain.   Saw GI in 2021 with recommendations for EGD and colonoscopy, not done by patient. Negative Cologuard 01/2022. H/O diverticulitis in 10/2021. Says pain is different than that. No changes in bowels, she does take Linzess.    Review of Systems  Constitutional: Negative.   Gastrointestinal: Negative.   Genitourinary:  Positive for dyspareunia and pelvic pain (Mid, lower). Negative for difficulty urinating, dysuria, flank pain, frequency, hematuria, vaginal bleeding, vaginal discharge and vaginal pain.       Objective:    Physical Exam Constitutional:      Appearance: Normal appearance.  Abdominal:     General: Abdomen is flat.     Palpations: Abdomen is soft.     Tenderness: There is no abdominal tenderness.  Genitourinary:    General: Normal vulva.     Vagina: Normal.     Cervix: Normal.     Uterus: Normal.      BP 120/80   Pulse 78   Resp 12   Wt 135 lb (61.2 kg)   BMI 24.69 kg/m  Wt Readings from Last 3 Encounters:  05/10/22 135 lb (61.2 kg)  05/05/22 137 lb (62.1 kg)  02/01/22 136 lb (61.7 kg)        Patient informed chaperone available to be present for breast and/or pelvic exam. Patient has requested no chaperone to be present. Patient has been advised  what will be completed during breast and pelvic exam.   Assessment & Plan:   Problem List Items Addressed This Visit   None Visit Diagnoses     Pelvic pain    -  Primary   Relevant Orders   US PELVIS TRANSVAGINAL NON-OB (TV ONLY)   Dyspareunia in female          Plan: Will schedule pelvic ultrasound for further evaluation. Negative exam today.      Tamela Gammon DNP, 11:48 AM 05/10/2022

## 2022-05-11 ENCOUNTER — Telehealth: Payer: Self-pay | Admitting: Family Medicine

## 2022-05-11 DIAGNOSIS — R102 Pelvic and perineal pain: Secondary | ICD-10-CM

## 2022-05-11 NOTE — Telephone Encounter (Signed)
Patient called to follow up on recent referral received to go to a GYN provider; stated they couldn't do anything to help her other than schedule an ultrasound in about 1 month. Patient prefers to be referred to urologist on [address] (referral needed for them to see her). Requesting call back.   Please advise at [phone].

## 2022-05-11 NOTE — Telephone Encounter (Signed)
Pls advice

## 2022-05-12 NOTE — Telephone Encounter (Signed)
Spoke w/pt this morning, pt aware that referral to urology has been placed. Told pt if she don't hear from them in a few days to give [place] a call. This way we can do a follow up.  Pt voiced understanding. Nothing further.

## 2022-06-13 DIAGNOSIS — H2513 Age-related nuclear cataract, bilateral: Secondary | ICD-10-CM | POA: Diagnosis not present

## 2022-06-14 ENCOUNTER — Ambulatory Visit (INDEPENDENT_AMBULATORY_CARE_PROVIDER_SITE_OTHER): Payer: Medicare Other | Admitting: Nurse Practitioner

## 2022-06-14 ENCOUNTER — Ambulatory Visit (INDEPENDENT_AMBULATORY_CARE_PROVIDER_SITE_OTHER): Payer: Medicare Other

## 2022-06-14 VITALS — BP 136/84

## 2022-06-14 DIAGNOSIS — R102 Pelvic and perineal pain: Secondary | ICD-10-CM | POA: Diagnosis not present

## 2022-06-14 DIAGNOSIS — R1030 Lower abdominal pain, unspecified: Secondary | ICD-10-CM

## 2022-06-14 NOTE — Progress Notes (Signed)
   Acute Office Visit  Subjective:    Patient ID: ZAHARAH AMIR, female    DOB: May 06, 1961, 61 y.o.   MRN: [id number]   HPI 61 y.o. G6P6 presents today for ultrasound follow up. Was started on vaginal estrogen by urology recently, still using daily right now. Has noticed slight improvement in pain but she feels it is bladder.   Visit note from 05/10/22: Presents today for pelvic pain. Pain started 1-2 months ago, is worse at night with lying and gets better during the day. Pain is in mid lower abdomen, does not radiate, unable to describe. Sexually active, had significant pain with intercourse last week. Pain was in lower abdomen and lingered afterwards, no bleeding. Has not been sexually active since. Denies any vaginal bleeding.    Saw PCP 05/05/22 for same complaints with recommendations to see GYN. At that visit Bactrim was provided for possible urethritis. Negative UA, STD screening at that time. Reports no improvement in pain.    Saw GI in 2021 with recommendations for EGD and colonoscopy, not done by patient. Negative Cologuard 01/2022. H/O diverticulitis in 10/2021. Says pain is different than that. No changes in bowels, she does take Linzess.   Negative exam at that visit.    Review of Systems  Constitutional: Negative.   Gastrointestinal:  Positive for abdominal pain.  Genitourinary:  Positive for dyspareunia.       Objective:    Physical Exam Constitutional:      Appearance: Normal appearance.     BP 136/84 (BP Location: Left Arm, Patient Position: Sitting, Cuff Size: Normal)  Wt Readings from Last 3 Encounters:  05/10/22 135 lb (61.2 kg)  05/05/22 137 lb (62.1 kg)  02/01/22 136 lb (61.7 kg)        Assessment & Plan:   Problem List Items Addressed This Visit   None Visit Diagnoses     Lower abdominal pain    -  Primary      Vaginal ultrasound:  Anteverted atrophic uterus, normal shape.  Slightly heterogeneous myometrium however no definitive myometrial  masses seen.  Thin, symmetrical endometrium measuring approximately 2.38 mm.  No obvious masses or thickening seen.  Both ovaries are atrophic in appearance, within normal limits, positive perfusion.  No adnexal masses, no free fluid.  Plan: Reviewed normal ultrasound findings. Recommend continuing vaginal estrogen and continuing follow ups with urology.      Tamela Gammon DNP, 4:06 PM 06/14/2022

## 2022-06-22 ENCOUNTER — Other Ambulatory Visit: Payer: Self-pay | Admitting: Family Medicine

## 2022-06-22 DIAGNOSIS — Z1231 Encounter for screening mammogram for malignant neoplasm of breast: Secondary | ICD-10-CM

## 2022-06-28 ENCOUNTER — Ambulatory Visit (INDEPENDENT_AMBULATORY_CARE_PROVIDER_SITE_OTHER): Payer: Medicare Other | Admitting: Family Medicine

## 2022-06-28 VITALS — BP 146/98 | HR 65 | Ht 62.0 in | Wt 142.8 lb

## 2022-06-28 DIAGNOSIS — I10 Essential (primary) hypertension: Secondary | ICD-10-CM | POA: Diagnosis not present

## 2022-06-28 MED ORDER — VALSARTAN 80 MG PO TABS: 80.0000 mg | ORAL_TABLET | Freq: Every day | ORAL | 3 refills | Status: DC

## 2022-06-28 NOTE — Progress Notes (Signed)
Subjective:    Patient ID: [name], female    DOB: 07/21/1961, 61 y.o.   MRN: [id number]  Patient states that she feels bad.  She just does not feel like doing anything.  She reports that she has very little energy.  She states that she has been feeling bad ever since she quit smoking pot several months ago.  She been checking her blood pressures were 140-150.  The majority of diastolic blood pressures are over 90.  She is also has some crazy high blood pressures that seem out of the realm of normal I question the accuracy of them.  She denies any chest pain.  She denies any shortness of breath.  She denies any orthopnea.  She has been taking a lot of NSAIDs recently due to her pelvic pain up to 8 ibuprofen a day.  She denies excessive use of caffeine or stimulants.  She also has a soft well-circumscribed mass in the center of her back roughly around the level of T6.  It is approximately 5 cm in diameter.  It is squishy but it feels like it has been fine wall so I believe this most likely a sebaceous cyst although I cannot exclude a lipoma.  She was concerned that this could be potentially adding to her blood pressure Past Medical History:  Diagnosis Date   Anxiety    Aortic atherosclerosis ([place])    Arthritis    Asthma    Bed bug bite    Blood transfusion without reported diagnosis    CA - skin cancer    Chronic pain due to injury RIGHT HAND/WRIST IN 7017   Complication of anesthesia    unable to urinate after surgery   COPD (chronic obstructive pulmonary disease) ([place])    Stage 1   Crush injury of hand RIGHT IN 2007   X 6 SURG'S  LAST ONE BEING TOTAL WRIST FUSION W/ BONE GRAFT AND PLATE   Cubital tunnel syndrome    Left elbow   Depression    ETOH abuse    GERD (gastroesophageal reflux disease)    05/11/17- not recently   Hemorrhoids    History of kidney stones    passed   MRSA (methicillin resistant Staphylococcus aureus) carrier    Neuromuscular disorder ([place])    some  fibromyalgia   Neuropathy    Osteopenia 07/2019   T score -1.9 FRAX 8.2% / 1.2%   PTSD (post-traumatic stress disorder) 2007 RIGHT HAND CRUSH INJURY W/ 2 FINGER AMPUTATION   Stroke ([place]) X2 CVA'S  IN 1990   LOSS OF VISION RIGHT Peripherial bilteral   Substance abuse ([place])    hx: narcotic use   UC (ulcerative colitis) ([facility])    Weakness of right leg CAUSED BY RETRIEVAL THIGH MUSCLE FOR FLAP RIGHT HAND INJURY-- 2006   RIGHT LEG GIVES OUT OCCASIONALLY   Past Surgical History:  Procedure Laterality Date   AMPUTATION Left 05/13/2017   Procedure: Left small finger revision amputation ;  Surgeon: Roseanne Kaufman, MD;  Location: [facility];  Service: Orthopedics;  Laterality: Left;  60 mins   CARPAL TUNNEL RELEASE Left 12/07/2020   Procedure: LEFT CARPAL TUNNEL RELEASE;  Surgeon: [name], MD;  Location: [facility];  Service: Orthopedics;  Laterality: Left;   COLON SURGERY     15 years ago- fistula on colon removed   COSMETIC SURGERY     right leg and abdomen   DILATATION & CURETTAGE/HYSTEROSCOPY WITH MYOSURE N/A 06/14/2016  Procedure: [place];  Surgeon: Anastasio Auerbach, MD;  Location: [facility];  Service: Gynecology;  Laterality: N/A;   HAND CARPECTOMY  12/24/2005   RIGHT-- INCLUDING  REMOVAL  WRIST WIRES AND I &D (EXICIOSNAL)  FOR OSTEOMYELITIS   HYSTEROSCOPY WITH D & C  08/18/2011   Procedure: DILATATION AND CURETTAGE (D&C) /HYSTEROSCOPY;  Surgeon: Anastasio Auerbach, MD;  Location: [facility];  Service: Gynecology;  Laterality: N/A;   INCISION / DRAINAGE HAND / FINGER  X3  (2006 & 2007)   RIGHT CRUSH WOUND   LESION REMOVAL  08/18/2011   Procedure: EXCISION VAGINAL LESION;  Surgeon: Anastasio Auerbach, MD;  Location: [facility];  Service: Gynecology;  Laterality: N/A;   ORIF CARPAL BONE FRACTURE  08/31/2005   I & D OF RIGHT HAND CRUSH INJURY/ OPEN FX'S AND INDEX FINGER RAY RESECTION/ COMPLICATED  CLOSURE   ORIF RADIAL FRACTURE  09/04/2005   DISTAL RADIAL PLATE AND PARTIAL THUMB AMPUTATION   RIGHT TOTAL WRIST FUSION W/ BONE GRAFT AND PLATE  59/93/5701   AND FLAP USING RIGHT GOIN MUSCLE   skin cancer removal  2007   throat area   TUBAL LIGATION  15 YRS AGO   Current Outpatient Medications on File Prior to Visit  Medication Sig Dispense Refill   ADVAIR HFA 45-21 MCG/ACT inhaler INHALE 2 PUFFS INTO THE LUNGS TWICE A DAY 12 each 7   albuterol (VENTOLIN HFA) 108 (90 Base) MCG/ACT inhaler TAKE 2 PUFFS BY MOUTH EVERY 6 HOURS AS NEEDED FOR WHEEZE OR SHORTNESS OF BREATH 8.5 each 2   EPINEPHrine 0.3 mg/0.3 mL IJ SOAJ injection Inject into the muscle.     estradiol (ESTRACE) 0.1 MG/GM vaginal cream Place vaginally.     linaclotide (LINZESS) 145 MCG CAPS capsule Take 1 capsule (145 mcg total) by mouth daily before breakfast. 30 capsule 11   nystatin (MYCOSTATIN) 100000 UNIT/ML suspension Take 5 mLs (500,000 Units total) by mouth 4 (four) times daily. 60 mL 0   pantoprazole (PROTONIX) 40 MG tablet TAKE 1 TABLET BY MOUTH EVERY DAY 90 tablet 1   Spacer/Aero-Holding Chambers (AEROCHAMBER MV) inhaler Use as instructed 1 each 0   No current facility-administered medications on file prior to visit.     Allergies  Allergen Reactions   Aspirin Shortness Of Breath    ASTHMATIC ATTACK   Breztri Aerosphere [Budeson-Glycopyrrol-Formoterol] Other (See Comments)    Urinary retention   Codeine Other (See Comments)    ABNORMAL BEHAVIOR  Increased in bad moods  NO CODEINE BASED MEDICATIONS    Vicodin [Hydrocodone-Acetaminophen] Other (See Comments)    Extreme anxiety   Social History   Socioeconomic History   Marital status: Married    Spouse name: Not on file   Number of children: 6   Years of education: Not on file   Highest education level: Not on file  Occupational History   Occupation: Housekeeper   Tobacco Use   Smoking status: Former    Packs/day: 1.50    Years: 28.00    Total  pack years: 42.00    Types: Cigarettes    Quit date: 11/29/2001    Years since quitting: 20.5   Smokeless tobacco: Never  Vaping Use   Vaping Use: Never used  Substance and Sexual Activity   Alcohol use: Yes    Comment: wine daily   Drug use: Yes    Types: Marijuana    Comment: last smoked 12-08-20   Sexual activity: Yes  Partners: Male    Birth control/protection: Post-menopausal    Comment: 1st intercourse 61 yo-More than 5 partners  Other Topics Concern   Not on file  Social History Narrative   Lives with husband.     Social Determinants of Health   Financial Resource Strain: High Risk (10/11/2021)   Overall Financial Resource Strain (CARDIA)    Difficulty of Paying Living Expenses: Very hard  Food Insecurity: Food Insecurity Present (10/11/2021)   Hunger Vital Sign    Worried About Running Out of Food in the Last Year: Often true    Ran Out of Food in the Last Year: Often true  Transportation Needs: No Transportation Needs (10/07/2021)   PRAPARE - Hydrologist (Medical): No    Lack of Transportation (Non-Medical): No  Physical Activity: Insufficiently Active (10/07/2021)   Exercise Vital Sign    Days of Exercise per Week: 4 days    Minutes of Exercise per Session: 30 min  Stress: No Stress Concern Present (10/07/2021)   [place]    Feeling of Stress : Only a little  Social Connections: Moderately Isolated (10/07/2021)   Social Connection and Isolation Panel [NHANES]    Frequency of Communication with Friends and Family: More than three times a week    Frequency of Social Gatherings with Friends and Family: Once a week    Attends Religious Services: Never    Marine scientist or Organizations: No    Attends Archivist Meetings: Never    Marital Status: Married  Human resources officer Violence: Not At Risk (10/07/2021)   Humiliation, Afraid, Rape, and Kick  questionnaire    Fear of Current or Ex-Partner: No    Emotionally Abused: No    Physically Abused: No    Sexually Abused: No     Review of Systems  Gastrointestinal:  Positive for abdominal pain.  All other systems reviewed and are negative.      Objective:   Physical Exam Vitals reviewed.  Constitutional:      General: She is not in acute distress.    Appearance: Normal appearance. She is normal weight. She is not ill-appearing, toxic-appearing or diaphoretic.  HENT:     Nose: Nose normal. No congestion or rhinorrhea.     Mouth/Throat:     Pharynx: No oropharyngeal exudate or posterior oropharyngeal erythema.  Cardiovascular:     Rate and Rhythm: Normal rate and regular rhythm.     Pulses: Normal pulses.     Heart sounds: Normal heart sounds. No murmur heard.    No friction rub. No gallop.  Pulmonary:     Effort: Pulmonary effort is normal. No respiratory distress.     Breath sounds: Normal breath sounds. No stridor or decreased air movement. No wheezing, rhonchi or rales.  Abdominal:     General: Bowel sounds are normal. There is no distension.     Palpations: Abdomen is soft. There is no mass.     Tenderness: There is no abdominal tenderness. There is no guarding or rebound.     Hernia: No hernia is present.  Musculoskeletal:       Back:  Neurological:     Mental Status: She is alert.           Assessment & Plan:  Benign essential HTN Patient has consistently elevated blood pressure, begin valsartan 80 mg daily and recheck blood pressure and a BMP in 2 weeks.  Recommended reducing  consumption of NSAIDs.  Wait to see how the patient feels after lowering her blood pressure.  If she continues to feel "bad" I will pursue further diagnostic work-up and check TSH, CBC, and depending upon symptoms further diagnostic testing

## 2022-07-02 ENCOUNTER — Other Ambulatory Visit: Payer: Self-pay | Admitting: Family Medicine

## 2022-07-04 DIAGNOSIS — M62838 Other muscle spasm: Secondary | ICD-10-CM | POA: Diagnosis not present

## 2022-07-04 DIAGNOSIS — M6289 Other specified disorders of muscle: Secondary | ICD-10-CM | POA: Diagnosis not present

## 2022-07-04 DIAGNOSIS — M6281 Muscle weakness (generalized): Secondary | ICD-10-CM | POA: Diagnosis not present

## 2022-07-12 ENCOUNTER — Ambulatory Visit (INDEPENDENT_AMBULATORY_CARE_PROVIDER_SITE_OTHER): Payer: Medicare Other | Admitting: Family Medicine

## 2022-07-12 ENCOUNTER — Encounter: Payer: Self-pay | Admitting: Family Medicine

## 2022-07-12 VITALS — BP 134/72 | HR 57 | Ht 62.0 in | Wt 144.2 lb

## 2022-07-12 DIAGNOSIS — I10 Essential (primary) hypertension: Secondary | ICD-10-CM

## 2022-07-12 NOTE — Progress Notes (Signed)
Subjective:    Patient ID: [name], female    DOB: [birth year], 61 y.o.   MRN: [id number] 06/28/22 Patient states that she feels bad.  She just does not feel like doing anything.  She reports that she has very little energy.  She states that she has been feeling bad ever since she quit smoking pot several months ago.  She been checking her blood pressures were 140-150.  The majority of diastolic blood pressures are over 90.  She is also has some crazy high blood pressures that seem out of the realm of normal I question the accuracy of them.  She denies any chest pain.  She denies any shortness of breath.  She denies any orthopnea.  She has been taking a lot of NSAIDs recently due to her pelvic pain up to 8 ibuprofen a day.  She denies excessive use of caffeine or stimulants.  She also has a soft well-circumscribed mass in the center of her back roughly around the level of T6.  It is approximately 5 cm in diameter.  It is squishy but it feels like it has a defined wall so I believe this most likely a sebaceous cyst although I cannot exclude a lipoma.  She was concerned that this could be potentially adding to her blood pressure.  AT that time, my plan was:  Patient has consistently elevated blood pressure, begin valsartan 80 mg daily and recheck blood pressure and a BMP in 2 weeks.  Recommended reducing consumption of NSAIDs.  Wait to see how the patient feels after lowering her blood pressure.  If she continues to feel "bad" I will pursue further diagnostic work-up and check TSH, CBC, and depending upon symptoms further diagnostic testing  07/12/22 Patient states that she feels better.  Her blood pressure is much better today.  She states that she has been checking it at home and has been seeing systolic blood pressures in the 130 range.  She denies any angioedema.  She denies any chest pain.  She denies any shortness of breath.  She is not having any further nausea or abdominal pain.  She states  that as long as she takes the [place] and stays regular she is not having any abdominal pain.  She denies feeling "bad".  Therefore she is back to her baseline.  She denies depression or anxiety  Past Medical History:  Diagnosis Date   Anxiety    Aortic atherosclerosis ([place])    Arthritis    Asthma    Bed bug bite    Blood transfusion without reported diagnosis    CA - skin cancer    Chronic pain due to injury RIGHT HAND/WRIST IN 9371   Complication of anesthesia    unable to urinate after surgery   COPD (chronic obstructive pulmonary disease) ([place])    Stage 1   Crush injury of hand RIGHT IN 2007   X 6 SURG'S  LAST ONE BEING TOTAL WRIST FUSION W/ BONE GRAFT AND PLATE   Cubital tunnel syndrome    Left elbow   Depression    ETOH abuse    GERD (gastroesophageal reflux disease)    05/11/17- not recently   Hemorrhoids    History of kidney stones    passed   MRSA (methicillin resistant Staphylococcus aureus) carrier    Neuromuscular disorder ([place])    some fibromyalgia   Neuropathy    Osteopenia 07/2019   T score -1.9 FRAX 8.2% / 1.2%   PTSD (post-traumatic  stress disorder) 2007 RIGHT HAND CRUSH INJURY W/ 2 FINGER AMPUTATION   Stroke ([place]) X2 CVA'S  IN 1990   LOSS OF VISION RIGHT Peripherial bilteral   Substance abuse ([place])    hx: narcotic use   UC (ulcerative colitis) ([facility])    Weakness of right leg CAUSED BY RETRIEVAL THIGH MUSCLE FOR FLAP RIGHT HAND INJURY-- 2006   RIGHT LEG GIVES OUT OCCASIONALLY   Past Surgical History:  Procedure Laterality Date   AMPUTATION Left 05/13/2017   Procedure: Left small finger revision amputation ;  Surgeon: Roseanne Kaufman, MD;  Location: [facility];  Service: Orthopedics;  Laterality: Left;  60 mins   CARPAL TUNNEL RELEASE Left 12/07/2020   Procedure: LEFT CARPAL TUNNEL RELEASE;  Surgeon: [name], MD;  Location: [facility];  Service: Orthopedics;  Laterality: Left;   COLON SURGERY     15 years ago- fistula on colon removed    COSMETIC SURGERY     right leg and abdomen   DILATATION & CURETTAGE/HYSTEROSCOPY WITH MYOSURE N/A 06/14/2016   Procedure: DILATATION & CURETTAGE/HYSTEROSCOPY WITH MYOSURE;  Surgeon: Anastasio Auerbach, MD;  Location: [facility];  Service: Gynecology;  Laterality: N/A;   HAND CARPECTOMY  12/24/2005   RIGHT-- INCLUDING  REMOVAL  WRIST WIRES AND I &D (EXICIOSNAL)  FOR OSTEOMYELITIS   HYSTEROSCOPY WITH D & C  08/18/2011   Procedure: DILATATION AND CURETTAGE (D&C) /HYSTEROSCOPY;  Surgeon: Anastasio Auerbach, MD;  Location: [facility];  Service: Gynecology;  Laterality: N/A;   INCISION / DRAINAGE HAND / FINGER  X3  (2006 & 2007)   RIGHT CRUSH WOUND   LESION REMOVAL  08/18/2011   Procedure: EXCISION VAGINAL LESION;  Surgeon: Anastasio Auerbach, MD;  Location: [facility];  Service: Gynecology;  Laterality: N/A;   ORIF CARPAL BONE FRACTURE  08/31/2005   I & D OF RIGHT HAND CRUSH INJURY/ OPEN FX'S AND INDEX FINGER RAY RESECTION/ COMPLICATED CLOSURE   ORIF RADIAL FRACTURE  09/04/2005   DISTAL RADIAL PLATE AND PARTIAL THUMB AMPUTATION   RIGHT TOTAL WRIST FUSION W/ BONE GRAFT AND PLATE  83/15/1761   AND FLAP USING RIGHT GOIN MUSCLE   skin cancer removal  2007   throat area   TUBAL LIGATION  15 YRS AGO   Current Outpatient Medications on File Prior to Visit  Medication Sig Dispense Refill   ADVAIR HFA 45-21 MCG/ACT inhaler INHALE 2 PUFFS INTO THE LUNGS TWICE A DAY 12 each 7   albuterol (VENTOLIN HFA) 108 (90 Base) MCG/ACT inhaler TAKE 2 PUFFS BY MOUTH EVERY 6 HOURS AS NEEDED FOR WHEEZE OR SHORTNESS OF BREATH 8.5 each 2   EPINEPHrine 0.3 mg/0.3 mL IJ SOAJ injection Inject into the muscle.     estradiol (ESTRACE) 0.1 MG/GM vaginal cream Place vaginally.     linaclotide (LINZESS) 145 MCG CAPS capsule Take 1 capsule (145 mcg total) by mouth daily before breakfast. 30 capsule 11   nystatin (MYCOSTATIN) 100000 UNIT/ML suspension Take 5 mLs (500,000 Units total) by mouth 4 (four)  times daily. 60 mL 0   pantoprazole (PROTONIX) 40 MG tablet TAKE 1 TABLET BY MOUTH EVERY DAY 90 tablet 1   Spacer/Aero-Holding Chambers (AEROCHAMBER MV) inhaler Use as instructed 1 each 0   valsartan (DIOVAN) 80 MG tablet Take 1 tablet (80 mg total) by mouth daily. 90 tablet 3   No current facility-administered medications on file prior to visit.     Allergies  Allergen Reactions   Aspirin Shortness  Of Breath    ASTHMATIC ATTACK   Breztri Aerosphere [Budeson-Glycopyrrol-Formoterol] Other (See Comments)    Urinary retention   Codeine Other (See Comments)    ABNORMAL BEHAVIOR  Increased in bad moods  NO CODEINE BASED MEDICATIONS    Vicodin [Hydrocodone-Acetaminophen] Other (See Comments)    Extreme anxiety   Social History   Socioeconomic History   Marital status: Married    Spouse name: Not on file   Number of children: 6   Years of education: Not on file   Highest education level: Not on file  Occupational History   Occupation: Housekeeper   Tobacco Use   Smoking status: Former    Packs/day: 1.50    Years: 28.00    Total pack years: 42.00    Types: Cigarettes    Quit date: 11/29/2001    Years since quitting: 20.6   Smokeless tobacco: Never  Vaping Use   Vaping Use: Never used  Substance and Sexual Activity   Alcohol use: Yes    Comment: wine daily   Drug use: Yes    Types: Marijuana    Comment: last smoked 12-08-20   Sexual activity: Yes    Partners: Male    Birth control/protection: Post-menopausal    Comment: 1st intercourse 61 yo-More than 5 partners  Other Topics Concern   Not on file  Social History Narrative   Lives with husband.     Social Determinants of Health   Financial Resource Strain: High Risk (10/11/2021)   Overall Financial Resource Strain (CARDIA)    Difficulty of Paying Living Expenses: Very hard  Food Insecurity: Food Insecurity Present (10/11/2021)   Hunger Vital Sign    Worried About Running Out of Food in the Last Year: Often true     Ran Out of Food in the Last Year: Often true  Transportation Needs: No Transportation Needs (10/07/2021)   PRAPARE - Hydrologist (Medical): No    Lack of Transportation (Non-Medical): No  Physical Activity: Insufficiently Active (10/07/2021)   Exercise Vital Sign    Days of Exercise per Week: 4 days    Minutes of Exercise per Session: 30 min  Stress: No Stress Concern Present (10/07/2021)   [place]    Feeling of Stress : Only a little  Social Connections: Moderately Isolated (10/07/2021)   Social Connection and Isolation Panel [NHANES]    Frequency of Communication with Friends and Family: More than three times a week    Frequency of Social Gatherings with Friends and Family: Once a week    Attends Religious Services: Never    Marine scientist or Organizations: No    Attends Archivist Meetings: Never    Marital Status: Married  Human resources officer Violence: Not At Risk (10/07/2021)   Humiliation, Afraid, Rape, and Kick questionnaire    Fear of Current or Ex-Partner: No    Emotionally Abused: No    Physically Abused: No    Sexually Abused: No     Review of Systems  Gastrointestinal:  Positive for abdominal pain.  All other systems reviewed and are negative.      Objective:   Physical Exam Vitals reviewed.  Constitutional:      General: She is not in acute distress.    Appearance: Normal appearance. She is normal weight. She is not ill-appearing, toxic-appearing or diaphoretic.  HENT:     Nose: Nose normal. No congestion or rhinorrhea.  Mouth/Throat:     Pharynx: No oropharyngeal exudate or posterior oropharyngeal erythema.  Cardiovascular:     Rate and Rhythm: Normal rate and regular rhythm.     Pulses: Normal pulses.     Heart sounds: Normal heart sounds. No murmur heard.    No friction rub. No gallop.  Pulmonary:     Effort: Pulmonary effort is normal. No  respiratory distress.     Breath sounds: Normal breath sounds. No stridor or decreased air movement. No wheezing, rhonchi or rales.  Abdominal:     General: Bowel sounds are normal. There is no distension.     Palpations: Abdomen is soft. There is no mass.     Tenderness: There is no abdominal tenderness. There is no guarding or rebound.     Hernia: No hernia is present.  Musculoskeletal:       Back:  Neurological:     Mental Status: She is alert.           Assessment & Plan:  Benign essential HTN - Plan: BASIC METABOLIC PANEL WITH GFR Blood pressure today is excellent.  Continue valsartan 80 mg a day.  Check BMP to monitor potassium and renal function and as long as stable I will make no additional changes

## 2022-07-13 LAB — BASIC METABOLIC PANEL WITH GFR
BUN: 12 mg/dL (ref 7–25)
CO2: 27 mmol/L (ref 20–32)
Calcium: 9.7 mg/dL (ref 8.6–10.4)
Chloride: 107 mmol/L (ref 98–110)
Creat: 0.8 mg/dL (ref 0.50–1.05)
Glucose, Bld: 84 mg/dL (ref 65–99)
Potassium: 4.7 mmol/L (ref 3.5–5.3)
Sodium: 141 mmol/L (ref 135–146)
eGFR: 83 mL/min/{1.73_m2} (ref >=60)

## 2022-07-28 ENCOUNTER — Ambulatory Visit: Payer: Medicare Other

## 2022-07-29 ENCOUNTER — Ambulatory Visit
Admission: RE | Admit: 2022-07-29 | Discharge: 2022-07-29 | Disposition: A | Discharge status: Home / Self Care | Payer: Medicare Other | Source: Ambulatory Visit | Attending: Family Medicine | Admitting: Family Medicine

## 2022-07-29 DIAGNOSIS — Z1231 Encounter for screening mammogram for malignant neoplasm of breast: Secondary | ICD-10-CM | POA: Diagnosis not present

## 2022-11-01 ENCOUNTER — Ambulatory Visit: Payer: Medicare Other | Admitting: Family Medicine

## 2022-11-03 ENCOUNTER — Ambulatory Visit (INDEPENDENT_AMBULATORY_CARE_PROVIDER_SITE_OTHER): Payer: Medicare Other | Admitting: Family Medicine

## 2022-11-03 ENCOUNTER — Encounter: Payer: Self-pay | Admitting: Family Medicine

## 2022-11-03 VITALS — BP 128/80 | HR 64 | Temp 97.5°F | Ht 62.0 in | Wt 154.0 lb

## 2022-11-03 DIAGNOSIS — J019 Acute sinusitis, unspecified: Secondary | ICD-10-CM | POA: Diagnosis not present

## 2022-11-03 DIAGNOSIS — B9689 Other specified bacterial agents as the cause of diseases classified elsewhere: Secondary | ICD-10-CM | POA: Diagnosis not present

## 2022-11-03 MED ORDER — AMOXICILLIN-POT CLAVULANATE 875-125 MG PO TABS: 1.0000 | ORAL_TABLET | Freq: Two times a day (BID) | ORAL | 0 refills | Status: DC

## 2022-11-03 NOTE — Assessment & Plan Note (Signed)
Symptoms consistent with bacterial sinusitis and resickening or worsening illness after 7 days. Will treat with Augmentin 875-114m BID for 10 days. May continue symptomatic management at home. Instructed to return to office if symptoms persist or worsen.

## 2022-11-03 NOTE — Progress Notes (Signed)
Acute Office Visit  Subjective:     Patient ID: [name], female    DOB: 1961/02/25, 62 y.o.   MRN: [id number]  Chief Complaint  Patient presents with   Acute Visit    Cough, sore throat (since Friday 2/16); hurts to cough (feels raw) - JBG\\\  Per pt not so much sore throat, her glands feesl swollen, face swollen, r-eyes    HPI Patient is in today for 6 days of mucopurulent cough, sinus congestion, headache, right sided sinus pressure and nasal congestion. Denies fever, chills, body aches, shortness of breath, wheezing. No known sick exposures or home covid test Has tried nyquil, dayquil, ibuprofen.  Review of Systems  All other systems reviewed and are negative.   Past Medical History:  Diagnosis Date   Anxiety    Aortic atherosclerosis ([place])    Arthritis    Asthma    Bed bug bite    Blood transfusion without reported diagnosis    CA - skin cancer    Chronic pain due to injury RIGHT HAND/WRIST IN [id number]   Complication of anesthesia    unable to urinate after surgery   COPD (chronic obstructive pulmonary disease) ([place])    Stage 1   Crush injury of hand RIGHT IN 2007   X 6 SURG'S  LAST ONE BEING TOTAL WRIST FUSION W/ BONE GRAFT AND PLATE   Cubital tunnel syndrome    Left elbow   Depression    ETOH abuse    GERD (gastroesophageal reflux disease)    05/11/17- not recently   Hemorrhoids    History of kidney stones    passed   MRSA (methicillin resistant Staphylococcus aureus) carrier    Neuromuscular disorder ([place])    some fibromyalgia   Neuropathy    Osteopenia 07/2019   T score -1.9 FRAX 8.2% / 1.2%   PTSD (post-traumatic stress disorder) 2007 RIGHT HAND CRUSH INJURY W/ 2 FINGER AMPUTATION   Stroke ([place]) X2 CVA'S  IN 1990   LOSS OF VISION RIGHT Peripherial bilteral   Substance abuse ([place])    hx: narcotic use   UC (ulcerative colitis) ([facility])    Weakness of right leg CAUSED BY RETRIEVAL THIGH MUSCLE FOR FLAP RIGHT HAND INJURY-- 2006   RIGHT LEG GIVES  OUT OCCASIONALLY   Past Surgical History:  Procedure Laterality Date   AMPUTATION Left 05/13/2017   Procedure: Left small finger revision amputation ;  Surgeon: Roseanne Kaufman, MD;  Location: [facility];  Service: Orthopedics;  Laterality: Left;  60 mins   CARPAL TUNNEL RELEASE Left 12/07/2020   Procedure: LEFT CARPAL TUNNEL RELEASE;  Surgeon: [name], MD;  Location: [facility];  Service: Orthopedics;  Laterality: Left;   COLON SURGERY     15 years ago- fistula on colon removed   COSMETIC SURGERY     right leg and abdomen   DILATATION & CURETTAGE/HYSTEROSCOPY WITH MYOSURE N/A 06/14/2016   Procedure: DILATATION & CURETTAGE/HYSTEROSCOPY WITH MYOSURE;  Surgeon: Anastasio Auerbach, MD;  Location: [facility];  Service: Gynecology;  Laterality: N/A;   HAND CARPECTOMY  12/24/2005   RIGHT-- INCLUDING  REMOVAL  WRIST WIRES AND I &D (EXICIOSNAL)  FOR OSTEOMYELITIS   HYSTEROSCOPY WITH D & C  08/18/2011   Procedure: DILATATION AND CURETTAGE (D&C) /HYSTEROSCOPY;  Surgeon: Anastasio Auerbach, MD;  Location: [facility];  Service: Gynecology;  Laterality: N/A;   INCISION / DRAINAGE HAND / FINGER  X3  (2006 & 2007)   RIGHT  CRUSH WOUND   LESION REMOVAL  08/18/2011   Procedure: EXCISION VAGINAL LESION;  Surgeon: Anastasio Auerbach, MD;  Location: [facility];  Service: Gynecology;  Laterality: N/A;   ORIF CARPAL BONE FRACTURE  08/31/2005   I & D OF RIGHT HAND CRUSH INJURY/ OPEN FX'S AND INDEX FINGER RAY RESECTION/ COMPLICATED CLOSURE   ORIF RADIAL FRACTURE  09/04/2005   DISTAL RADIAL PLATE AND PARTIAL THUMB AMPUTATION   RIGHT TOTAL WRIST FUSION W/ BONE GRAFT AND PLATE  075-GRM   AND FLAP USING RIGHT GOIN MUSCLE   skin cancer removal  2007   throat area   TUBAL LIGATION  15 YRS AGO   Current Outpatient Medications on File Prior to Visit  Medication Sig Dispense Refill   ADVAIR HFA 45-21 MCG/ACT inhaler INHALE 2 PUFFS INTO THE LUNGS TWICE A DAY 12 each 7    albuterol (VENTOLIN HFA) 108 (90 Base) MCG/ACT inhaler TAKE 2 PUFFS BY MOUTH EVERY 6 HOURS AS NEEDED FOR WHEEZE OR SHORTNESS OF BREATH 8.5 each 2   EPINEPHrine 0.3 mg/0.3 mL IJ SOAJ injection Inject into the muscle.     estradiol (ESTRACE) 0.1 MG/GM vaginal cream Place vaginally.     linaclotide (LINZESS) 145 MCG CAPS capsule Take 1 capsule (145 mcg total) by mouth daily before breakfast. 30 capsule 11   pantoprazole (PROTONIX) 40 MG tablet TAKE 1 TABLET BY MOUTH EVERY DAY 90 tablet 1   Spacer/Aero-Holding Chambers (AEROCHAMBER MV) inhaler Use as instructed 1 each 0   valsartan (DIOVAN) 80 MG tablet Take 1 tablet (80 mg total) by mouth daily. 90 tablet 3   No current facility-administered medications on file prior to visit.   Allergies  Allergen Reactions   Aspirin Shortness Of Breath    ASTHMATIC ATTACK   Breztri Aerosphere [Budeson-Glycopyrrol-Formoterol] Other (See Comments)    Urinary retention   Codeine Other (See Comments)    ABNORMAL BEHAVIOR  Increased in bad moods  NO CODEINE BASED MEDICATIONS    Vicodin [Hydrocodone-Acetaminophen] Other (See Comments)    Extreme anxiety       Objective:    BP 128/80   Pulse 64   Temp (!) 97.5 F (36.4 C) (Oral)   Ht 5' 2"$  (1.575 m)   Wt 154 lb (69.9 kg)   SpO2 97%   BMI 28.17 kg/m    Physical Exam Vitals and nursing note reviewed.  Constitutional:      Appearance: Normal appearance. She is normal weight.  HENT:     Head: Normocephalic and atraumatic.     Nose: Congestion present.     Right Sinus: Maxillary sinus tenderness and frontal sinus tenderness present.     Mouth/Throat:     Pharynx: Posterior oropharyngeal erythema present.  Eyes:     Conjunctiva/sclera: Conjunctivae normal.  Cardiovascular:     Rate and Rhythm: Normal rate and regular rhythm.     Pulses: Normal pulses.     Heart sounds: Normal heart sounds.  Pulmonary:     Effort: Pulmonary effort is normal.     Breath sounds: Normal breath sounds.  Skin:     General: Skin is warm and dry.  Neurological:     General: No focal deficit present.     Mental Status: She is alert and oriented to person, place, and time. Mental status is at baseline.  Psychiatric:        Mood and Affect: Mood normal.        Behavior: Behavior normal.  Thought Content: Thought content normal.        Judgment: Judgment normal.     No results found for any visits on 11/03/22.      Assessment & Plan:   Problem List Items Addressed This Visit       Respiratory   Acute bacterial sinusitis - Primary    Symptoms consistent with bacterial sinusitis and resickening or worsening illness after 7 days. Will treat with Augmentin 875-114m BID for 10 days. May continue symptomatic management at home. Instructed to return to office if symptoms persist or worsen.      Relevant Medications   amoxicillin-clavulanate (AUGMENTIN) 875-125 MG tablet    Meds ordered this encounter  Medications   amoxicillin-clavulanate (AUGMENTIN) 875-125 MG tablet    Sig: Take 1 tablet by mouth 2 (two) times daily.    Dispense:  20 tablet    Refill:  0    Order Specific Question:   Supervising Provider    Answer:   PJenna LuoT [[id number]   Return if symptoms worsen or fail to improve.  ARubie Maid FNP

## 2022-11-10 ENCOUNTER — Ambulatory Visit (INDEPENDENT_AMBULATORY_CARE_PROVIDER_SITE_OTHER): Payer: Medicare Other

## 2022-11-10 VITALS — Ht 62.0 in | Wt 154.0 lb

## 2022-11-10 DIAGNOSIS — Z0000 Encounter for general adult medical examination without abnormal findings: Secondary | ICD-10-CM | POA: Diagnosis not present

## 2022-11-10 NOTE — Patient Instructions (Addendum)
[name] , Thank you for taking time to come for your Medicare Wellness Visit. I appreciate your ongoing commitment to your health goals. Please review the following plan we discussed and let me know if I can assist you in the future.   These are the goals we discussed:  Goals      Exercise 3x per week (30 min per time)     Try to move as tolerated. Try to eat healthy.        This is a list of the screening recommended for you and due dates:  Health Maintenance  Topic Date Due   Zoster (Shingles) Vaccine (1 of 2) Never done   COVID-19 Vaccine (2 - 2023-24 season) 05/13/2022   Colon Cancer Screening  10/31/2022   Pap Smear  07/31/2023   DTaP/Tdap/Td vaccine (6 - Td or Tdap) 08/21/2023   Medicare Annual Wellness Visit  11/10/2023   Mammogram  07/29/2024   Flu Shot  Completed   Hepatitis C Screening: USPSTF Recommendation to screen - Ages 18-79 yo.  Completed   HIV Screening  Completed   HPV Vaccine  Aged Out    Advanced directives: Advance directive discussed with you today. I have provided a copy for you to complete at home and have notarized. Once this is complete please bring a copy in to our office so we can scan it into your chart.   Conditions/risks identified: Aim for 30 minutes of exercise or brisk walking, 6-8 glasses of water, and 5 servings of fruits and vegetables each day.   Next appointment: Follow up in one year for your annual wellness visit.   Preventive Care 40-64 Years, Female Preventive care refers to lifestyle choices and visits with your health care provider that can promote health and wellness. What does preventive care include? A yearly physical exam. This is also called an annual well check. Dental exams once or twice a year. Routine eye exams. Ask your health care provider how often you should have your eyes checked. Personal lifestyle choices, including: Daily care of your teeth and gums. Regular physical activity. Eating a healthy diet. Avoiding  tobacco and drug use. Limiting alcohol use. Practicing safe sex. Taking low-dose aspirin daily starting at age 43. Taking vitamin and mineral supplements as recommended by your health care provider. What happens during an annual well check? The services and screenings done by your health care provider during your annual well check will depend on your age, overall health, lifestyle risk factors, and family history of disease. Counseling  Your health care provider may ask you questions about your: Alcohol use. Tobacco use. Drug use. Emotional well-being. Home and relationship well-being. Sexual activity. Eating habits. Work and work Statistician. Method of birth control. Menstrual cycle. Pregnancy history. Screening  You may have the following tests or measurements: Height, weight, and BMI. Blood pressure. Lipid and cholesterol levels. These may be checked every 5 years, or more frequently if you are over 61 years old. Skin check. Lung cancer screening. You may have this screening every year starting at age 67 if you have a 30-pack-year history of smoking and currently smoke or have quit within the past 15 years. Fecal occult blood test (FOBT) of the stool. You may have this test every year starting at age 24. Flexible sigmoidoscopy or colonoscopy. You may have a sigmoidoscopy every 5 years or a colonoscopy every 10 years starting at age 47. Hepatitis C blood test. Hepatitis B blood test. Sexually transmitted disease (STD) testing. Diabetes  screening. This is done by checking your blood sugar (glucose) after you have not eaten for a while (fasting). You may have this done every 1-3 years. Mammogram. This may be done every 1-2 years. Talk to your health care provider about when you should start having regular mammograms. This may depend on whether you have a family history of breast cancer. BRCA-related cancer screening. This may be done if you have a family history of breast, ovarian,  tubal, or peritoneal cancers. Pelvic exam and Pap test. This may be done every 3 years starting at age 41. Starting at age 34, this may be done every 5 years if you have a Pap test in combination with an HPV test. Bone density scan. This is done to screen for osteoporosis. You may have this scan if you are at high risk for osteoporosis. Discuss your test results, treatment options, and if necessary, the need for more tests with your health care provider. Vaccines  Your health care provider may recommend certain vaccines, such as: Influenza vaccine. This is recommended every year. Tetanus, diphtheria, and acellular pertussis (Tdap, Td) vaccine. You may need a Td booster every 10 years. Zoster vaccine. You may need this after age 82. Pneumococcal 13-valent conjugate (PCV13) vaccine. You may need this if you have certain conditions and were not previously vaccinated. Pneumococcal polysaccharide (PPSV23) vaccine. You may need one or two doses if you smoke cigarettes or if you have certain conditions. Talk to your health care provider about which screenings and vaccines you need and how often you need them. This information is not intended to replace advice given to you by your health care provider. Make sure you discuss any questions you have with your health care provider. Document Released: 09/25/2015 Document Revised: 05/18/2016 Document Reviewed: 06/30/2015 Elsevier Interactive Patient Education  2017 [place] Prevention in the Home Falls can cause injuries. They can happen to people of all ages. There are many things you can do to make your home safe and to help prevent falls. What can I do on the outside of my home? Regularly fix the edges of walkways and driveways and fix any cracks. Remove anything that might make you trip as you walk through a door, such as a raised step or threshold. Trim any bushes or trees on the path to your home. Use bright outdoor lighting. Clear any  walking paths of anything that might make someone trip, such as rocks or tools. Regularly check to see if handrails are loose or broken. Make sure that both sides of any steps have handrails. Any raised decks and porches should have guardrails on the edges. Have any leaves, snow, or ice cleared regularly. Use sand or salt on walking paths during winter. Clean up any spills in your garage right away. This includes oil or grease spills. What can I do in the bathroom? Use night lights. Install grab bars by the toilet and in the tub and shower. Do not use towel bars as grab bars. Use non-skid mats or decals in the tub or shower. If you need to sit down in the shower, use a plastic, non-slip stool. Keep the floor dry. Clean up any water that spills on the floor as soon as it happens. Remove soap buildup in the tub or shower regularly. Attach bath mats securely with double-sided non-slip rug tape. Do not have throw rugs and other things on the floor that can make you trip. What can I do in the  bedroom? Use night lights. Make sure that you have a light by your bed that is easy to reach. Do not use any sheets or blankets that are too big for your bed. They should not hang down onto the floor. Have a firm chair that has side arms. You can use this for support while you get dressed. Do not have throw rugs and other things on the floor that can make you trip. What can I do in the kitchen? Clean up any spills right away. Avoid walking on wet floors. Keep items that you use a lot in easy-to-reach places. If you need to reach something above you, use a strong step stool that has a grab bar. Keep electrical cords out of the way. Do not use floor polish or wax that makes floors slippery. If you must use wax, use non-skid floor wax. Do not have throw rugs and other things on the floor that can make you trip. What can I do with my stairs? Do not leave any items on the stairs. Make sure that there are  handrails on both sides of the stairs and use them. Fix handrails that are broken or loose. Make sure that handrails are as long as the stairways. Check any carpeting to make sure that it is firmly attached to the stairs. Fix any carpet that is loose or worn. Avoid having throw rugs at the top or bottom of the stairs. If you do have throw rugs, attach them to the floor with carpet tape. Make sure that you have a light switch at the top of the stairs and the bottom of the stairs. If you do not have them, ask someone to add them for you. What else can I do to help prevent falls? Wear shoes that: Do not have high heels. Have rubber bottoms. Are comfortable and fit you well. Are closed at the toe. Do not wear sandals. If you use a stepladder: Make sure that it is fully opened. Do not climb a closed stepladder. Make sure that both sides of the stepladder are locked into place. Ask someone to hold it for you, if possible. Clearly mark and make sure that you can see: Any grab bars or handrails. First and last steps. Where the edge of each step is. Use tools that help you move around (mobility aids) if they are needed. These include: Canes. Walkers. Scooters. Crutches. Turn on the lights when you go into a dark area. Replace any light bulbs as soon as they burn out. Set up your furniture so you have a clear path. Avoid moving your furniture around. If any of your floors are uneven, fix them. If there are any pets around you, be aware of where they are. Review your medicines with your doctor. Some medicines can make you feel dizzy. This can increase your chance of falling. Ask your doctor what other things that you can do to help prevent falls. This information is not intended to replace advice given to you by your health care provider. Make sure you discuss any questions you have with your health care provider. Document Released: 06/25/2009 Document Revised: 02/04/2016 Document Reviewed:  10/03/2014 Elsevier Interactive Patient Education  2017 [employer].

## 2022-11-10 NOTE — Progress Notes (Signed)
Subjective:   [name] is a 62 y.o. female who presents for Medicare Annual (Subsequent) preventive examination.  I connected with  [name] on 11/10/22 by a audio enabled telemedicine application and verified that I am speaking with the correct person using two identifiers.  Patient Location: Home  Provider Location: Office/Clinic  I discussed the limitations of evaluation and management by telemedicine. The patient expressed understanding and agreed to proceed.  Review of Systems     Cardiac Risk Factors include: advanced age (>48mn, >>61women);sedentary lifestyle;dyslipidemia     Objective:    Today's Vitals   11/10/22 0952  Weight: 154 lb (69.9 kg)  Height: '5\' 2"'$  (1.575 m)   Body mass index is 28.17 kg/m.     11/10/2022    9:55 AM 10/07/2021    3:43 PM 12/07/2020   10:00 AM 12/01/2020   10:59 AM 05/13/2017    7:46 AM 06/17/2016   12:22 PM 06/07/2016   10:07 AM  Advanced Directives  Does Patient Have a Medical Advance Directive? No No No No Yes No No  Type of Advance Directive     Living will    Does patient want to make changes to medical advance directive?     No - Patient declined    Would patient like information on creating a medical advance directive? Yes (MAU/Ambulatory/Procedural Areas - Information given) No - Patient declined No - Patient declined   No - patient declined information Yes - Educational materials given    Current Medications (verified) Outpatient Encounter Medications as of 11/10/2022  Medication Sig   ADVAIR HFA 45-21 MCG/ACT inhaler INHALE 2 PUFFS INTO THE LUNGS TWICE A DAY   albuterol (VENTOLIN HFA) 108 (90 Base) MCG/ACT inhaler TAKE 2 PUFFS BY MOUTH EVERY 6 HOURS AS NEEDED FOR WHEEZE OR SHORTNESS OF BREATH   amoxicillin-clavulanate (AUGMENTIN) 875-125 MG tablet Take 1 tablet by mouth 2 (two) times daily.   EPINEPHrine 0.3 mg/0.3 mL IJ SOAJ injection Inject into the muscle.   estradiol (ESTRACE) 0.1 MG/GM vaginal cream Place  vaginally.   linaclotide (LINZESS) 145 MCG CAPS capsule Take 1 capsule (145 mcg total) by mouth daily before breakfast.   pantoprazole (PROTONIX) 40 MG tablet TAKE 1 TABLET BY MOUTH EVERY DAY   Spacer/Aero-Holding Chambers (AEROCHAMBER MV) inhaler Use as instructed   valsartan (DIOVAN) 80 MG tablet Take 1 tablet (80 mg total) by mouth daily.   No facility-administered encounter medications on file as of 11/10/2022.    Allergies (verified) Aspirin, Breztri aerosphere [budeson-glycopyrrol-formoterol], Codeine, and Vicodin [hydrocodone-acetaminophen]   History: Past Medical History:  Diagnosis Date   Anxiety    [place]    Arthritis    Asthma    Bed bug bite    Blood transfusion without reported diagnosis    CA - skin cancer    Chronic pain due to injury RIGHT HAND/WRIST IN [id number]  Complication of anesthesia    unable to urinate after surgery   [place]    Stage 1   Crush injury of hand RIGHT IN 2007   X 6 SURG'S  LAST ONE BEING TOTAL WRIST FUSION W/ BONE GRAFT AND PLATE   Cubital tunnel syndrome    Left elbow   Depression    ETOH abuse    GERD (gastroesophageal reflux disease)    05/11/17- not recently   Hemorrhoids    History of kidney stones    passed   MRSA (methicillin resistant  Staphylococcus aureus) carrier    Neuromuscular disorder ([place])    some fibromyalgia   Neuropathy    Osteopenia 07/2019   T score -1.9 FRAX 8.2% / 1.2%   PTSD (post-traumatic stress disorder) 2007 RIGHT HAND CRUSH INJURY W/ 2 FINGER AMPUTATION   Stroke ([place]) X2 CVA'S  IN 1990   LOSS OF VISION RIGHT Peripherial bilteral   Substance abuse ([place])    hx: narcotic use   UC (ulcerative colitis) ([facility])    Weakness of right leg CAUSED BY RETRIEVAL THIGH MUSCLE FOR FLAP RIGHT HAND INJURY-- 2006   RIGHT LEG GIVES OUT OCCASIONALLY   Past Surgical History:  Procedure Laterality Date   AMPUTATION Left 05/13/2017   Procedure: Left small finger  revision amputation ;  Surgeon: Roseanne Kaufman, MD;  Location: [place];  Service: Orthopedics;  Laterality: Left;  60 mins   CARPAL TUNNEL RELEASE Left 12/07/2020   Procedure: LEFT CARPAL TUNNEL RELEASE;  Surgeon: [name], MD;  Location: [place];  Service: Orthopedics;  Laterality: Left;   COLON SURGERY     15 years ago- fistula on colon removed   COSMETIC SURGERY     right leg and abdomen   DILATATION & CURETTAGE/HYSTEROSCOPY WITH MYOSURE N/A 06/14/2016   Procedure: DILATATION & CURETTAGE/HYSTEROSCOPY WITH MYOSURE;  Surgeon: Anastasio Auerbach, MD;  Location: [facility];  Service: Gynecology;  Laterality: N/A;   HAND CARPECTOMY  12/24/2005   RIGHT-- INCLUDING  REMOVAL  WRIST WIRES AND I &D (EXICIOSNAL)  FOR OSTEOMYELITIS   HYSTEROSCOPY WITH D & C  08/18/2011   Procedure: DILATATION AND CURETTAGE (D&C) /HYSTEROSCOPY;  Surgeon: Anastasio Auerbach, MD;  Location: [place];  Service: Gynecology;  Laterality: N/A;   INCISION / DRAINAGE HAND / FINGER  X3  (2006 & 2007)   RIGHT CRUSH WOUND   LESION REMOVAL  08/18/2011   Procedure: EXCISION VAGINAL LESION;  Surgeon: Anastasio Auerbach, MD;  Location: [place];  Service: Gynecology;  Laterality: N/A;   ORIF CARPAL BONE FRACTURE  08/31/2005   I & D OF RIGHT HAND CRUSH INJURY/ OPEN FX'S AND INDEX FINGER RAY RESECTION/ COMPLICATED CLOSURE   ORIF RADIAL FRACTURE  09/04/2005   DISTAL RADIAL PLATE AND PARTIAL THUMB AMPUTATION   RIGHT TOTAL WRIST FUSION W/ BONE GRAFT AND PLATE  075-GRM   AND FLAP USING RIGHT GOIN MUSCLE   skin cancer removal  2007   throat area   TUBAL LIGATION  71 YRS AGO   Family History  Problem Relation Age of Onset   Lupus Mother        died at 46   Breast cancer Maternal Aunt 35   Breast cancer Maternal Grandmother 60   Stomach cancer Paternal Grandfather    Colon cancer Neg Hx    Rectal cancer Neg Hx    Esophageal cancer Neg Hx    Inflammatory bowel disease Neg Hx     Liver disease Neg Hx    Pancreatic cancer Neg Hx    Social History   Socioeconomic History   Marital status: Married    Spouse name: Not on file   Number of children: 6   Years of education: Not on file   Highest education level: Not on file  Occupational History   Occupation: Housekeeper   Tobacco Use   Smoking status: Former    Packs/day: 1.50    Years: 28.00    Total pack years: 42.00    Types: Cigarettes  Quit date: 11/29/2001    Years since quitting: 20.9   Smokeless tobacco: Never  Vaping Use   Vaping Use: Never used  Substance and Sexual Activity   Alcohol use: Yes    Comment: wine daily   Drug use: Yes    Types: Marijuana    Comment: last smoked 12-08-20   Sexual activity: Yes    Partners: Male    Birth control/protection: Post-menopausal    Comment: 1st intercourse 62 yo-More than 5 partners  Other Topics Concern   Not on file  Social History Narrative   Lives with husband.     Social Determinants of Health   Financial Resource Strain: Low Risk  (11/10/2022)   Overall Financial Resource Strain (CARDIA)    Difficulty of Paying Living Expenses: Not very hard  Food Insecurity: No Food Insecurity (11/10/2022)   Hunger Vital Sign    Worried About Running Out of Food in the Last Year: Never true    Ran Out of Food in the Last Year: Never true  Transportation Needs: No Transportation Needs (11/10/2022)   PRAPARE - Hydrologist (Medical): No    Lack of Transportation (Non-Medical): No  Physical Activity: Insufficiently Active (11/10/2022)   Exercise Vital Sign    Days of Exercise per Week: 3 days    Minutes of Exercise per Session: 30 min  Stress: No Stress Concern Present (11/10/2022)   Elim    Feeling of Stress : Not at all  Social Connections: Moderately Isolated (11/10/2022)   Social Connection and Isolation Panel [NHANES]    Frequency of Communication  with Friends and Family: More than three times a week    Frequency of Social Gatherings with Friends and Family: Once a week    Attends Religious Services: Never    Marine scientist or Organizations: No    Attends Music therapist: Never    Marital Status: Married    Tobacco Counseling Counseling given: Not Answered   Clinical Intake:  Pre-visit preparation completed: Yes  Pain : No/denies pain  Diabetes: No  How often do you need to have someone help you when you read instructions, pamphlets, or other written materials from your doctor or pharmacy?: 1 - Never  Diabetic?No   Interpreter Needed?: No  Information entered by :: Denman George LPN   Activities of Daily Living    11/10/2022    9:55 AM  In your present state of health, do you have any difficulty performing the following activities:  Hearing? 0  Vision? 0  Difficulty concentrating or making decisions? 0  Walking or climbing stairs? 0  Dressing or bathing? 0  Doing errands, shopping? 0  Preparing Food and eating ? N  Using the Toilet? N  In the past six months, have you accidently leaked urine? N  Do you have problems with loss of bowel control? N  Managing your Medications? N  Managing your Finances? N  Housekeeping or managing your Housekeeping? N    Patient Care Team: Susy Frizzle, MD as PCP - General (Family Medicine) [facility]  Indicate any recent Medical Services you may have received from other than [facility] providers in the past year (date may be approximate).     Assessment:   This is a routine wellness examination for Garie.  Hearing/Vision screen Hearing Screening - Comments:: Denies hearing difficulty  Vision Screening - Comments:: Wears rx glasses -  up to date with routine eye exams with [facility]    Dietary issues and exercise activities discussed: Current Exercise Habits: Home exercise routine, Type of exercise: walking, Time  (Minutes): 30, Frequency (Times/Week): 3, Weekly Exercise (Minutes/Week): 90, Intensity: Mild   Goals Addressed   None    Depression Screen    11/10/2022    9:54 AM 11/03/2022   10:00 AM 07/12/2022   11:27 AM 06/28/2022    3:08 PM 10/07/2021    3:39 PM 11/12/2020    2:17 PM 11/16/2018    3:51 PM  PHQ 2/9 Scores  PHQ - 2 Score 0 0 0 0 0 0 0  PHQ- 9 Score 0 2         Fall Risk    11/10/2022    9:53 AM 11/03/2022    9:52 AM 06/28/2022    3:09 PM 10/07/2021    3:44 PM 11/12/2020    2:17 PM  Fall Risk   Falls in the past year? 0 0 0 0 0  Number falls in past yr: 0 0 0 0   Injury with Fall? 0 0 0 0   Risk for fall due to : No Fall Risks  No Fall Risks Impaired mobility No Fall Risks  Follow up Falls prevention discussed  Falls prevention discussed Falls prevention discussed Falls evaluation completed    FALL RISK PREVENTION PERTAINING TO THE HOME:  Any stairs in or around the home? No  If so, are there any without handrails? No  Home free of loose throw rugs in walkways, pet beds, electrical cords, etc? Yes  Adequate lighting in your home to reduce risk of falls? Yes   ASSISTIVE DEVICES UTILIZED TO PREVENT FALLS:  Life alert? No  Use of a cane, walker or w/c? No  Grab bars in the bathroom? Yes  Shower chair or bench in shower? No  Elevated toilet seat or a handicapped toilet? Yes   TIMED UP AND GO:  Was the test performed? No . Telephonic visit   Cognitive Function:        11/10/2022    9:55 AM 10/07/2021    3:47 PM  6CIT Screen  What Year? 0 points 0 points  What month? 0 points 0 points  What time? 0 points 0 points  Count back from 20 0 points 0 points  Months in reverse 0 points 0 points  Repeat phrase 0 points 2 points  Total Score 0 points 2 points    Immunizations Immunization History  Administered Date(s) Administered   DTaP 10/23/1959, 11/27/1959, 01/01/1960, 12/02/1960   IPV 10/23/1959, 11/27/1959, 06/28/1960   Influenza,inj,Quad PF,6+ Mos  07/15/2013, 06/12/2014, 06/26/2015, 05/14/2016, 06/13/2017, 06/27/2018, 07/06/2019, 06/23/2021   Influenza-Unspecified 05/30/2012, 05/13/2017, 06/28/2022   Janssen (J&J) SARS-COV-2 Vaccination 02/05/2020   Pneumococcal Conjugate,unspecified 05/13/2017   Pneumococcal Conjugate-13 05/13/2017   Pneumococcal Polysaccharide-23 09/29/2017   Pneumococcal-Unspecified 05/13/2017   Tdap 08/20/2013   Zoster, Live 08/20/2013    TDAP status: Up to date  Flu Vaccine status: Up to date  Pneumococcal vaccine status: Up to date  Covid-19 vaccine status: Information provided on how to obtain vaccines.   Qualifies for Shingles Vaccine? Yes   Zostavax completed Yes   Shingrix Completed?: No.    Education has been provided regarding the importance of this vaccine. Patient has been advised to call insurance company to determine out of pocket expense if they have not yet received this vaccine. Advised may also receive vaccine at local pharmacy or Health Dept.  Verbalized acceptance and understanding.  Screening Tests Health Maintenance  Topic Date Due   Zoster Vaccines- Shingrix (1 of 2) Never done   COVID-19 Vaccine (2 - 2023-24 season) 05/13/2022   COLONOSCOPY (Pts 45-33yr Insurance coverage will need to be confirmed)  10/31/2022   PAP SMEAR-Modifier  07/31/2023   DTaP/Tdap/Td (6 - Td or Tdap) 08/21/2023   Medicare Annual Wellness (AWV)  11/10/2023   MAMMOGRAM  07/29/2024   INFLUENZA VACCINE  Completed   Hepatitis C Screening  Completed   HIV Screening  Completed   HPV VACCINES  Aged Out    Health Maintenance  Health Maintenance Due  Topic Date Due   Zoster Vaccines- Shingrix (1 of 2) Never done   COVID-19 Vaccine (2 - 2023-24 season) 05/13/2022   COLONOSCOPY (Pts 45-440yrInsurance coverage will need to be confirmed)  10/31/2022    Colorectal cancer screening: Type of screening: Colonoscopy. Completed 10/31/12. Repeat every 10 years  Declines further   Mammogram status: Completed  07/29/22. Repeat every year  Lung Cancer Screening: (Low Dose CT Chest recommended if Age 64-80ears, 30 pack-year currently smoking OR have quit w/in 15years.) does not qualify.   Lung Cancer Screening Referral: n/a  Additional Screening:  Hepatitis C Screening: does qualify; Completed 05/16/17  Vision Screening: Recommended annual ophthalmology exams for early detection of glaucoma and other disorders of the eye. Is the patient up to date with their annual eye exam?  Yes  Who is the provider or what is the name of the office in which the patient attends annual eye exams? Dr. JoDarcey NoraIf pt is not established with a provider, would they like to be referred to a provider to establish care? No .   Dental Screening: Recommended annual dental exams for proper oral hygiene  Community Resource Referral / Chronic Care Management: CRR required this visit?  No   CCM required this visit?  No      Plan:     I have personally reviewed and noted the following in the patient's chart:   Medical and social history Use of alcohol, tobacco or illicit drugs  Current medications and supplements including opioid prescriptions. Patient is not currently taking opioid prescriptions. Functional ability and status Nutritional status Physical activity Advanced directives List of other physicians Hospitalizations, surgeries, and ER visits in previous 12 months Vitals Screenings to include cognitive, depression, and falls Referrals and appointments  In addition, I have reviewed and discussed with patient certain preventive protocols, quality metrics, and best practice recommendations. A written personalized care plan for preventive services as well as general preventive health recommendations were provided to patient.     SlVanetta MuldersLPWy[place] [id number] Due to this being a virtual visit, the after visit summary with patients personalized plan was offered to patient via mail or  my-chart. Patient declined at this time./ Patient would like to access on my-chart/ per request, patient was mailed a copy of AVS./ Patient preferred to pick up at office at next visit   Nurse Notes: No concerns

## 2022-11-28 ENCOUNTER — Ambulatory Visit (INDEPENDENT_AMBULATORY_CARE_PROVIDER_SITE_OTHER): Payer: Medicare Other | Admitting: Family Medicine

## 2022-11-28 ENCOUNTER — Encounter: Payer: Self-pay | Admitting: Family Medicine

## 2022-11-28 VITALS — BP 120/72 | HR 73 | Temp 98.5°F | Ht 62.0 in | Wt 153.0 lb

## 2022-11-28 DIAGNOSIS — W540XXA Bitten by dog, initial encounter: Secondary | ICD-10-CM | POA: Diagnosis not present

## 2022-11-28 DIAGNOSIS — Z23 Encounter for immunization: Secondary | ICD-10-CM

## 2022-11-28 DIAGNOSIS — S61431A Puncture wound without foreign body of right hand, initial encounter: Secondary | ICD-10-CM

## 2022-11-28 MED ORDER — AMOXICILLIN-POT CLAVULANATE 875-125 MG PO TABS: 1.0000 | ORAL_TABLET | Freq: Two times a day (BID) | ORAL | 0 refills | Status: DC

## 2022-11-28 NOTE — Addendum Note (Signed)
Addended by: Randal Buba K on: 11/28/2022 12:11 PM   Modules accepted: Orders

## 2022-11-28 NOTE — Progress Notes (Signed)
Subjective:    Patient ID: [name], female    DOB: Feb 08, 1961, 62 y.o.   MRN: [id number] Saturday, the patient was playing with her dog.  The dog inadvertently bit her on the right hand.  This is not a dog we are concerned about rabies.  The patient's last tetanus shot was 10 years ago sure she is due for this.  However she did suffer a laceration.  She has a U-shaped skin tear on the dorsum of her right hand.  It is roughly 2.5 cm in width and 2 cm in length.  She also suffered a linear laceration adjacent to the first metacarpal.  Patient is missing her thumb due to a previous injury.  There is no erythema to suggest cellulitis.  There is no fluctuance.  There is no warmth.  She cleaned the wound out with soap and water.  She has been applying Neosporin to it since then. Past Medical History:  Diagnosis Date   Anxiety    Aortic atherosclerosis ([place])    Arthritis    Asthma    Bed bug bite    Blood transfusion without reported diagnosis    CA - skin cancer    Chronic pain due to injury RIGHT HAND/WRIST IN [id number]   Complication of anesthesia    unable to urinate after surgery   COPD (chronic obstructive pulmonary disease) ([place])    Stage 1   Crush injury of hand RIGHT IN 2007   X 6 SURG'S  LAST ONE BEING TOTAL WRIST FUSION W/ BONE GRAFT AND PLATE   Cubital tunnel syndrome    Left elbow   Depression    ETOH abuse    GERD (gastroesophageal reflux disease)    05/11/17- not recently   Hemorrhoids    History of kidney stones    passed   MRSA (methicillin resistant Staphylococcus aureus) carrier    Neuromuscular disorder ([place])    some fibromyalgia   Neuropathy    Osteopenia 07/2019   T score -1.9 FRAX 8.2% / 1.2%   PTSD (post-traumatic stress disorder) 2007 RIGHT HAND CRUSH INJURY W/ 2 FINGER AMPUTATION   Stroke ([place]) X2 CVA'S  IN 1990   LOSS OF VISION RIGHT Peripherial bilteral   Substance abuse ([place])    hx: narcotic use   UC (ulcerative colitis) ([facility])    Weakness of right  leg CAUSED BY RETRIEVAL THIGH MUSCLE FOR FLAP RIGHT HAND INJURY-- 2006   RIGHT LEG GIVES OUT OCCASIONALLY   Past Surgical History:  Procedure Laterality Date   AMPUTATION Left 05/13/2017   Procedure: Left small finger revision amputation ;  Surgeon: Roseanne Kaufman, MD;  Location: [place];  Service: Orthopedics;  Laterality: Left;  60 mins   CARPAL TUNNEL RELEASE Left 12/07/2020   Procedure: LEFT CARPAL TUNNEL RELEASE;  Surgeon: [name], MD;  Location: [place];  Service: Orthopedics;  Laterality: Left;   COLON SURGERY     15 years ago- fistula on colon removed   COSMETIC SURGERY     right leg and abdomen   DILATATION & CURETTAGE/HYSTEROSCOPY WITH MYOSURE N/A 06/14/2016   Procedure: DILATATION & CURETTAGE/HYSTEROSCOPY WITH MYOSURE;  Surgeon: Anastasio Auerbach, MD;  Location: [facility];  Service: Gynecology;  Laterality: N/A;   HAND CARPECTOMY  12/24/2005   RIGHT-- INCLUDING  REMOVAL  WRIST WIRES AND I &D (EXICIOSNAL)  FOR OSTEOMYELITIS   HYSTEROSCOPY WITH D & C  08/18/2011   Procedure: DILATATION AND CURETTAGE (D&C) /HYSTEROSCOPY;  Surgeon:  Anastasio Auerbach, MD;  Location: [facility];  Service: Gynecology;  Laterality: N/A;   INCISION / DRAINAGE HAND / FINGER  X3  (2006 & 2007)   RIGHT CRUSH WOUND   LESION REMOVAL  08/18/2011   Procedure: EXCISION VAGINAL LESION;  Surgeon: Anastasio Auerbach, MD;  Location: [place];  Service: Gynecology;  Laterality: N/A;   ORIF CARPAL BONE FRACTURE  08/31/2005   I & D OF RIGHT HAND CRUSH INJURY/ OPEN FX'S AND INDEX FINGER RAY RESECTION/ COMPLICATED CLOSURE   ORIF RADIAL FRACTURE  09/04/2005   DISTAL RADIAL PLATE AND PARTIAL THUMB AMPUTATION   RIGHT TOTAL WRIST FUSION W/ BONE GRAFT AND PLATE  075-GRM   AND FLAP USING RIGHT GOIN MUSCLE   skin cancer removal  2007   throat area   TUBAL LIGATION  15 YRS AGO   Current Outpatient Medications on File Prior to Visit  Medication Sig Dispense Refill    ADVAIR HFA 45-21 MCG/ACT inhaler INHALE 2 PUFFS INTO THE LUNGS TWICE A DAY 12 each 7   albuterol (VENTOLIN HFA) 108 (90 Base) MCG/ACT inhaler TAKE 2 PUFFS BY MOUTH EVERY 6 HOURS AS NEEDED FOR WHEEZE OR SHORTNESS OF BREATH 8.5 each 2   amoxicillin-clavulanate (AUGMENTIN) 875-125 MG tablet Take 1 tablet by mouth 2 (two) times daily. 20 tablet 0   EPINEPHrine 0.3 mg/0.3 mL IJ SOAJ injection Inject into the muscle.     estradiol (ESTRACE) 0.1 MG/GM vaginal cream Place vaginally.     linaclotide (LINZESS) 145 MCG CAPS capsule Take 1 capsule (145 mcg total) by mouth daily before breakfast. 30 capsule 11   pantoprazole (PROTONIX) 40 MG tablet TAKE 1 TABLET BY MOUTH EVERY DAY 90 tablet 1   Spacer/Aero-Holding Chambers (AEROCHAMBER MV) inhaler Use as instructed 1 each 0   valsartan (DIOVAN) 80 MG tablet Take 1 tablet (80 mg total) by mouth daily. 90 tablet 3   No current facility-administered medications on file prior to visit.     Allergies  Allergen Reactions   Aspirin Shortness Of Breath    ASTHMATIC ATTACK   Breztri Aerosphere [Budeson-Glycopyrrol-Formoterol] Other (See Comments)    Urinary retention   Codeine Other (See Comments)    ABNORMAL BEHAVIOR  Increased in bad moods  NO CODEINE BASED MEDICATIONS    Vicodin [Hydrocodone-Acetaminophen] Other (See Comments)    Extreme anxiety   Social History   Socioeconomic History   Marital status: Married    Spouse name: Not on file   Number of children: 6   Years of education: Not on file   Highest education level: Not on file  Occupational History   Occupation: Housekeeper   Tobacco Use   Smoking status: Former    Packs/day: 1.50    Years: 28.00    Additional pack years: 0.00    Total pack years: 42.00    Types: Cigarettes    Quit date: 11/29/2001    Years since quitting: 21.0   Smokeless tobacco: Never  Vaping Use   Vaping Use: Never used  Substance and Sexual Activity   Alcohol use: Yes    Comment: wine daily   Drug use:  Yes    Types: Marijuana    Comment: last smoked 12-08-20   Sexual activity: Yes    Partners: Male    Birth control/protection: Post-menopausal    Comment: 1st intercourse 62 yo-More than 5 partners  Other Topics Concern   Not on file  Social History Narrative   Lives with husband.  Social Determinants of Health   Financial Resource Strain: Low Risk  (11/10/2022)   Overall Financial Resource Strain (CARDIA)    Difficulty of Paying Living Expenses: Not very hard  Food Insecurity: No Food Insecurity (11/10/2022)   Hunger Vital Sign    Worried About Running Out of Food in the Last Year: Never true    Ran Out of Food in the Last Year: Never true  Transportation Needs: No Transportation Needs (11/10/2022)   PRAPARE - Hydrologist (Medical): No    Lack of Transportation (Non-Medical): No  Physical Activity: Insufficiently Active (11/10/2022)   Exercise Vital Sign    Days of Exercise per Week: 3 days    Minutes of Exercise per Session: 30 min  Stress: No Stress Concern Present (11/10/2022)   [place]    Feeling of Stress : Not at all  Social Connections: Moderately Isolated (11/10/2022)   Social Connection and Isolation Panel [NHANES]    Frequency of Communication with Friends and Family: More than three times a week    Frequency of Social Gatherings with Friends and Family: Once a week    Attends Religious Services: Never    Marine scientist or Organizations: No    Attends Archivist Meetings: Never    Marital Status: Married  Human resources officer Violence: Not At Risk (11/10/2022)   Humiliation, Afraid, Rape, and Kick questionnaire    Fear of Current or Ex-Partner: No    Emotionally Abused: No    Physically Abused: No    Sexually Abused: No     Review of Systems  Gastrointestinal:  Positive for abdominal pain.  All other systems reviewed and are negative.       Objective:   Physical Exam Vitals reviewed.  Constitutional:      General: She is not in acute distress.    Appearance: Normal appearance. She is normal weight. She is not ill-appearing, toxic-appearing or diaphoretic.  HENT:     Nose: Nose normal. No congestion or rhinorrhea.     Mouth/Throat:     Pharynx: No oropharyngeal exudate or posterior oropharyngeal erythema.  Cardiovascular:     Rate and Rhythm: Normal rate and regular rhythm.     Pulses: Normal pulses.     Heart sounds: Normal heart sounds. No murmur heard.    No friction rub. No gallop.  Pulmonary:     Effort: Pulmonary effort is normal. No respiratory distress.     Breath sounds: Normal breath sounds. No stridor or decreased air movement. No wheezing, rhonchi or rales.  Abdominal:     General: Bowel sounds are normal. There is no distension.     Palpations: Abdomen is soft. There is no mass.     Tenderness: There is no abdominal tenderness. There is no guarding or rebound.     Hernia: No hernia is present.  Musculoskeletal:     Left hand: Deformity, laceration and tenderness present. No swelling or bony tenderness. Normal range of motion. Normal strength. Normal sensation.       Hands:  Neurological:     Mental Status: She is alert.           Assessment & Plan:  Dog bite, initial encounter Given the location, I will start the patient on Augmentin 875 mg twice daily for 10 days to prevent infection.  Patient has already cleaned the wound thoroughly with soap and water 2 days ago.  I covered  the wound with Silvadene, nonadherent gauze, and wrapped with Coban.  I recommended perform these dressing changes daily and seek medical attention if she starts to develop cellulitis.  Patient was given a tetanus shot today

## 2022-12-30 ENCOUNTER — Other Ambulatory Visit: Payer: Self-pay | Admitting: Family Medicine

## 2023-02-09 ENCOUNTER — Encounter: Payer: Self-pay | Admitting: Family Medicine

## 2023-02-09 ENCOUNTER — Ambulatory Visit (INDEPENDENT_AMBULATORY_CARE_PROVIDER_SITE_OTHER): Payer: Medicare Other | Admitting: Family Medicine

## 2023-02-09 VITALS — BP 120/80 | HR 67 | Temp 97.8°F | Ht 62.0 in | Wt 153.0 lb

## 2023-02-09 DIAGNOSIS — Z13220 Encounter for screening for lipoid disorders: Secondary | ICD-10-CM

## 2023-02-09 DIAGNOSIS — K219 Gastro-esophageal reflux disease without esophagitis: Secondary | ICD-10-CM

## 2023-02-09 DIAGNOSIS — S161XXA Strain of muscle, fascia and tendon at neck level, initial encounter: Secondary | ICD-10-CM | POA: Diagnosis not present

## 2023-02-09 DIAGNOSIS — I10 Essential (primary) hypertension: Secondary | ICD-10-CM

## 2023-02-09 MED ORDER — FAMOTIDINE 20 MG PO TABS: 20.0000 mg | ORAL_TABLET | Freq: Every day | ORAL | 0 refills | Status: DC

## 2023-02-09 MED ORDER — METHOCARBAMOL 500 MG PO TABS: 500.0000 mg | ORAL_TABLET | Freq: Four times a day (QID) | ORAL | 0 refills | Status: DC

## 2023-02-09 NOTE — Progress Notes (Signed)
Acute Office Visit  Subjective:     Patient ID: [name], female    DOB: Feb 01, 1961, 62 y.o.   MRN: [id number]  Chief Complaint  Patient presents with   Depression    neck hurting/hard to move head     HPI Patient is in today for a few months of bilateral neck pain in her shoulders limiting her ROM. She denies injury or trauma. Has tried switching pillow on her bed and takes Ibuprofen or Tylenol. She denies pain in her arms. The pain is stable and not improving, described as an "irritation" and stiffness. She also complains of mid chest discomfort after she eats with nausea if she lies down soon after eating. It is similar to heartburn, associated with burping and nausea. She eats late some days and tries not to lie down immediately. Denies vomiting, left sided chest pain, shortness of breath. She has tried Protonix, tums, and baking soda.  Review of Systems  All other systems reviewed and are negative.   Past Medical History:  Diagnosis Date   Anxiety    Aortic atherosclerosis ([facility])    Arthritis    Asthma    Bed bug bite    Blood transfusion without reported diagnosis    CA - skin cancer    Chronic pain due to injury RIGHT HAND/WRIST IN 2007   Complication of anesthesia    unable to urinate after surgery   COPD (chronic obstructive pulmonary disease) ([facility])    Stage 1   Crush injury of hand RIGHT IN 2007   X 6 SURG'S  LAST ONE BEING TOTAL WRIST FUSION W/ BONE GRAFT AND PLATE   Cubital tunnel syndrome    Left elbow   Depression    ETOH abuse    GERD (gastroesophageal reflux disease)    05/11/17- not recently   Hemorrhoids    History of kidney stones    passed   MRSA (methicillin resistant Staphylococcus aureus) carrier    Neuromuscular disorder ([facility])    some fibromyalgia   Neuropathy    Osteopenia 07/2019   T score -1.9 FRAX 8.2% / 1.2%   PTSD (post-traumatic stress disorder) 2007 RIGHT HAND CRUSH INJURY W/ 2 FINGER AMPUTATION   Stroke ([facility]) X2 CVA'S  IN  1990   LOSS OF VISION RIGHT Peripherial bilteral   Substance abuse ([facility])    hx: narcotic use   UC (ulcerative colitis) ([facility])    Weakness of right leg CAUSED BY RETRIEVAL THIGH MUSCLE FOR FLAP RIGHT HAND INJURY-- 2006   RIGHT LEG GIVES OUT OCCASIONALLY   Past Surgical History:  Procedure Laterality Date   AMPUTATION Left 05/13/2017   Procedure: Left small finger revision amputation ;  Surgeon: Dominica Severin, MD;  Location: [facility];  Service: Orthopedics;  Laterality: Left;  60 mins   CARPAL TUNNEL RELEASE Left 12/07/2020   Procedure: LEFT CARPAL TUNNEL RELEASE;  Surgeon: Eldred Manges, MD;  Location: [facility];  Service: Orthopedics;  Laterality: Left;   COLON SURGERY     15 years ago- fistula on colon removed   COSMETIC SURGERY     right leg and abdomen   DILATATION & CURETTAGE/HYSTEROSCOPY WITH MYOSURE N/A 06/14/2016   Procedure: DILATATION & CURETTAGE/HYSTEROSCOPY WITH MYOSURE;  Surgeon: Dara Lords, MD;  Location: [facility];  Service: Gynecology;  Laterality: N/A;   HAND CARPECTOMY  12/24/2005   RIGHT-- INCLUDING  REMOVAL  WRIST WIRES AND I &D (EXICIOSNAL)  FOR OSTEOMYELITIS   HYSTEROSCOPY WITH D &  C  08/18/2011   Procedure: DILATATION AND CURETTAGE (D&C) /HYSTEROSCOPY;  Surgeon: Dara Lords, MD;  Location: [facility];  Service: Gynecology;  Laterality: N/A;   INCISION / DRAINAGE HAND / FINGER  X3  (2006 & 2007)   RIGHT CRUSH WOUND   LESION REMOVAL  08/18/2011   Procedure: EXCISION VAGINAL LESION;  Surgeon: Dara Lords, MD;  Location: [facility];  Service: Gynecology;  Laterality: N/A;   ORIF CARPAL BONE FRACTURE  08/31/2005   I & D OF RIGHT HAND CRUSH INJURY/ OPEN FX'S AND INDEX FINGER RAY RESECTION/ COMPLICATED CLOSURE   ORIF RADIAL FRACTURE  09/04/2005   DISTAL RADIAL PLATE AND PARTIAL THUMB AMPUTATION   RIGHT TOTAL WRIST FUSION W/ BONE GRAFT AND PLATE  16/06/9603   AND FLAP USING RIGHT GOIN MUSCLE   skin cancer  removal  2007   throat area   TUBAL LIGATION  15 YRS AGO   Current Outpatient Medications on File Prior to Visit  Medication Sig Dispense Refill   ADVAIR HFA 45-21 MCG/ACT inhaler INHALE 2 PUFFS INTO THE LUNGS TWICE A DAY 12 each 7   albuterol (VENTOLIN HFA) 108 (90 Base) MCG/ACT inhaler TAKE 2 PUFFS BY MOUTH EVERY 6 HOURS AS NEEDED FOR WHEEZE OR SHORTNESS OF BREATH 8.5 each 2   EPINEPHrine 0.3 mg/0.3 mL IJ SOAJ injection Inject into the muscle.     estradiol (ESTRACE) 0.1 MG/GM vaginal cream Place vaginally.     linaclotide (LINZESS) 145 MCG CAPS capsule Take 1 capsule (145 mcg total) by mouth daily before breakfast. 30 capsule 11   pantoprazole (PROTONIX) 40 MG tablet TAKE 1 TABLET BY MOUTH EVERY DAY 90 tablet 1   Spacer/Aero-Holding Chambers (AEROCHAMBER MV) inhaler Use as instructed 1 each 0   valsartan (DIOVAN) 80 MG tablet Take 1 tablet (80 mg total) by mouth daily. 90 tablet 3   amoxicillin-clavulanate (AUGMENTIN) 875-125 MG tablet Take 1 tablet by mouth 2 (two) times daily. (Patient not taking: Reported on 02/09/2023) 20 tablet 0   No current facility-administered medications on file prior to visit.   Allergies  Allergen Reactions   Aspirin Shortness Of Breath    ASTHMATIC ATTACK   Breztri Aerosphere [Budeson-Glycopyrrol-Formoterol] Other (See Comments)    Urinary retention   Codeine Other (See Comments)    ABNORMAL BEHAVIOR  Increased in bad moods  NO CODEINE BASED MEDICATIONS    Vicodin [Hydrocodone-Acetaminophen] Other (See Comments)    Extreme anxiety       Objective:    BP 120/80   Pulse 67   Temp 97.8 F (36.6 C) (Oral)   Ht 5\' 2"  (1.575 m)   Wt 153 lb (69.4 kg)   SpO2 98%   BMI 27.98 kg/m    Physical Exam Vitals and nursing note reviewed.  Constitutional:      Appearance: Normal appearance. She is normal weight.  HENT:     Head: Normocephalic and atraumatic.  Cardiovascular:     Rate and Rhythm: Normal rate and regular rhythm.     Pulses: Normal  pulses.     Heart sounds: Normal heart sounds.  Pulmonary:     Effort: Pulmonary effort is normal.     Breath sounds: Normal breath sounds.  Musculoskeletal:     Cervical back: Normal range of motion and neck supple. No rigidity or tenderness. Muscular tenderness present. No spinous process tenderness.  Skin:    General: Skin is warm and dry.  Neurological:     General: No  focal deficit present.     Mental Status: She is alert and oriented to person, place, and time. Mental status is at baseline.  Psychiatric:        Mood and Affect: Mood normal.        Behavior: Behavior normal.        Thought Content: Thought content normal.        Judgment: Judgment normal.     No results found for any visits on 02/09/23.      Assessment & Plan:   Problem List Items Addressed This Visit     Cervical strain - Primary    Symptoms consistent with cervical strain, no history of trauma/injury, no spinal tenderness, no radiculopathy. Unrelieved by NSAIDs, will try Robaxin 500mg  QID for 10 days and provided handout for cervical exercises.      Gastroesophageal reflux disease without esophagitis    Symptoms consistent with GERD. She takes Protonix currently, will try Famotidine 20mg  nighly for her PM symptoms. The pathophysiology of reflux is discussed.  Anti-reflux measures such as raising the head of the bed, avoiding tight clothing or belts, avoiding eating late at night and not lying down shortly after mealtime and achieving weight loss are discussed. Avoid ASA, NSAID's, caffeine, peppermints, alcohol and tobacco. She should alert me if there are persistent symptoms, dysphagia, weight loss or GI bleeding. Follow up if symptoms worse or do not improve in 1 week.       Relevant Medications   famotidine (PEPCID) 20 MG tablet   Other Visit Diagnoses     Benign essential HTN       Relevant Orders   CBC with Differential/Platelet   COMPLETE METABOLIC PANEL WITH GFR   Lipid panel   Screening  for lipid disorders       Relevant Orders   Lipid panel       Meds ordered this encounter  Medications   famotidine (PEPCID) 20 MG tablet    Sig: Take 1 tablet (20 mg total) by mouth at bedtime.    Dispense:  60 tablet    Refill:  0    Order Specific Question:   Supervising Provider    Answer:   Lynnea Ferrier T [[id number]]   methocarbamol (ROBAXIN) 500 MG tablet    Sig: Take 1 tablet (500 mg total) by mouth 4 (four) times daily.    Dispense:  40 tablet    Refill:  0    Order Specific Question:   Supervising Provider    Answer:   Lynnea Ferrier T [[id number]]    Return if symptoms worsen or fail to improve.  Park Meo, FNP

## 2023-02-09 NOTE — Assessment & Plan Note (Signed)
Symptoms consistent with GERD. She takes Protonix currently, will try Famotidine 20mg  nighly for her PM symptoms. The pathophysiology of reflux is discussed.  Anti-reflux measures such as raising the head of the bed, avoiding tight clothing or belts, avoiding eating late at night and not lying down shortly after mealtime and achieving weight loss are discussed. Avoid ASA, NSAID's, caffeine, peppermints, alcohol and tobacco. She should alert me if there are persistent symptoms, dysphagia, weight loss or GI bleeding. Follow up if symptoms worse or do not improve in 1 week.

## 2023-02-09 NOTE — Assessment & Plan Note (Signed)
Symptoms consistent with cervical strain, no history of trauma/injury, no spinal tenderness, no radiculopathy. Unrelieved by NSAIDs, will try Robaxin 500mg  QID for 10 days and provided handout for cervical exercises.

## 2023-02-10 LAB — CBC WITH DIFFERENTIAL/PLATELET
Absolute Monocytes: 812 cells/uL (ref 200–950)
Basophils Absolute: 69 cells/uL (ref 0–200)
Basophils Relative: 0.7 %
Eosinophils Absolute: 594 cells/uL — ABNORMAL HIGH (ref 15–500)
Eosinophils Relative: 6 %
HCT: 39.2 % (ref 35.0–45.0)
Hemoglobin: 12.7 g/dL (ref 11.7–15.5)
Lymphs Abs: 3277 cells/uL (ref 850–3900)
MCH: 30.5 pg (ref 27.0–33.0)
MCHC: 32.4 g/dL (ref 32.0–36.0)
MCV: 94 fL (ref 80.0–100.0)
MPV: 10.3 fL (ref 7.5–12.5)
Monocytes Relative: 8.2 %
Neutro Abs: 5148 cells/uL (ref 1500–7800)
Neutrophils Relative %: 52 %
Platelets: 368 10*3/uL (ref 140–400)
RBC: 4.17 10*6/uL (ref 3.80–5.10)
RDW: 12.3 % (ref 11.0–15.0)
Total Lymphocyte: 33.1 %
WBC: 9.9 10*3/uL (ref 3.8–10.8)

## 2023-02-10 LAB — LIPID PANEL
Cholesterol: 240 mg/dL — ABNORMAL HIGH (ref <200)
HDL: 55 mg/dL (ref >=50)
LDL Cholesterol (Calc): 149 mg/dL (calc) — ABNORMAL HIGH
Non-HDL Cholesterol (Calc): 185 mg/dL (calc) — ABNORMAL HIGH (ref <130)
Total CHOL/HDL Ratio: 4.4 (calc) (ref <5.0)
Triglycerides: 217 mg/dL — ABNORMAL HIGH (ref <150)

## 2023-02-10 LAB — COMPLETE METABOLIC PANEL WITH GFR
AG Ratio: 2.2 (calc) (ref 1.0–2.5)
ALT: 15 U/L (ref 6–29)
AST: 17 U/L (ref 10–35)
Albumin: 4.6 g/dL (ref 3.6–5.1)
Alkaline phosphatase (APISO): 64 U/L (ref 37–153)
BUN: 14 mg/dL (ref 7–25)
CO2: 29 mmol/L (ref 20–32)
Calcium: 9.7 mg/dL (ref 8.6–10.4)
Chloride: 104 mmol/L (ref 98–110)
Creat: 0.76 mg/dL (ref 0.50–1.05)
Globulin: 2.1 g/dL (calc) (ref 1.9–3.7)
Glucose, Bld: 85 mg/dL (ref 65–99)
Potassium: 4.3 mmol/L (ref 3.5–5.3)
Sodium: 140 mmol/L (ref 135–146)
Total Bilirubin: 0.4 mg/dL (ref 0.2–1.2)
Total Protein: 6.7 g/dL (ref 6.1–8.1)
eGFR: 88 mL/min/{1.73_m2} (ref >=60)

## 2023-02-13 ENCOUNTER — Other Ambulatory Visit: Payer: Self-pay | Admitting: Family Medicine

## 2023-02-13 MED ORDER — ROSUVASTATIN CALCIUM 20 MG PO TABS: 20.0000 mg | ORAL_TABLET | Freq: Every day | ORAL | 3 refills | Status: DC

## 2023-02-20 ENCOUNTER — Other Ambulatory Visit: Payer: Self-pay | Admitting: Family Medicine

## 2023-02-21 NOTE — Telephone Encounter (Signed)
Requested Prescriptions  Pending Prescriptions Disp Refills   LINZESS 145 MCG CAPS capsule [Pharmacy Med Name: LINZESS 145 MCG CAPSULE] 90 capsule 2    Sig: TAKE 1 CAPSULE BY MOUTH DAILY BEFORE BREAKFAST.     Gastroenterology: Irritable Bowel Syndrome Failed - 02/20/2023  8:32 AM      Failed - Valid encounter within last 12 months    Recent Outpatient Visits           1 year ago Colon cancer screening   [facility] Tanya Nones, Priscille Heidelberg, MD   1 year ago Diverticulitis   [facility] Tanya Nones Priscille Heidelberg, MD   1 year ago Dyspnea, unspecified type   [facility] Donita Brooks, MD   2 years ago Chest pain, unspecified type   [facility] Donita Brooks, MD   2 years ago Generalized abdominal pain   [facility] Pickard, Priscille Heidelberg, MD       Future Appointments             In 3 days Pickard, Priscille Heidelberg, MD [facility]

## 2023-02-22 ENCOUNTER — Other Ambulatory Visit: Payer: Self-pay | Admitting: Student

## 2023-02-24 ENCOUNTER — Ambulatory Visit (INDEPENDENT_AMBULATORY_CARE_PROVIDER_SITE_OTHER): Payer: Medicare Other | Admitting: Family Medicine

## 2023-02-24 ENCOUNTER — Encounter: Payer: Self-pay | Admitting: Family Medicine

## 2023-02-24 VITALS — BP 136/72 | HR 71 | Temp 98.4°F | Ht 62.0 in | Wt 152.2 lb

## 2023-02-24 DIAGNOSIS — M542 Cervicalgia: Secondary | ICD-10-CM

## 2023-02-24 DIAGNOSIS — H539 Unspecified visual disturbance: Secondary | ICD-10-CM

## 2023-02-24 NOTE — Progress Notes (Signed)
Subjective:    Patient ID: [name], female    DOB: 1961-03-01, 62 y.o.   MRN: [id number]  Patient presents today with floaters in her left eye.  Patient has a history of damage in her right eye and is blind in her right eye.  Therefore this is very concerning to her.  She states that she has noticed "a spiderweb over her vision.  She also notices black spots that float in the vision.  These move and are not isolated to one particular visual field.  They move every time she moves her eye.  However she has noticed a black spot that is not going away.  She describes it as a black or green in her field of vision similar to a black rubber band  Second concern is pain on both sides of her neck that radiates up into her occiput.  She describes it as shooting pains and constant muscle pain in her neck.  The muscle relaxers that were given to her by another physician have not helped Past Medical History:  Diagnosis Date   Anxiety    Aortic atherosclerosis (HCC)    Arthritis    Asthma    Bed bug bite    Blood transfusion without reported diagnosis    CA - skin cancer    Chronic pain due to injury RIGHT HAND/WRIST IN 2007   Complication of anesthesia    unable to urinate after surgery   COPD (chronic obstructive pulmonary disease) (HCC)    Stage 1   Crush injury of hand RIGHT IN 2007   X 6 SURG'S  LAST ONE BEING TOTAL WRIST FUSION W/ BONE GRAFT AND PLATE   Cubital tunnel syndrome    Left elbow   Depression    ETOH abuse    GERD (gastroesophageal reflux disease)    05/11/17- not recently   Hemorrhoids    History of kidney stones    passed   MRSA (methicillin resistant Staphylococcus aureus) carrier    Neuromuscular disorder (HCC)    some fibromyalgia   Neuropathy    Osteopenia 07/2019   T score -1.9 FRAX 8.2% / 1.2%   PTSD (post-traumatic stress disorder) 2007 RIGHT HAND CRUSH INJURY W/ 2 FINGER AMPUTATION   Stroke (HCC) X2 CVA'S  IN 1990   LOSS OF VISION RIGHT Peripherial  bilteral   Substance abuse (HCC)    hx: narcotic use   UC (ulcerative colitis) (HCC)    Weakness of right leg CAUSED BY RETRIEVAL THIGH MUSCLE FOR FLAP RIGHT HAND INJURY-- 2006   RIGHT LEG GIVES OUT OCCASIONALLY   Past Surgical History:  Procedure Laterality Date   AMPUTATION Left 05/13/2017   Procedure: Left small finger revision amputation ;  Surgeon: Dominica Severin, MD;  Location: [facility];  Service: Orthopedics;  Laterality: Left;  60 mins   CARPAL TUNNEL RELEASE Left 12/07/2020   Procedure: LEFT CARPAL TUNNEL RELEASE;  Surgeon: Eldred Manges, MD;  Location: [facility];  Service: Orthopedics;  Laterality: Left;   COLON SURGERY     15 years ago- fistula on colon removed   COSMETIC SURGERY     right leg and abdomen   DILATATION & CURETTAGE/HYSTEROSCOPY WITH MYOSURE N/A 06/14/2016   Procedure: DILATATION & CURETTAGE/HYSTEROSCOPY WITH MYOSURE;  Surgeon: Dara Lords, MD;  Location: [facility];  Service: Gynecology;  Laterality: N/A;   HAND CARPECTOMY  12/24/2005   RIGHT-- INCLUDING  REMOVAL  WRIST WIRES AND I &D (EXICIOSNAL)  FOR OSTEOMYELITIS  HYSTEROSCOPY WITH D & C  08/18/2011   Procedure: DILATATION AND CURETTAGE (D&C) /HYSTEROSCOPY;  Surgeon: Dara Lords, MD;  Location: [facility];  Service: Gynecology;  Laterality: N/A;   INCISION / DRAINAGE HAND / FINGER  X3  (2006 & 2007)   RIGHT CRUSH WOUND   LESION REMOVAL  08/18/2011   Procedure: EXCISION VAGINAL LESION;  Surgeon: Dara Lords, MD;  Location: [facility];  Service: Gynecology;  Laterality: N/A;   ORIF CARPAL BONE FRACTURE  08/31/2005   I & D OF RIGHT HAND CRUSH INJURY/ OPEN FX'S AND INDEX FINGER RAY RESECTION/ COMPLICATED CLOSURE   ORIF RADIAL FRACTURE  09/04/2005   DISTAL RADIAL PLATE AND PARTIAL THUMB AMPUTATION   RIGHT TOTAL WRIST FUSION W/ BONE GRAFT AND PLATE  29/51/8841   AND FLAP USING RIGHT GOIN MUSCLE   skin cancer removal  2007   throat area   TUBAL  LIGATION  15 YRS AGO   Current Outpatient Medications on File Prior to Visit  Medication Sig Dispense Refill   albuterol (VENTOLIN HFA) 108 (90 Base) MCG/ACT inhaler TAKE 2 PUFFS BY MOUTH EVERY 6 HOURS AS NEEDED FOR WHEEZE OR SHORTNESS OF BREATH 8.5 each 2   EPINEPHrine 0.3 mg/0.3 mL IJ SOAJ injection Inject into the muscle.     estradiol (ESTRACE) 0.1 MG/GM vaginal cream Place vaginally.     famotidine (PEPCID) 20 MG tablet Take 1 tablet (20 mg total) by mouth at bedtime. 60 tablet 0   fluticasone-salmeterol (ADVAIR HFA) 45-21 MCG/ACT inhaler INHALE 2 PUFFS INTO THE LUNGS TWICE A DAY 12 each 1   linaclotide (LINZESS) 145 MCG CAPS capsule TAKE 1 CAPSULE BY MOUTH DAILY BEFORE BREAKFAST. 90 capsule 2   pantoprazole (PROTONIX) 40 MG tablet TAKE 1 TABLET BY MOUTH EVERY DAY 90 tablet 1   rosuvastatin (CRESTOR) 20 MG tablet Take 1 tablet (20 mg total) by mouth daily. 90 tablet 3   Spacer/Aero-Holding Chambers (AEROCHAMBER MV) inhaler Use as instructed 1 each 0   valsartan (DIOVAN) 80 MG tablet Take 1 tablet (80 mg total) by mouth daily. 90 tablet 3   methocarbamol (ROBAXIN) 500 MG tablet Take 1 tablet (500 mg total) by mouth 4 (four) times daily. (Patient not taking: Reported on 02/24/2023) 40 tablet 0   No current facility-administered medications on file prior to visit.     Allergies  Allergen Reactions   Aspirin Shortness Of Breath    ASTHMATIC ATTACK   Breztri Aerosphere [Budeson-Glycopyrrol-Formoterol] Other (See Comments)    Urinary retention   Codeine Other (See Comments)    ABNORMAL BEHAVIOR  Increased in bad moods  NO CODEINE BASED MEDICATIONS    Vicodin [Hydrocodone-Acetaminophen] Other (See Comments)    Extreme anxiety   Social History   Socioeconomic History   Marital status: Married    Spouse name: Not on file   Number of children: 6   Years of education: Not on file   Highest education level: Not on file  Occupational History   Occupation: Housekeeper   Tobacco Use    Smoking status: Former    Packs/day: 1.50    Years: 28.00    Additional pack years: 0.00    Total pack years: 42.00    Types: Cigarettes    Quit date: 11/29/2001    Years since quitting: 21.2   Smokeless tobacco: Never  Vaping Use   Vaping Use: Never used  Substance and Sexual Activity   Alcohol use: Yes    Comment: wine  daily   Drug use: Yes    Types: Marijuana    Comment: last smoked 12-08-20   Sexual activity: Yes    Partners: Male    Birth control/protection: Post-menopausal    Comment: 1st intercourse 62 yo-More than 5 partners  Other Topics Concern   Not on file  Social History Narrative   Lives with husband.     Social Determinants of Health   Financial Resource Strain: Low Risk  (11/10/2022)   Overall Financial Resource Strain (CARDIA)    Difficulty of Paying Living Expenses: Not very hard  Food Insecurity: No Food Insecurity (11/10/2022)   Hunger Vital Sign    Worried About Running Out of Food in the Last Year: Never true    Ran Out of Food in the Last Year: Never true  Transportation Needs: No Transportation Needs (11/10/2022)   PRAPARE - Administrator, Civil Service (Medical): No    Lack of Transportation (Non-Medical): No  Physical Activity: Insufficiently Active (11/10/2022)   Exercise Vital Sign    Days of Exercise per Week: 3 days    Minutes of Exercise per Session: 30 min  Stress: No Stress Concern Present (11/10/2022)   [employer] of Occupational Health - Occupational Stress Questionnaire    Feeling of Stress : Not at all  Social Connections: Moderately Isolated (11/10/2022)   Social Connection and Isolation Panel [NHANES]    Frequency of Communication with Friends and Family: More than three times a week    Frequency of Social Gatherings with Friends and Family: Once a week    Attends Religious Services: Never    Database administrator or Organizations: No    Attends Banker Meetings: Never    Marital Status: Married   Catering manager Violence: Not At Risk (11/10/2022)   Humiliation, Afraid, Rape, and Kick questionnaire    Fear of Current or Ex-Partner: No    Emotionally Abused: No    Physically Abused: No    Sexually Abused: No     Review of Systems  Gastrointestinal:  Positive for abdominal pain.  All other systems reviewed and are negative.      Objective:   Physical Exam Vitals reviewed.  Constitutional:      General: She is not in acute distress.    Appearance: Normal appearance. She is normal weight. She is not ill-appearing, toxic-appearing or diaphoretic.  Eyes:     General: Lids are everted, no foreign bodies appreciated. Vision grossly intact.        Right eye: No discharge.        Left eye: No discharge.     Extraocular Movements: Extraocular movements intact.     Conjunctiva/sclera: Conjunctivae normal.     Right eye: Right conjunctiva is not injected. No chemosis, exudate or hemorrhage.    Left eye: Left conjunctiva is not injected. No chemosis, exudate or hemorrhage.    Pupils: Pupils are equal, round, and reactive to light.  Cardiovascular:     Rate and Rhythm: Normal rate and regular rhythm.     Pulses: Normal pulses.     Heart sounds: Normal heart sounds. No murmur heard.    No friction rub. No gallop.  Pulmonary:     Effort: Pulmonary effort is normal. No respiratory distress.     Breath sounds: Normal breath sounds. No stridor or decreased air movement. No wheezing, rhonchi or rales.  Abdominal:     General: Bowel sounds are normal. There is no distension.  Palpations: Abdomen is soft. There is no mass.     Tenderness: There is no abdominal tenderness. There is no guarding or rebound.     Hernia: No hernia is present.  Musculoskeletal:     Cervical back: Crepitus present. No rigidity. Pain with movement present. No spinous process tenderness or muscular tenderness. Decreased range of motion.  Neurological:     Mental Status: She is alert.            Assessment & Plan:  Vision disturbance - Plan: Ambulatory referral to Ophthalmology  Neck pain - Plan: DG Cervical Spine Complete I am very concerned about the visual disturbance.  Differential diagnosis includes posterior vitreous detachment with retinal detachment/retinal tear versus ocular migraines versus benign posterior vitreous detachment.  Patient needs to see ophthalmology as soon as possible to rule out a retinal tear/retinal detachment.  I do not have the equipment today for a proper funduscopic examination.  I am unable to see any retinal tear.  Her vision is grossly intact.  Therefore we will try to expedite an ophthalmology referral.  Patient was concerned that the neck pain may signify a pending stroke.  I do not feel that the neck pain is related to a stroke.  I believe that she likely has degenerative disc disease and could be having muscular and neuropathic pain related to this.  I do not believe that the eyesight and the neck pain are related.  Start by obtaining an x-ray of the neck.

## 2023-03-01 ENCOUNTER — Ambulatory Visit
Admission: RE | Admit: 2023-03-01 | Discharge: 2023-03-01 | Disposition: A | Discharge status: Home / Self Care | Payer: Medicare Other | Source: Ambulatory Visit | Attending: Family Medicine | Admitting: Family Medicine

## 2023-03-01 DIAGNOSIS — H25813 Combined forms of age-related cataract, bilateral: Secondary | ICD-10-CM | POA: Diagnosis not present

## 2023-03-01 DIAGNOSIS — M542 Cervicalgia: Secondary | ICD-10-CM | POA: Diagnosis not present

## 2023-03-01 DIAGNOSIS — H35033 Hypertensive retinopathy, bilateral: Secondary | ICD-10-CM | POA: Diagnosis not present

## 2023-03-01 DIAGNOSIS — H40013 Open angle with borderline findings, low risk, bilateral: Secondary | ICD-10-CM | POA: Diagnosis not present

## 2023-03-01 DIAGNOSIS — H43812 Vitreous degeneration, left eye: Secondary | ICD-10-CM | POA: Diagnosis not present

## 2023-03-23 DIAGNOSIS — H25813 Combined forms of age-related cataract, bilateral: Secondary | ICD-10-CM | POA: Diagnosis not present

## 2023-03-23 DIAGNOSIS — H40013 Open angle with borderline findings, low risk, bilateral: Secondary | ICD-10-CM | POA: Diagnosis not present

## 2023-03-23 DIAGNOSIS — H43812 Vitreous degeneration, left eye: Secondary | ICD-10-CM | POA: Diagnosis not present

## 2023-04-02 ENCOUNTER — Other Ambulatory Visit: Payer: Self-pay | Admitting: Family Medicine

## 2023-04-20 ENCOUNTER — Other Ambulatory Visit: Payer: Self-pay | Admitting: Family Medicine

## 2023-04-20 ENCOUNTER — Telehealth: Payer: Self-pay | Admitting: Family Medicine

## 2023-04-20 ENCOUNTER — Other Ambulatory Visit: Payer: Self-pay

## 2023-04-20 MED ORDER — FLUTICASONE-SALMETEROL 230-21 MCG/ACT IN AERO: 2.0000 | INHALATION_SPRAY | Freq: Two times a day (BID) | RESPIRATORY_TRACT | 12 refills | Status: DC

## 2023-04-20 NOTE — Telephone Encounter (Signed)
Prescription Request  04/20/2023  LOV: 02/24/2023  What is the name of the medication or equipment?   fluticasone-salmeterol (ADVAIR HFA) 45-21 MCG/ACT inhaler  **Pharmacy hasn't been able to contact this office; instructed patient to call**  Have you contacted your pharmacy to request a refill? Yes   Which pharmacy would you like this sent to?  [id number] - [place], [place] - [address] [place] [address] Phone: [phone] Fax: [phone]    Patient notified that their request is being sent to the clinical staff for review and that they should receive a response within 2 business days.   Please advise patient.

## 2023-04-21 ENCOUNTER — Other Ambulatory Visit: Payer: Self-pay | Admitting: Family Medicine

## 2023-04-24 ENCOUNTER — Telehealth: Payer: Self-pay

## 2023-04-24 ENCOUNTER — Other Ambulatory Visit: Payer: Self-pay

## 2023-04-24 NOTE — Telephone Encounter (Signed)
Pt called and states her insurance will not cover the Advair 200 any longer but will cover the strength above it? Pt asks is the other dose of Advair can be sent in. Thank you.

## 2023-04-24 NOTE — Telephone Encounter (Signed)
Requested medication (s) are due for refill today: pharmacy needs clarification  Requested medication (s) are on the active medication list: yes  Last refill:  04/20/23  Future visit scheduled: no  Notes to clinic:  Pharmacy comment: Script Clarification:PATIENT WAS ON 45-21 DOSE NOT AWARE DR WAS CHANGING DOSE PLEASE CONFIRM.      Requested Prescriptions  Pending Prescriptions Disp Refills   ADVAIR HFA 230-21 MCG/ACT inhaler [Pharmacy Med Name: ADVAIR HFA 230-21 MCG INHALER] 12 each 12    Sig: INHALE 2 PUFFS INTO THE LUNGS TWICE A DAY     Pulmonology:  Combination Products Failed - 04/21/2023 10:27 AM      Failed - Valid encounter within last 12 months    Recent Outpatient Visits           1 year ago Colon cancer screening   [facility] Donita Brooks, MD   1 year ago Diverticulitis   [facility] Donita Brooks, MD   1 year ago Dyspnea, unspecified type   [facility] Donita Brooks, MD   2 years ago Chest pain, unspecified type   [facility] Donita Brooks, MD   2 years ago Generalized abdominal pain   [facility] Pickard, Priscille Heidelberg, MD

## 2023-05-02 ENCOUNTER — Telehealth: Payer: Self-pay | Admitting: Student

## 2023-05-02 NOTE — Telephone Encounter (Signed)
Former patient of Dr.Meier. Needs a refill advair hsa . Her insurance doesn't cover it though. Please call to approve of the medication if possible.   Pharmacy [facility]

## 2023-05-03 ENCOUNTER — Other Ambulatory Visit (HOSPITAL_COMMUNITY): Payer: Self-pay

## 2023-05-03 NOTE — Telephone Encounter (Signed)
Test claim shows a successful claim with a co-pay of $11.20. Pharmacy should have no problems filling this.

## 2023-05-06 ENCOUNTER — Other Ambulatory Visit: Payer: Self-pay | Admitting: Family Medicine

## 2023-05-09 NOTE — Telephone Encounter (Signed)
Requested Prescriptions  Refused Prescriptions Disp Refills   valsartan (DIOVAN) 80 MG tablet [Pharmacy Med Name: VALSARTAN 80 MG TABLET] 90 tablet 3    Sig: TAKE 1 TABLET BY MOUTH EVERY DAY     Cardiovascular:  Angiotensin Receptor Blockers Failed - 05/06/2023  7:00 PM      Failed - Valid encounter within last 6 months    Recent Outpatient Visits           1 year ago Colon cancer screening   [facility] Pickard, Priscille Heidelberg, MD   1 year ago Diverticulitis   [facility] Donita Brooks, MD   1 year ago Dyspnea, unspecified type   [facility] Donita Brooks, MD   2 years ago Chest pain, unspecified type   [facility] Donita Brooks, MD   2 years ago Generalized abdominal pain   [facility] Tanya Nones, Priscille Heidelberg, MD              Passed - Cr in normal range and within 180 days    Creat  Date Value Ref Range Status  02/09/2023 0.76 0.50 - 1.05 mg/dL Final         Passed - K in normal range and within 180 days    Potassium  Date Value Ref Range Status  02/09/2023 4.3 3.5 - 5.3 mmol/L Final         Passed - Patient is not pregnant      Passed - Last BP in normal range    BP Readings from Last 1 Encounters:  02/24/23 136/72          pantoprazole (PROTONIX) 40 MG tablet [Pharmacy Med Name: PANTOPRAZOLE SOD DR 40 MG TAB] 90 tablet 1    Sig: TAKE 1 TABLET BY MOUTH EVERY DAY     Gastroenterology: Proton Pump Inhibitors Failed - 05/06/2023  7:00 PM      Failed - Valid encounter within last 12 months    Recent Outpatient Visits           1 year ago Colon cancer screening   [facility] Donita Brooks, MD   1 year ago Diverticulitis   [facility] Donita Brooks, MD   1 year ago Dyspnea, unspecified type   [facility] Donita Brooks, MD   2 years ago Chest pain, unspecified type   [facility] Donita Brooks,  MD   2 years ago Generalized abdominal pain   [facility] Pickard, Priscille Heidelberg, MD

## 2023-05-11 ENCOUNTER — Encounter: Payer: Self-pay | Admitting: Family Medicine

## 2023-05-11 ENCOUNTER — Ambulatory Visit (INDEPENDENT_AMBULATORY_CARE_PROVIDER_SITE_OTHER): Payer: Medicare Other | Admitting: Family Medicine

## 2023-05-11 VITALS — BP 128/72 | HR 61 | Temp 97.9°F | Ht 62.0 in | Wt 153.2 lb

## 2023-05-11 DIAGNOSIS — D171 Benign lipomatous neoplasm of skin and subcutaneous tissue of trunk: Secondary | ICD-10-CM

## 2023-05-11 DIAGNOSIS — L723 Sebaceous cyst: Secondary | ICD-10-CM

## 2023-05-11 MED ORDER — FLUTICASONE-SALMETEROL 230-21 MCG/ACT IN AERO: 2.0000 | INHALATION_SPRAY | Freq: Two times a day (BID) | RESPIRATORY_TRACT | 0 refills | Status: DC

## 2023-05-11 NOTE — Telephone Encounter (Signed)
Refill sent.  Will need appointment for further refills or can reach out to PCP.

## 2023-05-11 NOTE — Progress Notes (Signed)
Subjective:    Patient ID: [name], female    DOB: 1961-05-10, 62 y.o.   MRN: [id number]  Patient is concerned about 2 lesions.  First on her left tricep just below the deltoid, there is an erythematous subcutaneous tender lump this approximately 5 mm in diameter.  I believe it is a small inflamed sebaceous cyst.  It has been there for quite some time but it recently became red and tender.  On her back roughly at the level of T10 to the right of the midline is a large lipoma.  Is about 5 cm in diameter. Past Medical History:  Diagnosis Date   Anxiety    Aortic atherosclerosis (HCC)    Arthritis    Asthma    Bed bug bite    Blood transfusion without reported diagnosis    CA - skin cancer    Chronic pain due to injury RIGHT HAND/WRIST IN 2007   Complication of anesthesia    unable to urinate after surgery   COPD (chronic obstructive pulmonary disease) (HCC)    Stage 1   Crush injury of hand RIGHT IN 2007   X 6 SURG'S  LAST ONE BEING TOTAL WRIST FUSION W/ BONE GRAFT AND PLATE   Cubital tunnel syndrome    Left elbow   Depression    ETOH abuse    GERD (gastroesophageal reflux disease)    05/11/17- not recently   Hemorrhoids    History of kidney stones    passed   MRSA (methicillin resistant Staphylococcus aureus) carrier    Neuromuscular disorder (HCC)    some fibromyalgia   Neuropathy    Osteopenia 07/2019   T score -1.9 FRAX 8.2% / 1.2%   PTSD (post-traumatic stress disorder) 2007 RIGHT HAND CRUSH INJURY W/ 2 FINGER AMPUTATION   Stroke (HCC) X2 CVA'S  IN 1990   LOSS OF VISION RIGHT Peripherial bilteral   Substance abuse (HCC)    hx: narcotic use   UC (ulcerative colitis) (HCC)    Weakness of right leg CAUSED BY RETRIEVAL THIGH MUSCLE FOR FLAP RIGHT HAND INJURY-- 2006   RIGHT LEG GIVES OUT OCCASIONALLY   Past Surgical History:  Procedure Laterality Date   AMPUTATION Left 05/13/2017   Procedure: Left small finger revision amputation ;  Surgeon: Dominica Severin,  MD;  Location: [facility];  Service: Orthopedics;  Laterality: Left;  60 mins   CARPAL TUNNEL RELEASE Left 12/07/2020   Procedure: LEFT CARPAL TUNNEL RELEASE;  Surgeon: Eldred Manges, MD;  Location: [facility];  Service: Orthopedics;  Laterality: Left;   COLON SURGERY     15 years ago- fistula on colon removed   COSMETIC SURGERY     right leg and abdomen   DILATATION & CURETTAGE/HYSTEROSCOPY WITH MYOSURE N/A 06/14/2016   Procedure: DILATATION & CURETTAGE/HYSTEROSCOPY WITH MYOSURE;  Surgeon: Dara Lords, MD;  Location: [facility];  Service: Gynecology;  Laterality: N/A;   HAND CARPECTOMY  12/24/2005   RIGHT-- INCLUDING  REMOVAL  WRIST WIRES AND I &D (EXICIOSNAL)  FOR OSTEOMYELITIS   HYSTEROSCOPY WITH D & C  08/18/2011   Procedure: DILATATION AND CURETTAGE (D&C) /HYSTEROSCOPY;  Surgeon: Dara Lords, MD;  Location: [facility];  Service: Gynecology;  Laterality: N/A;   INCISION / DRAINAGE HAND / FINGER  X3  (2006 & 2007)   RIGHT CRUSH WOUND   LESION REMOVAL  08/18/2011   Procedure: EXCISION VAGINAL LESION;  Surgeon: Dara Lords, MD;  Location: [facility];  Service: Gynecology;  Laterality: N/A;   ORIF CARPAL BONE FRACTURE  08/31/2005   I & D OF RIGHT HAND CRUSH INJURY/ OPEN FX'S AND INDEX FINGER RAY RESECTION/ COMPLICATED CLOSURE   ORIF RADIAL FRACTURE  09/04/2005   DISTAL RADIAL PLATE AND PARTIAL THUMB AMPUTATION   RIGHT TOTAL WRIST FUSION W/ BONE GRAFT AND PLATE  16/06/9603   AND FLAP USING RIGHT GOIN MUSCLE   skin cancer removal  2007   throat area   TUBAL LIGATION  15 YRS AGO   Current Outpatient Medications on File Prior to Visit  Medication Sig Dispense Refill   albuterol (VENTOLIN HFA) 108 (90 Base) MCG/ACT inhaler TAKE 2 PUFFS BY MOUTH EVERY 6 HOURS AS NEEDED FOR WHEEZE OR SHORTNESS OF BREATH 8.5 each 2   EPINEPHrine 0.3 mg/0.3 mL IJ SOAJ injection Inject into the muscle.     estradiol (ESTRACE) 0.1 MG/GM vaginal cream  Place vaginally.     famotidine (PEPCID) 20 MG tablet TAKE 1 TABLET BY MOUTH EVERYDAY AT BEDTIME 90 tablet 1   fluticasone-salmeterol (ADVAIR HFA) 230-21 MCG/ACT inhaler Inhale 2 puffs into the lungs 2 (two) times daily. INHALE 2 PUFFS INTO THE LUNGS TWICE A DAY 12 each 0   linaclotide (LINZESS) 145 MCG CAPS capsule TAKE 1 CAPSULE BY MOUTH DAILY BEFORE BREAKFAST. 90 capsule 2   methocarbamol (ROBAXIN) 500 MG tablet Take 1 tablet (500 mg total) by mouth 4 (four) times daily. 40 tablet 0   pantoprazole (PROTONIX) 40 MG tablet TAKE 1 TABLET BY MOUTH EVERY DAY 90 tablet 1   rosuvastatin (CRESTOR) 20 MG tablet Take 1 tablet (20 mg total) by mouth daily. 90 tablet 3   Spacer/Aero-Holding Chambers (AEROCHAMBER MV) inhaler Use as instructed 1 each 0   valsartan (DIOVAN) 80 MG tablet Take 1 tablet (80 mg total) by mouth daily. 90 tablet 3   No current facility-administered medications on file prior to visit.     Allergies  Allergen Reactions   Aspirin Shortness Of Breath    ASTHMATIC ATTACK   Breztri Aerosphere [Budeson-Glycopyrrol-Formoterol] Other (See Comments)    Urinary retention   Codeine Other (See Comments)    ABNORMAL BEHAVIOR  Increased in bad moods  NO CODEINE BASED MEDICATIONS    Vicodin [Hydrocodone-Acetaminophen] Other (See Comments)    Extreme anxiety   Social History   Socioeconomic History   Marital status: Married    Spouse name: Not on file   Number of children: 6   Years of education: Not on file   Highest education level: Not on file  Occupational History   Occupation: Housekeeper   Tobacco Use   Smoking status: Former    Current packs/day: 0.00    Average packs/day: 1.5 packs/day for 28.0 years (42.0 ttl pk-yrs)    Types: Cigarettes    Start date: 11/29/1973    Quit date: 11/29/2001    Years since quitting: 21.4   Smokeless tobacco: Never  Vaping Use   Vaping status: Never Used  Substance and Sexual Activity   Alcohol use: Yes    Comment: wine daily    Drug use: Yes    Types: Marijuana    Comment: last smoked 12-08-20   Sexual activity: Yes    Partners: Male    Birth control/protection: Post-menopausal    Comment: 1st intercourse 62 yo-More than 5 partners  Other Topics Concern   Not on file  Social History Narrative   Lives with husband.     Social Determinants of Health  Financial Resource Strain: Low Risk  (11/10/2022)   Overall Financial Resource Strain (CARDIA)    Difficulty of Paying Living Expenses: Not very hard  Food Insecurity: No Food Insecurity (11/10/2022)   Hunger Vital Sign    Worried About Running Out of Food in the Last Year: Never true    Ran Out of Food in the Last Year: Never true  Transportation Needs: No Transportation Needs (11/10/2022)   PRAPARE - Administrator, Civil Service (Medical): No    Lack of Transportation (Non-Medical): No  Physical Activity: Insufficiently Active (11/10/2022)   Exercise Vital Sign    Days of Exercise per Week: 3 days    Minutes of Exercise per Session: 30 min  Stress: No Stress Concern Present (11/10/2022)   [employer] of Occupational Health - Occupational Stress Questionnaire    Feeling of Stress : Not at all  Social Connections: Moderately Isolated (11/10/2022)   Social Connection and Isolation Panel [NHANES]    Frequency of Communication with Friends and Family: More than three times a week    Frequency of Social Gatherings with Friends and Family: Once a week    Attends Religious Services: Never    Database administrator or Organizations: No    Attends Banker Meetings: Never    Marital Status: Married  Catering manager Violence: Not At Risk (11/10/2022)   Humiliation, Afraid, Rape, and Kick questionnaire    Fear of Current or Ex-Partner: No    Emotionally Abused: No    Physically Abused: No    Sexually Abused: No     Review of Systems  Gastrointestinal:  Positive for abdominal pain.  All other systems reviewed and are negative.       Objective:   Physical Exam Vitals reviewed.  Constitutional:      General: She is not in acute distress.    Appearance: Normal appearance. She is normal weight. She is not ill-appearing, toxic-appearing or diaphoretic.  Eyes:     General: Lids are everted, no foreign bodies appreciated. Vision grossly intact.     Conjunctiva/sclera:     Right eye: Right conjunctiva is not injected. No chemosis, exudate or hemorrhage.    Left eye: Left conjunctiva is not injected. No chemosis, exudate or hemorrhage. Cardiovascular:     Rate and Rhythm: Normal rate and regular rhythm.     Pulses: Normal pulses.     Heart sounds: Normal heart sounds. No murmur heard.    No friction rub. No gallop.  Pulmonary:     Effort: Pulmonary effort is normal. No respiratory distress.     Breath sounds: Normal breath sounds. No stridor or decreased air movement. No wheezing, rhonchi or rales.  Abdominal:     General: Bowel sounds are normal. There is no distension.     Palpations: Abdomen is soft. There is no mass.     Tenderness: There is no abdominal tenderness. There is no guarding or rebound.     Hernia: No hernia is present.  Musculoskeletal:     Cervical back: No spinous process tenderness or muscular tenderness.       Back:  Neurological:     Mental Status: She is alert.           Assessment & Plan:  Sebaceous cyst  Lipoma of torso - Plan: Ambulatory referral to General Surgery Patient request excision of the small sebaceous cyst on her left posterior tricep.  I anesthetized the lesion with 0.1% lidocaine with epinephrine.  I cleaned the lesion with Betadine.  Then using a shave biopsy/razor blade to scoop out the lesion down to the underlying dermis.  This left a 6 mm shallow ulcer that will heal with secondary intention.  I was able to achieve hemostasis with Drysol and a Band-Aid.  Patient will see general surgery to have a lipoma excised from her back.

## 2023-06-08 ENCOUNTER — Other Ambulatory Visit: Payer: Self-pay | Admitting: Primary Care

## 2023-06-15 ENCOUNTER — Ambulatory Visit: Payer: Medicare Other | Admitting: Surgery

## 2023-06-22 ENCOUNTER — Encounter: Payer: Self-pay | Admitting: Surgery

## 2023-06-22 ENCOUNTER — Ambulatory Visit: Payer: Medicare Other | Admitting: Surgery

## 2023-06-22 VITALS — BP 160/81 | HR 57 | Temp 97.8°F | Resp 14 | Ht 62.0 in | Wt 155.0 lb

## 2023-06-22 DIAGNOSIS — D171 Benign lipomatous neoplasm of skin and subcutaneous tissue of trunk: Secondary | ICD-10-CM

## 2023-06-22 NOTE — Progress Notes (Signed)
[facility] History and Physical  Reason for Referral: Back lipoma Referring Physician: Dr. Tanya [name]  Chief Complaint   New Patient (Initial Visit)     [name] is a 62 y.o. female.  HPI: Patient presents for evaluation of low back lipoma.  She first noticed the area 3 years ago, and since then, it has been increasing in size.  It occasionally will itch but does not cause her any pain.  She denies ever having anything like this in the past.  She does have a history of a cyst on her back, but she does not recall what if anything was done for this cyst.  Her past medical history is significant for IBS with constipation, hypertension, hyperlipidemia, GERD, and COPD.  She denies use of blood thinning medications.  She denies any history of abdominal surgeries or lumps or bumps being removed.  She does have a history of numerous right upper extremity and hand surgeries for an accident and basal cell carcinoma on her face and neck.  She drinks 2 to 3 glasses of wine a day, but denies use of tobacco products and illicit drugs.  Past Medical History:  Diagnosis Date   Anxiety    Aortic atherosclerosis (HCC)    Arthritis    Asthma    Bed bug bite    Blood transfusion without reported diagnosis    CA - skin cancer    Chronic pain due to injury RIGHT HAND/WRIST IN 2007   Complication of anesthesia    unable to urinate after surgery   COPD (chronic obstructive pulmonary disease) (HCC)    Stage 1   Crush injury of hand RIGHT IN 2007   X 6 SURG'S  LAST ONE BEING TOTAL WRIST FUSION W/ BONE GRAFT AND PLATE   Cubital tunnel syndrome    Left elbow   Depression    ETOH abuse    GERD (gastroesophageal reflux disease)    05/11/17- not recently   Hemorrhoids    History of kidney stones    passed   MRSA (methicillin resistant Staphylococcus aureus) carrier    Neuromuscular disorder (HCC)    some fibromyalgia   Neuropathy    Osteopenia 07/2019   T score -1.9 FRAX 8.2% /  1.2%   PTSD (post-traumatic stress disorder) 2007 RIGHT HAND CRUSH INJURY W/ 2 FINGER AMPUTATION   Stroke (HCC) X2 CVA'S  IN 1990   LOSS OF VISION RIGHT Peripherial bilteral   Substance abuse (HCC)    hx: narcotic use   UC (ulcerative colitis) (HCC)    Weakness of right leg CAUSED BY RETRIEVAL THIGH MUSCLE FOR FLAP RIGHT HAND INJURY-- 2006   RIGHT LEG GIVES OUT OCCASIONALLY    Past Surgical History:  Procedure Laterality Date   AMPUTATION Left 05/13/2017   Procedure: Left small finger revision amputation ;  Surgeon: Dominica Severin, MD;  Location: [facility];  Service: Orthopedics;  Laterality: Left;  60 mins   CARPAL TUNNEL RELEASE Left 12/07/2020   Procedure: LEFT CARPAL TUNNEL RELEASE;  Surgeon: Eldred Manges, MD;  Location: [facility];  Service: Orthopedics;  Laterality: Left;   COLON SURGERY     15 years ago- fistula on colon removed   COSMETIC SURGERY     right leg and abdomen   DILATATION & CURETTAGE/HYSTEROSCOPY WITH MYOSURE N/A 06/14/2016   Procedure: DILATATION & CURETTAGE/HYSTEROSCOPY WITH MYOSURE;  Surgeon: Dara Lords, MD;  Location: [facility];  Service: Gynecology;  Laterality: N/A;   HAND CARPECTOMY  12/24/2005   RIGHT-- INCLUDING  REMOVAL  WRIST WIRES AND I &D (EXICIOSNAL)  FOR OSTEOMYELITIS   HYSTEROSCOPY WITH D & C  08/18/2011   Procedure: DILATATION AND CURETTAGE (D&C) /HYSTEROSCOPY;  Surgeon: Dara Lords, MD;  Location: [facility];  Service: Gynecology;  Laterality: N/A;   INCISION / DRAINAGE HAND / FINGER  X3  (2006 & 2007)   RIGHT CRUSH WOUND   LESION REMOVAL  08/18/2011   Procedure: EXCISION VAGINAL LESION;  Surgeon: Dara Lords, MD;  Location: [facility];  Service: Gynecology;  Laterality: N/A;   ORIF CARPAL BONE FRACTURE  08/31/2005   I & D OF RIGHT HAND CRUSH INJURY/ OPEN FX'S AND INDEX FINGER RAY RESECTION/ COMPLICATED CLOSURE   ORIF RADIAL FRACTURE  09/04/2005   DISTAL RADIAL PLATE AND PARTIAL  THUMB AMPUTATION   RIGHT TOTAL WRIST FUSION W/ BONE GRAFT AND PLATE  16/06/9603   AND FLAP USING RIGHT GOIN MUSCLE   skin cancer removal  2007   throat area   TUBAL LIGATION  15 YRS AGO    Family History  Problem Relation Age of Onset   Lupus Mother        died at 61   Breast cancer Maternal Aunt 35   Breast cancer Maternal Grandmother 33   Stomach cancer Paternal Grandfather    Colon cancer Neg Hx    Rectal cancer Neg Hx    Esophageal cancer Neg Hx    Inflammatory bowel disease Neg Hx    Liver disease Neg Hx    Pancreatic cancer Neg Hx     Social History   Tobacco Use   Smoking status: Former    Current packs/day: 0.00    Average packs/day: 1.5 packs/day for 28.0 years (42.0 ttl pk-yrs)    Types: Cigarettes    Start date: 11/29/1973    Quit date: 11/29/2001    Years since quitting: 21.5   Smokeless tobacco: Never  Vaping Use   Vaping status: Never Used  Substance Use Topics   Alcohol use: Yes    Comment: wine daily   Drug use: Yes    Types: Marijuana    Comment: last smoked 12-08-20    Medications: I have reviewed the patient's current medications. Allergies as of 06/22/2023       Reactions   Aspirin Shortness Of Breath   ASTHMATIC ATTACK   Breztri Aerosphere [budeson-glycopyrrol-formoterol] Other (See Comments)   Urinary retention   Codeine Other (See Comments)   ABNORMAL BEHAVIOR  Increased in bad moods NO CODEINE BASED MEDICATIONS   Vicodin [hydrocodone-acetaminophen] Other (See Comments)   Extreme anxiety        Medication List        Accurate as of June 22, 2023  2:05 PM. If you have any questions, ask your nurse or doctor.          Advair HFA 230-21 MCG/ACT inhaler Generic drug: fluticasone-salmeterol INHALE 2 PUFFS INTO THE LUNGS 2 (TWO) TIMES DAILY.   AeroChamber MV inhaler Use as instructed   albuterol 108 (90 Base) MCG/ACT inhaler Commonly known as: VENTOLIN HFA TAKE 2 PUFFS BY MOUTH EVERY 6 HOURS AS NEEDED FOR WHEEZE OR  SHORTNESS OF BREATH   EPINEPHrine 0.3 mg/0.3 mL Soaj injection Commonly known as: EPI-PEN Inject into the muscle.   estradiol 0.1 MG/GM vaginal cream Commonly known as: ESTRACE Place vaginally.   famotidine 20 MG tablet Commonly known as: PEPCID TAKE 1 TABLET BY MOUTH EVERYDAY AT BEDTIME  Linzess 145 MCG Caps capsule Generic drug: linaclotide TAKE 1 CAPSULE BY MOUTH DAILY BEFORE BREAKFAST.   methocarbamol 500 MG tablet Commonly known as: ROBAXIN Take 1 tablet (500 mg total) by mouth 4 (four) times daily.   pantoprazole 40 MG tablet Commonly known as: PROTONIX TAKE 1 TABLET BY MOUTH EVERY DAY   rosuvastatin 20 MG tablet Commonly known as: Crestor Take 1 tablet (20 mg total) by mouth daily.   valsartan 80 MG tablet Commonly known as: DIOVAN Take 1 tablet (80 mg total) by mouth daily.         ROS:  Constitutional: negative for chills, fatigue, and fevers Eyes: negative for visual disturbance and pain Ears, nose, mouth, throat, and face: negative for ear drainage, sore throat, and sinus problems Respiratory: positive for shortness of breath, negative for cough and wheezing Cardiovascular: negative for chest pain and palpitations Gastrointestinal: negative for abdominal pain, nausea, reflux symptoms, and vomiting Genitourinary:negative for dysuria and frequency Integument/breast: negative for dryness and rash Hematologic/lymphatic: negative for bleeding and lymphadenopathy Musculoskeletal:negative for back pain and neck pain Neurological: negative for dizziness and tremors Endocrine: negative for temperature intolerance  Blood pressure (!) 160/81, pulse (!) 57, temperature 97.8 F (36.6 C), temperature source Oral, resp. rate 14, height 5\' 2"  (1.575 m), weight 155 lb (70.3 kg), SpO2 98%. Physical Exam Vitals reviewed.  Constitutional:      Appearance: Normal appearance.  HENT:     Head: Normocephalic and atraumatic.  Eyes:     Extraocular Movements:  Extraocular movements intact.     Pupils: Pupils are equal, round, and reactive to light.  Cardiovascular:     Rate and Rhythm: Normal rate and regular rhythm.  Pulmonary:     Effort: Pulmonary effort is normal.     Breath sounds: Normal breath sounds.  Abdominal:     General: There is no distension.     Palpations: Abdomen is soft.     Tenderness: There is no abdominal tenderness.  Musculoskeletal:        General: Normal range of motion.     Cervical back: Normal range of motion.  Skin:    General: Skin is warm and dry.     Comments: Palpable 4 cm soft and mobile mass in the midline mid back, likely lipoma  Neurological:     General: No focal deficit present.     Mental Status: She is alert and oriented to person, place, and time.  Psychiatric:        Mood and Affect: Mood normal.        Behavior: Behavior normal.     Results: No results found for this or any previous visit (from the past 48 hour(s)).  No results found.   Assessment & Plan:  [name] is a 62 y.o. female who presents for evaluation of a back lipoma.  -I explained the pathophysiology of lipomas, and when we recommend excision, specifically when they are increasing in size or causing other discomfort. -The risk and benefits of excision of back lipoma were discussed including but not limited to bleeding, infection, injury to surrounding structures, seroma formation, and need for additional procedures.  After careful consideration, [name] has decided to proceed with surgery. -Will plan to excise the lipoma in the operating room given its size.  Patient tentatively scheduled for surgery on 11/5 -Information provided to the patient regarding lipomas and lipoma removal  All questions were answered to the satisfaction of the patient.  Theophilus Kinds, DO [facility] [address] [place], [place] [address] [phone] (office)

## 2023-06-23 NOTE — H&P (Signed)
[name]  Reason for Referral: Back lipoma Referring Physician: Dr. Tanya Nones  Chief Complaint   New Patient (Initial Visit)     [name] is a 62 y.o. female.  HPI: Patient presents for evaluation of low back lipoma.  She first noticed the area 3 years ago, and since then, it has been increasing in size.  It occasionally will itch but does not cause her any pain.  She denies ever having anything like this in the past.  She does have a history of a cyst on her back, but she does not recall what if anything was done for this cyst.  Her past medical history is significant for IBS with constipation, hypertension, hyperlipidemia, GERD, and COPD.  She denies use of blood thinning medications.  She denies any history of abdominal surgeries or lumps or bumps being removed.  She does have a history of numerous right upper extremity and hand surgeries for an accident and basal cell carcinoma on her face and neck.  She drinks 2 to 3 glasses of wine a day, but denies use of tobacco products and illicit drugs.  Past Medical History:  Diagnosis Date   Anxiety    Aortic atherosclerosis (HCC)    Arthritis    Asthma    Bed bug bite    Blood transfusion without reported diagnosis    CA - skin cancer    Chronic pain due to injury RIGHT HAND/WRIST IN 2007   Complication of anesthesia    unable to urinate after surgery   COPD (chronic obstructive pulmonary disease) (HCC)    Stage 1   Crush injury of hand RIGHT IN 2007   X 6 SURG'S  LAST ONE BEING TOTAL WRIST FUSION W/ BONE GRAFT AND PLATE   Cubital tunnel syndrome    Left elbow   Depression    ETOH abuse    GERD (gastroesophageal reflux disease)    05/11/17- not recently   Hemorrhoids    History of kidney stones    passed   MRSA (methicillin resistant Staphylococcus aureus) carrier    Neuromuscular disorder (HCC)    some fibromyalgia   Neuropathy    Osteopenia 07/2019   T score -1.9 FRAX 8.2% /  1.2%   PTSD (post-traumatic stress disorder) 2007 RIGHT HAND CRUSH INJURY W/ 2 FINGER AMPUTATION   Stroke (HCC) X2 CVA'S  IN 1990   LOSS OF VISION RIGHT Peripherial bilteral   Substance abuse (HCC)    hx: narcotic use   UC (ulcerative colitis) (HCC)    Weakness of right leg CAUSED BY RETRIEVAL THIGH MUSCLE FOR FLAP RIGHT HAND INJURY-- 2006   RIGHT LEG GIVES OUT OCCASIONALLY    Past Surgical History:  Procedure Laterality Date   AMPUTATION Left 05/13/2017   Procedure: Left small finger revision amputation ;  Surgeon: Dominica Severin, MD;  Location: [facility];  Service: Orthopedics;  Laterality: Left;  60 mins   CARPAL TUNNEL RELEASE Left 12/07/2020   Procedure: LEFT CARPAL TUNNEL RELEASE;  Surgeon: Eldred Manges, MD;  Location: [facility];  Service: Orthopedics;  Laterality: Left;   COLON SURGERY     15 years ago- fistula on colon removed   COSMETIC SURGERY     right leg and abdomen   DILATATION & CURETTAGE/HYSTEROSCOPY WITH MYOSURE N/A 06/14/2016   Procedure: DILATATION & CURETTAGE/HYSTEROSCOPY WITH MYOSURE;  Surgeon: Dara Lords, MD;  Location: [facility];  Service: Gynecology;  Laterality: N/A;   HAND CARPECTOMY  12/24/2005   RIGHT-- INCLUDING  REMOVAL  WRIST WIRES AND I &D (EXICIOSNAL)  FOR OSTEOMYELITIS   HYSTEROSCOPY WITH D & C  08/18/2011   Procedure: DILATATION AND CURETTAGE (D&C) /HYSTEROSCOPY;  Surgeon: Dara Lords, MD;  Location: [facility];  Service: Gynecology;  Laterality: N/A;   INCISION / DRAINAGE HAND / FINGER  X3  (2006 & 2007)   RIGHT CRUSH WOUND   LESION REMOVAL  08/18/2011   Procedure: EXCISION VAGINAL LESION;  Surgeon: Dara Lords, MD;  Location: [facility];  Service: Gynecology;  Laterality: N/A;   ORIF CARPAL BONE FRACTURE  08/31/2005   I & D OF RIGHT HAND CRUSH INJURY/ OPEN FX'S AND INDEX FINGER RAY RESECTION/ COMPLICATED CLOSURE   ORIF RADIAL FRACTURE  09/04/2005   DISTAL RADIAL PLATE AND PARTIAL  THUMB AMPUTATION   RIGHT TOTAL WRIST FUSION W/ BONE GRAFT AND PLATE  16/06/9603   AND FLAP USING RIGHT GOIN MUSCLE   skin cancer removal  2007   throat area   TUBAL LIGATION  15 YRS AGO    Family History  Problem Relation Age of Onset   Lupus Mother        died at 61   Breast cancer Maternal Aunt 35   Breast cancer Maternal Grandmother 33   Stomach cancer Paternal Grandfather    Colon cancer Neg Hx    Rectal cancer Neg Hx    Esophageal cancer Neg Hx    Inflammatory bowel disease Neg Hx    Liver disease Neg Hx    Pancreatic cancer Neg Hx     Social History   Tobacco Use   Smoking status: Former    Current packs/day: 0.00    Average packs/day: 1.5 packs/day for 28.0 years (42.0 ttl pk-yrs)    Types: Cigarettes    Start date: 11/29/1973    Quit date: 11/29/2001    Years since quitting: 21.5   Smokeless tobacco: Never  Vaping Use   Vaping status: Never Used  Substance Use Topics   Alcohol use: Yes    Comment: wine daily   Drug use: Yes    Types: Marijuana    Comment: last smoked 12-08-20    Medications: I have reviewed the patient's current medications. Allergies as of 06/22/2023       Reactions   Aspirin Shortness Of Breath   ASTHMATIC ATTACK   Breztri Aerosphere [budeson-glycopyrrol-formoterol] Other (See Comments)   Urinary retention   Codeine Other (See Comments)   ABNORMAL BEHAVIOR  Increased in bad moods NO CODEINE BASED MEDICATIONS   Vicodin [hydrocodone-acetaminophen] Other (See Comments)   Extreme anxiety        Medication List        Accurate as of June 22, 2023  2:05 PM. If you have any questions, ask your nurse or doctor.          Advair HFA 230-21 MCG/ACT inhaler Generic drug: fluticasone-salmeterol INHALE 2 PUFFS INTO THE LUNGS 2 (TWO) TIMES DAILY.   AeroChamber MV inhaler Use as instructed   albuterol 108 (90 Base) MCG/ACT inhaler Commonly known as: VENTOLIN HFA TAKE 2 PUFFS BY MOUTH EVERY 6 HOURS AS NEEDED FOR WHEEZE OR  SHORTNESS OF BREATH   EPINEPHrine 0.3 mg/0.3 mL Soaj injection Commonly known as: EPI-PEN Inject into the muscle.   estradiol 0.1 MG/GM vaginal cream Commonly known as: ESTRACE Place vaginally.   famotidine 20 MG tablet Commonly known as: PEPCID TAKE 1 TABLET BY MOUTH EVERYDAY AT BEDTIME  Linzess 145 MCG Caps capsule Generic drug: linaclotide TAKE 1 CAPSULE BY MOUTH DAILY BEFORE BREAKFAST.   methocarbamol 500 MG tablet Commonly known as: ROBAXIN Take 1 tablet (500 mg total) by mouth 4 (four) times daily.   pantoprazole 40 MG tablet Commonly known as: PROTONIX TAKE 1 TABLET BY MOUTH EVERY DAY   rosuvastatin 20 MG tablet Commonly known as: Crestor Take 1 tablet (20 mg total) by mouth daily.   valsartan 80 MG tablet Commonly known as: DIOVAN Take 1 tablet (80 mg total) by mouth daily.         ROS:  Constitutional: negative for chills, fatigue, and fevers Eyes: negative for visual disturbance and pain Ears, nose, mouth, throat, and face: negative for ear drainage, sore throat, and sinus problems Respiratory: positive for shortness of breath, negative for cough and wheezing Cardiovascular: negative for chest pain and palpitations Gastrointestinal: negative for abdominal pain, nausea, reflux symptoms, and vomiting Genitourinary:negative for dysuria and frequency Integument/breast: negative for dryness and rash Hematologic/lymphatic: negative for bleeding and lymphadenopathy Musculoskeletal:negative for back pain and neck pain Neurological: negative for dizziness and tremors Endocrine: negative for temperature intolerance  Blood pressure (!) 160/81, pulse (!) 57, temperature 97.8 F (36.6 C), temperature source Oral, resp. rate 14, height 5\' 2"  (1.575 m), weight 155 lb (70.3 kg), SpO2 98%. [name] Exam Vitals reviewed.  Constitutional:      Appearance: Normal appearance.  HENT:     Head: Normocephalic and atraumatic.  Eyes:     Extraocular Movements:  Extraocular movements intact.     Pupils: Pupils are equal, round, and reactive to light.  Cardiovascular:     Rate and Rhythm: Normal rate and regular rhythm.  Pulmonary:     Effort: Pulmonary effort is normal.     Breath sounds: Normal breath sounds.  Abdominal:     General: There is no distension.     Palpations: Abdomen is soft.     Tenderness: There is no abdominal tenderness.  Musculoskeletal:        General: Normal range of motion.     Cervical back: Normal range of motion.  Skin:    General: Skin is warm and dry.     Comments: Palpable 4 cm soft and mobile mass in the midline mid back, likely lipoma  Neurological:     General: No focal deficit present.     Mental Status: She is alert and oriented to person, place, and time.  Psychiatric:        Mood and Affect: Mood normal.        Behavior: Behavior normal.     Results: No results found for this or any previous visit (from the past 48 hour(s)).  No results found.   Assessment & Plan:  [name] is a 62 y.o. female who presents for evaluation of a back lipoma.  -I explained the pathophysiology of lipomas, and when we recommend excision, specifically when they are increasing in size or causing other discomfort. -The risk and benefits of excision of back lipoma were discussed including but not limited to bleeding, infection, injury to surrounding structures, seroma formation, and need for additional procedures.  After careful consideration, [name] has decided to proceed with surgery. -Will plan to excise the lipoma in the operating room given its size.  Patient tentatively scheduled for surgery on 11/5 -Information provided to the patient regarding lipomas and lipoma removal  All questions were answered to the satisfaction of the patient.  Theophilus Kinds, DO [facility] [address] [place] [place], [place] [address] [phone] (office)

## 2023-06-26 ENCOUNTER — Other Ambulatory Visit: Payer: Self-pay | Admitting: Primary Care

## 2023-06-26 ENCOUNTER — Other Ambulatory Visit: Payer: Self-pay | Admitting: Family Medicine

## 2023-06-27 NOTE — Telephone Encounter (Signed)
Requested Prescriptions  Pending Prescriptions Disp Refills   valsartan (DIOVAN) 80 MG tablet [Pharmacy Med Name: VALSARTAN 80 MG TABLET] 90 tablet 0    Sig: TAKE 1 TABLET BY MOUTH EVERY DAY     Cardiovascular:  Angiotensin Receptor Blockers Failed - 06/26/2023 10:38 AM      Failed - Last BP in normal range    BP Readings from Last 1 Encounters:  06/22/23 (!) 160/81         Failed - Valid encounter within last 6 months    Recent Outpatient Visits           1 year ago Colon cancer screening   [facility] Tanya Nones, Priscille Heidelberg, MD   1 year ago Diverticulitis   [facility] Donita Brooks, MD   2 years ago Dyspnea, unspecified type   [facility] Donita Brooks, MD   2 years ago Chest pain, unspecified type   [facility] Donita Brooks, MD   2 years ago Generalized abdominal pain   [facility] Tanya Nones, Priscille Heidelberg, MD              Passed - Cr in normal range and within 180 days    Creat  Date Value Ref Range Status  02/09/2023 0.76 0.50 - 1.05 mg/dL Final         Passed - K in normal range and within 180 days    Potassium  Date Value Ref Range Status  02/09/2023 4.3 3.5 - 5.3 mmol/L Final         Passed - Patient is not pregnant       pantoprazole (PROTONIX) 40 MG tablet [Pharmacy Med Name: PANTOPRAZOLE SOD DR 40 MG TAB] 90 tablet 0    Sig: TAKE 1 TABLET BY MOUTH EVERY DAY     Gastroenterology: Proton Pump Inhibitors Failed - 06/26/2023 10:38 AM      Failed - Valid encounter within last 12 months    Recent Outpatient Visits           1 year ago Colon cancer screening   [facility] Donita Brooks, MD   1 year ago Diverticulitis   [facility] Donita Brooks, MD   2 years ago Dyspnea, unspecified type   [facility] Donita Brooks, MD   2 years ago Chest pain, unspecified type   [facility] Donita Brooks, MD   2 years ago Generalized abdominal pain   [facility] Pickard, Priscille Heidelberg, MD

## 2023-07-10 ENCOUNTER — Other Ambulatory Visit: Payer: Self-pay | Admitting: Family Medicine

## 2023-07-10 DIAGNOSIS — Z0000 Encounter for general adult medical examination without abnormal findings: Secondary | ICD-10-CM

## 2023-07-12 NOTE — Patient Instructions (Addendum)
[name]  07/12/2023     @PREFPERIOPPHARMACY @   Your procedure is scheduled on 07/18/2023.  Report to [facility] ([facility] at 6:00 A.M.  Call this number if you have problems the morning of surgery:  [phone]  If you experience any cold or flu symptoms such as cough, fever, chills, shortness of breath, etc. between now and your scheduled surgery, please notify us at the above number.   Remember:   Do not eat after midnight.   You may drink clear liquids until 3:30am .    Clear liquids allowed are:                    Water, Carbonated beverages (diabetics please choose diet or no sugar options), Clear Tea (No creamer, milk, or cream, including half & half and powdered creamer), and Clear Sports drink (No red color; diabetics please choose diet or no sugar options)    Take these medicines the morning of surgery with A SIP OF WATER : Pepcid Pantoprazole   Please use your Albuterol Inhaler before you come and bring your rescue inhaler with you.    Do not wear jewelry, make-up or nail polish, including gel polish,  artificial nails, or any other type of covering on natural nails (fingers and  toes).  Do not wear lotions, powders, or perfumes, or deodorant.  Do not shave 48 hours prior to surgery.  Men may shave face and neck.  Do not bring valuables to the hospital.  [facility] is not responsible for any belongings or valuables.  Contacts, dentures or bridgework may not be worn into surgery.  Leave your suitcase in the car.  After surgery it may be brought to your room.  For patients admitted to the hospital, discharge time will be determined by your treatment team.  Patients discharged the day of surgery will not be allowed to drive home.   Name and phone number of your driver:   Family Special instructions:  N/A  Please read over the following fact sheets that you were given. Care and Recovery After Surgery   Lipoma Removal  Lipoma removal is a surgical procedure to  remove a lipoma, which is a noncancerous (benign) tumor that is made up of fat cells. Most lipomas are small and painless and do not require treatment. They can form in many areas of the body but are most common under the skin of the back, arms, shoulders, buttocks, and thighs. You may need lipoma removal if you have a lipoma that is large, growing, or causing discomfort. Lipoma removal may also be done for cosmetic reasons. Tell a health care provider about: Any allergies you have. All medicines you are taking, including vitamins, herbs, eye drops, creams, and over-the-counter medicines. Any problems you or family members have had with anesthetic medicines. Any bleeding problems you have. Any surgeries you have had. Any medical conditions you have. Whether you are pregnant or may be pregnant. What are the risks? Generally, this is a safe procedure. However, problems may occur, including: Infection. Bleeding. Scarring. Allergic reactions to medicines. Damage to nearby structures or organs, such as damage to nerves or blood vessels near the lipoma. What happens before the procedure? When to Stop Eating and Drinking Follow instructions from your health care provider about what you may eat and drink before your procedure. These may include: 8 hours before your procedure Stop eating most foods. Do not eat meat, fried foods, or fatty foods. Eat only light foods,  such as toast or crackers. All liquids are okay except energy drinks and alcohol. 6 hours before your procedure Stop eating. Drink only clear liquids, such as water, clear fruit juice, black coffee, plain tea, and sports drinks. Do not drink energy drinks or alcohol. 2 hours before your procedure Stop drinking all liquids. You may be allowed to take medicines with small sips of water. If you do not follow your health care provider's instructions, your procedure may be delayed or canceled. Medicines Ask your health care provider  about: Changing or stopping your regular medicines. This is especially important if you are taking diabetes medicines or blood thinners. Taking medicines such as aspirin and ibuprofen. These medicines can thin your blood. Do not take these medicines unless your health care provider tells you to take them. Taking over-the-counter medicines, vitamins, herbs, and supplements. General instructions You will have a physical exam. Your health care provider will check the size of the lipoma and whether it can be removed easily. You may have a biopsy and imaging tests, such as X-rays, a CT scan, and an MRI. Do not use any products that contain nicotine or tobacco for at least 4 weeks before the procedure. These products include cigarettes, chewing tobacco, and vaping devices, such as e-cigarettes. If you need help quitting, ask your health care provider. Ask your health care provider: How your surgery site will be marked. What steps will be taken to help prevent infection. These may include: Washing skin with a germ-killing soap. Taking antibiotic medicine. If you will be going home right after the procedure, plan to have a responsible adult: Take you home from the hospital or clinic. You will not be allowed to drive. Care for you for the time you are told. What happens during the procedure?  An IV will be inserted into one of your veins. You will be given one or more of the following: A medicine to help you relax (sedative). A medicine to numb the area (local anesthetic). A medicine to make you fall asleep (general anesthetic). A medicine that is injected into an area of your body to numb everything below the injection site (regional anesthetic). An incision will be made into the skin over the lipoma or very near the lipoma. The incision may be made in a natural skin line or crease. Tissues, nerves, and blood vessels near the lipoma will be moved out of the way. The lipoma and the capsule that  surrounds it will be separated from the surrounding tissues. The lipoma will be removed. The incision may be closed with stitches (sutures). A bandage (dressing) will be placed over the incision. The procedure may vary among health care providers and hospitals. What happens after the procedure? Your blood pressure, heart rate, breathing rate, and blood oxygen level will be monitored until you leave the hospital or clinic. If you were prescribed an antibiotic medicine, use it as told by your health care provider. Do not stop using the antibiotic even if you start to feel better. If you were given a sedative during the procedure, it can affect you for several hours. Do not drive or operate machinery until your health care provider says that it is safe. Where to find more information OrthoInfo: orthoinfo.aaos.org Summary Before the procedure, follow instructions from your health care provider about eating and drinking, and changing or stopping your regular medicines. This is especially important if you are taking diabetes medicines or blood thinners. After the lipoma is removed, the incision may be  closed with stitches (sutures) and covered with a bandage (dressing). If you were given a sedative during the procedure, it can affect you for several hours. Do not drive or operate machinery until your health care provider says that it is safe. This information is not intended to replace advice given to you by your health care provider. Make sure you discuss any questions you have with your health care provider. Document Revised: 09/17/2021 Document Reviewed: 09/17/2021 Elsevier Patient Education  2024 [employer] Inc.  General Anesthesia, Adult General anesthesia is the use of medicine to make you fall asleep (unconscious) for a medical procedure. General anesthesia must be used for certain procedures. It is often recommended for surgery or procedures that: Last a long time. Require you to be still or  in an unusual position. Are major and can cause blood loss. Affect your breathing. The medicines used for general anesthesia are called general anesthetics. During general anesthesia, these medicines are given along with medicines that: Prevent pain. Control your blood pressure. Relax your muscles. Prevent nausea and vomiting after the procedure. Tell a health care provider about: Any allergies you have. All medicines you are taking, including vitamins, herbs, eye drops, creams, and over-the-counter medicines. Your history of any: Medical conditions you have, including: High blood pressure. Bleeding problems. Diabetes. Heart or lung conditions, such as: Heart failure. Sleep apnea. Asthma. Chronic obstructive pulmonary disease (COPD). Current or recent illnesses, such as: Upper respiratory, chest, or ear infections. Cough or fever. Tobacco or drug use, including marijuana or alcohol use. Depression or anxiety. Surgeries and types of anesthetics you have had. Problems you or family members have had with anesthetic medicines. Whether you are pregnant or may be pregnant. Whether you have any chipped or loose teeth, dentures, caps, bridgework, or issues with your mouth, swallowing, or choking. What are the risks? Your health care provider will talk with you about risks. These may include: Allergic reaction to the medicines. Lung and heart problems. Inhaling food or liquid from the stomach into the lungs (aspiration). Nerve injury. Injury to the lips, mouth, teeth, or gums. Stroke. Waking up during your procedure and being unable to move. This is rare. These problems are more likely to develop if you are having a major surgery or if you have an advanced or serious medical condition. You can prevent some of these complications by answering all of your health care provider's questions thoroughly and by following all instructions before your procedure. General anesthesia can cause side  effects, including: Nausea or vomiting. A sore throat or hoarseness from the breathing tube. Wheezing or coughing. Shaking chills or feeling cold. Body aches. Sleepiness. Confusion, agitation (delirium), or anxiety. What happens before the procedure? When to stop eating and drinking Follow instructions from your health care provider about what you may eat and drink before your procedure. If you do not follow your health care provider's instructions, your procedure may be delayed or canceled. Medicines Ask your health care provider about: Changing or stopping your regular medicines. These include any diabetes medicines or blood thinners you take. Taking medicines such as aspirin and ibuprofen. These medicines can thin your blood. Do not take them unless your health care provider tells you to. Taking over-the-counter medicines, vitamins, herbs, and supplements. General instructions Do not use any products that contain nicotine or tobacco for at least 4 weeks before the procedure. These products include cigarettes, chewing tobacco, and vaping devices, such as e-cigarettes. If you need help quitting, ask your health care provider. If  you brush your teeth on the morning of the procedure, make sure to spit out all of the water and toothpaste. If told by your health care provider, bring your sleep apnea device with you to surgery (if applicable). If you will be going home right after the procedure, plan to have a responsible adult: Take you home from the hospital or clinic. You will not be allowed to drive. Care for you for the time you are told. What happens during the procedure?  An IV will be inserted into one of your veins. You will be given one or more of the following through a face mask or IV: A sedative. This helps you relax. Anesthesia. This will: Numb certain areas of your body. Make you fall asleep for surgery. After you are unconscious, a breathing tube may be inserted down your  throat to help you breathe. This will be removed before you wake up. An anesthesia provider, such as an anesthesiologist, will stay with you throughout your procedure. The anesthesia provider will: Keep you comfortable and safe by continuing to give you medicines and adjusting the amount of medicine that you get. Monitor your blood pressure, heart rate, and oxygen levels to make sure that the anesthetics do not cause any problems. The procedure may vary among health care providers and hospitals. What happens after the procedure? Your blood pressure, temperature, heart rate, breathing rate, and blood oxygen level will be monitored until you leave the hospital or clinic. You will wake up in a recovery area. You may wake up slowly. You may be given medicine to help you with pain, nausea, or any other side effects from the anesthesia. Summary General anesthesia is the use of medicine to make you fall asleep (unconscious) for a medical procedure. Follow your health care provider's instructions about when to stop eating, drinking, or taking certain medicines before your procedure. Plan to have a responsible adult take you home from the hospital or clinic. This information is not intended to replace advice given to you by your health care provider. Make sure you discuss any questions you have with your health care provider. Document Revised: 11/25/2021 Document Reviewed: 11/25/2021 Elsevier Patient Education  2024 Elsevier Inc. How to Use Chlorhexidine Before Surgery Chlorhexidine gluconate (CHG) is a germ-killing (antiseptic) solution that is used to clean the skin. It can get rid of the bacteria that normally live on the skin and can keep them away for about 24 hours. To clean your skin with CHG, you may be given: A CHG solution to use in the shower or as part of a sponge bath. A prepackaged cloth that contains CHG. Cleaning your skin with CHG may help lower the risk for infection: While you are  staying in the intensive care unit of the hospital. If you have a vascular access, such as a central line, to provide short-term or long-term access to your veins. If you have a catheter to drain urine from your bladder. If you are on a ventilator. A ventilator is a machine that helps you breathe by moving air in and out of your lungs. After surgery. What are the risks? Risks of using CHG include: A skin reaction. Hearing loss, if CHG gets in your ears and you have a perforated eardrum. Eye injury, if CHG gets in your eyes and is not rinsed out. The CHG product catching fire. Make sure that you avoid smoking and flames after applying CHG to your skin. Do not use CHG: If you have a  chlorhexidine allergy or have previously reacted to chlorhexidine. On babies younger than 86 months of age. How to use CHG solution Use CHG only as told by your health care provider, and follow the instructions on the label. Use the full amount of CHG as directed. Usually, this is one bottle. During a shower Follow these steps when using CHG solution during a shower (unless your health care provider gives you different instructions): Start the shower. Use your normal soap and shampoo to wash your face and hair. Turn off the shower or move out of the shower stream. Pour the CHG onto a clean washcloth. Do not use any type of brush or rough-edged sponge. Starting at your neck, lather your body down to your toes. Make sure you follow these instructions: If you will be having surgery, pay special attention to the part of your body where you will be having surgery. Scrub this area for at least 1 minute. Do not use CHG on your head or face. If the solution gets into your ears or eyes, rinse them well with water. Avoid your genital area. Avoid any areas of skin that have broken skin, cuts, or scrapes. Scrub your back and under your arms. Make sure to wash skin folds. Let the lather sit on your skin for 1-2 minutes or  as long as told by your health care provider. Thoroughly rinse your entire body in the shower. Make sure that all body creases and crevices are rinsed well. Dry off with a clean towel. Do not put any substances on your body afterward--such as powder, lotion, or perfume--unless you are told to do so by your health care provider. Only use lotions that are recommended by the manufacturer. Put on clean clothes or pajamas. If it is the night before your surgery, sleep in clean sheets.  During a sponge bath Follow these steps when using CHG solution during a sponge bath (unless your health care provider gives you different instructions): Use your normal soap and shampoo to wash your face and hair. Pour the CHG onto a clean washcloth. Starting at your neck, lather your body down to your toes. Make sure you follow these instructions: If you will be having surgery, pay special attention to the part of your body where you will be having surgery. Scrub this area for at least 1 minute. Do not use CHG on your head or face. If the solution gets into your ears or eyes, rinse them well with water. Avoid your genital area. Avoid any areas of skin that have broken skin, cuts, or scrapes. Scrub your back and under your arms. Make sure to wash skin folds. Let the lather sit on your skin for 1-2 minutes or as long as told by your health care provider. Using a different clean, wet washcloth, thoroughly rinse your entire body. Make sure that all body creases and crevices are rinsed well. Dry off with a clean towel. Do not put any substances on your body afterward--such as powder, lotion, or perfume--unless you are told to do so by your health care provider. Only use lotions that are recommended by the manufacturer. Put on clean clothes or pajamas. If it is the night before your surgery, sleep in clean sheets. How to use CHG prepackaged cloths Only use CHG cloths as told by your health care provider, and follow the  instructions on the label. Use the CHG cloth on clean, dry skin. Do not use the CHG cloth on your head or face unless your  health care provider tells you to. When washing with the CHG cloth: Avoid your genital area. Avoid any areas of skin that have broken skin, cuts, or scrapes. Before surgery Follow these steps when using a CHG cloth to clean before surgery (unless your health care provider gives you different instructions): Using the CHG cloth, vigorously scrub the part of your body where you will be having surgery. Scrub using a back-and-forth motion for 3 minutes. The area on your body should be completely wet with CHG when you are done scrubbing. Do not rinse. Discard the cloth and let the area air-dry. Do not put any substances on the area afterward, such as powder, lotion, or perfume. Put on clean clothes or pajamas. If it is the night before your surgery, sleep in clean sheets.  For general bathing Follow these steps when using CHG cloths for general bathing (unless your health care provider gives you different instructions). Use a separate CHG cloth for each area of your body. Make sure you wash between any folds of skin and between your fingers and toes. Wash your body in the following order, switching to a new cloth after each step: The front of your neck, shoulders, and chest. Both of your arms, under your arms, and your hands. Your stomach and groin area, avoiding the genitals. Your right leg and foot. Your left leg and foot. The back of your neck, your back, and your buttocks. Do not rinse. Discard the cloth and let the area air-dry. Do not put any substances on your body afterward--such as powder, lotion, or perfume--unless you are told to do so by your health care provider. Only use lotions that are recommended by the manufacturer. Put on clean clothes or pajamas. Contact a health care provider if: Your skin gets irritated after scrubbing. You have questions about using  your solution or cloth. You swallow any chlorhexidine. Call your local poison control center (616-091-8676 in the [place] Get help right away if: Your eyes itch badly, or they become very red or swollen. Your skin itches badly and is red or swollen. Your hearing changes. You have trouble seeing. You have swelling or tingling in your mouth or throat. You have trouble breathing. These symptoms may represent a serious problem that is an emergency. Do not wait to see if the symptoms will go away. Get medical help right away. Call your local emergency services (911 in the [place] Do not drive yourself to the hospital. Summary Chlorhexidine gluconate (CHG) is a germ-killing (antiseptic) solution that is used to clean the skin. Cleaning your skin with CHG may help to lower your risk for infection. You may be given CHG to use for bathing. It may be in a bottle or in a prepackaged cloth to use on your skin. Carefully follow your health care provider's instructions and the instructions on the product label. Do not use CHG if you have a chlorhexidine allergy. Contact your health care provider if your skin gets irritated after scrubbing. This information is not intended to replace advice given to you by your health care provider. Make sure you discuss any questions you have with your health care provider. Document Revised: 12/27/2021 Document Reviewed: 11/09/2020 Elsevier Patient Education  2023 [employer].

## 2023-07-13 ENCOUNTER — Other Ambulatory Visit: Payer: Self-pay

## 2023-07-13 ENCOUNTER — Encounter (HOSPITAL_COMMUNITY)
Admission: RE | Admit: 2023-07-13 | Discharge: 2023-07-13 | Disposition: A | Discharge status: Home / Self Care | Payer: Medicare Other | Source: Ambulatory Visit | Attending: Surgery | Admitting: Surgery

## 2023-07-13 ENCOUNTER — Encounter (HOSPITAL_COMMUNITY): Payer: Self-pay

## 2023-07-13 DIAGNOSIS — I10 Essential (primary) hypertension: Secondary | ICD-10-CM | POA: Diagnosis not present

## 2023-07-13 DIAGNOSIS — Z01810 Encounter for preprocedural cardiovascular examination: Secondary | ICD-10-CM | POA: Diagnosis not present

## 2023-07-13 DIAGNOSIS — R9431 Abnormal electrocardiogram [ECG] [EKG]: Secondary | ICD-10-CM | POA: Insufficient documentation

## 2023-07-13 HISTORY — DX: Essential (primary) hypertension: I10

## 2023-07-18 ENCOUNTER — Ambulatory Visit (HOSPITAL_COMMUNITY): Payer: Medicare Other | Admitting: Anesthesiology

## 2023-07-18 ENCOUNTER — Ambulatory Visit (HOSPITAL_BASED_OUTPATIENT_CLINIC_OR_DEPARTMENT_OTHER): Payer: Medicare Other | Admitting: Anesthesiology

## 2023-07-18 ENCOUNTER — Encounter (HOSPITAL_COMMUNITY)
Admission: RE | Disposition: A | Discharge status: Home / Self Care | Payer: Self-pay | Source: Home / Self Care | Attending: Surgery

## 2023-07-18 ENCOUNTER — Ambulatory Visit (HOSPITAL_COMMUNITY)
Admission: RE | Admit: 2023-07-18 | Discharge: 2023-07-18 | Disposition: A | Discharge status: Home / Self Care | Payer: Medicare Other | Attending: Surgery | Admitting: Surgery

## 2023-07-18 ENCOUNTER — Encounter (HOSPITAL_COMMUNITY): Payer: Self-pay | Admitting: Surgery

## 2023-07-18 DIAGNOSIS — E785 Hyperlipidemia, unspecified: Secondary | ICD-10-CM | POA: Diagnosis not present

## 2023-07-18 DIAGNOSIS — K589 Irritable bowel syndrome without diarrhea: Secondary | ICD-10-CM | POA: Diagnosis not present

## 2023-07-18 DIAGNOSIS — J449 Chronic obstructive pulmonary disease, unspecified: Secondary | ICD-10-CM | POA: Diagnosis not present

## 2023-07-18 DIAGNOSIS — K219 Gastro-esophageal reflux disease without esophagitis: Secondary | ICD-10-CM | POA: Insufficient documentation

## 2023-07-18 DIAGNOSIS — Z87891 Personal history of nicotine dependence: Secondary | ICD-10-CM | POA: Diagnosis not present

## 2023-07-18 DIAGNOSIS — D171 Benign lipomatous neoplasm of skin and subcutaneous tissue of trunk: Secondary | ICD-10-CM | POA: Diagnosis not present

## 2023-07-18 DIAGNOSIS — I10 Essential (primary) hypertension: Secondary | ICD-10-CM

## 2023-07-18 DIAGNOSIS — J4489 Other specified chronic obstructive pulmonary disease: Secondary | ICD-10-CM | POA: Insufficient documentation

## 2023-07-18 DIAGNOSIS — Z85828 Personal history of other malignant neoplasm of skin: Secondary | ICD-10-CM | POA: Diagnosis not present

## 2023-07-18 HISTORY — PX: LIPOMA EXCISION: SHX5283

## 2023-07-18 SURGERY — EXCISION LIPOMA
Anesthesia: General | Site: Back

## 2023-07-18 MED ORDER — ORAL CARE MOUTH RINSE: 15.0000 mL | Freq: Once | OROMUCOSAL | Status: DC

## 2023-07-18 MED ORDER — MIDAZOLAM HCL 2 MG/2ML IJ SOLN
INTRAMUSCULAR | Status: AC
  Filled 2023-07-18: qty 2

## 2023-07-18 MED ORDER — OXYCODONE HCL 5 MG PO TABS: 5.0000 mg | ORAL_TABLET | Freq: Four times a day (QID) | ORAL | 0 refills | Status: DC | PRN

## 2023-07-18 MED ORDER — LACTATED RINGERS IV SOLN: INTRAVENOUS | Status: DC

## 2023-07-18 MED ORDER — DEXAMETHASONE SODIUM PHOSPHATE 10 MG/ML IJ SOLN
INTRAMUSCULAR | Status: AC
  Filled 2023-07-18: qty 1

## 2023-07-18 MED ORDER — DEXAMETHASONE SODIUM PHOSPHATE 10 MG/ML IJ SOLN
INTRAMUSCULAR | Status: DC | PRN
  Administered 2023-07-18: 5 mg via INTRAVENOUS

## 2023-07-18 MED ORDER — ACETAMINOPHEN 500 MG PO TABS: 1000.0000 mg | ORAL_TABLET | Freq: Four times a day (QID) | ORAL | 0 refills | Status: AC

## 2023-07-18 MED ORDER — BACITRACIN ZINC 500 UNIT/GM EX OINT
TOPICAL_OINTMENT | CUTANEOUS | Status: AC
  Filled 2023-07-18: qty 0.9

## 2023-07-18 MED ORDER — PROPOFOL 10 MG/ML IV BOLUS
INTRAVENOUS | Status: AC
  Filled 2023-07-18: qty 20

## 2023-07-18 MED ORDER — BUPIVACAINE HCL (PF) 0.5 % IJ SOLN
INTRAMUSCULAR | Status: AC
Start: 2023-07-18 — End: ?
  Filled 2023-07-18: qty 30

## 2023-07-18 MED ORDER — FENTANYL CITRATE (PF) 100 MCG/2ML IJ SOLN
INTRAMUSCULAR | Status: AC
  Filled 2023-07-18: qty 2

## 2023-07-18 MED ORDER — PROPOFOL 10 MG/ML IV BOLUS
INTRAVENOUS | Status: DC | PRN
  Administered 2023-07-18: 150 mg via INTRAVENOUS
  Administered 2023-07-18: 50 mg via INTRAVENOUS

## 2023-07-18 MED ORDER — ONDANSETRON HCL 4 MG/2ML IJ SOLN: 4.0000 mg | Freq: Once | INTRAMUSCULAR | Status: DC | PRN

## 2023-07-18 MED ORDER — ONDANSETRON HCL 4 MG/2ML IJ SOLN
INTRAMUSCULAR | Status: DC | PRN
  Administered 2023-07-18: 4 mg via INTRAVENOUS

## 2023-07-18 MED ORDER — FENTANYL CITRATE (PF) 100 MCG/2ML IJ SOLN
INTRAMUSCULAR | Status: DC | PRN
  Administered 2023-07-18: 100 ug via INTRAVENOUS

## 2023-07-18 MED ORDER — LIDOCAINE HCL (PF) 1 % IJ SOLN
INTRAMUSCULAR | Status: AC
  Filled 2023-07-18: qty 30

## 2023-07-18 MED ORDER — CHLORHEXIDINE GLUCONATE CLOTH 2 % EX PADS: 6.0000 | MEDICATED_PAD | Freq: Once | CUTANEOUS | Status: DC

## 2023-07-18 MED ORDER — FENTANYL CITRATE PF 50 MCG/ML IJ SOSY: 25.0000 ug | PREFILLED_SYRINGE | INTRAMUSCULAR | Status: DC | PRN

## 2023-07-18 MED ORDER — LIDOCAINE HCL 1 % IJ SOLN
INTRAMUSCULAR | Status: DC | PRN
  Administered 2023-07-18: 45 mL via INTRADERMAL

## 2023-07-18 MED ORDER — 0.9 % SODIUM CHLORIDE (POUR BTL) OPTIME
TOPICAL | Status: DC | PRN
  Administered 2023-07-18: 1000 mL

## 2023-07-18 MED ORDER — EPHEDRINE SULFATE (PRESSORS) 50 MG/ML IJ SOLN
INTRAMUSCULAR | Status: DC | PRN
  Administered 2023-07-18 (×3): 5 mg via INTRAVENOUS

## 2023-07-18 MED ORDER — EPHEDRINE 5 MG/ML INJ
INTRAVENOUS | Status: AC
  Filled 2023-07-18: qty 5

## 2023-07-18 MED ORDER — LACTATED RINGERS IV SOLN: INTRAVENOUS | Status: DC | PRN

## 2023-07-18 MED ORDER — MIDAZOLAM HCL 5 MG/5ML IJ SOLN
INTRAMUSCULAR | Status: DC | PRN
  Administered 2023-07-18: 2 mg via INTRAVENOUS

## 2023-07-18 MED ORDER — DOCUSATE SODIUM 100 MG PO CAPS: 100.0000 mg | ORAL_CAPSULE | Freq: Two times a day (BID) | ORAL | 2 refills | Status: DC

## 2023-07-18 MED ORDER — OXYCODONE HCL 5 MG/5ML PO SOLN: 5.0000 mg | Freq: Once | ORAL | Status: DC | PRN

## 2023-07-18 MED ORDER — OXYCODONE HCL 5 MG PO TABS: 5.0000 mg | ORAL_TABLET | Freq: Once | ORAL | Status: DC | PRN

## 2023-07-18 MED ORDER — ONDANSETRON HCL 4 MG/2ML IJ SOLN
INTRAMUSCULAR | Status: AC
Start: 2023-07-18 — End: ?
  Filled 2023-07-18: qty 2

## 2023-07-18 MED ORDER — CHLORHEXIDINE GLUCONATE 0.12 % MT SOLN: 15.0000 mL | Freq: Once | OROMUCOSAL | Status: DC

## 2023-07-18 SURGICAL SUPPLY — 29 items
APL PRP STRL LF DISP 70% ISPRP (MISCELLANEOUS) ×1
CHLORAPREP W/TINT 26 (MISCELLANEOUS) ×1 IMPLANT
CLOTH BEACON ORANGE TIMEOUT ST (SAFETY) ×1 IMPLANT
COVER LIGHT HANDLE STERIS (MISCELLANEOUS) ×2 IMPLANT
DRSG TEGADERM 4X4.75 (GAUZE/BANDAGES/DRESSINGS) ×1 IMPLANT
ELECT REM PT RETURN 9FT ADLT (ELECTROSURGICAL) ×1
ELECTRODE REM PT RTRN 9FT ADLT (ELECTROSURGICAL) ×1 IMPLANT
GAUZE SPONGE 2X2 8PLY STRL LF (GAUZE/BANDAGES/DRESSINGS) ×2 IMPLANT
GAUZE SPONGE 4X4 12PLY STRL (GAUZE/BANDAGES/DRESSINGS) IMPLANT
GAUZE XEROFORM 1X8 LF (GAUZE/BANDAGES/DRESSINGS) IMPLANT
GLOVE BIOGEL PI IND STRL 6.5 (GLOVE) ×1 IMPLANT
GLOVE BIOGEL PI IND STRL 7.0 (GLOVE) ×2 IMPLANT
GLOVE SURG SS PI 6.5 STRL IVOR (GLOVE) ×1 IMPLANT
GOWN STRL REUS W/TWL LRG LVL3 (GOWN DISPOSABLE) ×2 IMPLANT
KIT TURNOVER KIT A (KITS) ×1 IMPLANT
MANIFOLD NEPTUNE II (INSTRUMENTS) ×1 IMPLANT
NDL HYPO 25X1 1.5 SAFETY (NEEDLE) ×1 IMPLANT
NEEDLE HYPO 25X1 1.5 SAFETY (NEEDLE) ×1
NS IRRIG 1000ML POUR BTL (IV SOLUTION) ×1 IMPLANT
PACK MINOR (CUSTOM PROCEDURE TRAY) ×1 IMPLANT
PAD ARMBOARD 7.5X6 YLW CONV (MISCELLANEOUS) ×1 IMPLANT
POSITIONER HEAD 8X9X4 ADT (SOFTGOODS) ×1 IMPLANT
SET BASIN LINEN APH (SET/KITS/TRAYS/PACK) ×1 IMPLANT
SPIKE FLUID TRANSFER (MISCELLANEOUS) IMPLANT
SUT ETHILON 3 0 FSL (SUTURE) IMPLANT
SUT VIC AB 3-0 SH 27 (SUTURE) ×1
SUT VIC AB 3-0 SH 27X BRD (SUTURE) ×1 IMPLANT
SYR BULB IRRIG 60ML STRL (SYRINGE) ×1 IMPLANT
SYR CONTROL 10ML LL (SYRINGE) ×1 IMPLANT

## 2023-07-18 NOTE — Interval H&P Note (Signed)
History and Physical Interval Note:  07/18/2023 7:30 AM  [name]  has presented today for surgery, with the diagnosis of LIPOMA, BACK 4 CM.  The various methods of treatment have been discussed with the patient and family. After consideration of risks, benefits and other options for treatment, the patient has consented to  Procedure(s): EXCISION LIPOMA, BACK (N/A) as a surgical intervention.  The patient's history has been reviewed, patient examined, no change in status, stable for surgery.  I have reviewed the patient's chart and labs.  Questions were answered to the patient's satisfaction.     Kaito Schulenburg A Karrine Kluttz

## 2023-07-18 NOTE — Anesthesia Procedure Notes (Signed)
Procedure Name: LMA Insertion Date/Time: 07/18/2023 7:40 AM  Performed by: Shanon Payor, CRNAPre-anesthesia Checklist: Patient identified, Emergency Drugs available, Suction available, Patient being monitored and Timeout performed Patient Re-evaluated:Patient Re-evaluated prior to induction Oxygen Delivery Method: Circle system utilized Preoxygenation: Pre-oxygenation with 100% oxygen Induction Type: IV induction LMA: LMA inserted LMA Size: 4.0 Number of attempts: 1 Placement Confirmation: positive ETCO2, CO2 detector and breath sounds checked- equal and bilateral Tube secured with: Tape Dental Injury: Teeth and Oropharynx as per pre-operative assessment

## 2023-07-18 NOTE — Progress Notes (Signed)
[facility]  Spoke with the patient's husband in the consultation room.  I explained that she tolerated the procedure without difficulty.  She has external stitches in place.  These will be removed in the office at her follow up visit.  I discharged her home with a prescription for narcotic pain medication that they should take as needed for pain.  I also want her taking scheduled Tylenol.  The patient will follow-up with me in 2 weeks.  All questions were answered to his expressed satisfaction.  Theophilus Kinds, DO [facility] [address] [place], [place] [address] [phone] (office)

## 2023-07-18 NOTE — Op Note (Addendum)
[facility] Operative Note  07/18/23  Preoperative Diagnosis: Back lipoma   Postoperative Diagnosis: Same   Procedure(s) Performed: Excision of back lipoma   Surgeon: Theophilus Kinds, DO    Assistants: No qualified resident was available    Anesthesia: LMA   Anesthesiologist: Windell Norfolk, MD    Specimens: Back lipoma   Estimated Blood Loss: Minimal   Blood Replacement: None    Complications: None   Wound Class: Clean   Operative Indications: Patient is a 62 year old female who presents for excision of back lipoma.  She first noted the area 3 years ago, and since that time, it has been increasing and size and causes irritation related to her bra strap.  She is agreeable to surgery at this time.  All risks and benefits of performing this procedure were discussed with the patient including pain, infection, bleeding, damage to the surrounding structures, recurrence of lipoma, and need for more procedures or surgery. The patient voiced understanding of the procedure, all questions were sought and answered, and consent was obtained.  Findings: Intra-muscular back lipoma, 5.5 x 5 cm   Procedure: The patient was taken to the operating room and placed supine. LMA anesthesia was induced. Intravenous antibiotics were not administered.  The patient was then placed in a left lateral decubitus position.  The back was prepared and draped in the usual sterile fashion.  A time-out was completed verifying correct patient, procedure, site, positioning, and implant(s) and/or special equipment prior to beginning this procedure.  A linear incision was made overlying the lipoma. Using electrocautery, the dermis and subcutaneous tissues dissected around the lipoma. The lipoma was noted to be adherent to the underlying muscle with some areas noted to be intra-muscular.  It was carefully dissected off the surrounding muscle.  The lipoma was removed in its entirety and sent to  pathology for evaluation. Hemostasis was achieved using electrocautery. The incision was localized with Marcaine. The dermis was approximated using 3-0 Vicryls suture. The skin was closed using 3-0 Nylon in a vertical mattress fashion. The incision was dressed with Xeroform, 4x4, and Medipore tape.  Final inspection revealed acceptable hemostasis. All counts were correct at the end of the case. The patient was awakened from anesthesia and without complication.  The patient went to the PACU in stable condition.   Theophilus Kinds, DO  [facility] [address] [place], [place] [address] [phone] (office)

## 2023-07-18 NOTE — Discharge Instructions (Signed)
Ambulatory Surgery Discharge Instructions  General Anesthesia or Sedation Do not drive or operate heavy machinery for 24 hours.  Do not consume alcohol, tranquilizers, sleeping medications, or any non-prescribed medications for 24 hours. Do not make important decisions or sign any important papers in the next 24 hours. You should have someone with you tonight at home.  Activity  You are advised to go directly home from the hospital.  Restrict your activities and rest for a day.  Resume light activity tomorrow. No heavy lifting over 10 lbs or strenuous exercise.  Fluids and Diet Begin with clear liquids, bouillon, dry toast, soda crackers.  If not nauseated, you may go to a regular diet when you desire.  Greasy and spicy foods are not advised.  Medications  If you have not had a bowel movement in 24 hours, take 2 tablespoons over the counter Milk of mag.             You May resume your blood thinners tomorrow (Aspirin, coumadin, or other).  You are being discharged with prescriptions for Opioid/Narcotic Medications: There are some specific considerations for these medications that you should know. Opioid Meds have risks & benefits. Addiction to these meds is always a concern with prolonged use Take medication only as directed Do not drive while taking narcotic pain medication Do not crush tablets or capsules Do not use a different container than medication was dispensed in Lock the container of medication in a cool, dry place out of reach of children and pets. Opioid medication can cause addiction Do not share with anyone else (this is a felony) Do not store medications for future use. Dispose of them properly.     Disposal:  Find a [employer] household drug take back site near you.  If you can't get to a drug take back site, use the recipe below as a last resort to dispose of expired, unused or unwanted drugs. Disposal  (Do not dispose chemotherapy drugs this way, talk to your  prescribing doctor instead.) Step 1: Mix drugs (do not crush) with dirt, kitty litter, or used coffee grounds and add a small amount of water to dissolve any solid medications. Step 2: Seal drugs in plastic bag. Step 3: Place plastic bag in trash. Step 4: Take prescription container and scratch out personal information, then recycle or throw away.  Operative Site  You have external stitches at your incision site.  These will be removed in office at your follow up visit. 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 Keep wound clean and dry.  Recommend keeping the area covered to prevent stitches from catching on clothing/bra. No baths or swimming. No lifting more than 10 pounds.  Contact Information: If you have questions or concerns, please call our office, [phone], Monday- Thursday 8AM-5PM and Friday 8AM-12Noon.  If it is after hours or on the weekend, please call [facility] Main [facility], [phone], and ask to speak to the surgeon on call for Dr. Robyne Peers at [facility].   SPECIFIC COMPLICATIONS TO WATCH FOR: Inability to urinate Fever over 101? F by mouth Nausea and vomiting lasting longer than 24 hours. Pain not relieved by medication ordered Swelling around the operative site Increased redness, warmth, hardness, around operative area Numbness, tingling, or cold fingers or toes Blood -soaked dressing, (small amounts of oozing may be normal) Increasing and progressive drainage from surgical area or exam site

## 2023-07-18 NOTE — Anesthesia Preprocedure Evaluation (Signed)
Anesthesia Evaluation  Patient identified by MRN, date of birth, ID band Patient awake    Reviewed: Allergy & Precautions, H&P , NPO status , Patient's Chart, lab work & pertinent test results, reviewed documented beta blocker date and time   History of Anesthesia Complications (+) history of anesthetic complications  Airway Mallampati: II  TM Distance: >3 FB Neck ROM: full    Dental no notable dental hx.    Pulmonary neg pulmonary ROS, asthma , COPD, former smoker   Pulmonary exam normal breath sounds clear to auscultation       Cardiovascular Exercise Tolerance: Good hypertension, negative cardio ROS  Rhythm:regular Rate:Normal     Neuro/Psych  PSYCHIATRIC DISORDERS Anxiety Depression     Neuromuscular disease CVA negative neurological ROS  negative psych ROS   GI/Hepatic negative GI ROS, Neg liver ROS, PUD,GERD  ,,  Endo/Other  negative endocrine ROS    Renal/GU negative Renal ROS  negative genitourinary   Musculoskeletal   Abdominal   Peds  Hematology negative hematology ROS (+)   Anesthesia Other Findings   Reproductive/Obstetrics negative OB ROS                             Anesthesia Physical Anesthesia Plan  ASA: 3  Anesthesia Plan: General   Post-op Pain Management:    Induction:   PONV Risk Score and Plan: Ondansetron  Airway Management Planned:   Additional Equipment:   Intra-op Plan:   Post-operative Plan:   Informed Consent: I have reviewed the patients History and Physical, chart, labs and discussed the procedure including the risks, benefits and alternatives for the proposed anesthesia with the patient or authorized representative who has indicated his/her understanding and acceptance.     Dental Advisory Given  Plan Discussed with: CRNA  Anesthesia Plan Comments:        Anesthesia Quick Evaluation

## 2023-07-18 NOTE — Transfer of Care (Signed)
Immediate Anesthesia Transfer of Care Note  Patient: [name]  Procedure(s) Performed: EXCISION LIPOMA, BACK (Back)  Patient Location: PACU  Anesthesia Type:General  Level of Consciousness: awake, alert , oriented, and patient cooperative  Airway & Oxygen Therapy: Patient Spontanous Breathing and Patient connected to face mask oxygen  Post-op Assessment: Report given to RN and Post -op Vital signs reviewed and stable  Post vital signs: Reviewed and stable  Last Vitals:  Vitals Value Taken Time  BP 139/88 07/18/23 0916  Temp 36.3 C 07/18/23 0916  Pulse 70 07/18/23 0923  Resp 20 07/18/23 0923  SpO2 96 % 07/18/23 0923  Vitals shown include unfiled device data.  Last Pain:  Vitals:   07/18/23 0916  TempSrc: Oral  PainSc: 0-No pain         Complications: No notable events documented.

## 2023-07-19 LAB — SURGICAL PATHOLOGY

## 2023-07-19 NOTE — Anesthesia Postprocedure Evaluation (Signed)
Anesthesia Post Note  Patient: [name]  Procedure(s) Performed: EXCISION LIPOMA, BACK (Back)  Patient location during evaluation: Phase II Anesthesia Type: General Level of consciousness: awake Pain management: pain level controlled Vital Signs Assessment: post-procedure vital signs reviewed and stable Respiratory status: spontaneous breathing and respiratory function stable Cardiovascular status: blood pressure returned to baseline and stable Postop Assessment: no headache and no apparent nausea or vomiting Anesthetic complications: no Comments: Late entry   No notable events documented.   Last Vitals:  Vitals:   07/18/23 0915 07/18/23 0916  BP: 139/88 139/88  Pulse: 66 64  Resp: 16 (!) 21  Temp:  (!) 36.3 C  SpO2: 95% 96%    Last Pain:  Vitals:   07/18/23 0916  TempSrc: Oral  PainSc: 0-No pain                 Windell Norfolk

## 2023-07-20 ENCOUNTER — Encounter (HOSPITAL_COMMUNITY): Payer: Self-pay | Admitting: Surgery

## 2023-07-26 ENCOUNTER — Other Ambulatory Visit: Payer: Self-pay | Admitting: Family Medicine

## 2023-07-26 NOTE — Telephone Encounter (Signed)
Copied from CRM [id number]. Topic: Clinical - Medication Refill >> Jul 26, 2023 10:51 AM Raven B wrote: Most Recent Primary Care Visit:  Provider: Lynnea Ferrier T  Department: [facility]  Visit Type: SAME DAY  Date: 05/11/2023  Medication: ***  Has the patient contacted their pharmacy?  (Agent: If no, request that the patient contact the pharmacy for the refill. If patient does not wish to contact the pharmacy document the reason why and proceed with request.) (Agent: If yes, when and what did the pharmacy advise?)  Is this the correct pharmacy for this prescription?  If no, delete pharmacy and type the correct one.  This is the patient's preferred pharmacy:  [id number] - [place], [place] - [address] [place] [address] Phone: [phone] Fax: [phone]   Has the prescription been filled recently?   Is the patient out of the medication?   Has the patient been seen for an appointment in the last year OR does the patient have an upcoming appointment?   Can we respond through MyChart?   Agent: Please be advised that Rx refills may take up to 3 business days. We ask that you follow-up with your pharmacy.

## 2023-08-01 ENCOUNTER — Ambulatory Visit: Payer: Medicare Other | Admitting: Surgery

## 2023-08-01 ENCOUNTER — Other Ambulatory Visit: Payer: Self-pay

## 2023-08-01 ENCOUNTER — Telehealth: Payer: Self-pay

## 2023-08-01 ENCOUNTER — Encounter: Payer: Self-pay | Admitting: Surgery

## 2023-08-01 VITALS — BP 134/72 | HR 62 | Temp 97.7°F | Resp 14 | Ht 62.0 in | Wt 156.0 lb

## 2023-08-01 DIAGNOSIS — Z09 Encounter for follow-up examination after completed treatment for conditions other than malignant neoplasm: Secondary | ICD-10-CM

## 2023-08-01 DIAGNOSIS — J449 Chronic obstructive pulmonary disease, unspecified: Secondary | ICD-10-CM

## 2023-08-01 MED ORDER — ALBUTEROL SULFATE HFA 108 (90 BASE) MCG/ACT IN AERS: INHALATION_SPRAY | RESPIRATORY_TRACT | 2 refills | Status: DC

## 2023-08-01 NOTE — Telephone Encounter (Signed)
Prescription Request  08/01/2023  LOV: 05/11/23  What is the name of the medication or equipment? albuterol (VENTOLIN HFA) 108 (90 Base) MCG/ACT inhaler [[id number]]   Have you contacted your pharmacy to request a refill? Yes   Which pharmacy would you like this sent to?  [id number] - [place], [place] - [address] [place]  [address] Phone: [phone] Fax: [phone]    Patient notified that their request is being sent to the clinical staff for review and that they should receive a response within 2 business days.   Please advise at [facility] [phone]

## 2023-08-01 NOTE — Progress Notes (Unsigned)
[facility] Note   HPI:  62 y.o. Female presents to clinic for post-op follow-up s/p excision of a back lipoma on 11/5.  She has been doing well since the surgery.  She denies issues at her incision site.  She also denies pain.  She tolerating a diet without nausea and vomiting.  Denies fever and chills.  Review of Systems:  All other review of systems: otherwise negative   Vital Signs:  BP 134/72   Pulse 62   Temp 97.7 F (36.5 C) (Oral)   Resp 14   Ht 5\' 2"  (1.575 m)   Wt 156 lb (70.8 kg)   SpO2 97%   BMI 28.53 kg/m    Physical Exam:  Physical Exam Vitals reviewed.  Constitutional:      Appearance: Normal appearance.  Skin:    General: Skin is warm and dry.     Comments: Back incision is healing well with stitches in place; no erythema, induration, or tenderness  Neurological:     Mental Status: She is alert.     Laboratory studies: None   Imaging:  None  Pathology: A. LIPOMA, BACK, EXCISION:  - Mature adipose consistent with lipoma.    Assessment:  62 y.o. yo Female who presents for follow up s/p excision of back lipoma on 11/5.  Plan:  -Patient has been doing well since the surgery -Removed stiches and placed steri strips.  Advised that the steri strips would come off in the next 3-5 days  -Discussed final pathology -Follow up as needed  All of the above recommendations were discussed with the patient, and all of patient's questions were answered to her expressed satisfaction.  Theophilus Kinds, DO [facility] [address] [place], [place] [address] [phone] (office)

## 2023-08-04 ENCOUNTER — Ambulatory Visit
Admission: RE | Admit: 2023-08-04 | Discharge: 2023-08-04 | Disposition: A | Discharge status: Home / Self Care | Payer: Medicare Other | Source: Ambulatory Visit | Attending: Family Medicine | Admitting: Family Medicine

## 2023-08-04 DIAGNOSIS — Z0000 Encounter for general adult medical examination without abnormal findings: Secondary | ICD-10-CM

## 2023-08-04 DIAGNOSIS — Z1231 Encounter for screening mammogram for malignant neoplasm of breast: Secondary | ICD-10-CM | POA: Diagnosis not present

## 2023-08-25 ENCOUNTER — Other Ambulatory Visit: Payer: Self-pay | Admitting: Family Medicine

## 2023-08-29 ENCOUNTER — Ambulatory Visit: Payer: Self-pay | Admitting: Family Medicine

## 2023-08-29 NOTE — Telephone Encounter (Signed)
Copied from CRM [id number]. Topic: Clinical - Red Word Triage >> Aug 29, 2023  2:26 PM Fuller Mandril wrote: Red Word that prompted transfer to Nurse Triage: Raised BP 190/110 when she checked this morning. Bp medicine makes her sick when she takes it. Feels like she can't do anything.    Chief Complaint: Hypertension Symptoms: Ringing in ears Frequency: unknown Pertinent Negatives: Patient denies trouble breathing or neuro deficits Disposition: [] ED /[] Urgent Care (no appt availability in office) / [x] Appointment(In office/virtual)/ []  [facility]/ [] Home Care/ [] Refused Recommended Disposition /[] [facility]/ []  Follow-up with PCP Additional Notes: Patient reports high blood pressure reading this morning at pharmacy. Sts her BP was 190/100. Pt sts that she stopped taking her BP and cholesterol medication over a week ago because of how it made her feel. Sts that she would have body aches and low energy due to the meds. Patient sts that she took a dose this morning afterward and wanted to be seen to get medications adjusted. Appt with PCP scheduled for tomorrow 08/29/24 at 2pm  Reason for Disposition  [1] Systolic BP  >= 180 OR Diastolic >= 110 AND [2] missed most recent dose of blood pressure medication  Systolic BP  >= 180 OR Diastolic >= 110  Answer Assessment - Initial Assessment Questions 1. BLOOD PRESSURE: "What is the blood pressure?" "Did you take at least two measurements 5 minutes apart?"     191/100; did not taike a second time  2. ONSET: "When did you take your blood pressure?"     Around 8am this morning  3. HOW: "How did you take your blood pressure?" (e.g., automatic home BP monitor, visiting nurse)     Automatic cuff at pharmacy  4. HISTORY: "Do you have a history of high blood pressure?"     Has history for HTN  5. MEDICINES: "Are you taking any medicines for blood pressure?" "Have you missed any doses recently?"     Crestor for cholesterol and  Valsartan; stopped taking 1 week ago  6. OTHER SYMPTOMS: "Do you have any symptoms?" (e.g., blurred vision, chest pain, difficulty breathing, headache, weakness)     Ringing in ears; Left arm pain  Protocols used: Blood Pressure - High-A-AH

## 2023-08-30 ENCOUNTER — Ambulatory Visit (INDEPENDENT_AMBULATORY_CARE_PROVIDER_SITE_OTHER): Payer: Medicare Other | Admitting: Family Medicine

## 2023-08-30 ENCOUNTER — Encounter: Payer: Self-pay | Admitting: Family Medicine

## 2023-08-30 VITALS — BP 142/92 | HR 63 | Temp 97.7°F | Ht 62.0 in | Wt 159.0 lb

## 2023-08-30 DIAGNOSIS — I10 Essential (primary) hypertension: Secondary | ICD-10-CM | POA: Diagnosis not present

## 2023-08-30 DIAGNOSIS — R5383 Other fatigue: Secondary | ICD-10-CM | POA: Diagnosis not present

## 2023-08-30 MED ORDER — HYDROCHLOROTHIAZIDE 12.5 MG PO TABS: 12.5000 mg | ORAL_TABLET | Freq: Every day | ORAL | 1 refills | Status: DC

## 2023-08-30 NOTE — Assessment & Plan Note (Signed)
Uncontrolled. 142/92 today and she is occasionally taking Valsartan. Will discontinue this today due to complaints of tinnitus and fatigue and start Hydrochlorothiazide 12.5mg  daily. Also discontinuing Rosuvastatin as she believes she feels better on days she does not take this, will defer starting a different anti lipidemic until her BP is controlled and she is able to tolerate an antihypertensive. Will obtain CMP as well as CBC and TSH to further evaluate cause of her chronic fatigue. Encouraged to monitor BP outside of office when she is able and seek immediate medical care for chest pain, palpitations, dyspnea with exertion, severe headaches. Follow up with PCP in 2 weeks.

## 2023-08-30 NOTE — Progress Notes (Signed)
Subjective:  HPI: [name] is a 62 y.o. female presenting on 08/30/2023 for Follow-up (High Blood Pressure/)   HPI Patient is in today for elevated blood pressure since yesterday. She checked it when she was at the pharmacy and it was 190/100. She stopped taking her BP and cholesterol medications over 1 week ago due to side effects including tinnitis, fatigue, and low energy. She was taking Valsartan 80mg  daily for 1 year and Rosuvastatin 20mg  daily for at least 6 months and has been changing how she takes these over past few months with hopes she could mitigate side effects. She did report feeling "bad" during her office visit when she was started on Valsartan in 06/2022. She did not follow up for this or her presumed medication side effects so no workup has been performed.  HYPERTENSION without Chronic Kidney Disease Hypertension status: uncontrolled  Satisfied with current treatment? no Duration of hypertension: chronic BP monitoring frequency:  rarely BP range: 190/110 BP medication side effects:  yes Medication compliance: poor compliance Previous BP meds:valsartan Aspirin: no Recurrent headaches: yes Visual changes: yes Palpitations:  with the meds Dyspnea: no Chest pain: no Lower extremity edema: no Dizzy/lightheaded: no    Review of Systems  All other systems reviewed and are negative.   Relevant past medical history reviewed and updated as indicated.   Past Medical History:  Diagnosis Date   Anxiety    Aortic atherosclerosis (HCC)    Arthritis    Asthma    Bed bug bite    Blood transfusion without reported diagnosis    CA - skin cancer    Chronic pain due to injury RIGHT HAND/WRIST IN 2007   Complication of anesthesia    unable to urinate after surgery   COPD (chronic obstructive pulmonary disease) (HCC)    Stage 1   Crush injury of hand RIGHT IN 2007   X 6 SURG'S  LAST ONE BEING TOTAL WRIST FUSION W/ BONE GRAFT AND PLATE   Cubital tunnel  syndrome    Left elbow   Depression    ETOH abuse    GERD (gastroesophageal reflux disease)    05/11/17- not recently   Hemorrhoids    History of kidney stones    passed   Hypertension    MRSA (methicillin resistant Staphylococcus aureus) carrier    Neuromuscular disorder (HCC)    some fibromyalgia   Neuropathy    Osteopenia 07/2019   T score -1.9 FRAX 8.2% / 1.2%   PTSD (post-traumatic stress disorder) 2007 RIGHT HAND CRUSH INJURY W/ 2 FINGER AMPUTATION   Stroke (HCC) X2 CVA'S  IN 1990   LOSS OF VISION RIGHT Peripherial bilteral   Substance abuse (HCC)    hx: narcotic use   UC (ulcerative colitis) (HCC)    Weakness of right leg CAUSED BY RETRIEVAL THIGH MUSCLE FOR FLAP RIGHT HAND INJURY-- 2006   RIGHT LEG GIVES OUT OCCASIONALLY     Past Surgical History:  Procedure Laterality Date   AMPUTATION Left 05/13/2017   Procedure: Left small finger revision amputation ;  Surgeon: Dominica Severin, MD;  [facility];  Service: Orthopedics;  Laterality: Left;  60 mins   CARPAL TUNNEL RELEASE Left 12/07/2020   Procedure: LEFT CARPAL TUNNEL RELEASE;  Surgeon: Eldred Manges, MD;  Location: [facility];  Service: Orthopedics;  Laterality: Left;   COLON SURGERY     15 years ago- fistula on colon removed   COSMETIC SURGERY     right leg  and abdomen   DILATATION & CURETTAGE/HYSTEROSCOPY WITH MYOSURE N/A 06/14/2016   Procedure: DILATATION & CURETTAGE/HYSTEROSCOPY WITH MYOSURE;  Surgeon: Dara Lords, MD;  Location: [facility];  Service: Gynecology;  Laterality: N/A;   HAND CARPECTOMY  12/24/2005   RIGHT-- INCLUDING  REMOVAL  WRIST WIRES AND I &D (EXICIOSNAL)  FOR OSTEOMYELITIS   HYSTEROSCOPY WITH D & C  08/18/2011   Procedure: DILATATION AND CURETTAGE (D&C) /HYSTEROSCOPY;  Surgeon: Dara Lords, MD;  Location: [facility];  Service: Gynecology;  Laterality: N/A;   INCISION / DRAINAGE HAND / FINGER  X3  (2006 & 2007)   RIGHT CRUSH WOUND   LESION REMOVAL   08/18/2011   Procedure: EXCISION VAGINAL LESION;  Surgeon: Dara Lords, MD;  Location: [facility];  Service: Gynecology;  Laterality: N/A;   LIPOMA EXCISION N/A 07/18/2023   Procedure: EXCISION LIPOMA, BACK;  Surgeon: Lewie Chamber, DO;  Location: [facility];  Service: General;  Laterality: N/A;   ORIF CARPAL BONE FRACTURE  08/31/2005   I & D OF RIGHT HAND CRUSH INJURY/ OPEN FX'S AND INDEX FINGER RAY RESECTION/ COMPLICATED CLOSURE   ORIF RADIAL FRACTURE  09/04/2005   DISTAL RADIAL PLATE AND PARTIAL THUMB AMPUTATION   RIGHT TOTAL WRIST FUSION W/ BONE GRAFT AND PLATE  40/98/1191   AND FLAP USING RIGHT GOIN MUSCLE   skin cancer removal  2007   throat area   TUBAL LIGATION  15 YRS AGO    Allergies and medications reviewed and updated.   Current Outpatient Medications:    ADVAIR HFA 230-21 MCG/ACT inhaler, INHALE 2 PUFFS INTO THE LUNGS 2 (TWO) TIMES DAILY., Disp: 12 each, Rfl: 0   albuterol (VENTOLIN HFA) 108 (90 Base) MCG/ACT inhaler, TAKE 2 PUFFS BY MOUTH EVERY 6 HOURS AS NEEDED FOR WHEEZE OR SHORTNESS OF BREATH, Disp: 8.5 each, Rfl: 2   EPINEPHrine 0.3 mg/0.3 mL IJ SOAJ injection, Inject into the muscle., Disp: , Rfl:    estradiol (ESTRACE) 0.1 MG/GM vaginal cream, Place 1 Applicatorful vaginally every other day. Apply a pea-sized (1/2 inch) amount every other day, Disp: , Rfl:    famotidine (PEPCID) 20 MG tablet, TAKE 1 TABLET BY MOUTH EVERYDAY AT BEDTIME, Disp: 90 tablet, Rfl: 1   hydrochlorothiazide (HYDRODIURIL) 12.5 MG tablet, Take 1 tablet (12.5 mg total) by mouth daily., Disp: 30 tablet, Rfl: 1   ibuprofen (ADVIL) 200 MG tablet, Take 400 mg by mouth every 8 (eight) hours as needed for headache., Disp: , Rfl:    linaclotide (LINZESS) 145 MCG CAPS capsule, TAKE 1 CAPSULE BY MOUTH DAILY BEFORE BREAKFAST., Disp: 90 capsule, Rfl: 2   pantoprazole (PROTONIX) 40 MG tablet, TAKE 1 TABLET BY MOUTH EVERY DAY, Disp: 90 tablet, Rfl: 0   Spacer/Aero-Holding Chambers  (AEROCHAMBER MV) inhaler, Use as instructed, Disp: 1 each, Rfl: 0   oxyCODONE (ROXICODONE) 5 MG immediate release tablet, Take 1 tablet (5 mg total) by mouth every 6 (six) hours as needed. (Patient not taking: Reported on 08/30/2023), Disp: 4 tablet, Rfl: 0  Allergies  Allergen Reactions   Aspirin Shortness Of Breath    ASTHMATIC ATTACK   Cashew Nut Oil Anaphylaxis    Cashew Allergy   Breztri Aerosphere [Budeson-Glycopyrrol-Formoterol] Other (See Comments)    Urinary retention   Codeine Other (See Comments)    ABNORMAL BEHAVIOR  Increased in bad moods  NO CODEINE BASED MEDICATIONS    Vicodin [Hydrocodone-Acetaminophen] Other (See Comments)    Extreme anxiety    Objective:  BP (!) 142/92   Pulse 63   Temp 97.7 F (36.5 C) (Oral)   Ht 5\' 2"  (1.575 m)   Wt 159 lb (72.1 kg)   SpO2 96%   BMI 29.08 kg/m      08/30/2023    2:06 PM 08/30/2023    2:00 PM 08/01/2023    2:13 PM  Vitals with BMI  Height  5\' 2"  5\' 2"   Weight  159 lbs 156 lbs  BMI  29.07 28.53  Systolic 142 140 914  Diastolic 92 92 72  Pulse  63 62     Physical Exam Vitals and nursing note reviewed.  Constitutional:      Appearance: Normal appearance. She is normal weight.  HENT:     Head: Normocephalic and atraumatic.  Cardiovascular:     Rate and Rhythm: Normal rate and regular rhythm.     Pulses: Normal pulses.     Heart sounds: Normal heart sounds.  Pulmonary:     Effort: Pulmonary effort is normal.     Breath sounds: Normal breath sounds.  Skin:    General: Skin is warm and dry.  Neurological:     General: No focal deficit present.     Mental Status: She is alert and oriented to person, place, and time. Mental status is at baseline.  Psychiatric:        Mood and Affect: Mood normal.        Behavior: Behavior normal.        Thought Content: Thought content normal.        Judgment: Judgment normal.     Assessment & Plan:  Benign essential HTN Assessment & Plan: Uncontrolled. 142/92  today and she is occasionally taking Valsartan. Will discontinue this today due to complaints of tinnitus and fatigue and start Hydrochlorothiazide 12.5mg  daily. Also discontinuing Rosuvastatin as she believes she feels better on days she does not take this, will defer starting a different anti lipidemic until her BP is controlled and she is able to tolerate an antihypertensive. Will obtain CMP as well as CBC and TSH to further evaluate cause of her chronic fatigue. Encouraged to monitor BP outside of office when she is able and seek immediate medical care for chest pain, palpitations, dyspnea with exertion, severe headaches. Follow up with PCP in 2 weeks.  Orders: -     CBC with Differential/Platelet -     COMPLETE METABOLIC PANEL WITH GFR -     TSH  Fatigue, unspecified type -     TSH  Other orders -     hydroCHLOROthiazide; Take 1 tablet (12.5 mg total) by mouth daily.  Dispense: 30 tablet; Refill: 1     Follow up plan: Return in about 2 weeks (around 09/13/2023) for hypertension with PCP.  Park Meo, FNP

## 2023-08-31 LAB — CBC WITH DIFFERENTIAL/PLATELET
Absolute Lymphocytes: 4178 {cells}/uL — ABNORMAL HIGH (ref 850–3900)
Absolute Monocytes: 1000 {cells}/uL — ABNORMAL HIGH (ref 200–950)
Basophils Absolute: 79 {cells}/uL (ref 0–200)
Basophils Relative: 0.8 %
Eosinophils Absolute: 386 {cells}/uL (ref 15–500)
Eosinophils Relative: 3.9 %
HCT: 39.3 % (ref 35.0–45.0)
Hemoglobin: 12.9 g/dL (ref 11.7–15.5)
MCH: 30.6 pg (ref 27.0–33.0)
MCHC: 32.8 g/dL (ref 32.0–36.0)
MCV: 93.3 fL (ref 80.0–100.0)
MPV: 10.1 fL (ref 7.5–12.5)
Monocytes Relative: 10.1 %
Neutro Abs: 4257 {cells}/uL (ref 1500–7800)
Neutrophils Relative %: 43 %
Platelets: 315 10*3/uL (ref 140–400)
RBC: 4.21 10*6/uL (ref 3.80–5.10)
RDW: 12 % (ref 11.0–15.0)
Total Lymphocyte: 42.2 %
WBC: 9.9 10*3/uL (ref 3.8–10.8)

## 2023-08-31 LAB — COMPLETE METABOLIC PANEL WITH GFR
AG Ratio: 2 (calc) (ref 1.0–2.5)
ALT: 20 U/L (ref 6–29)
AST: 20 U/L (ref 10–35)
Albumin: 4.5 g/dL (ref 3.6–5.1)
Alkaline phosphatase (APISO): 65 U/L (ref 37–153)
BUN: 14 mg/dL (ref 7–25)
CO2: 28 mmol/L (ref 20–32)
Calcium: 10.3 mg/dL (ref 8.6–10.4)
Chloride: 105 mmol/L (ref 98–110)
Creat: 0.8 mg/dL (ref 0.50–1.05)
Globulin: 2.3 g/dL (ref 1.9–3.7)
Glucose, Bld: 94 mg/dL (ref 65–99)
Potassium: 4.3 mmol/L (ref 3.5–5.3)
Sodium: 141 mmol/L (ref 135–146)
Total Bilirubin: 0.4 mg/dL (ref 0.2–1.2)
Total Protein: 6.8 g/dL (ref 6.1–8.1)
eGFR: 82 mL/min/{1.73_m2} (ref >=60)

## 2023-08-31 LAB — TSH: TSH: 1.89 m[IU]/L (ref 0.40–4.50)

## 2023-09-12 ENCOUNTER — Telehealth: Payer: Self-pay | Admitting: Family Medicine

## 2023-09-12 NOTE — Telephone Encounter (Signed)
 Prescription Request  09/12/2023  LOV: 05/11/2023  What is the name of the medication or equipment? Valsartan  80mg  tablet  Have you contacted your pharmacy to request a refill? Yes   Which pharmacy would you like this sent to?  [id number] - [place], [place] - [address] [place] [address] Phone: [phone] Fax: [phone]    Patient notified that their request is being sent to the clinical staff for review and that they should receive a response within 2 business days.   Please advise at [facility] [phone]

## 2023-09-14 ENCOUNTER — Encounter: Payer: Self-pay | Admitting: Family Medicine

## 2023-09-14 ENCOUNTER — Ambulatory Visit: Payer: Medicare Other | Admitting: Family Medicine

## 2023-09-14 VITALS — BP 134/82 | HR 74 | Temp 98.0°F | Ht 62.0 in | Wt 157.0 lb

## 2023-09-14 DIAGNOSIS — R1011 Right upper quadrant pain: Secondary | ICD-10-CM | POA: Diagnosis not present

## 2023-09-14 DIAGNOSIS — R062 Wheezing: Secondary | ICD-10-CM

## 2023-09-14 MED ORDER — HYDROCODONE BIT-HOMATROP MBR 5-1.5 MG/5ML PO SOLN: 5.0000 mL | Freq: Three times a day (TID) | ORAL | 0 refills | Status: DC | PRN

## 2023-09-14 MED ORDER — PREDNISONE 20 MG PO TABS: ORAL_TABLET | ORAL | 0 refills | Status: DC

## 2023-09-14 NOTE — Progress Notes (Signed)
 Subjective:    Patient ID: [name], female    DOB: 02-May-1961, 63 y.o.   MRN: [id number] Patient presents today with a cough x 1 week.  She denies any fevers or chills.  She is wheezing on exam.  She does report wheezing worse at night.  She also reports a cough keeping her awake.  She also has head congestion.  She denies any chest pain.  She does report a sore scratchy throat.  Symptoms seem to suggest a viral upper respiratory infection.  She denies any purulent sputum.  She does complain of intermittent right upper quadrant pain for the last few weeks.  She states that after she eats she will feel pain underneath her right breast that radiates into her right.  It only occurs with the eating.  She denies any blood in her stool or melena.  At her last visit, my partner switched her valsartan  to hydrochlorothiazide  Necko statin.  The side effects have gone away and her blood pressure remains well-controlled.  Past Medical History:  Diagnosis Date   Anxiety    Aortic atherosclerosis (HCC)    Arthritis    Asthma    Bed bug bite    Blood transfusion without reported diagnosis    CA - skin cancer    Chronic pain due to injury RIGHT HAND/WRIST IN 2007   Complication of anesthesia    unable to urinate after surgery   COPD (chronic obstructive pulmonary disease) (HCC)    Stage 1   Crush injury of hand RIGHT IN 2007   X 6 SURG'S  LAST ONE BEING TOTAL WRIST FUSION W/ BONE GRAFT AND PLATE   Cubital tunnel syndrome    Left elbow   Depression    ETOH abuse    GERD (gastroesophageal reflux disease)    05/11/17- not recently   Hemorrhoids    History of kidney stones    passed   Hypertension    MRSA (methicillin resistant Staphylococcus aureus) carrier    Neuromuscular disorder (HCC)    some fibromyalgia   Neuropathy    Osteopenia 07/2019   T score -1.9 FRAX 8.2% / 1.2%   PTSD (post-traumatic stress disorder) 2007 RIGHT HAND CRUSH INJURY W/ 2 FINGER AMPUTATION   Stroke (HCC) X2  CVA'S  IN 1990   LOSS OF VISION RIGHT Peripherial bilteral   Substance abuse (HCC)    hx: narcotic use   UC (ulcerative colitis) (HCC)    Weakness of right leg CAUSED BY RETRIEVAL THIGH MUSCLE FOR FLAP RIGHT HAND INJURY-- 2006   RIGHT LEG GIVES OUT OCCASIONALLY   Past Surgical History:  Procedure Laterality Date   AMPUTATION Left 05/13/2017   Procedure: Left small finger revision amputation ;  Surgeon: Camella Fallow, MD;  Location: [facility];  Service: Orthopedics;  Laterality: Left;  60 mins   CARPAL TUNNEL RELEASE Left 12/07/2020   Procedure: LEFT CARPAL TUNNEL RELEASE;  Surgeon: Barbarann Oneil BROCKS, MD;  Location: [facility];  Service: Orthopedics;  Laterality: Left;   COLON SURGERY     15 years ago- fistula on colon removed   COSMETIC SURGERY     right leg and abdomen   DILATATION & CURETTAGE/HYSTEROSCOPY WITH MYOSURE N/A 06/14/2016   Procedure: DILATATION & CURETTAGE/HYSTEROSCOPY WITH MYOSURE;  Surgeon: Evalene SHAUNNA Organ, MD;  Location: [facility];  Service: Gynecology;  Laterality: N/A;   HAND CARPECTOMY  12/24/2005   RIGHT-- INCLUDING  REMOVAL  WRIST WIRES AND I &D (EXICIOSNAL)  FOR OSTEOMYELITIS  HYSTEROSCOPY WITH D & C  08/18/2011   Procedure: DILATATION AND CURETTAGE (D&C) /HYSTEROSCOPY;  Surgeon: Evalene SHAUNNA Organ, MD;  Location: [facility];  Service: Gynecology;  Laterality: N/A;   INCISION / DRAINAGE HAND / FINGER  X3  (2006 & 2007)   RIGHT CRUSH WOUND   LESION REMOVAL  08/18/2011   Procedure: EXCISION VAGINAL LESION;  Surgeon: Evalene SHAUNNA Organ, MD;  Location: [facility];  Service: Gynecology;  Laterality: N/A;   LIPOMA EXCISION N/A 07/18/2023   Procedure: EXCISION LIPOMA, BACK;  Surgeon: Evonnie Dorothyann LABOR, DO;  Location: [facility];  Service: General;  Laterality: N/A;   ORIF CARPAL BONE FRACTURE  08/31/2005   I & D OF RIGHT HAND CRUSH INJURY/ OPEN FX'S AND INDEX FINGER RAY RESECTION/ COMPLICATED CLOSURE   ORIF RADIAL FRACTURE   09/04/2005   DISTAL RADIAL PLATE AND PARTIAL THUMB AMPUTATION   RIGHT TOTAL WRIST FUSION W/ BONE GRAFT AND PLATE  91/69/7992   AND FLAP USING RIGHT GOIN MUSCLE   skin cancer removal  2007   throat area   TUBAL LIGATION  15 YRS AGO   Current Outpatient Medications on File Prior to Visit  Medication Sig Dispense Refill   ADVAIR  HFA 230-21 MCG/ACT inhaler INHALE 2 PUFFS INTO THE LUNGS 2 (TWO) TIMES DAILY. 12 each 0   albuterol  (VENTOLIN  HFA) 108 (90 Base) MCG/ACT inhaler TAKE 2 PUFFS BY MOUTH EVERY 6 HOURS AS NEEDED FOR WHEEZE OR SHORTNESS OF BREATH 8.5 each 2   EPINEPHrine  0.3 mg/0.3 mL IJ SOAJ injection Inject into the muscle.     estradiol  (ESTRACE ) 0.1 MG/GM vaginal cream Place 1 Applicatorful vaginally every other day. Apply a pea-sized (1/2 inch) amount every other day     famotidine  (PEPCID ) 20 MG tablet TAKE 1 TABLET BY MOUTH EVERYDAY AT BEDTIME 90 tablet 1   hydrochlorothiazide  (HYDRODIURIL ) 12.5 MG tablet Take 1 tablet (12.5 mg total) by mouth daily. 30 tablet 1   ibuprofen (ADVIL) 200 MG tablet Take 400 mg by mouth every 8 (eight) hours as needed for headache.     linaclotide  (LINZESS ) 145 MCG CAPS capsule TAKE 1 CAPSULE BY MOUTH DAILY BEFORE BREAKFAST. 90 capsule 2   oxyCODONE  (ROXICODONE ) 5 MG immediate release tablet Take 1 tablet (5 mg total) by mouth every 6 (six) hours as needed. (Patient not taking: Reported on 08/30/2023) 4 tablet 0   pantoprazole  (PROTONIX ) 40 MG tablet TAKE 1 TABLET BY MOUTH EVERY DAY 90 tablet 0   Spacer/Aero-Holding Chambers (AEROCHAMBER MV) inhaler Use as instructed 1 each 0   No current facility-administered medications on file prior to visit.     Allergies  Allergen Reactions   Aspirin Shortness Of Breath    ASTHMATIC ATTACK   Cashew Nut Oil Anaphylaxis    Cashew Allergy   Breztri Aerosphere [Budeson-Glycopyrrol-Formoterol ] Other (See Comments)    Urinary retention   Codeine  Other (See Comments)    ABNORMAL BEHAVIOR  Increased in bad  moods  NO CODEINE  BASED MEDICATIONS    Vicodin [Hydrocodone -Acetaminophen ] Other (See Comments)    Extreme anxiety   Social History   Socioeconomic History   Marital status: Married    Spouse name: Not on file   Number of children: 6   Years of education: Not on file   Highest education level: Not on file  Occupational History   Occupation: Housekeeper   Tobacco Use   Smoking status: Former    Current packs/day: 0.00    Average packs/day: 1.5 packs/day  for 28.0 years (42.0 ttl pk-yrs)    Types: Cigarettes    Start date: 11/29/1973    Quit date: 11/29/2001    Years since quitting: 21.8   Smokeless tobacco: Never  Vaping Use   Vaping status: Never Used  Substance and Sexual Activity   Alcohol use: Yes    Comment: wine daily   Drug use: Yes    Types: Marijuana    Comment: last smoked 12-08-20   Sexual activity: Yes    Partners: Male    Birth control/protection: Post-menopausal    Comment: 1st intercourse 63 yo-More than 5 partners  Other Topics Concern   Not on file  Social History Narrative   Lives with husband.     Social Drivers of Corporate Investment Banker Strain: Low Risk  (11/10/2022)   Overall Financial Resource Strain (CARDIA)    Difficulty of Paying Living Expenses: Not very hard  Food Insecurity: No Food Insecurity (11/10/2022)   Hunger Vital Sign    Worried About Running Out of Food in the Last Year: Never true    Ran Out of Food in the Last Year: Never true  Transportation Needs: No Transportation Needs (11/10/2022)   PRAPARE - Administrator, Civil Service (Medical): No    Lack of Transportation (Non-Medical): No  Physical Activity: Insufficiently Active (11/10/2022)   Exercise Vital Sign    Days of Exercise per Week: 3 days    Minutes of Exercise per Session: 30 min  Stress: No Stress Concern Present (11/10/2022)   [employer] of Occupational Health - Occupational Stress Questionnaire    Feeling of Stress : Not at all  Social  Connections: Moderately Isolated (11/10/2022)   Social Connection and Isolation Panel [NHANES]    Frequency of Communication with Friends and Family: More than three times a week    Frequency of Social Gatherings with Friends and Family: Once a week    Attends Religious Services: Never    Database Administrator or Organizations: No    Attends Banker Meetings: Never    Marital Status: Married  Catering Manager Violence: Not At Risk (11/10/2022)   Humiliation, Afraid, Rape, and Kick questionnaire    Fear of Current or Ex-Partner: No    Emotionally Abused: No    Physically Abused: No    Sexually Abused: No     Review of Systems  Gastrointestinal:  Positive for abdominal pain.  All other systems reviewed and are negative.      Objective:   Physical Exam Vitals reviewed.  Constitutional:      General: She is not in acute distress.    Appearance: Normal appearance. She is normal weight. She is not ill-appearing, toxic-appearing or diaphoretic.  HENT:     Right Ear: Tympanic membrane and ear canal normal.     Left Ear: Tympanic membrane and ear canal normal.     Nose: Congestion and rhinorrhea present.  Eyes:     General: Lids are everted, no foreign bodies appreciated. Vision grossly intact.        Right eye: No discharge.        Left eye: No discharge.     Extraocular Movements: Extraocular movements intact.     Conjunctiva/sclera: Conjunctivae normal.     Right eye: Right conjunctiva is not injected. No chemosis, exudate or hemorrhage.    Left eye: Left conjunctiva is not injected. No chemosis, exudate or hemorrhage.    Pupils: Pupils are equal, round, and reactive  to light.  Cardiovascular:     Rate and Rhythm: Normal rate and regular rhythm.     Pulses: Normal pulses.     Heart sounds: Normal heart sounds. No murmur heard.    No friction rub. No gallop.  Pulmonary:     Effort: Pulmonary effort is normal. No respiratory distress.     Breath sounds: No stridor  or decreased air movement. Wheezing present. No rhonchi or rales.  Abdominal:     General: Bowel sounds are normal. There is no distension.     Palpations: Abdomen is soft. There is no mass.     Tenderness: There is no abdominal tenderness. There is no guarding or rebound.     Hernia: No hernia is present.  Musculoskeletal:     Cervical back: No spinous process tenderness or muscular tenderness.  Neurological:     Mental Status: She is alert.           Assessment & Plan:  RUQ pain - Plan: US  Abdomen Limited RUQ (LIVER/GB)  Wheezing I believe the patient has a viral upper respiratory infection.  However this is triggering bronchospasm and wheezing.  Begin with a prednisone  taper pack.  Patient can use Hycodan 1 teaspoon every 8 hours as needed for cough.  Meanwhile I will schedule the patient for an ultrasound of her liver to evaluate for gallstones.  I believe the right upper quadrant pain is biliary colic.  I recommended rechecking fasting lab work in May.  She is to work on diet and exercise try to manage her cholesterol.  Continue to hold her rosuvastatin  and to take hydrochlorothiazide  for hypertension.

## 2023-09-19 ENCOUNTER — Ambulatory Visit
Admission: RE | Admit: 2023-09-19 | Discharge: 2023-09-19 | Disposition: A | Discharge status: Home / Self Care | Payer: Medicare Other | Source: Ambulatory Visit | Attending: Family Medicine | Admitting: Family Medicine

## 2023-09-19 DIAGNOSIS — R1011 Right upper quadrant pain: Secondary | ICD-10-CM

## 2023-09-21 ENCOUNTER — Other Ambulatory Visit: Payer: Self-pay

## 2023-09-21 ENCOUNTER — Telehealth: Payer: Self-pay

## 2023-09-21 ENCOUNTER — Other Ambulatory Visit: Payer: Self-pay | Admitting: Family Medicine

## 2023-09-21 MED ORDER — PANTOPRAZOLE SODIUM 40 MG PO TBEC: 40.0000 mg | DELAYED_RELEASE_TABLET | Freq: Every day | ORAL | 0 refills | Status: DC

## 2023-09-21 NOTE — Telephone Encounter (Signed)
 Prescription Request  09/21/2023  LOV: 09/14/23  What is the name of the medication or equipment? pantoprazole  (PROTONIX ) 40 MG tablet [[id number]]   Have you contacted your pharmacy to request a refill? Yes   Which pharmacy would you like this sent to?  [id number] [place], [place] - [address] [place] [address] Phone: [phone] Fax: [phone]    Patient notified that their request is being sent to the clinical staff for review and that they should receive a response within 2 business days.   Please advise at [facility] [phone]

## 2023-09-27 ENCOUNTER — Telehealth: Payer: Self-pay

## 2023-09-27 NOTE — Telephone Encounter (Signed)
 Copied from CRM #[id number]. Topic: Clinical - Medical Advice >> Sep 27, 2023 11:46 AM Arlie Benedict B wrote: Reason for CRM: Patient called in requesting call back regarding her ultrasound. I read the message to her and the patient stated she is still having the pain in that area and needed to know what to do. Patient contact number is [phone]

## 2023-10-14 ENCOUNTER — Other Ambulatory Visit: Payer: Self-pay | Admitting: Family Medicine

## 2023-10-14 DIAGNOSIS — J449 Chronic obstructive pulmonary disease, unspecified: Secondary | ICD-10-CM

## 2023-10-16 NOTE — Telephone Encounter (Signed)
Requested medications are due for refill today.  yes  Requested medications are on the active medications list.  yes  Last refill. 08/01/2023 8.5 2 rf  Future visit scheduled.   no  Notes to clinic.  Pt due for a CPE.    Requested Prescriptions  Pending Prescriptions Disp Refills   albuterol (VENTOLIN HFA) 108 (90 Base) MCG/ACT inhaler [Pharmacy Med Name: ALBUTEROL HFA (PROAIR) INHALER] 8.5 each 2    Sig: TAKE 2 PUFFS BY MOUTH EVERY 6 HOURS AS NEEDED FOR WHEEZE OR SHORTNESS OF BREATH     Pulmonology:  Beta Agonists 2 Failed - 10/16/2023  9:26 AM      Failed - Valid encounter within last 12 months    Recent Outpatient Visits           1 year ago Colon cancer screening   [facility] Pickard, Priscille Heidelberg, MD   1 year ago Diverticulitis   [facility] Donita Brooks, MD   2 years ago Dyspnea, unspecified type   [facility] Donita Brooks, MD   2 years ago Chest pain, unspecified type   [facility] Donita Brooks, MD   2 years ago Generalized abdominal pain   [facility] Pickard, Priscille Heidelberg, MD              Passed - Last BP in normal range    BP Readings from Last 1 Encounters:  09/14/23 134/82         Passed - Last Heart Rate in normal range    Pulse Readings from Last 1 Encounters:  09/14/23 74

## 2023-10-19 ENCOUNTER — Other Ambulatory Visit: Payer: Self-pay | Admitting: Family Medicine

## 2023-10-19 DIAGNOSIS — J449 Chronic obstructive pulmonary disease, unspecified: Secondary | ICD-10-CM

## 2023-10-19 MED ORDER — ALBUTEROL SULFATE HFA 108 (90 BASE) MCG/ACT IN AERS: INHALATION_SPRAY | RESPIRATORY_TRACT | 0 refills | Status: DC

## 2023-10-19 NOTE — Telephone Encounter (Signed)
 Copied from CRM [id number]. Topic: Clinical - Medication Refill >> Oct 19, 2023 11:28 AM Wyona SQUIBB wrote: Most Recent Primary Care Visit:  Provider: DUANNE LOWERS T  Department: [facility]  Visit Type: OFFICE VISIT  Date: 09/14/2023  Medication: albuterol  (VENTOLIN  HFA) 108 (90 Base) MCG/ACT inhaler    Has the patient contacted their pharmacy? No (Agent: If no, request that the patient contact the pharmacy for the refill. If patient does not wish to contact the pharmacy document the reason why and proceed with request.) (Agent: If yes, when and what did the pharmacy advise?)  Is this the correct pharmacy for this prescription? Yes If no, delete pharmacy and type the correct one.  This is the patient's preferred pharmacy:  [id number] [place], [place] - [address] [place] [address] Phone: [phone] Fax: [phone]   Has the prescription been filled recently? No  Is the patient out of the medication? Yes, half of one inhaler left. Good for a few days.   Has the patient been seen for an appointment in the last year OR does the patient have an upcoming appointment? Yes  Can we respond through MyChart? No  Agent: Please be advised that Rx refills may take up to 3 business days. We ask that you follow-up with your pharmacy.

## 2023-10-19 NOTE — Telephone Encounter (Signed)
 Copied from CRM [id number]. Topic: Clinical - Prescription Issue >> Oct 19, 2023 11:26 AM Ivette P wrote: Reason for CRM: Pt is having issues with taking ADVAIR  HFA 230-21 MCG/ACT inhaler, pt has been taking albuterol  (VENTOLIN  HFA) 108 (90 Base) MCG/ACT inhaler. Pt would like a refill on albuterol  (VENTOLIN  HFA) 108 (90 Base) MCG/ACT inhaler    Pt callback [phone]

## 2023-11-16 ENCOUNTER — Other Ambulatory Visit: Payer: Self-pay | Admitting: Family Medicine

## 2023-11-16 DIAGNOSIS — J449 Chronic obstructive pulmonary disease, unspecified: Secondary | ICD-10-CM

## 2023-11-21 ENCOUNTER — Telehealth: Payer: Self-pay

## 2023-11-21 NOTE — Telephone Encounter (Signed)
 Copied from CRM [id number]. Topic: General - Other >> Nov 21, 2023  2:56 PM Abundio Miu S wrote: Reason for CRM: Victorino Dike with [facility] requesting Prior Authorization for patient for Advair HSA.  Callback # [phone]

## 2023-11-23 NOTE — Telephone Encounter (Signed)
 [name] (Key: [id number])  Your information has been sent to [employer].

## 2023-11-27 ENCOUNTER — Other Ambulatory Visit: Payer: Self-pay | Admitting: Family Medicine

## 2023-11-28 NOTE — Telephone Encounter (Signed)
 Requested Prescriptions  Pending Prescriptions Disp Refills   linaclotide (LINZESS) 145 MCG CAPS capsule [Pharmacy Med Name: LINZESS 145 MCG CAPSULE] 90 capsule 0    Sig: TAKE 1 CAPSULE BY MOUTH EVERY DAY BEFORE BREAKFAST     Gastroenterology: Irritable Bowel Syndrome Failed - 11/28/2023  1:58 PM      Failed - Valid encounter within last 12 months    Recent Outpatient Visits           1 year ago Colon cancer screening   [facility] Donita Brooks, MD   2 years ago Diverticulitis   [facility] Donita Brooks, MD   2 years ago Dyspnea, unspecified type   [facility] Donita Brooks, MD   2 years ago Chest pain, unspecified type   [facility] Donita Brooks, MD   3 years ago Generalized abdominal pain   [facility] Pickard, Priscille Heidelberg, MD

## 2023-11-30 ENCOUNTER — Encounter: Payer: Self-pay | Admitting: Family Medicine

## 2023-11-30 ENCOUNTER — Ambulatory Visit (INDEPENDENT_AMBULATORY_CARE_PROVIDER_SITE_OTHER): Admitting: Family Medicine

## 2023-11-30 VITALS — BP 120/78 | HR 81 | Temp 97.9°F | Ht 62.0 in | Wt 156.4 lb

## 2023-11-30 DIAGNOSIS — R1310 Dysphagia, unspecified: Secondary | ICD-10-CM | POA: Diagnosis not present

## 2023-11-30 MED ORDER — PREDNISONE 20 MG PO TABS: ORAL_TABLET | ORAL | 0 refills | Status: DC
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
## 2023-12-05 ENCOUNTER — Encounter: Payer: Self-pay | Admitting: Family Medicine

## 2023-12-05 ENCOUNTER — Ambulatory Visit (INDEPENDENT_AMBULATORY_CARE_PROVIDER_SITE_OTHER): Admitting: Family Medicine

## 2023-12-05 ENCOUNTER — Ambulatory Visit: Payer: Self-pay

## 2023-12-05 VITALS — BP 156/86 | HR 57 | Temp 98.7°F | Ht 62.0 in | Wt 156.0 lb

## 2023-12-05 DIAGNOSIS — R09A2 Foreign body sensation, throat: Secondary | ICD-10-CM | POA: Diagnosis not present

## 2023-12-05 MED ORDER — HYDROXYZINE PAMOATE 25 MG PO CAPS: 25.0000 mg | ORAL_CAPSULE | Freq: Three times a day (TID) | ORAL | 0 refills | Status: DC | PRN
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
## 2023-12-05 NOTE — Telephone Encounter (Signed)
 Copied from CRM [id number]. Topic: Clinical - Red Word Triage >> Dec 05, 2023  8:26 AM [name] wrote: [facility] that prompted transfer to Nurse Triage: throat  swelling  Chief Complaint: throat swelling Symptoms: pain, difficult to swallow; was seen last Thrusday Frequency: constant Pertinent Negatives: Patient denies sob, cp, cold like symptoms Disposition: [] ED /[] Urgent Care (no appt availability in office) / [x] Appointment(In office/virtual)/ []  [place] [facility]/ [] Home Care/ [] Refused Recommended Disposition /[] [place] [facility]/ []  Follow-up with PCP Additional Notes: per protocol instructed to make apt for today; care advice given, denies questions; instructed to go to ER if becomes worse.   Reason for Disposition  SEVERE (e.g., excruciating) throat pain  Answer Assessment - Initial Assessment Questions 1. ONSET: "When did the throat start hurting?" (Hours or days ago)      Swelling in throat for over a week 2. SEVERITY: "How bad is the sore throat?" (Scale 1-10; mild, moderate or severe)   - MILD (1-3):  Doesn't interfere with eating or normal activities.   - MODERATE (4-7): Interferes with eating some solids and normal activities.   - SEVERE (8-10):  Excruciating pain, interferes with most normal activities.   - SEVERE WITH DYSPHAGIA (10): Can't swallow liquids, drooling.     Just difficult to swallow, it's itchy 3. STREP EXPOSURE: "Has there been any exposure to strep within the past week?" If Yes, ask: "What type of contact occurred?"      denies 4.  VIRAL SYMPTOMS: "Are there any symptoms of a cold, such as a runny nose, cough, hoarse voice or red eyes?"      denies 5. FEVER: "Do you have a fever?" If Yes, ask: "What is your temperature, how was it measured, and when did it start?"     denies 6. PUS ON THE TONSILS: "Is there pus on the tonsils in the back of your throat?"     na 7. OTHER SYMPTOMS: "Do you have any other symptoms?" (e.g., difficulty breathing,  headache, rash)     "Feels like a lump in my throat" 8. PREGNANCY: "Is there any chance you are pregnant?" "When was your last menstrual period?"     na  Protocols used: Sore Throat-A-AH

## 2023-12-06 ENCOUNTER — Ambulatory Visit: Admitting: Family Medicine

## 2023-12-15 ENCOUNTER — Encounter: Payer: Self-pay | Admitting: Physician Assistant

## 2023-12-15 ENCOUNTER — Other Ambulatory Visit: Payer: Self-pay | Admitting: Family Medicine

## 2023-12-15 DIAGNOSIS — J449 Chronic obstructive pulmonary disease, unspecified: Secondary | ICD-10-CM

## 2023-12-17 ENCOUNTER — Other Ambulatory Visit: Payer: Self-pay | Admitting: Family Medicine

## 2023-12-18 NOTE — Telephone Encounter (Signed)
 Requested Prescriptions  Pending Prescriptions Disp Refills   pantoprazole (PROTONIX) 40 MG tablet [Pharmacy Med Name: PANTOPRAZOLE SOD DR 40 MG TAB] 90 tablet 0    Sig: TAKE 1 TABLET BY MOUTH EVERY DAY     Gastroenterology: Proton Pump Inhibitors Passed - 12/18/2023  5:07 PM      Passed - Valid encounter within last 12 months    Recent Outpatient Visits           1 week ago Globus sensation   [place] [facility] Donita Brooks, MD   2 weeks ago Dysphagia, unspecified type   [place] [facility] Donita Brooks, MD   3 months ago RUQ pain   [place] [facility] Donita Brooks, MD   3 months ago Benign essential HTN   [place] [facility] [place] Meo, FNP   7 months ago Sebaceous cyst   [place] [facility] Pickard, Priscille Heidelberg, MD

## 2023-12-18 NOTE — Telephone Encounter (Signed)
 Requested medications are due for refill today.  yes  Requested medications are on the active medications list.  yes  Last refill. 10/19/2023 8.5 0 rf  Future visit scheduled.   no  Notes to clinic.  Pt was given a courtesy refill. Pt was seen in office, however has not had CPE as requested. Please review for refill.    Requested Prescriptions  Pending Prescriptions Disp Refills   albuterol (VENTOLIN HFA) 108 (90 Base) MCG/ACT inhaler [Pharmacy Med Name: ALBUTEROL HFA (PROAIR) INHALER] 8.5 each 0    Sig: TAKE 2 PUFFS BY MOUTH EVERY 6 HOURS AS NEEDED FOR WHEEZE OR SHORTNESS OF BREATH     Pulmonology:  Beta Agonists 2 Failed - 12/18/2023  7:59 AM      Failed - Last BP in normal range    BP Readings from Last 1 Encounters:  12/05/23 (!) 156/86         Passed - Last Heart Rate in normal range    Pulse Readings from Last 1 Encounters:  12/05/23 (!) 57         Passed - Valid encounter within last 12 months    Recent Outpatient Visits           1 week ago Globus sensation   [place] [facility] Pickard, Priscille Heidelberg, MD   2 weeks ago Dysphagia, unspecified type   [place] [facility] Donita Brooks, MD   3 months ago RUQ pain   [place] [facility] Donita Brooks, MD   3 months ago Benign essential HTN   [place] [facility] [place] Meo, FNP   7 months ago Sebaceous cyst   [place] Pickard, Priscille Heidelberg, MD

## 2023-12-25 ENCOUNTER — Telehealth: Payer: Self-pay

## 2023-12-25 ENCOUNTER — Other Ambulatory Visit: Payer: Self-pay

## 2023-12-25 DIAGNOSIS — J449 Chronic obstructive pulmonary disease, unspecified: Secondary | ICD-10-CM

## 2023-12-25 MED ORDER — ALBUTEROL SULFATE HFA 108 (90 BASE) MCG/ACT IN AERS: INHALATION_SPRAY | RESPIRATORY_TRACT | 0 refills | Status: DC

## 2023-12-25 NOTE — Telephone Encounter (Signed)
 Copied from CRM [id number]. Topic: Clinical - Medication Question >> Dec 25, 2023  2:48 PM [name] wrote: Wants to know is she can get a refill on her - albuterol (VENTOLIN HFA) 108 (90 Base) MCG/ACT inhaler she will run out before her appointment on January 02, 2024 and says she can't go without it

## 2023-12-29 ENCOUNTER — Ambulatory Visit
Admission: EM | Admit: 2023-12-29 | Discharge: 2023-12-29 | Disposition: A | Discharge status: Home / Self Care | Attending: Emergency Medicine | Admitting: Emergency Medicine

## 2023-12-29 ENCOUNTER — Encounter: Payer: Self-pay | Admitting: Emergency Medicine

## 2023-12-29 DIAGNOSIS — R062 Wheezing: Secondary | ICD-10-CM | POA: Diagnosis not present

## 2023-12-29 DIAGNOSIS — J441 Chronic obstructive pulmonary disease with (acute) exacerbation: Secondary | ICD-10-CM | POA: Diagnosis not present

## 2023-12-29 MED ORDER — ALBUTEROL SULFATE (2.5 MG/3ML) 0.083% IN NEBU
2.5000 mg | INHALATION_SOLUTION | Freq: Once | RESPIRATORY_TRACT | Status: AC
  Administered 2023-12-29: 2.5 mg via RESPIRATORY_TRACT

## 2023-12-29 MED ORDER — BUDESONIDE-FORMOTEROL FUMARATE 80-4.5 MCG/ACT IN AERO: 2.0000 | INHALATION_SPRAY | Freq: Two times a day (BID) | RESPIRATORY_TRACT | 1 refills | Status: DC

## 2023-12-29 NOTE — Discharge Instructions (Signed)
 Use the spacer when you use albuterol .   Use the Symbicort  (budesonide /formoterol ) inhaler instead of the advair  to see if you tolerate it better.   I do recommend you start taking your blood pressure medicine again.

## 2023-12-29 NOTE — ED Provider Notes (Signed)
 [facility]    CSN: [id number] Arrival date & time: 12/29/23  1052      History   Chief Complaint Chief Complaint  Patient presents with   Shortness of Breath   Headache    HPI [name] is a 63 y.o. female. Pt reports intermittent episodes of SOB and headaches x2.18months. Now daily is SOB. Reports the SOB is worse in the mornings when she first wakes up. Pt states her symptoms started after her MD increased her advair  inhaler dose but the increased dose irritated her throat so she stopped taking it. Pt reports she is using her rescue albuterol  inhaler 12-14, up to 20 times/day; is not using a spacer. Albuterol  helps a little temporarily Using tylenol  and benadryl for the headaches. Pt also reports she stopped taking bp meds last week because she doesn't feel good when taking them, PCP is unaware. Reports she has follow ups with PCP next week and ENT in June. Primary concern is wheezing/SOB   Shortness of Breath Associated symptoms: headaches   Headache   Past Medical History:  Diagnosis Date   Anxiety    Aortic atherosclerosis (HCC)    Arthritis    Asthma    Bed bug bite    Blood transfusion without reported diagnosis    CA - skin cancer    Chronic pain due to injury RIGHT HAND/WRIST IN 2007   Complication of anesthesia    unable to urinate after surgery   COPD (chronic obstructive pulmonary disease) (HCC)    Stage 1   Crush injury of hand RIGHT IN 2007   X 6 SURG'S  LAST ONE BEING TOTAL WRIST FUSION W/ BONE GRAFT AND PLATE   Cubital tunnel syndrome    Left elbow   Depression    ETOH abuse    GERD (gastroesophageal reflux disease)    05/11/17- not recently   Hemorrhoids    History of kidney stones    passed   Hypertension    MRSA (methicillin resistant Staphylococcus aureus) carrier    Neuromuscular disorder (HCC)    some fibromyalgia   Neuropathy    Osteopenia 07/2019   T score -1.9 FRAX 8.2% / 1.2%   PTSD (post-traumatic stress disorder)  2007 RIGHT HAND CRUSH INJURY W/ 2 FINGER AMPUTATION   Stroke (HCC) X2 CVA'S  IN 1990   LOSS OF VISION RIGHT Peripherial bilteral   Substance abuse (HCC)    hx: narcotic use   UC (ulcerative colitis) (HCC)    Weakness of right leg CAUSED BY RETRIEVAL THIGH MUSCLE FOR FLAP RIGHT HAND INJURY-- 2006   RIGHT LEG GIVES OUT OCCASIONALLY    Patient Active Problem List   Diagnosis Date Noted   Benign essential HTN 08/30/2023   Lipoma of back 07/18/2023   Cervical strain 02/09/2023   Gastroesophageal reflux disease without esophagitis 02/09/2023   Acute bacterial sinusitis 11/03/2022   Other spondylosis with radiculopathy, cervical region 10/28/2020   Carpal tunnel syndrome, left upper limb 10/28/2020   Change in bowel habits 07/27/2020   Chronic abdominal pain 07/27/2020   Rectal bleeding 07/27/2020   Aortic atherosclerosis (HCC)    COPD (chronic obstructive pulmonary disease) (HCC) 02/20/2019   Chronic constipation 07/31/2018   LUQ abdominal pain 07/31/2018   Alcohol use disorder, mild, in early remission 07/31/2018   Internal hemorrhoids 07/31/2018   Lesion of ulnar nerve, left upper limb 03/19/2018   MRSA (methicillin resistant staph aureus) culture positive 06/15/2017   Simple chronic bronchitis (HCC) 12/08/2016  Acute sinusitis 12/13/2013   Acute bronchitis 12/13/2013   Laceration of finger 10/06/2013   PTSD (post-traumatic stress disorder)     Past Surgical History:  Procedure Laterality Date   AMPUTATION Left 05/13/2017   Procedure: Left small finger revision amputation ;  Surgeon: Camella Fallow, MD;  Location: [facility];  Service: Orthopedics;  Laterality: Left;  60 mins   CARPAL TUNNEL RELEASE Left 12/07/2020   Procedure: LEFT CARPAL TUNNEL RELEASE;  Surgeon: Barbarann Oneil BROCKS, MD;  Location: [facility];  Service: Orthopedics;  Laterality: Left;   COLON SURGERY     15 years ago- fistula on colon removed   COSMETIC SURGERY     right leg and abdomen   DILATATION  & CURETTAGE/HYSTEROSCOPY WITH MYOSURE N/A 06/14/2016   Procedure: DILATATION & CURETTAGE/HYSTEROSCOPY WITH MYOSURE;  Surgeon: Evalene SHAUNNA Organ, MD;  Location: [facility];  Service: Gynecology;  Laterality: N/A;   HAND CARPECTOMY  12/24/2005   RIGHT-- INCLUDING  REMOVAL  WRIST WIRES AND I &D (EXICIOSNAL)  FOR OSTEOMYELITIS   HYSTEROSCOPY WITH D & C  08/18/2011   Procedure: DILATATION AND CURETTAGE (D&C) /HYSTEROSCOPY;  Surgeon: Evalene SHAUNNA Organ, MD;  Location: [facility];  Service: Gynecology;  Laterality: N/A;   INCISION / DRAINAGE HAND / FINGER  X3  (2006 & 2007)   RIGHT CRUSH WOUND   LESION REMOVAL  08/18/2011   Procedure: EXCISION VAGINAL LESION;  Surgeon: Evalene SHAUNNA Organ, MD;  Location: [facility];  Service: Gynecology;  Laterality: N/A;   LIPOMA EXCISION N/A 07/18/2023   Procedure: EXCISION LIPOMA, BACK;  Surgeon: Evonnie Dorothyann LABOR, DO;  Location: [facility];  Service: General;  Laterality: N/A;   ORIF CARPAL BONE FRACTURE  08/31/2005   I & D OF RIGHT HAND CRUSH INJURY/ OPEN FX'S AND INDEX FINGER RAY RESECTION/ COMPLICATED CLOSURE   ORIF RADIAL FRACTURE  09/04/2005   DISTAL RADIAL PLATE AND PARTIAL THUMB AMPUTATION   RIGHT TOTAL WRIST FUSION W/ BONE GRAFT AND PLATE  91/69/7992   AND FLAP USING RIGHT GOIN MUSCLE   skin cancer removal  2007   throat area   TUBAL LIGATION  15 YRS AGO    OB History     Gravida  6   Para  6   Term      Preterm      AB      Living  6      SAB      IAB      Ectopic      Multiple      Live Births               Home Medications    Prior to Admission medications   Medication Sig Start Date End Date Taking? Authorizing Provider  albuterol  (VENTOLIN  HFA) 108 (90 Base) MCG/ACT inhaler TAKE 2 PUFFS BY MOUTH EVERY 6 HOURS AS NEEDED FOR WHEEZE OR SHORTNESS OF BREATH 12/25/23  Yes Duanne Butler DASEN, MD  budesonide -formoterol  (SYMBICORT ) 80-4.5 MCG/ACT inhaler Inhale 2 puffs into the lungs in the  morning and at bedtime. 12/29/23  Yes Richad Jon HERO, NP  estradiol  (ESTRACE ) 0.1 MG/GM vaginal cream Place 1 Applicatorful vaginally every other day. Apply a pea-sized (1/2 inch) amount every other day 06/10/22  Yes [provider]  famotidine  (PEPCID ) 20 MG tablet TAKE 1 TABLET BY MOUTH EVERYDAY AT BEDTIME 08/28/23  Yes Howard, [employer], FNP  hydrochlorothiazide  (HYDRODIURIL ) 12.5 MG tablet TAKE 1 TABLET BY MOUTH EVERY DAY 09/25/23  Yes Duanne Butler DASEN, MD  linaclotide  (LINZESS ) 145 MCG CAPS capsule TAKE 1 CAPSULE BY MOUTH EVERY DAY BEFORE BREAKFAST 11/28/23  Yes Duanne Butler DASEN, MD  pantoprazole  (PROTONIX ) 40 MG tablet TAKE 1 TABLET BY MOUTH EVERY DAY 12/18/23  Yes Duanne Butler DASEN, MD  ADVAIR  HFA 230-21 MCG/ACT inhaler INHALE 2 PUFFS INTO THE LUNGS 2 (TWO) TIMES DAILY. Patient not taking: Reported on 12/05/2023 06/08/23   Hope Almarie ORN, NP  EPINEPHrine  0.3 mg/0.3 mL IJ SOAJ injection Inject into the muscle. 07/09/21   [provider]  hydrOXYzine  (VISTARIL ) 25 MG capsule Take 1 capsule (25 mg total) by mouth every 8 (eight) hours as needed for anxiety or itching. Patient not taking: Reported on 12/29/2023 12/05/23   Duanne Butler DASEN, MD  Spacer/Aero-Holding Chambers (AEROCHAMBER MV) inhaler Use as instructed 06/23/21   Gladis Leonor HERO, MD    Family History Family History  Problem Relation Age of Onset   Lupus Mother        died at 41   Breast cancer Maternal Aunt 35   Breast cancer Maternal Grandmother 70   Stomach cancer Paternal Grandfather    Colon cancer Neg Hx    Rectal cancer Neg Hx    Esophageal cancer Neg Hx    Inflammatory bowel disease Neg Hx    Liver disease Neg Hx    Pancreatic cancer Neg Hx     Social History Social History   Tobacco Use   Smoking status: Former    Current packs/day: 0.00    Average packs/day: 1.5 packs/day for 28.0 years (42.0 ttl pk-yrs)    Types: Cigarettes    Start date: 11/29/1973    Quit date: 11/29/2001    Years  since quitting: 22.0   Smokeless tobacco: Never  Vaping Use   Vaping status: Never Used  Substance Use Topics   Alcohol use: Yes    Comment: wine daily   Drug use: Not Currently    Types: Marijuana    Comment: last smoked 12-08-20     Allergies   Aspirin, Cashew nut oil, Breztri aerosphere [budeson-glycopyrrol-formoterol ], Codeine , and Vicodin [hydrocodone -acetaminophen ]   Review of Systems Review of Systems  Respiratory:  Positive for shortness of breath.   Neurological:  Positive for headaches.     Physical Exam Triage Vital Signs ED Triage Vitals  Encounter Vitals Group     BP 12/29/23 1313 (!) 159/84     Systolic BP Percentile --      Diastolic BP Percentile --      Pulse Rate 12/29/23 1313 70     Resp 12/29/23 1313 20     Temp 12/29/23 1313 98.2 F (36.8 C)     Temp Source 12/29/23 1313 Oral     SpO2 12/29/23 1313 97 %     Weight --      Height --      Head Circumference --      Peak Flow --      Pain Score 12/29/23 1314 3     Pain Loc --      Pain Education --      Exclude from Growth Chart --    No data found.  Updated Vital Signs BP (!) 148/99 (BP Location: Left Arm)   Pulse 70   Temp 98.2 F (36.8 C) (Oral)   Resp 20   SpO2 97%   Visual Acuity Right Eye Distance:   Left Eye Distance:   Bilateral Distance:    Right Eye Near:  Left Eye Near:    Bilateral Near:     Physical Exam Constitutional:      Appearance: She is well-developed. She is not ill-appearing.  Neck:     Thyroid : No thyromegaly.  Cardiovascular:     Rate and Rhythm: Normal rate and regular rhythm.  Pulmonary:     Effort: Pulmonary effort is normal.     Breath sounds: Wheezing present.     Comments: Diffuse wheezing Musculoskeletal:     Cervical back: Normal range of motion and neck supple.  Neurological:     Mental Status: She is alert.      UC Treatments / Results  Labs (all labs ordered are listed, but only abnormal results are displayed) Labs Reviewed - No  data to display  EKG   Radiology No results found.  Procedures Procedures (including critical care time)  Medications Ordered in UC Medications  albuterol  (PROVENTIL ) (2.5 MG/3ML) 0.083% nebulizer solution 2.5 mg (2.5 mg Nebulization Given 12/29/23 1342)    Initial Impression / Assessment and Plan / UC Course  I have reviewed the triage vital signs and the nursing notes.  Pertinent labs & imaging results that were available during my care of the patient were reviewed by me and considered in my medical decision making (see chart for details).    Lungs CTA B after albuterol  nebs. I suspect her SOB feeling is from wheezing and that albuterol  isn't helping her much as she isn't using a spacer - advised to use spacer with albuterol . Discussed options since she will not take advair  (doesn't like how it makes her feel). Has in the past taken symbicort  (I see it on her med list in 2015 but doesn't recall if she had trouble with it. Did have urinary retention with Lorraine aerosphere which is listed on her allergy list due to urinary retention but she is not actually allergic to it, it had undesirable side effects for her. Breztri and symbicort  have some ingredients in common - pt is willing to try symbicort  again to see if she tolerates it and hopefully she will and it will help her breathing. She has f/u with pcp next week she says. I strongly recommended she restart her BP medicine   Final Clinical Impressions(s) / UC Diagnoses   Final diagnoses:  Wheezing  COPD exacerbation (HCC)     Discharge Instructions      Use the spacer when you use albuterol .   Use the Symbicort  (budesonide /formoterol ) inhaler instead of the advair  to see if you tolerate it better.   I do recommend you start taking your blood pressure medicine again.    ED Prescriptions     Medication Sig Dispense Auth. Provider   budesonide -formoterol  (SYMBICORT ) 80-4.5 MCG/ACT inhaler Inhale 2 puffs into the lungs in  the morning and at bedtime. 1 each Richad Jon HERO, NP      PDMP not reviewed this encounter.   Richad Jon HERO, NP 12/29/23 [phone]

## 2023-12-29 NOTE — ED Triage Notes (Signed)
 Pt reports intermittent episodes of SOB and headaches x2.16months. Reports the SOB is worse in the mornings when she first wakes up. Pt states her symptoms started after her MD increased her advair  inhaler dose so she stopped taking it. Pt reports she is using her rescue albuterol  inhaler 12-14 times/day. Using tylenol  and benadryl for the headaches. Pt also reports she stopped taking bp meds last week because she doesn't feel good when taking them, MD unaware. Reports she has follow ups with PCP and ENT in June.

## 2024-01-02 ENCOUNTER — Encounter: Admitting: Family Medicine

## 2024-01-05 ENCOUNTER — Encounter: Payer: Self-pay | Admitting: Family Medicine

## 2024-01-05 ENCOUNTER — Ambulatory Visit: Admitting: Family Medicine

## 2024-01-05 VITALS — BP 126/72 | HR 62 | Temp 97.8°F | Ht 62.0 in | Wt 154.0 lb

## 2024-01-05 DIAGNOSIS — Z0000 Encounter for general adult medical examination without abnormal findings: Secondary | ICD-10-CM

## 2024-01-05 DIAGNOSIS — J449 Chronic obstructive pulmonary disease, unspecified: Secondary | ICD-10-CM

## 2024-01-05 DIAGNOSIS — E7800 Pure hypercholesterolemia, unspecified: Secondary | ICD-10-CM | POA: Diagnosis not present

## 2024-01-05 DIAGNOSIS — Z0001 Encounter for general adult medical examination with abnormal findings: Secondary | ICD-10-CM | POA: Diagnosis not present

## 2024-01-05 DIAGNOSIS — I10 Essential (primary) hypertension: Secondary | ICD-10-CM | POA: Diagnosis not present

## 2024-01-05 DIAGNOSIS — Z780 Asymptomatic menopausal state: Secondary | ICD-10-CM

## 2024-01-05 MED ORDER — BUDESONIDE-FORMOTEROL FUMARATE 80-4.5 MCG/ACT IN AERO: 2.0000 | INHALATION_SPRAY | Freq: Two times a day (BID) | RESPIRATORY_TRACT | 11 refills | Status: AC

## 2024-01-05 NOTE — Progress Notes (Signed)
 Subjective:    Patient ID: [name], female    DOB: 1961-01-29, 63 y.o.   MRN: [id number] Patient is here today for a complete physical exam.  Her last colonoscopy was in 2014.  She had a Cologuard in 2023 that was negative.  She is due for repeat Cologuard in 2026.  Her last mammogram was in November 2024.  She is due for this again in November of this year.  She is overdue for a bone density test.  She is also overdue for a Pap smear.  She would like me to schedule a bone density test but she declines a Pap smear today.  Her immunizations are up-to-date except for the RSV vaccine as well as Shingrix.  She declines any vaccinations today. Past Medical History:  Diagnosis Date   Anxiety    Aortic atherosclerosis (HCC)    Arthritis    Asthma    Bed bug bite    Blood transfusion without reported diagnosis    CA - skin cancer    Chronic pain due to injury RIGHT HAND/WRIST IN 2007   Complication of anesthesia    unable to urinate after surgery   COPD (chronic obstructive pulmonary disease) (HCC)    Stage 1   Crush injury of hand RIGHT IN 2007   X 6 SURG'S  LAST ONE BEING TOTAL WRIST FUSION W/ BONE GRAFT AND PLATE   Cubital tunnel syndrome    Left elbow   Depression    ETOH abuse    GERD (gastroesophageal reflux disease)    05/11/17- not recently   Hemorrhoids    History of kidney stones    passed   Hypertension    MRSA (methicillin resistant Staphylococcus aureus) carrier    Neuromuscular disorder (HCC)    some fibromyalgia   Neuropathy    Osteopenia 07/2019   T score -1.9 FRAX 8.2% / 1.2%   PTSD (post-traumatic stress disorder) 2007 RIGHT HAND CRUSH INJURY W/ 2 FINGER AMPUTATION   Stroke (HCC) X2 CVA'S  IN 1990   LOSS OF VISION RIGHT Peripherial bilteral   Substance abuse (HCC)    hx: narcotic use   UC (ulcerative colitis) (HCC)    Weakness of right leg CAUSED BY RETRIEVAL THIGH MUSCLE FOR FLAP RIGHT HAND INJURY-- 2006   RIGHT LEG GIVES OUT OCCASIONALLY   Past  Surgical History:  Procedure Laterality Date   AMPUTATION Left 05/13/2017   Procedure: Left small finger revision amputation ;  Surgeon: Ronn Cohn, MD;  Location: [facility];  Service: Orthopedics;  Laterality: Left;  60 mins   CARPAL TUNNEL RELEASE Left 12/07/2020   Procedure: LEFT CARPAL TUNNEL RELEASE;  Surgeon: Adah Acron, MD;  Location: [facility];  Service: Orthopedics;  Laterality: Left;   COLON SURGERY     15 years ago- fistula on colon removed   COSMETIC SURGERY     right leg and abdomen   DILATATION & CURETTAGE/HYSTEROSCOPY WITH MYOSURE N/A 06/14/2016   Procedure: DILATATION & CURETTAGE/HYSTEROSCOPY WITH MYOSURE;  Surgeon: Lacretia Piccolo, MD;  Location: [facility];  Service: Gynecology;  Laterality: N/A;   HAND CARPECTOMY  12/24/2005   RIGHT-- INCLUDING  REMOVAL  WRIST WIRES AND I &D (EXICIOSNAL)  FOR OSTEOMYELITIS   HYSTEROSCOPY WITH D & C  08/18/2011   Procedure: DILATATION AND CURETTAGE (D&C) /HYSTEROSCOPY;  Surgeon: Lacretia Piccolo, MD;  Location: [facility];  Service: Gynecology;  Laterality: N/A;   INCISION / DRAINAGE HAND / FINGER  X3  (2006 & 2007)   RIGHT CRUSH WOUND   LESION REMOVAL  08/18/2011   Procedure: EXCISION VAGINAL LESION;  Surgeon: Lacretia Piccolo, MD;  [facility];  Service: Gynecology;  Laterality: N/A;   LIPOMA EXCISION N/A 07/18/2023   Procedure: EXCISION LIPOMA, BACK;  Surgeon: Marijo Shove, DO;  Location: [facility];  Service: General;  Laterality: N/A;   ORIF CARPAL BONE FRACTURE  08/31/2005   I & D OF RIGHT HAND CRUSH INJURY/ OPEN FX'S AND INDEX FINGER RAY RESECTION/ COMPLICATED CLOSURE   ORIF RADIAL FRACTURE  09/04/2005   DISTAL RADIAL PLATE AND PARTIAL THUMB AMPUTATION   RIGHT TOTAL WRIST FUSION W/ BONE GRAFT AND PLATE  08/65/7846   AND FLAP USING RIGHT GOIN MUSCLE   skin cancer removal  2007   throat area   TUBAL LIGATION  15 YRS AGO   Current Outpatient Medications on File  Prior to Visit  Medication Sig Dispense Refill   albuterol  (VENTOLIN  HFA) 108 (90 Base) MCG/ACT inhaler TAKE 2 PUFFS BY MOUTH EVERY 6 HOURS AS NEEDED FOR WHEEZE OR SHORTNESS OF BREATH 8.5 each 0   estradiol (ESTRACE) 0.1 MG/GM vaginal cream Place 1 Applicatorful vaginally every other day. Apply a pea-sized (1/2 inch) amount every other day     famotidine  (PEPCID ) 20 MG tablet TAKE 1 TABLET BY MOUTH EVERYDAY AT BEDTIME 90 tablet 1   hydrochlorothiazide  (HYDRODIURIL ) 12.5 MG tablet TAKE 1 TABLET BY MOUTH EVERY DAY 90 tablet 1   linaclotide  (LINZESS ) 145 MCG CAPS capsule TAKE 1 CAPSULE BY MOUTH EVERY DAY BEFORE BREAKFAST 90 capsule 0   pantoprazole  (PROTONIX ) 40 MG tablet TAKE 1 TABLET BY MOUTH EVERY DAY 90 tablet 0   Spacer/Aero-Holding Chambers (AEROCHAMBER MV) inhaler Use as instructed 1 each 0   ADVAIR  HFA 230-21 MCG/ACT inhaler INHALE 2 PUFFS INTO THE LUNGS 2 (TWO) TIMES DAILY. (Patient not taking: Reported on 12/05/2023) 12 each 0   EPINEPHrine  0.3 mg/0.3 mL IJ SOAJ injection Inject into the muscle. (Patient not taking: Reported on 01/05/2024)     hydrOXYzine  (VISTARIL ) 25 MG capsule Take 1 capsule (25 mg total) by mouth every 8 (eight) hours as needed for anxiety or itching. (Patient not taking: Reported on 01/05/2024) 30 capsule 0   No current facility-administered medications on file prior to visit.   Allergies  Allergen Reactions   Aspirin Shortness Of Breath    ASTHMATIC ATTACK  Other Reaction(s): Not available, Not available, Not available   Cashew Nut Oil Anaphylaxis    Cashew Allergy   Breztri Aerosphere [Budeson-Glycopyrrol-Formoterol ] Other (See Comments)    Urinary retention   Codeine  Other (See Comments)    ABNORMAL BEHAVIOR  Increased in bad moods  NO CODEINE  BASED MEDICATIONS    Vicodin [Hydrocodone -Acetaminophen ] Other (See Comments)    Extreme anxiety   Social History   Socioeconomic History   Marital status: Married    Spouse name: Not on file   Number of  children: 6   Years of education: Not on file   Highest education level: Not on file  Occupational History   Occupation: Housekeeper   Tobacco Use   Smoking status: Former    Current packs/day: 0.00    Average packs/day: 1.5 packs/day for 28.0 years (42.0 ttl pk-yrs)    Types: Cigarettes    Start date: 11/29/1973    Quit date: 11/29/2001    Years since quitting: 22.1   Smokeless tobacco: Never  Vaping Use   Vaping status: Never Used  Substance and Sexual Activity   Alcohol use: Yes    Comment: wine daily   Drug use: Not Currently    Types: Marijuana    Comment: last smoked 12-08-20   Sexual activity: Yes    Partners: Male    Birth control/protection: Post-menopausal  Other Topics Concern   Not on file  Social History Narrative   Lives with husband.     Social Drivers of Corporate investment banker Strain: Low Risk  (11/10/2022)   Overall Financial Resource Strain (CARDIA)    Difficulty of Paying Living Expenses: Not very hard  Food Insecurity: No Food Insecurity (11/10/2022)   Hunger Vital Sign    Worried About Running Out of Food in the Last Year: Never true    Ran Out of Food in the Last Year: Never true  Transportation Needs: No Transportation Needs (11/10/2022)   PRAPARE - Administrator, Civil Service (Medical): No    Lack of Transportation (Non-Medical): No  Physical Activity: Insufficiently Active (11/10/2022)   Exercise Vital Sign    Days of Exercise per Week: 3 days    Minutes of Exercise per Session: 30 min  Stress: No Stress Concern Present (11/10/2022)   [employer] of Occupational Health - Occupational Stress Questionnaire    Feeling of Stress : Not at all  Social Connections: Moderately Isolated (11/10/2022)   Social Connection and Isolation Panel [NHANES]    Frequency of Communication with Friends and Family: More than three times a week    Frequency of Social Gatherings with Friends and Family: Once a week    Attends Religious Services:  Never    Database administrator or Organizations: No    Attends Banker Meetings: Never    Marital Status: Married  Catering manager Violence: Not At Risk (11/10/2022)   Humiliation, Afraid, Rape, and Kick questionnaire    Fear of Current or Ex-Partner: No    Emotionally Abused: No    Physically Abused: No    Sexually Abused: No     Review of Systems  Gastrointestinal:  Positive for abdominal pain.  All other systems reviewed and are negative.      Objective:   Physical Exam Vitals reviewed.  Constitutional:      General: She is not in acute distress.    Appearance: Normal appearance. She is normal weight. She is not ill-appearing or toxic-appearing.  HENT:     Head: Normocephalic and atraumatic.     Right Ear: Tympanic membrane and ear canal normal.     Left Ear: Tympanic membrane and ear canal normal.     Nose: Nose normal. No congestion or rhinorrhea.     Mouth/Throat:     Mouth: Mucous membranes are moist.     Pharynx: Oropharynx is clear. No oropharyngeal exudate or posterior oropharyngeal erythema.  Eyes:     Extraocular Movements: Extraocular movements intact.     Conjunctiva/sclera: Conjunctivae normal.     Pupils: Pupils are equal, round, and reactive to light.  Neck:     Vascular: No carotid bruit.  Cardiovascular:     Rate and Rhythm: Normal rate and regular rhythm.     Heart sounds: Normal heart sounds. No murmur heard. Pulmonary:     Effort: Pulmonary effort is normal. No respiratory distress.     Breath sounds: Normal breath sounds. Decreased air movement present. No stridor. No wheezing, rhonchi or rales.  Abdominal:     General: Bowel sounds are normal. There is  no distension.     Palpations: Abdomen is soft. There is no mass.     Tenderness: There is no abdominal tenderness. There is no guarding or rebound.  Musculoskeletal:        General: Deformity present.     Right lower leg: No edema.     Left lower leg: No edema.   Lymphadenopathy:     Cervical: No cervical adenopathy.  Skin:    Findings: No lesion or rash.  Neurological:     General: No focal deficit present.     Mental Status: She is alert and oriented to person, place, and time. Mental status is at baseline.     Cranial Nerves: No cranial nerve deficit.     Sensory: No sensory deficit.     Coordination: Coordination normal.     Deep Tendon Reflexes: Reflexes normal.  Psychiatric:        Mood and Affect: Mood normal.        Behavior: Behavior normal.        Thought Content: Thought content normal.        Judgment: Judgment normal.           Assessment & Plan:  Postmenopausal estrogen deficiency - Plan: DG Bone Density  Pure hypercholesterolemia - Plan: CBC with Differential/Platelet, COMPLETE METABOLIC PANEL WITHOUT GFR, Lipid panel  Benign essential HTN  Chronic obstructive pulmonary disease, unspecified COPD type (HCC)  General medical exam Patient's blood pressure is excellent.  She is due for a CBC a CMP and a lipid panel.  I like to see her LDL cholesterol less than 161.  She has a history of COPD.  I encouraged compliance with Symbicort .  She is overdue for a bone density test.  I will schedule this.  Her Cologuard is due again in 2026.  Her mammogram is due in November 2025.  She is overdue for a Pap smear.  She declines a Pap smear today.  Recommended the shingles vaccine as well as the RSV vaccine.  Regular anticipatory guidance is provided.

## 2024-01-06 LAB — COMPLETE METABOLIC PANEL WITHOUT GFR
AG Ratio: 2 (calc) (ref 1.0–2.5)
ALT: 20 U/L (ref 6–29)
AST: 21 U/L (ref 10–35)
Albumin: 4.5 g/dL (ref 3.6–5.1)
Alkaline phosphatase (APISO): 52 U/L (ref 37–153)
BUN: 13 mg/dL (ref 7–25)
CO2: 27 mmol/L (ref 20–32)
Calcium: 9.9 mg/dL (ref 8.6–10.4)
Chloride: 103 mmol/L (ref 98–110)
Creat: 0.84 mg/dL (ref 0.50–1.05)
Globulin: 2.3 g/dL (ref 1.9–3.7)
Glucose, Bld: 86 mg/dL (ref 65–99)
Potassium: 3.8 mmol/L (ref 3.5–5.3)
Sodium: 140 mmol/L (ref 135–146)
Total Bilirubin: 0.5 mg/dL (ref 0.2–1.2)
Total Protein: 6.8 g/dL (ref 6.1–8.1)

## 2024-01-06 LAB — LIPID PANEL
Cholesterol: 269 mg/dL — ABNORMAL HIGH (ref <200)
HDL: 55 mg/dL (ref >=50)
LDL Cholesterol (Calc): 176 mg/dL — ABNORMAL HIGH
Non-HDL Cholesterol (Calc): 214 mg/dL — ABNORMAL HIGH (ref <130)
Total CHOL/HDL Ratio: 4.9 (calc) (ref <5.0)
Triglycerides: 214 mg/dL — ABNORMAL HIGH (ref <150)

## 2024-01-06 LAB — CBC WITH DIFFERENTIAL/PLATELET
Absolute Lymphocytes: 4044 {cells}/uL — ABNORMAL HIGH (ref 850–3900)
Absolute Monocytes: 1123 {cells}/uL — ABNORMAL HIGH (ref 200–950)
Basophils Absolute: 76 {cells}/uL (ref 0–200)
Basophils Relative: 0.7 %
Eosinophils Absolute: 654 {cells}/uL — ABNORMAL HIGH (ref 15–500)
Eosinophils Relative: 6 %
HCT: 39.3 % (ref 35.0–45.0)
Hemoglobin: 13 g/dL (ref 11.7–15.5)
MCH: 30.6 pg (ref 27.0–33.0)
MCHC: 33.1 g/dL (ref 32.0–36.0)
MCV: 92.5 fL (ref 80.0–100.0)
MPV: 10 fL (ref 7.5–12.5)
Monocytes Relative: 10.3 %
Neutro Abs: 5003 {cells}/uL (ref 1500–7800)
Neutrophils Relative %: 45.9 %
Platelets: 338 10*3/uL (ref 140–400)
RBC: 4.25 10*6/uL (ref 3.80–5.10)
RDW: 12.5 % (ref 11.0–15.0)
Total Lymphocyte: 37.1 %
WBC: 10.9 10*3/uL — ABNORMAL HIGH (ref 3.8–10.8)

## 2024-01-08 ENCOUNTER — Telehealth: Payer: Self-pay

## 2024-01-08 ENCOUNTER — Other Ambulatory Visit: Payer: Self-pay

## 2024-01-08 DIAGNOSIS — E7800 Pure hypercholesterolemia, unspecified: Secondary | ICD-10-CM

## 2024-01-08 MED ORDER — ATORVASTATIN CALCIUM 20 MG PO TABS: 20.0000 mg | ORAL_TABLET | Freq: Every day | ORAL | 3 refills | Status: AC

## 2024-01-08 MED ORDER — ROSUVASTATIN CALCIUM 10 MG PO TABS: 10.0000 mg | ORAL_TABLET | Freq: Every day | ORAL | 3 refills | Status: DC

## 2024-01-08 NOTE — Telephone Encounter (Signed)
 Copied from CRM [id number]. Topic: Clinical - Prescription Issue >> Jan 08, 2024  1:02 PM Carlatta H wrote: Reason for CRM: Patient called to advise that prescription for rosuvastatin  (CRESTOR ) 10 MG tablet [[id number]] that was called in today she cannot take because it makes her sick//Please call patient to advise of an alternate medication

## 2024-02-12 ENCOUNTER — Encounter (INDEPENDENT_AMBULATORY_CARE_PROVIDER_SITE_OTHER): Payer: Self-pay | Admitting: Otolaryngology

## 2024-02-12 ENCOUNTER — Ambulatory Visit (INDEPENDENT_AMBULATORY_CARE_PROVIDER_SITE_OTHER): Admitting: Otolaryngology

## 2024-02-12 VITALS — HR 62

## 2024-02-12 DIAGNOSIS — K219 Gastro-esophageal reflux disease without esophagitis: Secondary | ICD-10-CM | POA: Diagnosis not present

## 2024-02-12 DIAGNOSIS — R0982 Postnasal drip: Secondary | ICD-10-CM

## 2024-02-12 DIAGNOSIS — R0981 Nasal congestion: Secondary | ICD-10-CM

## 2024-02-12 DIAGNOSIS — H9313 Tinnitus, bilateral: Secondary | ICD-10-CM | POA: Diagnosis not present

## 2024-02-12 DIAGNOSIS — R09A2 Foreign body sensation, throat: Secondary | ICD-10-CM | POA: Diagnosis not present

## 2024-02-12 NOTE — Progress Notes (Signed)
 ENT CONSULT:  Reason for Consult: globus sensation and hx of severe GERD, ringing in her ears  HPI: Discussed the use of AI scribe software for clinical note transcription with the patient, who gave verbal consent to proceed.  History of Present Illness [name] is a 63 year old female with severe acid reflux who presents with a sensation of a lump in her throat.  She experiences a sensation of a lump in her throat, which she associates with her severe acid reflux. Despite managing her reflux with two pills in the morning and two pills at night, she continues to experience symptoms, especially after eating more than a small amount. She has not had difficulty swallowing but feels a 'big lump' in her throat when swallowing.  Her reflux symptoms are severe enough to require her to sleep with her bed tilted up. She has been on her current morning reflux medication for several years and the evening medication for about a year, with a recent dosage increase. She has made dietary changes to manage her reflux, avoiding fried foods and sugar, which trigger her symptoms. Caffeine, specifically coffee, does not exacerbate her symptoms.  Additionally, she experiences significant ringing in her ears, although no ear pain is present.  She has a history of multiple surgeries following an industrial accident that resulted in right hand injury. She underwent 18 surgeries over two years, including nerve blocks to manage pain. Certain blood pressure and cholesterol medications previously caused severe chest pain, which resolved upon discontinuation.     Records Reviewed:  [facility] visit 12/29/23 [name] is a 63 y.o. female. Pt reports intermittent episodes of SOB and headaches x2.19months. Now daily is SOB. Reports the SOB is worse in the mornings when she first wakes up. Pt states her symptoms started after her MD increased her advair  inhaler dose but the increased dose irritated her throat so she  stopped taking it. Pt reports she is using her rescue albuterol  inhaler 12-14, up to 20 times/day; is not using a spacer. Albuterol  helps a little temporarily Using tylenol  and benadryl for the headaches. Pt also reports she stopped taking bp meds last week because she doesn't feel good when taking them, PCP is unaware. Reports she has follow ups with PCP next week and ENT in June. Primary concern is wheezing/SOB   She was given inhaler Rx for Symbicort     Past Medical History:  Diagnosis Date   Anxiety    Aortic atherosclerosis (HCC)    Arthritis    Asthma    Bed bug bite    Blood transfusion without reported diagnosis    CA - skin cancer    Chronic pain due to injury RIGHT HAND/WRIST IN 2007   Complication of anesthesia    unable to urinate after surgery   COPD (chronic obstructive pulmonary disease) (HCC)    Stage 1   Crush injury of hand RIGHT IN 2007   X 6 SURG'S  LAST ONE BEING TOTAL WRIST FUSION W/ BONE GRAFT AND PLATE   Cubital tunnel syndrome    Left elbow   Depression    ETOH abuse    GERD (gastroesophageal reflux disease)    05/11/17- not recently   Hemorrhoids    History of kidney stones    passed   Hypertension    MRSA (methicillin resistant Staphylococcus aureus) carrier    Neuromuscular disorder (HCC)    some fibromyalgia   Neuropathy    Osteopenia 07/2019   T score -1.9  FRAX 8.2% / 1.2%   PTSD (post-traumatic stress disorder) 2007 RIGHT HAND CRUSH INJURY W/ 2 FINGER AMPUTATION   Stroke (HCC) X2 CVA'S  IN 1990   LOSS OF VISION RIGHT Peripherial bilteral   Substance abuse (HCC)    hx: narcotic use   UC (ulcerative colitis) (HCC)    Weakness of right leg CAUSED BY RETRIEVAL THIGH MUSCLE FOR FLAP RIGHT HAND INJURY-- 2006   RIGHT LEG GIVES OUT OCCASIONALLY    Past Surgical History:  Procedure Laterality Date   AMPUTATION Left 05/13/2017   Procedure: Left small finger revision amputation ;  Surgeon: Ronn Cohn, MD;  Location: [facility];  Service:  Orthopedics;  Laterality: Left;  60 mins   CARPAL TUNNEL RELEASE Left 12/07/2020   Procedure: LEFT CARPAL TUNNEL RELEASE;  Surgeon: Adah Acron, MD;  Location:  SURGERY CENTER;  Service: Orthopedics;  Laterality: Left;   COLON SURGERY     15 years ago- fistula on colon removed   COSMETIC SURGERY     right leg and abdomen   DILATATION & CURETTAGE/HYSTEROSCOPY WITH MYOSURE N/A 06/14/2016   Procedure: DILATATION & CURETTAGE/HYSTEROSCOPY WITH MYOSURE;  Surgeon: Lacretia Piccolo, MD;  Location: [facility];  Service: Gynecology;  Laterality: N/A;   HAND CARPECTOMY  12/24/2005   RIGHT-- INCLUDING  REMOVAL  WRIST WIRES AND I &D (EXICIOSNAL)  FOR OSTEOMYELITIS   HYSTEROSCOPY WITH D & C  08/18/2011   Procedure: DILATATION AND CURETTAGE (D&C) /HYSTEROSCOPY;  Surgeon: Lacretia Piccolo, MD;  Location: [facility];  Service: Gynecology;  Laterality: N/A;   INCISION / DRAINAGE HAND / FINGER  X3  (2006 & 2007)   RIGHT CRUSH WOUND   LESION REMOVAL  08/18/2011   Procedure: EXCISION VAGINAL LESION;  Surgeon: Lacretia Piccolo, MD;  Location: [facility];  Service: Gynecology;  Laterality: N/A;   LIPOMA EXCISION N/A 07/18/2023   Procedure: EXCISION LIPOMA, BACK;  Surgeon: Marijo Shove, DO;  Location: [facility];  Service: General;  Laterality: N/A;   ORIF CARPAL BONE FRACTURE  08/31/2005   I & D OF RIGHT HAND CRUSH INJURY/ OPEN FX'S AND INDEX FINGER RAY RESECTION/ COMPLICATED CLOSURE   ORIF RADIAL FRACTURE  09/04/2005   DISTAL RADIAL PLATE AND PARTIAL THUMB AMPUTATION   RIGHT TOTAL WRIST FUSION W/ BONE GRAFT AND PLATE  56/21/3086   AND FLAP USING RIGHT GOIN MUSCLE   skin cancer removal  2007   throat area   TUBAL LIGATION  15 YRS AGO    Family History  Problem Relation Age of Onset   Lupus Mother        died at 38   Breast cancer Maternal Aunt 35   Breast cancer Maternal Grandmother 15   Stomach cancer Paternal Grandfather    Colon cancer Neg Hx     Rectal cancer Neg Hx    Esophageal cancer Neg Hx    Inflammatory bowel disease Neg Hx    Liver disease Neg Hx    Pancreatic cancer Neg Hx     Social History:  reports that she quit smoking about 22 years ago. Her smoking use included cigarettes. She started smoking about 50 years ago. She has a 42 pack-year smoking history. She has never used smokeless tobacco. She reports current alcohol use. She reports that she does not currently use drugs after having used the following drugs: Marijuana.  Allergies:  Allergies  Allergen Reactions   Aspirin Shortness Of Breath    ASTHMATIC ATTACK  Other Reaction(s): Not available, Not available, Not available   Cashew Nut Oil Anaphylaxis    Cashew Allergy   Breztri Aerosphere [Budeson-Glycopyrrol-Formoterol ] Other (See Comments)    Urinary retention   Codeine  Other (See Comments)    ABNORMAL BEHAVIOR  Increased in bad moods  NO CODEINE  BASED MEDICATIONS    Vicodin [Hydrocodone -Acetaminophen ] Other (See Comments)    Extreme anxiety    Medications: I have reviewed the patient's current medications.  The PMH, PSH, Medications, Allergies, and SH were reviewed and updated.  ROS: Constitutional: Negative for fever, weight loss and weight gain. Cardiovascular: Negative for chest pain and dyspnea on exertion. Respiratory: Is not experiencing shortness of breath at rest. Gastrointestinal: Negative for nausea and vomiting. Neurological: Negative for headaches. Psychiatric: The patient is not nervous/anxious  Pulse 62, SpO2 97%. There is no height or weight on file to calculate BMI.  PHYSICAL EXAM:  Exam: General: Well-developed, well-nourished Communication and Voice: Clear pitch and clarity Respiratory Respiratory effort: Equal inspiration and expiration without stridor Cardiovascular Peripheral Vascular: Warm extremities with equal color/perfusion Eyes: No nystagmus with equal extraocular motion bilaterally Neuro/Psych/Balance: Patient  oriented to person, place, and time; Appropriate mood and affect; Gait is intact with no imbalance; Cranial nerves I-XII are intact Head and Face Inspection: Normocephalic and atraumatic without mass or lesion Palpation: Facial skeleton intact without bony stepoffs Salivary Glands: No mass or tenderness Facial Strength: Facial motility symmetric and full bilaterally ENT Pinna: External ear intact and fully developed External canal: Canal is patent with intact skin Tympanic Membrane: Clear and mobile External Nose: No scar or anatomic deformity Internal Nose: Septum is S-shaped and deviated with narrowing of the R > L nasal passages. No polyp, or purulence. Mucosal edema and erythema present.  Bilateral inferior turbinate hypertrophy.  Lips, Teeth, and gums: Mucosa and teeth intact and viable TMJ: No pain to palpation with full mobility Oral cavity/oropharynx: No erythema or exudate, no lesions present Nasopharynx: No mass or lesion with intact mucosa Hypopharynx: Intact mucosa without pooling of secretions Larynx Glottic: Full true vocal cord mobility without lesion or mass Supraglottic: Normal appearing epiglottis and AE folds Interarytenoid Space: Moderate pachydermia&edema Subglottic Space: Patent without lesion or edema Neck Neck and Trachea: Midline trachea without mass or lesion Thyroid : No mass or nodularity Lymphatics: No lymphadenopathy  Procedure: Preoperative diagnosis:globus sensation hx of GERD   Postoperative diagnosis:   Same  Procedure: Flexible fiberoptic laryngoscopy  Surgeon: [name], MD  Anesthesia: Topical lidocaine  and Afrin Complications: None Condition is stable throughout exam  Indications and consent:  The patient presents to the clinic with above symptoms. Indirect laryngoscopy view was incomplete. Thus it was recommended that they undergo a flexible fiberoptic laryngoscopy. All of the risks, benefits, and potential complications were  reviewed with the patient preoperatively and verbal informed consent was obtained.  Procedure: The patient was seated upright in the clinic. Topical lidocaine  and Afrin were applied to the nasal cavity. After adequate anesthesia had occurred, I then proceeded to pass the flexible telescope into the nasal cavity. The nasal cavity was patent without rhinorrhea or polyp. The nasopharynx was also patent without mass or lesion. The base of tongue was visualized and was normal. There were no signs of pooling of secretions in the piriform sinuses. The true vocal folds were mobile bilaterally. There were no signs of glottic or supraglottic mucosal lesion or mass. There was moderate interarytenoid pachydermia and post cricoid edema. The telescope was then slowly withdrawn and the patient tolerated the procedure throughout.  Studies Reviewed: Allergy testing 07/09/21   Assessment/Plan: Encounter Diagnoses  Name Primary?   Tinnitus of both ears Yes   Chronic GERD    Globus sensation    Chronic nasal congestion    Post-nasal drip     Assessment and Plan Assessment & Plan Globus sensation and severe reflux sx  Chronic GERD with severe symptoms despite maximum medical therapy. Risk for Barrett's esophagitis due to chronic reflux. We discussed a need for GI referral . Her scope exam today showed evidence of GERD LPR but otherwise was reassuring including flexible scope exam without masses or lesions  - Refer to Gastroenterology for upper endoscopy to evaluate for Barrett's esophagitis. - Provided after-visit summary with information on reflux management and lifestyle modifications. - Recommend reflux supplement available online to be taken postprandially. - Order esophagram to rule out esophageal pathology  GERD LPR - Continue Protonix  40 mg in am and Pepcid  20 mg at night  -  Reflux Gourmet after meals - diet and lifestyle changes to minimize GERD - Refer to [employer] blog for dietary and  lifestyle modifications/reflux cook book  Bilateral non-pulsatile tinnitus  No ear pain, drainage or vertigo Tinnitus likely related to presbycusis. - Order audiometry to evaluate the extent of hearing loss.   RTC after audiogram and esophagram See GI referral sent today    Thank you for allowing me to participate in the care of this patient. Please do not hesitate to contact me with any questions or concerns.   [name], MD Otolaryngology [facility] Phone: [phone] Fax: [phone]    02/12/2024, 11:59 AM

## 2024-02-12 NOTE — Patient Instructions (Signed)

## 2024-02-16 ENCOUNTER — Ambulatory Visit: Admitting: Physician Assistant

## 2024-02-16 ENCOUNTER — Encounter: Payer: Self-pay | Admitting: Gastroenterology

## 2024-02-22 ENCOUNTER — Telehealth: Payer: Self-pay

## 2024-02-22 ENCOUNTER — Other Ambulatory Visit: Payer: Self-pay

## 2024-02-22 ENCOUNTER — Other Ambulatory Visit: Payer: Self-pay | Admitting: Family Medicine

## 2024-02-22 MED ORDER — LINACLOTIDE 145 MCG PO CAPS: 145.0000 ug | ORAL_CAPSULE | Freq: Every day | ORAL | 0 refills | Status: DC

## 2024-02-22 NOTE — Telephone Encounter (Signed)
 Prescription Request  02/22/2024  LOV: 01/05/24  What is the name of the medication or equipment? linaclotide  (LINZESS ) 145 MCG CAPS capsule [[id number]]   Have you contacted your pharmacy to request a refill? Yes   Which pharmacy would you like this sent to?  [id number] - [place], [place] - [address] [place] [address] Phone: [phone] Fax: [phone]    Patient notified that their request is being sent to the clinical staff for review and that they should receive a response within 2 business days.   Please advise at [facility] [phone]

## 2024-02-28 ENCOUNTER — Ambulatory Visit: Attending: Otolaryngology | Admitting: Audiologist

## 2024-02-28 DIAGNOSIS — H9313 Tinnitus, bilateral: Secondary | ICD-10-CM | POA: Insufficient documentation

## 2024-02-28 DIAGNOSIS — H903 Sensorineural hearing loss, bilateral: Secondary | ICD-10-CM | POA: Insufficient documentation

## 2024-02-28 NOTE — Procedures (Signed)
  [facility]patient [facility] [address] [place], [place] [address] [phone]  AUDIOLOGICAL EVALUATION  Name: [name]    Status: Outpatient DOB: [birth year]    Referent: [name], MD MRN: [id number] Date: 02/28/2024     Diagnosis: Tinnitus of both ears  Sensorineural hearing loss, bilateral   HISTORY: [name], 63 y.o., was seen for an audiological evaluation.   [name] notices increased tinnitus. She notes the tinnitus has been present for more than 6 years. It started in her right ear and is now in both ears, but still sounds louder in the right ear. She describes it as a continuous high frequency ringing. She noted that her tinnitus was worse when her blood pressure was high.  Airen notes no ear pain, dizziness, history of ear surgery or ear infection. She does have throat pain and a filling something is stuck in her throat.  [name] reports a history of occupational noise exposure. Further, she has history of strokes which impaired vision on the right side and she thinks also impaired the hearing in her right ear.         EVALUATION: Otoscopic inspection reveals clear ear canals with visible tympanic membranes.   Tympanometry was completed to assess middle ear status. Normal, Type A tympanograms were obtained bilaterally.   Standard audiometric techniques were used to obtain thresholds under headphones. Speech reception thresholds are 25 dBHL on the right and 15 dBHL on the left using recorded spondee word lists. Word recognition was 92% at 65 dBHL on the right and 100% at 55 dBHL on the left using recorded NU-6 word lists, in quiet. A mild high frequency sensorineural hearing loss was found at 3000Hz  and above in the left ear and a mild sloping to moderate high frequency sensorineural hearing loss was found at 1000Hz  and above in the right ear. There is a significant asymmetry of hearing between ears, with the right ear having poorer hearing.     CONCLUSION:  [name] has a mild high frequency sensorineural hearing loss in the left ear and a mild to moderate high frequency sensorineural hearing loss in the right ear.  Word recognition is good in quiet at conversational speech levels bilaterally. Findings were reviewed with the patient.   RECOMMENDATIONS: Medical evaluation by an Ear, Nose and Throat physician due to asymmetry of hearing. Referring back to Dr. Soldatova.   Pending medical clearance, use of binaural amplification to assist in daily listening situations. A list of area hearing aid providers was given to the patient. Use of communication strategies to improve communication in difficult listening situations. Use of hearing protection in noisy situations.  Audiogram scanned in under media tab.  Burgess Caroline, Au.D., CCC-A Audiologist 02/28/2024  cc: [name], MD, [name], MD.

## 2024-02-29 ENCOUNTER — Other Ambulatory Visit: Payer: Self-pay | Admitting: Family Medicine

## 2024-02-29 NOTE — Telephone Encounter (Unsigned)
 Copied from CRM [id number]. Topic: Clinical - Medication Refill >> Feb 29, 2024  9:26 AM Alica Antu wrote: Medication: estradiol (ESTRACE) 0.1 MG/GM vaginal cream  Has the patient contacted their pharmacy? Yes (Agent: If no, request that the patient contact the pharmacy for the refill. If patient does not wish to contact the pharmacy document the reason why and proceed with request.) (Agent: If yes, when and what did the pharmacy advise?)  This is the patient's preferred pharmacy:  [id number] - [place], [place] - [address] [facility] [place] [address] Phone: [phone] Fax: [phone]  Is this the correct pharmacy for this prescription? Yes If no, delete pharmacy and type the correct one.   Has the prescription been filled recently? No  Is the patient out of the medication? Yes  Has the patient been seen for an appointment in the last year OR does the patient have an upcoming appointment? Yes  Can we respond through MyChart? No  Agent: Please be advised that Rx refills may take up to 3 business days. We ask that you follow-up with your pharmacy.

## 2024-03-01 NOTE — Telephone Encounter (Signed)
 Requested medications are due for refill today.  unsure  Requested medications are on the active medications list.  yes  Last refill. 06/10/2022   Future visit scheduled.   no  Notes to clinic.  Medication is historical.    Requested Prescriptions  Pending Prescriptions Disp Refills   estradiol (ESTRACE) 0.1 MG/GM vaginal cream 42.5 g     Sig: Place 1 Applicatorful vaginally every other day. Apply a pea-sized (1/2 inch) amount every other day     OB/GYN:  Estrogens Failed - 03/01/2024  5:15 PM      Failed - Valid encounter within last 12 months    Recent Outpatient Visits           1 month ago Postmenopausal estrogen deficiency   [place] [facility] Pickard, Cisco Crest, MD   2 months ago Globus sensation   [place] [facility] [name], MD   3 months ago Dysphagia, unspecified type   [place] [facility] [name], MD   5 months ago RUQ pain   [place] [facility] [name], MD   6 months ago Benign essential HTN   [place] [facility] Jenelle Mis, FNP              Passed - Mammogram is up-to-date per Health Maintenance      Passed - Last BP in normal range    BP Readings from Last 1 Encounters:  01/05/24 126/72

## 2024-03-07 MED ORDER — ESTRADIOL 0.1 MG/GM VA CREA: 1.0000 | TOPICAL_CREAM | VAGINAL | 5 refills | Status: AC

## 2024-03-18 ENCOUNTER — Other Ambulatory Visit: Payer: Self-pay

## 2024-03-18 ENCOUNTER — Other Ambulatory Visit: Payer: Self-pay | Admitting: Family Medicine

## 2024-03-18 ENCOUNTER — Telehealth: Payer: Self-pay | Admitting: Family Medicine

## 2024-03-18 MED ORDER — PANTOPRAZOLE SODIUM 40 MG PO TBEC: 40.0000 mg | DELAYED_RELEASE_TABLET | Freq: Every day | ORAL | 0 refills | Status: DC

## 2024-03-18 NOTE — Telephone Encounter (Signed)
 Prescription Request  03/18/2024  LOV: 01/05/2024  What is the name of the medication or equipment? pantoprazole  (PROTONIX ) 40 MG tablet   Have you contacted your pharmacy to request a refill? Yes   Which pharmacy would you like this sent to?  [id number] - [place], [place] - [address] [place]  [address] Phone: [phone] Fax: [phone]    Patient notified that their request is being sent to the clinical staff for review and that they should receive a response within 2 business days.   Please advise at [facility] [phone]

## 2024-03-18 NOTE — Telephone Encounter (Signed)
 Sent in medication

## 2024-03-21 ENCOUNTER — Other Ambulatory Visit: Payer: Self-pay | Admitting: Family Medicine

## 2024-03-25 ENCOUNTER — Other Ambulatory Visit (INDEPENDENT_AMBULATORY_CARE_PROVIDER_SITE_OTHER)

## 2024-03-25 ENCOUNTER — Encounter: Payer: Self-pay | Admitting: Gastroenterology

## 2024-03-25 ENCOUNTER — Ambulatory Visit: Admitting: Gastroenterology

## 2024-03-25 VITALS — BP 130/80 | HR 63 | Ht 62.0 in | Wt 156.0 lb

## 2024-03-25 DIAGNOSIS — R490 Dysphonia: Secondary | ICD-10-CM

## 2024-03-25 DIAGNOSIS — K219 Gastro-esophageal reflux disease without esophagitis: Secondary | ICD-10-CM

## 2024-03-25 DIAGNOSIS — R09A2 Foreign body sensation, throat: Secondary | ICD-10-CM

## 2024-03-25 DIAGNOSIS — R1319 Other dysphagia: Secondary | ICD-10-CM

## 2024-03-25 DIAGNOSIS — K5909 Other constipation: Secondary | ICD-10-CM

## 2024-03-25 LAB — CBC WITH DIFFERENTIAL/PLATELET
Basophils Absolute: 0.1 K/uL (ref 0.0–0.1)
Basophils Relative: 0.6 % (ref 0.0–3.0)
Eosinophils Absolute: 0.7 K/uL (ref 0.0–0.7)
Eosinophils Relative: 5.9 % — ABNORMAL HIGH (ref 0.0–5.0)
HCT: 40 % (ref 36.0–46.0)
Hemoglobin: 13.2 g/dL (ref 12.0–15.0)
Lymphocytes Relative: 38.3 % (ref 12.0–46.0)
Lymphs Abs: 4.8 K/uL — ABNORMAL HIGH (ref 0.7–4.0)
MCHC: 33 g/dL (ref 30.0–36.0)
MCV: 92.8 fl (ref 78.0–100.0)
Monocytes Absolute: 0.9 K/uL (ref 0.1–1.0)
Monocytes Relative: 7.5 % (ref 3.0–12.0)
Neutro Abs: 5.9 K/uL (ref 1.4–7.7)
Neutrophils Relative %: 47.7 % (ref 43.0–77.0)
Platelets: 361 K/uL (ref 150.0–400.0)
RBC: 4.31 Mil/uL (ref 3.87–5.11)
RDW: 13.5 % (ref 11.5–15.5)
WBC: 12.5 K/uL — ABNORMAL HIGH (ref 4.0–10.5)

## 2024-03-25 NOTE — Progress Notes (Signed)
 [name] [id number] [birth year]   Chief Complaint: GERD, throat discomfort  Referring Provider: Duanne Butler DASEN, MD Primary GI MD: Dr. Wilhelmenia  HPI: [name] is a 63 y.o. female with past medical history of anxiety/depression, arthritis, asthma, skin cancer, COPD, EtOH abuse, GERD, hemorrhoids, kidney stones, HTN, MRSA carrier, osteopenia, PTSD, CVA 1990 who presents today for a complaint of throat discomfort and dysphagia.    Patient last seen in office 07/24/2020 by Dr. Wilhelmenia for chronic abdominal pain and constipation with rectal bleeding.  She was started on Bentyl  as needed up to 4 times daily.  Upper and lower endoscopy were recommended at that time but not completed, appears patient canceled scheduled procedure.  Cologuard negative 01/27/2022.  Labs 01/05/2024: Elevated cholesterol, mildly elevated WBC at 10.9, normal hemoglobin, normal CMP  Seen by ENT 02/12/2024, had laryngoscopy with evidence of GERD/LPR. Referred for EGD to evaluate for Barrett's. No neck mass noted on exam at that time.   Patient states that for the last 6 months she has had a sensation of something being in her throat all the time.  Has also developed vocal hoarseness and throat clearing/coughing.  She is currently taking 80 mg of Protonix  and 40 mg Pepcid  daily.  On medication she denies any heartburn or sensation of acid in her throat, but does still have persistent globus sensation as well as regurgitation of food if she does not eat very small meals.  She does sometimes have a feeling of food going down slowly or sitting on her stomach.  She tries to avoid eating close to bedtime and sleeps with the head of her bed elevated.  Sometimes she will wake up with pain on the left side of her face and neck.  Recently has noticed a change in appearance of her neck and feels that the left side looks swollen compared to the right side.  She states that the swelling is associated with some  soreness to touch and this was not noticeable when she was seen by ENT.  She has a follow-up with them planned for September.  Patient denies any nausea, vomiting, fever, chills.  She denies any trouble swallowing liquids and does not report any symptoms of aspiration.  She has COPD but denies any worsening shortness of breath or chest pain.  She is taking Linzess  for treatment of her constipation and has had good success with this medication.  States that she is having daily bowel movements and denies any blood in her stool, melena, or diarrhea.  Denies prior EGD.  She denies smoking but states that she drinks wine most days.  Previous GI Procedures/Imaging   Colonoscopy 10/31/2012 - Mucosa of the colon appeared unremarkable except for the last 15 cm where there were some nodular areas with scattered erosions.  Biopsies were obtained as well as pictures for documentation.   - Retroflexed view of the rectum showed prominent internal hemorrhoids.  Question of IBD.  Started on Canasa  suppositories. Path: Surgical [P], rectum, biopsy - FINDINGS CONSISTENT WITH MUCOSAL PROLAPSE. - NO FEATURES OF INFLAMMATORY BOWEL DISEASE, ADENOMATOUS CHANGE OR MALIGNANCY.  Past Medical History:  Diagnosis Date   Anxiety    Aortic atherosclerosis (HCC)    Arthritis    Asthma    Bed bug bite    Blood transfusion without reported diagnosis    CA - skin cancer    Chronic pain due to injury RIGHT HAND/WRIST IN 2007   Complication of anesthesia    unable to  urinate after surgery   COPD (chronic obstructive pulmonary disease) (HCC)    Stage 1   Crush injury of hand RIGHT IN 2007   X 6 SURG'S  LAST ONE BEING TOTAL WRIST FUSION W/ BONE GRAFT AND PLATE   Cubital tunnel syndrome    Left elbow   Depression    ETOH abuse    GERD (gastroesophageal reflux disease)    05/11/17- not recently   Hemorrhoids    History of kidney stones    passed   Hypertension    MRSA (methicillin resistant Staphylococcus  aureus) carrier    Neuromuscular disorder (HCC)    some fibromyalgia   Neuropathy    Osteopenia 07/2019   T score -1.9 FRAX 8.2% / 1.2%   PTSD (post-traumatic stress disorder) 2007 RIGHT HAND CRUSH INJURY W/ 2 FINGER AMPUTATION   Stroke (HCC) X2 CVA'S  IN 1990   LOSS OF VISION RIGHT Peripherial bilteral   Substance abuse (HCC)    hx: narcotic use   UC (ulcerative colitis) (HCC)    Weakness of right leg CAUSED BY RETRIEVAL THIGH MUSCLE FOR FLAP RIGHT HAND INJURY-- 2006   RIGHT LEG GIVES OUT OCCASIONALLY    Past Surgical History:  Procedure Laterality Date   AMPUTATION Left 05/13/2017   Procedure: Left small finger revision amputation ;  Surgeon: Camella Fallow, MD;  Location: [facility];  Service: Orthopedics;  Laterality: Left;  60 mins   CARPAL TUNNEL RELEASE Left 12/07/2020   Procedure: LEFT CARPAL TUNNEL RELEASE;  Surgeon: Barbarann Oneil BROCKS, MD;  Location: [facility];  Service: Orthopedics;  Laterality: Left;   COLON SURGERY     15 years ago- fistula on colon removed   COSMETIC SURGERY     right leg and abdomen   DILATATION & CURETTAGE/HYSTEROSCOPY WITH MYOSURE N/A 06/14/2016   Procedure: DILATATION & CURETTAGE/HYSTEROSCOPY WITH MYOSURE;  Surgeon: Evalene SHAUNNA Organ, MD;  Location: [facility];  Service: Gynecology;  Laterality: N/A;   HAND CARPECTOMY  12/24/2005   RIGHT-- INCLUDING  REMOVAL  WRIST WIRES AND I &D (EXICIOSNAL)  FOR OSTEOMYELITIS   HYSTEROSCOPY WITH D & C  08/18/2011   Procedure: DILATATION AND CURETTAGE (D&C) /HYSTEROSCOPY;  Surgeon: Evalene SHAUNNA Organ, MD;  Location: [facility];  Service: Gynecology;  Laterality: N/A;   INCISION / DRAINAGE HAND / FINGER  X3  (2006 & 2007)   RIGHT CRUSH WOUND   LESION REMOVAL  08/18/2011   Procedure: EXCISION VAGINAL LESION;  Surgeon: Evalene SHAUNNA Organ, MD;  Location: [facility];  Service: Gynecology;  Laterality: N/A;   LIPOMA EXCISION N/A 07/18/2023   Procedure: EXCISION LIPOMA, BACK;   Surgeon: Evonnie Dorothyann LABOR, DO;  Location: [facility];  Service: General;  Laterality: N/A;   ORIF CARPAL BONE FRACTURE  08/31/2005   I & D OF RIGHT HAND CRUSH INJURY/ OPEN FX'S AND INDEX FINGER RAY RESECTION/ COMPLICATED CLOSURE   ORIF RADIAL FRACTURE  09/04/2005   DISTAL RADIAL PLATE AND PARTIAL THUMB AMPUTATION   RIGHT TOTAL WRIST FUSION W/ BONE GRAFT AND PLATE  91/69/7992   AND FLAP USING RIGHT GOIN MUSCLE   skin cancer removal  2007   throat area   TUBAL LIGATION  15 YRS AGO    Current Outpatient Medications  Medication Sig Dispense Refill   ADVAIR  HFA 230-21 MCG/ACT inhaler INHALE 2 PUFFS INTO THE LUNGS 2 (TWO) TIMES DAILY. (Patient not taking: Reported on 12/05/2023) 12 each 0   albuterol  (VENTOLIN  HFA) 108 (90 Base) MCG/ACT  inhaler TAKE 2 PUFFS BY MOUTH EVERY 6 HOURS AS NEEDED FOR WHEEZE OR SHORTNESS OF BREATH 8.5 each 0   atorvastatin  (LIPITOR) 20 MG tablet Take 1 tablet (20 mg total) by mouth daily. 90 tablet 3   budesonide -formoterol  (SYMBICORT ) 80-4.5 MCG/ACT inhaler Inhale 2 puffs into the lungs in the morning and at bedtime. 1 each 11   EPINEPHrine  0.3 mg/0.3 mL IJ SOAJ injection Inject into the muscle.     estradiol  (ESTRACE ) 0.1 MG/GM vaginal cream Place 1 Applicatorful vaginally every other day. Apply a pea-sized (1/2 inch) amount every other day 42.5 g 5   famotidine  (PEPCID ) 20 MG tablet TAKE 1 TABLET BY MOUTH EVERYDAY AT BEDTIME 90 tablet 1   hydrochlorothiazide  (HYDRODIURIL ) 12.5 MG tablet TAKE 1 TABLET BY MOUTH EVERY DAY 90 tablet 1   hydrOXYzine  (VISTARIL ) 25 MG capsule Take 1 capsule (25 mg total) by mouth every 8 (eight) hours as needed for anxiety or itching. 30 capsule 0   linaclotide  (LINZESS ) 145 MCG CAPS capsule Take 1 capsule (145 mcg total) by mouth daily before breakfast. 90 capsule 0   pantoprazole  (PROTONIX ) 40 MG tablet TAKE 1 TABLET (40 MG TOTAL) BY MOUTH DAILY. TAKING 2 IN THE MORNING. 90 tablet 0   Spacer/Aero-Holding Chambers (AEROCHAMBER MV) inhaler  Use as instructed 1 each 0   No current facility-administered medications for this visit.    Allergies as of 03/25/2024 - Review Complete 02/12/2024  Allergen Reaction Noted   Aspirin Shortness Of Breath 08/16/2011   Cashew nut oil Anaphylaxis 06/29/2023   Breztri aerosphere [budeson-glycopyrrol-formoterol ] Other (See Comments) 05/19/2021   Codeine  Other (See Comments)    Vicodin [hydrocodone -acetaminophen ] Other (See Comments) 05/11/2017    Family History  Problem Relation Age of Onset   Lupus Mother        died at 69   Breast cancer Maternal Aunt 35   Breast cancer Maternal Grandmother 54   Stomach cancer Paternal Grandfather    Colon cancer Neg Hx    Rectal cancer Neg Hx    Esophageal cancer Neg Hx    Inflammatory bowel disease Neg Hx    Liver disease Neg Hx    Pancreatic cancer Neg Hx     Social History   Tobacco Use   Smoking status: Former    Current packs/day: 0.00    Average packs/day: 1.5 packs/day for 28.0 years (42.0 ttl pk-yrs)    Types: Cigarettes    Start date: 11/29/1973    Quit date: 11/29/2001    Years since quitting: 22.3   Smokeless tobacco: Never  Vaping Use   Vaping status: Never Used  Substance Use Topics   Alcohol use: Yes    Comment: wine daily   Drug use: Not Currently    Types: Marijuana    Comment: last smoked 12-08-20     Review of Systems:    Constitutional: No weight loss, fever, chills, weakness or fatigue Eyes: No change in vision Ears, Nose, Throat:  No change in hearing or congestion Skin: No rash or itching Cardiovascular: No chest pain, chest pressure or palpitations   Respiratory: No SOB  Gastrointestinal: See HPI and otherwise negative Genitourinary: No dysuria or change in urinary frequency Neurological: No headache, dizziness or syncope Musculoskeletal: No new muscle or joint pain Hematologic: No bleeding or bruising    Physical Exam:  Vital signs: BP 130/80   Pulse 63   Ht 5' 2 (1.575 m)   Wt 156 lb (70.8 kg)    BMI 28.53 kg/m  Wt Readings from Last 3 Encounters:  03/25/24 156 lb (70.8 kg)  01/05/24 154 lb (69.9 kg)  12/05/23 156 lb (70.8 kg)     Constitutional: NAD, Well developed, Well nourished, alert and cooperative Head:  Normocephalic and atraumatic.  Neck: There is soreness to palpation over the left lateral neck with no lymphadenopathy or masses palpated. Eyes: No scleral icterus. Conjunctiva pink. Mouth: No oral lesions. Respiratory: Respirations even and unlabored. Lungs clear to auscultation bilaterally.  No wheezes, crackles, or rhonchi.  Cardiovascular:  Regular rate and rhythm. No murmurs. No peripheral edema. Gastrointestinal:  Soft, nondistended, nontender. No rebound or guarding. Normal bowel sounds. No appreciable masses or hepatomegaly. Rectal:  Not performed.  Neurologic:  Alert and oriented x4;  grossly normal neurologically.  Skin:   Dry and intact without significant lesions or rashes. Psychiatric: Oriented to person, place and time. Demonstrates good judgement and reason without abnormal affect or behaviors.   RELEVANT LABS AND IMAGING: CBC    Component Value Date/Time   WBC 10.9 (H) 01/05/2024 1147   RBC 4.25 01/05/2024 1147   HGB 13.0 01/05/2024 1147   HCT 39.3 01/05/2024 1147   PLT 338 01/05/2024 1147   MCV 92.5 01/05/2024 1147   MCH 30.6 01/05/2024 1147   MCHC 33.1 01/05/2024 1147   RDW 12.5 01/05/2024 1147   LYMPHSABS 3,277 02/09/2023 0955   MONOABS 0.8 06/23/2021 1446   EOSABS 654 (H) 01/05/2024 1147   BASOSABS 76 01/05/2024 1147    CMP     Component Value Date/Time   NA 140 01/05/2024 1147   K 3.8 01/05/2024 1147   CL 103 01/05/2024 1147   CO2 27 01/05/2024 1147   GLUCOSE 86 01/05/2024 1147   BUN 13 01/05/2024 1147   CREATININE 0.84 01/05/2024 1147   CALCIUM  9.9 01/05/2024 1147   PROT 6.8 01/05/2024 1147   ALBUMIN 4.5 07/27/2018 1109   AST 21 01/05/2024 1147   ALT 20 01/05/2024 1147   ALKPHOS 51 07/27/2018 1109   BILITOT 0.5  01/05/2024 1147   GFRNONAA >60 12/04/2020 1130   GFRNONAA 73 11/18/2020 0913   GFRAA 85 11/18/2020 0913     Assessment/Plan:   GERD Dysphagia Globus sensation  Throat discomfort Vocal hoarseness Neck pain Patient reports about 6 months of persistent throat discomfort and globus sensation, regurgitation of food if she does not eat small meals, and onset of vocal hoarseness and throat clearing.  She was evaluated by ENT with findings on laryngoscopy suggestive of GERD/LPR, and referred to us  for endoscopy due to risk of Barrett's.  She has not had a prior EGD.  Over the last month or so has noticed a soreness of her left lateral neck and feels that it looks swollen in comparison to the right side of her neck.  She has some soreness to palpation in this area but I do not palpate any masses or lymphadenopathy.  She has woken up with soreness after a night of sleep in this area and I wonder if this could be muscular.  Advised her to discuss this at follow-up with ENT. If symptoms worsen or persist could consider imaging of the neck.  - Schedule EGD. I thoroughly discussed the procedure with the patient to include nature of the procedure, alternatives, benefits, and risks (including but not limited to bleeding, infection, perforation, anesthesia/cardiac/pulmonary complications). Patient verbalized understanding and gave verbal consent to proceed with procedure.  - Order barium swallow  - Repeat CBC - Continue 80 mg Protonix  daily - Continue  40 mg Pepcid  daily  Chronic constipation Patient with history of chronic constipation, now well-controlled on Linzess  145 mcg daily.  Having daily bowel movements and denies any diarrhea, blood in stool, melena.  - Continue Linzess   Screening for colon cancer Last colonoscopy in 2014 with finding of internal hemorrhoids and nodular areas with erosions in the rectum which was biopsied and negative for IBD.  No polyps.  She had a negative Cologuard  01/2022.  - Repeat Cologuard due 2026   Camie Furbish, PA-C [facility] 03/25/2024, 11:18 AM  Patient Care Team: Duanne Butler DASEN, MD as PCP - General (Family Medicine) [facility], [place]

## 2024-03-25 NOTE — Patient Instructions (Signed)
 Continue present medications.  Your provider has requested that you go to the basement level for lab work before leaving today. Press B on the elevator. The lab is located at the first door on the left as you exit the elevator.  Due to recent changes in healthcare laws, you may see the results of your imaging and laboratory studies on MyChart before your provider has had a chance to review them.  We understand that in some cases there may be results that are confusing or concerning to you. Not all laboratory results come back in the same time frame and the provider may be waiting for multiple results in order to interpret others.  Please give us  48 hours in order for your provider to thoroughly review all the results before contacting the office for clarification of your results.    You have been scheduled for a Barium Esophogram at [facility] (1st floor of the hospital) on 04/10/24 at 9:00 AM. Please arrive 30 minutes prior to your appointment for registration. If you need to reschedule for any reason, please contact radiology at [phone] to do so. __________________________________________________________________ A barium swallow is an examination that concentrates on views of the esophagus. This tends to be a double contrast exam (barium and two liquids which, when combined, create a gas to distend the wall of the oesophagus) or single contrast (non-ionic iodine  based). The study is usually tailored to your symptoms so a good history is essential. Attention is paid during the study to the form, structure and configuration of the esophagus, looking for functional disorders (such as aspiration, dysphagia, achalasia, motility and reflux) EXAMINATION You may be asked to change into a gown, depending on the type of swallow being performed. A radiologist and radiographer will perform the procedure. The radiologist will advise you of the type of contrast selected for your procedure and direct  you during the exam. You will be asked to stand, sit or lie in several different positions and to hold a small amount of fluid in your mouth before being asked to swallow while the imaging is performed .In some instances you may be asked to swallow barium coated marshmallows to assess the motility of a solid food bolus. The exam can be recorded as a digital or video fluoroscopy procedure. POST PROCEDURE It will take 1-2 days for the barium to pass through your system. To facilitate this, it is important, unless otherwise directed, to increase your fluids for the next 24-48hrs and to resume your normal diet.  This test typically takes about 30 minutes to perform. __________________________________________________________________________________  [facility] have been scheduled for an endoscopy. Please follow written instructions given to you at your visit today.  If you use inhalers (even only as needed), please bring them with you on the day of your procedure.  If you take any of the following medications, they will need to be adjusted prior to your procedure:   DO NOT TAKE 7 DAYS PRIOR TO TEST- Trulicity (dulaglutide) Ozempic, Wegovy (semaglutide) Mounjaro (tirzepatide) Bydureon Bcise (exanatide extended release)  DO NOT TAKE 1 DAY PRIOR TO YOUR TEST Rybelsus (semaglutide) Adlyxin (lixisenatide) Victoza (liraglutide) Byetta (exanatide) ___________________________________________________________________________  Thank you for trusting me with your gastrointestinal care!   Camie Furbish, PA-C  _______________________________________________________  If your blood pressure at your visit was 140/90 or greater, please contact your primary care physician to follow up on this.  _______________________________________________________  If you are age 92 or older, your body mass index should be between 23-30. Your  Body mass index is 28.53 kg/m. If this is out of the aforementioned range listed,  please consider follow up with your Primary Care Provider.  If you are age 52 or younger, your body mass index should be between 19-25. Your Body mass index is 28.53 kg/m. If this is out of the aformentioned range listed, please consider follow up with your Primary Care Provider.   ________________________________________________________  The [facility] providers would like to encourage you to use MYCHART to communicate with providers for non-urgent requests or questions.  Due to long hold times on the telephone, sending your provider a message by [facility] may be a faster and more efficient way to get a response.  Please allow 48 business hours for a response.  Please remember that this is for non-urgent requests.  _______________________________________________________

## 2024-03-26 ENCOUNTER — Ambulatory Visit: Payer: Self-pay | Admitting: Gastroenterology

## 2024-03-26 NOTE — Progress Notes (Signed)
 Attending Physician's Attestation   I have reviewed the chart.   I agree with the Advanced Practitioner's note, impression, and recommendations with any updates as below.    Corliss Parish, MD [facility] Advanced Endoscopy Office # [phone]

## 2024-04-10 ENCOUNTER — Ambulatory Visit (HOSPITAL_COMMUNITY)
Admission: RE | Admit: 2024-04-10 | Discharge: 2024-04-10 | Disposition: A | Discharge status: Home / Self Care | Source: Ambulatory Visit | Attending: Gastroenterology | Admitting: Gastroenterology

## 2024-04-10 DIAGNOSIS — K5909 Other constipation: Secondary | ICD-10-CM | POA: Insufficient documentation

## 2024-04-10 DIAGNOSIS — R490 Dysphonia: Secondary | ICD-10-CM | POA: Diagnosis not present

## 2024-04-10 DIAGNOSIS — R1319 Other dysphagia: Secondary | ICD-10-CM | POA: Diagnosis not present

## 2024-04-10 DIAGNOSIS — R09A2 Foreign body sensation, throat: Secondary | ICD-10-CM | POA: Diagnosis not present

## 2024-04-10 DIAGNOSIS — K219 Gastro-esophageal reflux disease without esophagitis: Secondary | ICD-10-CM | POA: Diagnosis not present

## 2024-04-10 DIAGNOSIS — R1310 Dysphagia, unspecified: Secondary | ICD-10-CM | POA: Diagnosis not present

## 2024-04-11 ENCOUNTER — Other Ambulatory Visit: Payer: Self-pay | Admitting: Family Medicine

## 2024-04-16 ENCOUNTER — Encounter: Payer: Self-pay | Admitting: Gastroenterology

## 2024-04-16 ENCOUNTER — Emergency Department (HOSPITAL_COMMUNITY)

## 2024-04-16 ENCOUNTER — Emergency Department (HOSPITAL_COMMUNITY)
Admission: EM | Admit: 2024-04-16 | Discharge: 2024-04-16 | Disposition: A | Discharge status: Home / Self Care | Attending: Emergency Medicine | Admitting: Emergency Medicine

## 2024-04-16 ENCOUNTER — Encounter (HOSPITAL_COMMUNITY): Payer: Self-pay | Admitting: *Deleted

## 2024-04-16 ENCOUNTER — Other Ambulatory Visit: Payer: Self-pay

## 2024-04-16 DIAGNOSIS — R200 Anesthesia of skin: Secondary | ICD-10-CM

## 2024-04-16 DIAGNOSIS — I10 Essential (primary) hypertension: Secondary | ICD-10-CM | POA: Insufficient documentation

## 2024-04-16 DIAGNOSIS — Z8673 Personal history of transient ischemic attack (TIA), and cerebral infarction without residual deficits: Secondary | ICD-10-CM | POA: Diagnosis not present

## 2024-04-16 DIAGNOSIS — I6782 Cerebral ischemia: Secondary | ICD-10-CM | POA: Diagnosis not present

## 2024-04-16 DIAGNOSIS — M4802 Spinal stenosis, cervical region: Secondary | ICD-10-CM | POA: Diagnosis not present

## 2024-04-16 DIAGNOSIS — J449 Chronic obstructive pulmonary disease, unspecified: Secondary | ICD-10-CM | POA: Insufficient documentation

## 2024-04-16 DIAGNOSIS — Z85828 Personal history of other malignant neoplasm of skin: Secondary | ICD-10-CM | POA: Diagnosis not present

## 2024-04-16 DIAGNOSIS — R202 Paresthesia of skin: Secondary | ICD-10-CM | POA: Diagnosis not present

## 2024-04-16 DIAGNOSIS — M4712 Other spondylosis with myelopathy, cervical region: Secondary | ICD-10-CM | POA: Diagnosis not present

## 2024-04-16 LAB — BASIC METABOLIC PANEL WITH GFR
Anion gap: 10 (ref 5–15)
BUN: 14 mg/dL (ref 8–23)
CO2: 25 mmol/L (ref 22–32)
Calcium: 9.7 mg/dL (ref 8.9–10.3)
Chloride: 103 mmol/L (ref 98–111)
Creatinine, Ser: 0.84 mg/dL (ref 0.44–1.00)
GFR, Estimated: >60 mL/min (ref >60)
Glucose, Bld: 96 mg/dL (ref 70–99)
Potassium: 3.8 mmol/L (ref 3.5–5.1)
Sodium: 138 mmol/L (ref 135–145)

## 2024-04-16 LAB — CBC WITH DIFFERENTIAL/PLATELET
Abs Immature Granulocytes: 0.03 K/uL (ref 0.00–0.07)
Basophils Absolute: 0.1 K/uL (ref 0.0–0.1)
Basophils Relative: 1 %
Eosinophils Absolute: 0.5 K/uL (ref 0.0–0.5)
Eosinophils Relative: 6 %
HCT: 41 % (ref 36.0–46.0)
Hemoglobin: 13.2 g/dL (ref 12.0–15.0)
Immature Granulocytes: 0 %
Lymphocytes Relative: 35 %
Lymphs Abs: 3.2 K/uL (ref 0.7–4.0)
MCH: 31.1 pg (ref 26.0–34.0)
MCHC: 32.2 g/dL (ref 30.0–36.0)
MCV: 96.5 fL (ref 80.0–100.0)
Monocytes Absolute: 0.9 K/uL (ref 0.1–1.0)
Monocytes Relative: 10 %
Neutro Abs: 4.4 K/uL (ref 1.7–7.7)
Neutrophils Relative %: 48 %
Platelets: 327 K/uL (ref 150–400)
RBC: 4.25 MIL/uL (ref 3.87–5.11)
RDW: 12.4 % (ref 11.5–15.5)
WBC: 9 K/uL (ref 4.0–10.5)
nRBC: 0 % (ref 0.0–0.2)

## 2024-04-16 LAB — MAGNESIUM: Magnesium: 2.2 mg/dL (ref 1.7–2.4)

## 2024-04-16 MED ORDER — PREDNISONE 20 MG PO TABS
60.0000 mg | ORAL_TABLET | Freq: Once | ORAL | Status: AC
  Administered 2024-04-16: 60 mg via ORAL
  Filled 2024-04-16: qty 3

## 2024-04-16 MED ORDER — PREDNISONE 20 MG PO TABS: 40.0000 mg | ORAL_TABLET | Freq: Every day | ORAL | 0 refills | Status: AC

## 2024-04-16 NOTE — ED Provider Notes (Signed)
 Emergency Department Provider Note   I have reviewed the triage vital signs and the nursing notes.   HISTORY  Chief Complaint Arm Pain   HPI [name] is a 63 y.o. female presents to the emergency department for evaluation of worsening neck pain and new left arm numbness.  Numbness began today.  No weakness in the arm.  No falls or injuries.  No fever.  She has had a mild headache but no acute onset, maximal intensity headache symptoms.  She has some phantom pain in the right hand which is chronic.  No leg symptoms.   Past Medical History:  Diagnosis Date   Anal fissure    Anxiety    Aortic atherosclerosis (HCC)    Arthritis    Asthma    Bed bug bite    Blood transfusion without reported diagnosis    CA - skin cancer    Chronic pain due to injury RIGHT HAND/WRIST IN 2007   Complication of anesthesia    unable to urinate after surgery   COPD (chronic obstructive pulmonary disease) (HCC)    Stage 1   Crush injury of hand RIGHT IN 2007   X 6 SURG'S  LAST ONE BEING TOTAL WRIST FUSION W/ BONE GRAFT AND PLATE   Cubital tunnel syndrome    Left elbow   Depression    ETOH abuse    GERD (gastroesophageal reflux disease)    05/11/17- not recently   Hemorrhoids    History of kidney stones    passed   Hyperlipidemia    Hypertension    MRSA (methicillin resistant Staphylococcus aureus) carrier    Neuromuscular disorder (HCC)    some fibromyalgia   Neuropathy    Osteopenia 07/2019   T score -1.9 FRAX 8.2% / 1.2%   PTSD (post-traumatic stress disorder) 2007 RIGHT HAND CRUSH INJURY W/ 2 FINGER AMPUTATION   Stroke (HCC) X2 CVA'S  IN 1990   LOSS OF VISION RIGHT Peripherial bilteral   Substance abuse (HCC)    hx: narcotic use   UC (ulcerative colitis) (HCC)    Weakness of right leg CAUSED BY RETRIEVAL THIGH MUSCLE FOR FLAP RIGHT HAND INJURY-- 2006   RIGHT LEG GIVES OUT OCCASIONALLY    Review of Systems  Constitutional: No fever/chills Cardiovascular: Denies chest  pain. Respiratory: Denies shortness of breath. Gastrointestinal: No abdominal pain.  Musculoskeletal: Negative for back pain. Skin: Negative for rash. Neurological: Negative for weakness. Mild HA and left arm numbness.    ____________________________________________   PHYSICAL EXAM:  VITAL SIGNS: ED Triage Vitals  Encounter Vitals Group     BP 04/16/24 1043 (!) 175/95     Pulse Rate 04/16/24 1043 64     Resp 04/16/24 1043 18     Temp 04/16/24 1043 97.9 F (36.6 C)     Temp Source 04/16/24 1043 Oral     SpO2 04/16/24 1043 98 %     Weight 04/16/24 1047 156 lb (70.8 kg)   Constitutional: Alert and oriented. Well appearing and in no acute distress. Eyes: Conjunctivae are normal.  Head: Atraumatic. Nose: No congestion/rhinnorhea. Mouth/Throat: Mucous membranes are moist.   Neck: No stridor.   Cardiovascular: Normal rate, regular rhythm. Good peripheral circulation. Grossly normal heart sounds.   Respiratory: Normal respiratory effort.  No retractions. Lungs CTAB. Gastrointestinal: Soft and nontender. No distention.  Musculoskeletal: No gross deformities of extremities. Neurologic:  Normal speech and language.  5/5 grip strength on bilateral upper extremities.  Decree sensation to the left  upper extremity from the mid humerus to the hand circumferentially.  Skin:  Skin is warm, dry and intact. No rash noted.  ____________________________________________   LABS (all labs ordered are listed, but only abnormal results are displayed)  Labs Reviewed  BASIC METABOLIC PANEL WITH GFR  CBC WITH DIFFERENTIAL/PLATELET  MAGNESIUM   ____________________________________________  RADIOLOGY  No results found.  ____________________________________________   PROCEDURES  Procedure(s) performed:   Procedures   ____________________________________________   INITIAL IMPRESSION / ASSESSMENT AND PLAN / ED COURSE  Pertinent labs & imaging results that were available during my  care of the patient were reviewed by me and considered in my medical decision making (see chart for details).   This patient is Presenting for Evaluation of arm numbness, which does require a range of treatment options, and is a complaint that involves a high risk of morbidity and mortality.  The Differential Diagnoses include cervical radiculopathy, central cord compression, epidural abscess, c spine fracture, etc.  Critical Interventions-    Medications - No data to display  Reassessment after intervention:     I did obtain Additional Historical Information from family at bedside.   Clinical Laboratory Tests Ordered, included CBC with no leukocytosis or anemia.  No acute kidney injury.  Magnesium normal.  Radiologic Tests Ordered, included MRI brain and c spine. I independently interpreted the images and agree with radiology interpretation.   Medical Decision Making: Summary:  Patient presents to the emergency department with left arm numbness new this morning.  Weakness.  She is outside of TNK window.  She is also having significant neck pain raising doubt that this is related to stroke.  Suspect cervical radiculopathy.  Plan for MRI brain and MRI C-spine along with screening blood work to rule out electrolyte disturbance.  Reevaluation with update and discussion with   ***Considered admission***  Patient's presentation is most consistent with acute presentation with potential threat to life or bodily function.   Disposition:   ____________________________________________  FINAL CLINICAL IMPRESSION(S) / ED DIAGNOSES  Final diagnoses:  None     NEW OUTPATIENT MEDICATIONS STARTED DURING THIS VISIT:  New Prescriptions   No medications on file    Note:  This document was prepared using Dragon voice recognition software and may include unintentional dictation errors.  Fonda Law, MD, [facility] Emergency [facility]

## 2024-04-16 NOTE — ED Triage Notes (Addendum)
 BIB family from home for L arm pain, also endorses bilateral neck and shoulder pain. Sx constant, and fluctuates. No relief with ibuprofen. Sx ongoing for 6 months, worse today. Mentions chronic GI issues. Recent endoscopy. Alert, NAD, calm, interactive. Denies sx other than pain. Reports sx started on L, now bilateral equally.

## 2024-04-16 NOTE — Discharge Instructions (Signed)
 Your left arm numbness seems to be caused by a pinched nerve in your neck.  I am starting you on a course of steroids for the next 5 days.  Please call the neurosurgery office to schedule a close follow-up appointment.  Return with any worsening numbness, weakness, difficulty breathing, or other severe symptoms.

## 2024-04-22 DIAGNOSIS — M4722 Other spondylosis with radiculopathy, cervical region: Secondary | ICD-10-CM | POA: Diagnosis not present

## 2024-04-23 ENCOUNTER — Ambulatory Visit: Admitting: Gastroenterology

## 2024-04-23 ENCOUNTER — Encounter: Payer: Self-pay | Admitting: Gastroenterology

## 2024-04-23 VITALS — BP 181/85 | HR 60 | Temp 97.7°F | Resp 15 | Ht 62.0 in | Wt 156.0 lb

## 2024-04-23 DIAGNOSIS — K449 Diaphragmatic hernia without obstruction or gangrene: Secondary | ICD-10-CM

## 2024-04-23 DIAGNOSIS — J449 Chronic obstructive pulmonary disease, unspecified: Secondary | ICD-10-CM | POA: Diagnosis not present

## 2024-04-23 DIAGNOSIS — K209 Esophagitis, unspecified without bleeding: Secondary | ICD-10-CM | POA: Diagnosis not present

## 2024-04-23 DIAGNOSIS — K2100 Gastro-esophageal reflux disease with esophagitis, without bleeding: Secondary | ICD-10-CM | POA: Diagnosis not present

## 2024-04-23 DIAGNOSIS — R09A2 Foreign body sensation, throat: Secondary | ICD-10-CM

## 2024-04-23 DIAGNOSIS — J45909 Unspecified asthma, uncomplicated: Secondary | ICD-10-CM | POA: Diagnosis not present

## 2024-04-23 DIAGNOSIS — K219 Gastro-esophageal reflux disease without esophagitis: Secondary | ICD-10-CM

## 2024-04-23 DIAGNOSIS — R1319 Other dysphagia: Secondary | ICD-10-CM

## 2024-04-23 DIAGNOSIS — R1310 Dysphagia, unspecified: Secondary | ICD-10-CM | POA: Diagnosis not present

## 2024-04-23 DIAGNOSIS — I10 Essential (primary) hypertension: Secondary | ICD-10-CM | POA: Diagnosis not present

## 2024-04-23 DIAGNOSIS — R490 Dysphonia: Secondary | ICD-10-CM

## 2024-04-23 DIAGNOSIS — E785 Hyperlipidemia, unspecified: Secondary | ICD-10-CM | POA: Diagnosis not present

## 2024-04-23 MED ORDER — SODIUM CHLORIDE 0.9 % IV SOLN: 500.0000 mL | Freq: Once | INTRAVENOUS | Status: DC

## 2024-04-23 NOTE — Op Note (Signed)
 [facility] Patient Name: [name] Procedure Date: 04/23/2024 1:10 PM MRN: [id number] Endoscopist: Aloha Finner , MD, [id number] Age: 63 Referring MD:  Date of Birth: 22-Nov-1960 Gender: Female Account #: [id number] Procedure:                Upper GI endoscopy Indications:              Dysphagia, Gastro-esophageal reflux disease, Globus                            sensation Medicines:                Monitored Anesthesia Care Procedure:                Pre-Anesthesia Assessment:                           - Prior to the procedure, a History and Physical                            was performed, and patient medications and                            allergies were reviewed. The patient's tolerance of                            previous anesthesia was also reviewed. The risks                            and benefits of the procedure and the sedation                            options and risks were discussed with the patient.                            All questions were answered, and informed consent                            was obtained. Prior Anticoagulants: The patient has                            taken no anticoagulant or antiplatelet agents. ASA                            Grade Assessment: II - A patient with mild systemic                            disease. After reviewing the risks and benefits,                            the patient was deemed in satisfactory condition to                            undergo the procedure.  After obtaining informed consent, the endoscope was                            passed under direct vision. Throughout the                            procedure, the patient's blood pressure, pulse, and                            oxygen saturations were monitored continuously. The                            Olympus scope [id number] was introduced through the                            mouth, and advanced to the second  part of duodenum.                            The upper GI endoscopy was accomplished without                            difficulty. The patient tolerated the procedure. Scope In: Scope Out: Findings:                 One tongue of salmon-colored mucosa was present                            from 34 to 35 cm in the very distal esophagus. No                            other visible abnormalities were present. Biopsies                            were taken with a cold forceps for histology to                            rule in/out Barrett's.                           No other gross lesions were noted in the entire                            esophagus. Biopsies were taken with a cold forceps                            for histology to rule out EOE/LOE. After the rest                            of the EGD was completed, a guidewire was placed                            and the scope was withdrawn. Dilation was performed  with a Savary dilator with mild resistance at 18                            mm. The dilation site was examined following                            endoscope reinsertion and showed no change.                           The Z-line was irregular and was found 35 cm from                            the incisors.                           A 3 cm hiatal hernia was present.                           Patchy mildly erythematous mucosa without bleeding                            was found in the entire examined stomach. Biopsies                            were taken with a cold forceps for histology and                            Helicobacter pylori testing.                           No gross lesions were noted in the duodenal bulb,                            in the first portion of the duodenum and in the                            second portion of the duodenum. Complications:            No immediate complications. Estimated Blood Loss:     Estimated blood  loss was minimal. Impression:               - Salmon-colored mucosal tongue suspicious for                            short-segment Barrett's esophagus found distally.                            Biopsied.                           - No other gross lesions in the entire esophagus.                            Biopsied. Dilated to 18 mm savory.                           -  Z-line irregular, 35 cm from the incisors.                           - 3 cm hiatal hernia.                           - Erythematous mucosa in the stomach. Biopsied.                           - No gross lesions in the duodenal bulb, in the                            first portion of the duodenum and in the second                            portion of the duodenum. Recommendation:           - The patient will be observed post-procedure,                            until all discharge criteria are met.                           - Discharge patient to home.                           - Patient has a contact number available for                            emergencies. The signs and symptoms of potential                            delayed complications were discussed with the                            patient. Return to normal activities tomorrow.                            Written discharge instructions were provided to the                            patient.                           - Dilation diet as per protocol.                           - Continue present medications.                           - Await pathology results.                           - Repeat upper endoscopy for surveillance based on  pathology results.                           - If further symptoms globus recommendations,                            persist, or add an additional started available                            although risk required would consider SLP eval/                           - The findings and recommendations were  discussed                            with the patient.                           - The findings and recommendations were discussed                            with the patient's family. Aloha Finner, MD 04/23/2024 2:41:25 PM

## 2024-04-23 NOTE — Patient Instructions (Signed)
 Educational handout provided to patient related to POST DILATION DIET, HIATAL HERNIA  POST DILATION DIET  Continue present medications  Awaiting pathology results  PLEASE CALL BACK AND LET OFFICE KNOW WHO YOUR ENT IS PER DR. MANSOURATY    YOU HAD AN ENDOSCOPIC PROCEDURE TODAY AT THE [facility]:   Refer to the procedure report that was given to you for any specific questions about what was found during the examination.  If the procedure report does not answer your questions, please call your gastroenterologist to clarify.  If you requested that your care partner not be given the details of your procedure findings, then the procedure report has been included in a sealed envelope for you to review at your convenience later.  YOU SHOULD EXPECT: Some feelings of bloating in the abdomen. Passage of more gas than usual.  Walking can help get rid of the air that was put into your GI tract during the procedure and reduce the bloating. If you had a lower endoscopy (such as a colonoscopy or flexible sigmoidoscopy) you may notice spotting of blood in your stool or on the toilet paper. If you underwent a bowel prep for your procedure, you may not have a normal bowel movement for a few days.  Please Note:  You might notice some irritation and congestion in your nose or some drainage.  This is from the oxygen used during your procedure.  There is no need for concern and it should clear up in a day or so.  SYMPTOMS TO REPORT IMMEDIATELY:  Following upper endoscopy (EGD)  Vomiting of blood or coffee ground material  New chest pain or pain under the shoulder blades  Painful or persistently difficult swallowing  New shortness of breath  Fever of 100F or higher  Black, tarry-looking stools  For urgent or emergent issues, a gastroenterologist can be reached at any hour by calling [phone]. Do not use MyChart messaging for urgent concerns.    DIET:  We do recommend a small meal at  first, but then you may proceed to your regular diet.  Drink plenty of fluids but you should avoid alcoholic beverages for 24 hours.  ACTIVITY:  You should plan to take it easy for the rest of today and you should NOT DRIVE or use heavy machinery until tomorrow (because of the sedation medicines used during the test).    FOLLOW UP: Our staff will call the number listed on your records the next business day following your procedure.  We will call around 7:15- 8:00 am to check on you and address any questions or concerns that you may have regarding the information given to you following your procedure. If we do not reach you, we will leave a message.     If any biopsies were taken you will be contacted by phone or by letter within the next 1-3 weeks.  Please call us  at [phone] if you have not heard about the biopsies in 3 weeks.    SIGNATURES/CONFIDENTIALITY: You and/or your care partner have signed paperwork which will be entered into your electronic medical record.  These signatures attest to the fact that that the information above on your After Visit Summary has been reviewed and is understood.  Full responsibility of the confidentiality of this discharge information lies with you and/or your care-partner.

## 2024-04-23 NOTE — Progress Notes (Signed)
 A/o x 3, VSS, gd SR's, pleased with anesthesia, report to RN

## 2024-04-23 NOTE — Progress Notes (Signed)
 Updated medical record w/ pt, pt had Left arm numbness last week and went to ED, has nerve pinched in her neck

## 2024-04-23 NOTE — Progress Notes (Signed)
 GASTROENTEROLOGY PROCEDURE H&P NOTE   Primary Care Physician: Duanne Butler DASEN, MD  HPI: [name] is a 63 y.o. female who presents for EGD for evaluation of GERD/Globus/Dysphagia and Barrett's screening.    Past Medical History:  Diagnosis Date   Anal fissure    Anxiety    Aortic atherosclerosis (HCC)    Arthritis    Asthma    Bed bug bite    Blood transfusion without reported diagnosis    CA - skin cancer    Chronic pain due to injury RIGHT HAND/WRIST IN 2007   Complication of anesthesia    unable to urinate after surgery   COPD (chronic obstructive pulmonary disease) (HCC)    Stage 1   Crush injury of hand RIGHT IN 2007   X 6 SURG'S  LAST ONE BEING TOTAL WRIST FUSION W/ BONE GRAFT AND PLATE   Cubital tunnel syndrome    Left elbow   Depression    ETOH abuse    GERD (gastroesophageal reflux disease)    05/11/17- not recently   Hemorrhoids    History of kidney stones    passed   Hyperlipidemia    Hypertension    MRSA (methicillin resistant Staphylococcus aureus) carrier    Neuromuscular disorder (HCC)    some fibromyalgia   Neuropathy    Osteopenia 07/2019   T score -1.9 FRAX 8.2% / 1.2%   PTSD (post-traumatic stress disorder) 2007 RIGHT HAND CRUSH INJURY W/ 2 FINGER AMPUTATION   Stroke (HCC) X2 CVA'S  IN 1990   LOSS OF VISION RIGHT Peripherial bilteral   Substance abuse (HCC)    hx: narcotic use   UC (ulcerative colitis) (HCC)    Weakness of right leg CAUSED BY RETRIEVAL THIGH MUSCLE FOR FLAP RIGHT HAND INJURY-- 2006   RIGHT LEG GIVES OUT OCCASIONALLY   Past Surgical History:  Procedure Laterality Date   AMPUTATION Left 05/13/2017   Procedure: Left small finger revision amputation ;  Surgeon: Camella Fallow, MD;  Location: [facility];  Service: Orthopedics;  Laterality: Left;  60 mins   CARPAL TUNNEL RELEASE Left 12/07/2020   Procedure: LEFT CARPAL TUNNEL RELEASE;  Surgeon: Barbarann Oneil BROCKS, MD;  Location: [facility];  Service: Orthopedics;   Laterality: Left;   COLON SURGERY     15 years ago- fistula on colon removed   COSMETIC SURGERY     right leg and abdomen   DILATATION & CURETTAGE/HYSTEROSCOPY WITH MYOSURE N/A 06/14/2016   Procedure: DILATATION & CURETTAGE/HYSTEROSCOPY WITH MYOSURE;  Surgeon: Evalene SHAUNNA Organ, MD;  Location: [facility];  Service: Gynecology;  Laterality: N/A;   HAND CARPECTOMY  12/24/2005   RIGHT-- INCLUDING  REMOVAL  WRIST WIRES AND I &D (EXICIOSNAL)  FOR OSTEOMYELITIS   HYSTEROSCOPY WITH D & C  08/18/2011   Procedure: DILATATION AND CURETTAGE (D&C) /HYSTEROSCOPY;  Surgeon: Evalene SHAUNNA Organ, MD;  Location: [facility];  Service: Gynecology;  Laterality: N/A;   INCISION / DRAINAGE HAND / FINGER  X3  (2006 & 2007)   RIGHT CRUSH WOUND   LESION REMOVAL  08/18/2011   Procedure: EXCISION VAGINAL LESION;  Surgeon: Evalene SHAUNNA Organ, MD;  Location: [facility];  Service: Gynecology;  Laterality: N/A;   LIPOMA EXCISION N/A 07/18/2023   Procedure: EXCISION LIPOMA, BACK;  Surgeon: Evonnie Dorothyann DELENA, DO;  Location: [facility];  Service: General;  Laterality: N/A;   ORIF CARPAL BONE FRACTURE  08/31/2005   I & D OF RIGHT HAND CRUSH INJURY/  OPEN FX'S AND INDEX FINGER RAY RESECTION/ COMPLICATED CLOSURE   ORIF RADIAL FRACTURE  09/04/2005   DISTAL RADIAL PLATE AND PARTIAL THUMB AMPUTATION   RIGHT TOTAL WRIST FUSION W/ BONE GRAFT AND PLATE  91/69/7992   AND FLAP USING RIGHT GOIN MUSCLE   skin cancer removal  2007   throat area   TUBAL LIGATION  15 YRS AGO   Current Outpatient Medications  Medication Sig Dispense Refill   albuterol  (VENTOLIN  HFA) 108 (90 Base) MCG/ACT inhaler TAKE 2 PUFFS BY MOUTH EVERY 6 HOURS AS NEEDED FOR WHEEZE OR SHORTNESS OF BREATH 8.5 each 0   atorvastatin  (LIPITOR) 20 MG tablet Take 1 tablet (20 mg total) by mouth daily. 90 tablet 3   budesonide -formoterol  (SYMBICORT ) 80-4.5 MCG/ACT inhaler Inhale 2 puffs into the lungs in the morning and at bedtime. 1 each 11    EPINEPHrine  0.3 mg/0.3 mL IJ SOAJ injection Inject into the muscle.     estradiol  (ESTRACE ) 0.1 MG/GM vaginal cream Place 1 Applicatorful vaginally every other day. Apply a pea-sized (1/2 inch) amount every other day 42.5 g 5   famotidine  (PEPCID ) 20 MG tablet TAKE 1 TABLET BY MOUTH EVERYDAY AT BEDTIME (Patient taking differently: Take 40 mg by mouth at bedtime.) 90 tablet 1   hydrochlorothiazide  (HYDRODIURIL ) 12.5 MG tablet TAKE 1 TABLET BY MOUTH EVERY DAY 90 tablet 1   linaclotide  (LINZESS ) 145 MCG CAPS capsule Take 1 capsule (145 mcg total) by mouth daily before breakfast. 90 capsule 0   pantoprazole  (PROTONIX ) 40 MG tablet TAKE 1 TABLET (40 MG TOTAL) BY MOUTH DAILY. TAKING 2 IN THE MORNING. 180 tablet 1   Spacer/Aero-Holding Chambers (AEROCHAMBER MV) inhaler Use as instructed 1 each 0   No current facility-administered medications for this visit.    Current Outpatient Medications:    albuterol  (VENTOLIN  HFA) 108 (90 Base) MCG/ACT inhaler, TAKE 2 PUFFS BY MOUTH EVERY 6 HOURS AS NEEDED FOR WHEEZE OR SHORTNESS OF BREATH, Disp: 8.5 each, Rfl: 0   atorvastatin  (LIPITOR) 20 MG tablet, Take 1 tablet (20 mg total) by mouth daily., Disp: 90 tablet, Rfl: 3   budesonide -formoterol  (SYMBICORT ) 80-4.5 MCG/ACT inhaler, Inhale 2 puffs into the lungs in the morning and at bedtime., Disp: 1 each, Rfl: 11   EPINEPHrine  0.3 mg/0.3 mL IJ SOAJ injection, Inject into the muscle., Disp: , Rfl:    estradiol  (ESTRACE ) 0.1 MG/GM vaginal cream, Place 1 Applicatorful vaginally every other day. Apply a pea-sized (1/2 inch) amount every other day, Disp: 42.5 g, Rfl: 5   famotidine  (PEPCID ) 20 MG tablet, TAKE 1 TABLET BY MOUTH EVERYDAY AT BEDTIME (Patient taking differently: Take 40 mg by mouth at bedtime.), Disp: 90 tablet, Rfl: 1   hydrochlorothiazide  (HYDRODIURIL ) 12.5 MG tablet, TAKE 1 TABLET BY MOUTH EVERY DAY, Disp: 90 tablet, Rfl: 1   linaclotide  (LINZESS ) 145 MCG CAPS capsule, Take 1 capsule (145 mcg total) by mouth  daily before breakfast., Disp: 90 capsule, Rfl: 0   pantoprazole  (PROTONIX ) 40 MG tablet, TAKE 1 TABLET (40 MG TOTAL) BY MOUTH DAILY. TAKING 2 IN THE MORNING., Disp: 180 tablet, Rfl: 1   Spacer/Aero-Holding Chambers (AEROCHAMBER MV) inhaler, Use as instructed, Disp: 1 each, Rfl: 0 Allergies  Allergen Reactions   Aspirin Shortness Of Breath    ASTHMATIC ATTACK  Other Reaction(s): Not available, Not available, Not available   Cashew Nut Oil Anaphylaxis    Cashew Allergy   Breztri Aerosphere [Budeson-Glycopyrrol-Formoterol ] Other (See Comments)    Urinary retention   Codeine  Other (See Comments)  ABNORMAL BEHAVIOR  Increased in bad moods  NO CODEINE  BASED MEDICATIONS    Vicodin [Hydrocodone -Acetaminophen ] Other (See Comments)    Extreme anxiety   Family History  Problem Relation Age of Onset   Lupus Mother        died at 21   Breast cancer Maternal Grandmother 72   Stomach cancer Paternal Grandfather    Breast cancer Maternal Aunt 35   Colon cancer Neg Hx    Rectal cancer Neg Hx    Esophageal cancer Neg Hx    Inflammatory bowel disease Neg Hx    Liver disease Neg Hx    Pancreatic cancer Neg Hx    Social History   Socioeconomic History   Marital status: Married    Spouse name: Not on file   Number of children: 6   Years of education: Not on file   Highest education level: Not on file  Occupational History   Occupation: disablity  Tobacco Use   Smoking status: Former    Current packs/day: 0.00    Average packs/day: 1.5 packs/day for 28.0 years (42.0 ttl pk-yrs)    Types: Cigarettes    Start date: 11/29/1973    Quit date: 11/29/2001    Years since quitting: 22.4   Smokeless tobacco: Never  Vaping Use   Vaping status: Never Used  Substance and Sexual Activity   Alcohol use: Yes    Comment: wine most days   Drug use: Not Currently    Types: Marijuana    Comment: last smoked 12-08-20   Sexual activity: Yes    Partners: Male    Birth control/protection:  Post-menopausal  Other Topics Concern   Not on file  Social History Narrative   Lives with husband.     Social Drivers of Corporate investment banker Strain: Low Risk  (11/10/2022)   Overall Financial Resource Strain (CARDIA)    Difficulty of Paying Living Expenses: Not very hard  Food Insecurity: No Food Insecurity (11/10/2022)   Hunger Vital Sign    Worried About Running Out of Food in the Last Year: Never true    Ran Out of Food in the Last Year: Never true  Transportation Needs: No Transportation Needs (11/10/2022)   PRAPARE - Administrator, Civil Service (Medical): No    Lack of Transportation (Non-Medical): No  Physical Activity: Insufficiently Active (11/10/2022)   Exercise Vital Sign    Days of Exercise per Week: 3 days    Minutes of Exercise per Session: 30 min  Stress: No Stress Concern Present (11/10/2022)   [employer] of Occupational Health - Occupational Stress Questionnaire    Feeling of Stress : Not at all  Social Connections: Moderately Isolated (11/10/2022)   Social Connection and Isolation Panel    Frequency of Communication with Friends and Family: More than three times a week    Frequency of Social Gatherings with Friends and Family: Once a week    Attends Religious Services: Never    Database administrator or Organizations: No    Attends Banker Meetings: Never    Marital Status: Married  Catering manager Violence: Not At Risk (11/10/2022)   Humiliation, Afraid, Rape, and Kick questionnaire    Fear of Current or Ex-Partner: No    Emotionally Abused: No    Physically Abused: No    Sexually Abused: No    Physical Exam: There were no vitals filed for this visit. There is no height or weight on file to  calculate BMI. GEN: NAD EYE: Sclerae anicteric ENT: MMM CV: Non-tachycardic GI: Soft, NT/ND NEURO:  Alert & Oriented x 3  Lab Results: No results for input(s): WBC, HGB, HCT, PLT in the last 72 hours. BMET No  results for input(s): NA, K, CL, CO2, GLUCOSE, BUN, CREATININE, CALCIUM  in the last 72 hours. LFT No results for input(s): PROT, ALBUMIN, AST, ALT, ALKPHOS, BILITOT, BILIDIR, IBILI in the last 72 hours. PT/INR No results for input(s): LABPROT, INR in the last 72 hours.   Impression / Plan: This is a 63 y.o.female who presents for EGD for evaluation of GERD/Globus/Dysphagia and Barrett's screening.    The risks and benefits of endoscopic evaluation/treatment were discussed with the patient and/or family; these include but are not limited to the risk of perforation, infection, bleeding, missed lesions, lack of diagnosis, severe illness requiring hospitalization, as well as anesthesia and sedation related illnesses.  The patient's history has been reviewed, patient examined, no change in status, and deemed stable for procedure.  The patient and/or family is agreeable to proceed.    Aloha Finner, MD [facility] Office # [phone]

## 2024-04-24 ENCOUNTER — Telehealth: Payer: Self-pay

## 2024-04-24 NOTE — Telephone Encounter (Signed)
  Follow up Call-     04/23/2024    1:30 PM  Call back number  Post procedure Call Back phone  # [phone]  Permission to leave phone message Yes     Patient questions:  Do you have a fever, pain , or abdominal swelling? No. Pain Score  0 *  Have you tolerated food without any problems? Yes.    Have you been able to return to your normal activities? Yes.    Do you have any questions about your discharge instructions: Diet   No. Medications  No. Follow up visit  No.  Do you have questions or concerns about your Care? No.  Actions: * If pain score is 4 or above: No action needed, pain <4.

## 2024-04-26 LAB — SURGICAL PATHOLOGY

## 2024-04-27 ENCOUNTER — Ambulatory Visit: Payer: Self-pay | Admitting: Gastroenterology

## 2024-05-15 ENCOUNTER — Encounter (INDEPENDENT_AMBULATORY_CARE_PROVIDER_SITE_OTHER): Payer: Self-pay | Admitting: Otolaryngology

## 2024-05-15 ENCOUNTER — Ambulatory Visit (INDEPENDENT_AMBULATORY_CARE_PROVIDER_SITE_OTHER): Admitting: Otolaryngology

## 2024-05-15 VITALS — BP 130/74 | HR 72

## 2024-05-15 DIAGNOSIS — K219 Gastro-esophageal reflux disease without esophagitis: Secondary | ICD-10-CM

## 2024-05-15 DIAGNOSIS — R09A2 Foreign body sensation, throat: Secondary | ICD-10-CM

## 2024-05-15 DIAGNOSIS — H903 Sensorineural hearing loss, bilateral: Secondary | ICD-10-CM | POA: Diagnosis not present

## 2024-05-15 DIAGNOSIS — R09A0 Foreign body sensation, unspecified: Secondary | ICD-10-CM | POA: Diagnosis not present

## 2024-05-15 DIAGNOSIS — H9313 Tinnitus, bilateral: Secondary | ICD-10-CM

## 2024-05-15 DIAGNOSIS — R0981 Nasal congestion: Secondary | ICD-10-CM

## 2024-05-15 DIAGNOSIS — M5412 Radiculopathy, cervical region: Secondary | ICD-10-CM | POA: Diagnosis not present

## 2024-05-15 DIAGNOSIS — R0982 Postnasal drip: Secondary | ICD-10-CM

## 2024-05-15 NOTE — Progress Notes (Signed)
 ENT Progress Note:   Update 05/15/2024  Discussed the use of AI scribe software for clinical note transcription with the patient, who gave verbal consent to proceed.  History of Present Illness [name] is a 63 year old female who presents with tinnitus and globus sensation.  Audio with evidence of bilateral hearing loss, more pronounced on the right side, she attributes it to noise exposure in her previous work environment. No vertigo or balance issues are present. An MRI conducted in August, not specifically for her hearing, did not reveal any masses at the [facility] area. She has difficulty hearing others and needs to increase the volume on her television.  She has a history of strokes at age 51, resulting in right-sided blindness. She is uncertain if her hearing loss is related to her strokes. She also experiences tinnitus, which she associates with her previous work environment.  She reports a persistent globus sensation, describing it as feeling like something is stuck in her throat. An upper endoscopy did not show Barrett's esophagus but had evidence of reflux inflammation.   Records Reviewed:  Initial Evaluation  Reason for Consult: globus sensation and hx of severe GERD, ringing in her ears  HPI: Discussed the use of AI scribe software for clinical note transcription with the patient, who gave verbal consent to proceed.  History of Present Illness [name] is a 63 year old female with severe acid reflux who presents with a sensation of a lump in her throat.  She experiences a sensation of a lump in her throat, which she associates with her severe acid reflux. Despite managing her reflux with two pills in the morning and two pills at night, she continues to experience symptoms, especially after eating more than a small amount. She has not had difficulty swallowing but feels a 'big lump' in her throat when swallowing.  Her reflux symptoms are severe enough to require her  to sleep with her bed tilted up. She has been on her current morning reflux medication for several years and the evening medication for about a year, with a recent dosage increase. She has made dietary changes to manage her reflux, avoiding fried foods and sugar, which trigger her symptoms. Caffeine, specifically coffee, does not exacerbate her symptoms.  Additionally, she experiences significant ringing in her ears, although no ear pain is present.  She has a history of multiple surgeries following an industrial accident that resulted in right hand injury. She underwent 18 surgeries over two years, including nerve blocks to manage pain. Certain blood pressure and cholesterol medications previously caused severe chest pain, which resolved upon discontinuation.   Records Reviewed:  UC visit 12/29/23 [name] is a 63 y.o. female. Pt reports intermittent episodes of SOB and headaches x2.31months. Now daily is SOB. Reports the SOB is worse in the mornings when she first wakes up. Pt states her symptoms started after her MD increased her advair  inhaler dose but the increased dose irritated her throat so she stopped taking it. Pt reports she is using her rescue albuterol  inhaler 12-14, up to 20 times/day; is not using a spacer. Albuterol  helps a little temporarily Using tylenol  and benadryl for the headaches. Pt also reports she stopped taking bp meds last week because she doesn't feel good when taking them, PCP is unaware. Reports she has follow ups with PCP next week and ENT in June. Primary concern is wheezing/SOB   She was given inhaler Rx for Symbicort     Past Medical  History:  Diagnosis Date   Anal fissure    Anxiety    Aortic atherosclerosis (HCC)    Arthritis    Asthma    Bed bug bite    Blood transfusion without reported diagnosis    CA - skin cancer    Chronic pain due to injury RIGHT HAND/WRIST IN 2007   Complication of anesthesia    unable to urinate after surgery   COPD  (chronic obstructive pulmonary disease) (HCC)    Stage 1   Crush injury of hand RIGHT IN 2007   X 6 SURG'S  LAST ONE BEING TOTAL WRIST FUSION W/ BONE GRAFT AND PLATE   Cubital tunnel syndrome    Left elbow   Depression    ETOH abuse    GERD (gastroesophageal reflux disease)    05/11/17- not recently   Hemorrhoids    History of kidney stones    passed   Hyperlipidemia    Hypertension    MRSA (methicillin resistant Staphylococcus aureus) carrier    Neuromuscular disorder (HCC)    some fibromyalgia   Neuropathy    Osteopenia 07/2019   T score -1.9 FRAX 8.2% / 1.2%   PTSD (post-traumatic stress disorder) 2007 RIGHT HAND CRUSH INJURY W/ 2 FINGER AMPUTATION   Stroke (HCC) X2 CVA'S  IN 1990   LOSS OF VISION RIGHT Peripherial bilteral   Substance abuse (HCC)    hx: narcotic use   UC (ulcerative colitis) (HCC)    Weakness of right leg CAUSED BY RETRIEVAL THIGH MUSCLE FOR FLAP RIGHT HAND INJURY-- 2006   RIGHT LEG GIVES OUT OCCASIONALLY    Past Surgical History:  Procedure Laterality Date   AMPUTATION Left 05/13/2017   Procedure: Left small finger revision amputation ;  Surgeon: Camella Fallow, MD;  Location: [facility];  Service: Orthopedics;  Laterality: Left;  60 mins   CARPAL TUNNEL RELEASE Left 12/07/2020   Procedure: LEFT CARPAL TUNNEL RELEASE;  Surgeon: Barbarann Oneil BROCKS, MD;  Location: [facility];  Service: Orthopedics;  Laterality: Left;   COLON SURGERY     15 years ago- fistula on colon removed   COLONOSCOPY  2014   [place]-normal   COSMETIC SURGERY     right leg and abdomen   DILATATION & CURETTAGE/HYSTEROSCOPY WITH MYOSURE N/A 06/14/2016   Procedure: DILATATION & CURETTAGE/HYSTEROSCOPY WITH MYOSURE;  Surgeon: Evalene SHAUNNA Organ, MD;  Location: [facility];  Service: Gynecology;  Laterality: N/A;   HAND CARPECTOMY  12/24/2005   RIGHT-- INCLUDING  REMOVAL  WRIST WIRES AND I &D (EXICIOSNAL)  FOR OSTEOMYELITIS   HYSTEROSCOPY WITH D & C  08/18/2011   Procedure:  DILATATION AND CURETTAGE (D&C) /HYSTEROSCOPY;  Surgeon: Evalene SHAUNNA Organ, MD;  Location: [facility];  Service: Gynecology;  Laterality: N/A;   INCISION / DRAINAGE HAND / FINGER  X3  (2006 & 2007)   RIGHT CRUSH WOUND   LESION REMOVAL  08/18/2011   Procedure: EXCISION VAGINAL LESION;  Surgeon: Evalene SHAUNNA Organ, MD;  Location: [facility];  Service: Gynecology;  Laterality: N/A;   LIPOMA EXCISION N/A 07/18/2023   Procedure: EXCISION LIPOMA, BACK;  Surgeon: Evonnie Dorothyann LABOR, DO;  Location: [facility];  Service: General;  Laterality: N/A;   ORIF CARPAL BONE FRACTURE  08/31/2005   I & D OF RIGHT HAND CRUSH INJURY/ OPEN FX'S AND INDEX FINGER RAY RESECTION/ COMPLICATED CLOSURE   ORIF RADIAL FRACTURE  09/04/2005   DISTAL RADIAL PLATE AND PARTIAL THUMB AMPUTATION   RIGHT TOTAL WRIST  FUSION W/ BONE GRAFT AND PLATE  91/69/7992   AND FLAP USING RIGHT GOIN MUSCLE   skin cancer removal  2007   throat area   TUBAL LIGATION  15 YRS AGO    Family History  Problem Relation Age of Onset   Lupus Mother        died at 33   Breast cancer Maternal Grandmother 3   Stomach cancer Paternal Grandfather    Breast cancer Maternal Aunt 35   Colon cancer Neg Hx    Rectal cancer Neg Hx    Esophageal cancer Neg Hx    Inflammatory bowel disease Neg Hx    Liver disease Neg Hx    Pancreatic cancer Neg Hx     Social History:  reports that she quit smoking about 22 years ago. Her smoking use included cigarettes. She started smoking about 50 years ago. She has a 42 pack-year smoking history. She has never used smokeless tobacco. She reports current alcohol use. She reports that she does not currently use drugs after having used the following drugs: Marijuana.  Allergies:  Allergies  Allergen Reactions   Aspirin Shortness Of Breath    ASTHMATIC ATTACK  Other Reaction(s): Not available, Not available, Not available   Cashew Nut Oil Anaphylaxis    Cashew Allergy   Codeine   Other (See Comments)    ABNORMAL BEHAVIOR  Increased in bad moods  NO CODEINE  BASED MEDICATIONS    Breztri Aerosphere [Budeson-Glycopyrrol-Formoterol ] Other (See Comments)    Urinary retention   Vicodin [Hydrocodone -Acetaminophen ] Other (See Comments)    Extreme anxiety    Medications: I have reviewed the patient's current medications.  The PMH, PSH, Medications, Allergies, and SH were reviewed and updated.  ROS: Constitutional: Negative for fever, weight loss and weight gain. Cardiovascular: Negative for chest pain and dyspnea on exertion. Respiratory: Is not experiencing shortness of breath at rest. Gastrointestinal: Negative for nausea and vomiting. Neurological: Negative for headaches. Psychiatric: The patient is not nervous/anxious  There were no vitals taken for this visit. There is no height or weight on file to calculate BMI.  PHYSICAL EXAM:  Exam: General: Well-developed, well-nourished Respiratory Respiratory effort: Equal inspiration and expiration without stridor Cardiovascular Peripheral Vascular: Warm extremities with equal color/perfusion Eyes: No nystagmus with equal extraocular motion bilaterally Neuro/Psych/Balance: Patient oriented to person, place, and time; Appropriate mood and affect; Gait is intact with no imbalance; Cranial nerves I-XII are intact Head and Face Inspection: Normocephalic and atraumatic without mass or lesion Palpation: Facial skeleton intact without bony stepoffs Salivary Glands: No mass or tenderness  Facial Strength: Facial motility symmetric and full bilaterally ENT Pinna: External ear intact and fully developed External canal: Canal is patent with intact skin Tympanic Membrane: Clear and mobile External Nose: No scar or anatomic deformity Internal Nose: Septum is relatively straight on anterior rhinoscopy. Bilateral inferior turbinate hypertrophy.  Lips, Teeth, and gums: Mucosa and teeth intact and viable Oral  cavity/oropharynx: No erythema or exudate, no lesions present Neck Neck and Trachea: Midline trachea without mass or lesion Thyroid : No mass or nodularity Lymphatics: No lymphadenopathy   Studies Reviewed: Audiogram Asymmetric SNHL with more severe loss on the right side, affecting mid to high-freq 92% WDS on the right and 100 on the left  EGD 04/23/24 - Salmon-colored mucosal tongue suspicious for                            short-segment Barrett's esophagus found distally.  Biopsied.                           - No other gross lesions in the entire esophagus.                            Biopsied. Dilated to 18 mm savory.                           - Z-line irregular, 35 cm from the incisors.                           - 3 cm hiatal hernia.                           - Erythematous mucosa in the stomach. Biopsied.                           - No gross lesions in the duodenal bulb, in the                            first portion of the duodenum and in the second                            portion of the duodenum. Final path 8/12 FINAL DIAGNOSIS        1. Surgical [P], distal esophagus :       GASTROESOPHAGEAL MUCOSA WITH MILD INFLAMMATION CONSISTENT WITH REFLUX.       NEGATIVE FOR INTESTINAL METAPLASIA AND DYSPLASIA.        2. Surgical [P], esophagus, random sites :       UNREMARKABLE SQUAMOUS MUCOSA.       NEGATIVE FOR EOSINOPHILIC ESOPHAGITIS.        3. Surgical [P], stomach, random sites :       UNREMARKABLE GASTRIC ANTRAL AND OXYNTIC MUCOSA.       NEGATIVE FOR HELICOBACTER PYLORI.   MRI brain 04/16/24 MRI brain:   1. No evidence of an acute intracranial abnormality. 2. Chronic cortically-based infarcts within the left parietal, occipital and temporal lobes (PCA vascular territory). 3. Minor background chronic small vessel ischemic changes within the cerebral white matter. 4. Mild generalized cerebral atrophy. 5. Paranasal sinus disease as  described.  Assessment/Plan: Encounter Diagnoses  Name Primary?   Tinnitus of both ears Yes   Chronic GERD    Globus sensation    Chronic nasal congestion    Post-nasal drip      Assessment and Plan Assessment & Plan Globus sensation and severe reflux sx  Chronic GERD with severe symptoms despite maximum medical therapy. Risk for Barrett's esophagitis due to chronic reflux. We discussed a need for GI referral . Her scope exam today showed evidence of GERD LPR but otherwise was reassuring including flexible scope exam without masses or lesions  - Refer to Gastroenterology for upper endoscopy to evaluate for Barrett's esophagitis. - Provided after-visit summary with information on reflux management and lifestyle modifications. - Recommend reflux supplement available online to be taken postprandially. - Order esophagram to rule out esophageal pathology  GERD LPR - Continue Protonix  40 mg in am and Pepcid  20 mg at night  -  Reflux Gourmet after meals - diet and lifestyle changes to minimize GERD - Refer to Jamie Koufman's blog for dietary and lifestyle modifications/reflux cook book  Bilateral non-pulsatile tinnitus  No ear pain, drainage or vertigo Tinnitus likely related to presbycusis. - Order audiometry to evaluate the extent of hearing loss.  Update 05/15/24 Bilateral asymmetric sensorineural hearing loss with tinnitus worse on the right side.  Bilateral sensorineural hearing loss, more significant on the right, likely age-related.  MRI brain in Aug 2025 without CPA pathology. Tinnitus secondary to hearing loss. No vertigo or balance issues. We discussed hearing rehabilitation and she is interested in hearing aids.  - Refer to [facility] for hearing aid evaluation and fitting.  Gastroesophageal reflux disease with globus sensation  Globus sensation and dysphagia potentially related to reflux changes observed during upper endoscopy. Swallow test ordered but deferred due to  mild symptoms.  - Focus on reflux control to manage globus sensation. - continue Protonix  40 mg daily and 20 mg Pepcid  for now with the plan to wean in the future   Elena Larry, MD Otolaryngology [facility] Phone: [phone] Fax: [phone]    05/15/2024, 1:01 PM

## 2024-05-15 NOTE — Patient Instructions (Signed)

## 2024-05-20 ENCOUNTER — Ambulatory Visit (HOSPITAL_BASED_OUTPATIENT_CLINIC_OR_DEPARTMENT_OTHER)
Admission: RE | Admit: 2024-05-20 | Discharge: 2024-05-20 | Disposition: A | Discharge status: Home / Self Care | Source: Ambulatory Visit | Attending: Family Medicine | Admitting: Family Medicine

## 2024-05-20 DIAGNOSIS — M8589 Other specified disorders of bone density and structure, multiple sites: Secondary | ICD-10-CM | POA: Diagnosis not present

## 2024-05-20 DIAGNOSIS — Z780 Asymptomatic menopausal state: Secondary | ICD-10-CM | POA: Diagnosis not present

## 2024-05-21 ENCOUNTER — Ambulatory Visit: Payer: Self-pay | Admitting: Family Medicine

## 2024-05-23 ENCOUNTER — Other Ambulatory Visit: Payer: Self-pay | Admitting: Family Medicine

## 2024-06-13 DIAGNOSIS — M4722 Other spondylosis with radiculopathy, cervical region: Secondary | ICD-10-CM | POA: Diagnosis not present

## 2024-06-25 DIAGNOSIS — M5412 Radiculopathy, cervical region: Secondary | ICD-10-CM | POA: Diagnosis not present

## 2024-06-27 DIAGNOSIS — M5412 Radiculopathy, cervical region: Secondary | ICD-10-CM | POA: Diagnosis not present

## 2024-07-03 DIAGNOSIS — M5412 Radiculopathy, cervical region: Secondary | ICD-10-CM | POA: Diagnosis not present

## 2024-07-05 DIAGNOSIS — M5412 Radiculopathy, cervical region: Secondary | ICD-10-CM | POA: Diagnosis not present

## 2024-07-10 DIAGNOSIS — M5412 Radiculopathy, cervical region: Secondary | ICD-10-CM | POA: Diagnosis not present

## 2024-07-11 ENCOUNTER — Other Ambulatory Visit: Payer: Self-pay | Admitting: Family Medicine

## 2024-07-11 DIAGNOSIS — Z1231 Encounter for screening mammogram for malignant neoplasm of breast: Secondary | ICD-10-CM

## 2024-07-24 ENCOUNTER — Ambulatory Visit

## 2024-07-24 VITALS — Ht 62.0 in | Wt 156.0 lb

## 2024-07-24 DIAGNOSIS — Z0001 Encounter for general adult medical examination with abnormal findings: Secondary | ICD-10-CM

## 2024-07-24 DIAGNOSIS — J449 Chronic obstructive pulmonary disease, unspecified: Secondary | ICD-10-CM | POA: Diagnosis not present

## 2024-07-24 DIAGNOSIS — Z0000 Encounter for general adult medical examination without abnormal findings: Secondary | ICD-10-CM

## 2024-07-24 MED ORDER — ALBUTEROL SULFATE HFA 108 (90 BASE) MCG/ACT IN AERS: INHALATION_SPRAY | RESPIRATORY_TRACT | 0 refills | Status: AC

## 2024-07-24 NOTE — Progress Notes (Signed)
 Chief Complaint  Patient presents with   Medicare Wellness     Subjective:   [name] is a 63 y.o. female who presents for a Medicare Annual Wellness Visit.  Allergies (verified) Aspirin, Cashew nut oil, Codeine , Breztri aerosphere [budeson-glycopyrrol-formoterol ], and Vicodin [hydrocodone -acetaminophen ]   History: Past Medical History:  Diagnosis Date   Anal fissure    Anxiety    Aortic atherosclerosis    Arthritis    Asthma    Bed bug bite    Blood transfusion without reported diagnosis    CA - skin cancer    Chronic pain due to injury RIGHT HAND/WRIST IN 2007   Complication of anesthesia    unable to urinate after surgery   COPD (chronic obstructive pulmonary disease) (HCC)    Stage 1   Crush injury of hand RIGHT IN 2007   X 6 SURG'S  LAST ONE BEING TOTAL WRIST FUSION W/ BONE GRAFT AND PLATE   Cubital tunnel syndrome    Left elbow   Depression    ETOH abuse    GERD (gastroesophageal reflux disease)    05/11/17- not recently   Hemorrhoids    History of kidney stones    passed   Hyperlipidemia    Hypertension    MRSA (methicillin resistant Staphylococcus aureus) carrier    Neuromuscular disorder (HCC)    some fibromyalgia   Neuropathy    Osteopenia 07/2019   T score -1.9 FRAX 8.2% / 1.2%   PTSD (post-traumatic stress disorder) 2007 RIGHT HAND CRUSH INJURY W/ 2 FINGER AMPUTATION   Stroke (HCC) X2 CVA'S  IN 1990   LOSS OF VISION RIGHT Peripherial bilteral   Substance abuse (HCC)    hx: narcotic use   UC (ulcerative colitis) (HCC)    Weakness of right leg CAUSED BY RETRIEVAL THIGH MUSCLE FOR FLAP RIGHT HAND INJURY-- 2006   RIGHT LEG GIVES OUT OCCASIONALLY   Past Surgical History:  Procedure Laterality Date   AMPUTATION Left 05/13/2017   Procedure: Left small finger revision amputation ;  Surgeon: Camella Fallow, MD;  Location: [facility];  Service: Orthopedics;  Laterality: Left;  60 mins   CARPAL TUNNEL RELEASE Left 12/07/2020   Procedure: LEFT CARPAL  TUNNEL RELEASE;  Surgeon: Barbarann Oneil BROCKS, MD;  Location: [facility];  Service: Orthopedics;  Laterality: Left;   COLON SURGERY     15 years ago- fistula on colon removed   COLONOSCOPY  2014   [place]-normal   COSMETIC SURGERY     right leg and abdomen   DILATATION & CURETTAGE/HYSTEROSCOPY WITH MYOSURE N/A 06/14/2016   Procedure: DILATATION & CURETTAGE/HYSTEROSCOPY WITH MYOSURE;  Surgeon: Evalene SHAUNNA Organ, MD;  Location: [facility];  Service: Gynecology;  Laterality: N/A;   HAND CARPECTOMY  12/24/2005   RIGHT-- INCLUDING  REMOVAL  WRIST WIRES AND I &D (EXICIOSNAL)  FOR OSTEOMYELITIS   HYSTEROSCOPY WITH D & C  08/18/2011   Procedure: DILATATION AND CURETTAGE (D&C) /HYSTEROSCOPY;  Surgeon: Evalene SHAUNNA Organ, MD;  Location: [facility];  Service: Gynecology;  Laterality: N/A;   INCISION / DRAINAGE HAND / FINGER  X3  (2006 & 2007)   RIGHT CRUSH WOUND   LESION REMOVAL  08/18/2011   Procedure: EXCISION VAGINAL LESION;  Surgeon: Evalene SHAUNNA Organ, MD;  Location: [facility];  Service: Gynecology;  Laterality: N/A;   LIPOMA EXCISION N/A 07/18/2023   Procedure: EXCISION LIPOMA, BACK;  Surgeon: Evonnie Craven A, DO;  Location: [facility];  Service: General;  Laterality: N/A;   ORIF CARPAL BONE FRACTURE  08/31/2005   I & D OF RIGHT HAND CRUSH INJURY/ OPEN FX'S AND INDEX FINGER RAY RESECTION/ COMPLICATED CLOSURE   ORIF RADIAL FRACTURE  09/04/2005   DISTAL RADIAL PLATE AND PARTIAL THUMB AMPUTATION   RIGHT TOTAL WRIST FUSION W/ BONE GRAFT AND PLATE  91/69/7992   AND FLAP USING RIGHT GOIN MUSCLE   skin cancer removal  2007   throat area   TUBAL LIGATION  15 YRS AGO   Family History  Problem Relation Age of Onset   Lupus Mother        died at 76   Breast cancer Maternal Grandmother 79   Stomach cancer Paternal Grandfather    Breast cancer Maternal Aunt 35   Colon cancer Neg Hx    Rectal cancer Neg Hx    Esophageal cancer Neg Hx    Inflammatory  bowel disease Neg Hx    Liver disease Neg Hx    Pancreatic cancer Neg Hx    Social History   Occupational History   Occupation: disablity  Tobacco Use   Smoking status: Former    Current packs/day: 0.00    Average packs/day: 1.5 packs/day for 28.0 years (42.0 ttl pk-yrs)    Types: Cigarettes    Start date: 11/29/1973    Quit date: 11/29/2001    Years since quitting: 22.6   Smokeless tobacco: Never  Vaping Use   Vaping status: Never Used  Substance and Sexual Activity   Alcohol use: Yes    Comment: wine most days   Drug use: Not Currently    Types: Marijuana    Comment: last smoked 12-08-20   Sexual activity: Yes    Partners: Male    Birth control/protection: Post-menopausal   Tobacco Counseling Counseling given: Not Answered  SDOH Screenings   Food Insecurity: No Food Insecurity (07/24/2024)  Housing: Low Risk  (07/24/2024)  Transportation Needs: No Transportation Needs (07/24/2024)  Utilities: Not At Risk (07/24/2024)  Alcohol Screen: Low Risk  (11/10/2022)  Depression (PHQ2-9): Low Risk  (07/24/2024)  Financial Resource Strain: Low Risk  (11/10/2022)  Physical Activity: Insufficiently Active (07/24/2024)  Social Connections: Moderately Isolated (07/24/2024)  Stress: No Stress Concern Present (07/24/2024)  Tobacco Use: Medium Risk (07/24/2024)  Health Literacy: Adequate Health Literacy (07/24/2024)   See flowsheets for full screening details  Depression Screen PHQ 2 & 9 Depression Scale- Over the past 2 weeks, how often have you been bothered by any of the following problems? Little interest or pleasure in doing things: 0 Feeling down, depressed, or hopeless (PHQ Adolescent also includes...irritable): 0 PHQ-2 Total Score: 0 Trouble falling or staying asleep, or sleeping too much: 1 Feeling tired or having little energy: 1 Poor appetite or overeating (PHQ Adolescent also includes...weight loss): 0 Feeling bad about yourself - or that you are a failure or have let  yourself or your family down: 0 Trouble concentrating on things, such as reading the newspaper or watching television (PHQ Adolescent also includes...like school work): 0 Moving or speaking so slowly that other people could have noticed. Or the opposite - being so fidgety or restless that you have been moving around a lot more than usual: 0 Thoughts that you would be better off dead, or of hurting yourself in some way: 0 PHQ-9 Total Score: 2 If you checked off any problems, how difficult have these problems made it for you to do your work, take care of things at home, or get along with other people?:  Not difficult at all     Goals Addressed             This Visit's Progress    Maintain health and independence   On track      Visit info / Clinical Intake: Medicare Wellness Visit Type:: Subsequent Annual Wellness Visit Persons participating in visit:: patient Medicare Wellness Visit Mode:: Telephone If telephone:: video declined Because this visit was a virtual/telehealth visit:: vitals recorded from last visit If Telephone or Video please confirm:: I connected with the patient using audio enabled telemedicine application and verified that I am speaking with the correct person using two identifiers; I discussed the limitations of evaluation and management by telemedicine; The patient expressed understanding and agreed to proceed Patient Location:: home Provider Location:: office Information given by:: patient Interpreter Needed?: No Pre-visit prep was completed: yes AWV questionnaire completed by patient prior to visit?: no Living arrangements:: lives with spouse/significant other Patient's Overall Health Status Rating: good Typical amount of pain: some Does pain affect daily life?: no Are you currently prescribed opioids?: no  Dietary Habits and Nutritional Risks How many meals a day?: 3 Eats fruit and vegetables daily?: yes Most meals are obtained by: preparing own  meals Diabetic:: no  Functional Status Activities of Daily Living (to include ambulation/medication): Independent Ambulation: Independent Medication Administration: Independent Home Management: Independent Manage your own finances?: yes Primary transportation is: driving Concerns about vision?: no *vision screening is required for WTM* Concerns about hearing?: no  Fall Screening Falls in the past year?: 0 Number of falls in past year: 0 Was there an injury with Fall?: 0 Fall Risk Category Calculator: 0 Patient Fall Risk Level: Low Fall Risk  Fall Risk Patient at Risk for Falls Due to: Impaired mobility Fall risk Follow up: Falls evaluation completed; Education provided; Falls prevention discussed  Home and Transportation Safety: All rugs have non-skid backing?: yes All stairs or steps have railings?: yes Grab bars in the bathtub or shower?: yes Have non-skid surface in bathtub or shower?: yes Good home lighting?: yes Regular seat belt use?: yes Hospital stays in the last year:: no  Cognitive Assessment Difficulty concentrating, remembering, or making decisions? : no Will 6CIT or Mini Cog be Completed: no 6CIT or Mini Cog Declined: patient alert, oriented, able to answer questions appropriately and recall recent events  Advance Directives (For Healthcare) Does Patient Have a Medical Advance Directive?: No Does patient want to make changes to medical advance directive?: Yes (MAU/Ambulatory/Procedural Areas - Information given)  Reviewed/Updated  Reviewed/Updated: Reviewed All (Medical, Surgical, Family, Medications, Allergies, Care Teams, Patient Goals)        Objective:    Today's Vitals   07/24/24 1359  Weight: 156 lb (70.8 kg)  Height: 5' 2 (1.575 m)   Body mass index is 28.53 kg/m.  Current Medications (verified) Outpatient Encounter Medications as of 07/24/2024  Medication Sig   atorvastatin  (LIPITOR) 20 MG tablet Take 1 tablet (20 mg total) by mouth  daily.   budesonide -formoterol  (SYMBICORT ) 80-4.5 MCG/ACT inhaler Inhale 2 puffs into the lungs in the morning and at bedtime.   EPINEPHrine  0.3 mg/0.3 mL IJ SOAJ injection Inject into the muscle.   estradiol  (ESTRACE ) 0.1 MG/GM vaginal cream Place 1 Applicatorful vaginally every other day. Apply a pea-sized (1/2 inch) amount every other day   famotidine  (PEPCID ) 20 MG tablet TAKE 1 TABLET BY MOUTH EVERYDAY AT BEDTIME (Patient taking differently: Take 40 mg by mouth at bedtime.)   hydrochlorothiazide  (HYDRODIURIL ) 12.5 MG tablet TAKE 1  TABLET BY MOUTH EVERY DAY   LINZESS  145 MCG CAPS capsule TAKE 1 CAPSULE BY MOUTH DAILY BEFORE BREAKFAST.   pantoprazole  (PROTONIX ) 40 MG tablet TAKE 1 TABLET (40 MG TOTAL) BY MOUTH DAILY. TAKING 2 IN THE MORNING.   Spacer/Aero-Holding Chambers (AEROCHAMBER MV) inhaler Use as instructed   [DISCONTINUED] albuterol  (VENTOLIN  HFA) 108 (90 Base) MCG/ACT inhaler TAKE 2 PUFFS BY MOUTH EVERY 6 HOURS AS NEEDED FOR WHEEZE OR SHORTNESS OF BREATH   albuterol  (VENTOLIN  HFA) 108 (90 Base) MCG/ACT inhaler TAKE 2 PUFFS BY MOUTH EVERY 6 HOURS AS NEEDED FOR WHEEZE OR SHORTNESS OF BREATH   No facility-administered encounter medications on file as of 07/24/2024.   Hearing/Vision screen Hearing Screening - Comments:: Patient is able to hear conversational tones without difficulty. No issues reported.   Vision Screening - Comments:: Wears rx glasses - up to date with routine eye exams with [facility]  Immunizations and Health Maintenance Health Maintenance  Topic Date Due   Pneumococcal Vaccine: 50+ Years (3 of 3 - PCV20 or PCV21) 09/29/2022   COVID-19 Vaccine (2 - 2025-26 season) 05/13/2024   Fecal DNA (Cologuard)  01/27/2025   Medicare Annual Wellness (AWV)  07/24/2025   Cervical Cancer Screening (HPV/Pap Cotest)  07/30/2025   Mammogram  08/03/2025   DTaP/Tdap/Td (7 - Td or Tdap) 11/27/2032   Influenza Vaccine  Completed   DEXA SCAN  Completed   Hepatitis C Screening   Completed   HIV Screening  Completed   Zoster Vaccines- Shingrix  Completed   Hepatitis B Vaccines 19-59 Average Risk  Aged Out   Meningococcal B Vaccine  Aged Out   Colonoscopy  Discontinued        Assessment/Plan:  This is a routine wellness examination for Damary.  Patient Care Team: Duanne Butler DASEN, MD as PCP - General (Family Medicine) Okey Burns, MD as Consulting Physician (Otolaryngology) [facility] Arletta Camie FORBES DEVONNA (Gastroenterology) [place], [facility] (Neurosurgery)  I have personally reviewed and noted the following in the patient's chart:   Medical and social history Use of alcohol, tobacco or illicit drugs  Current medications and supplements including opioid prescriptions. Functional ability and status Nutritional status Physical activity Advanced directives List of other physicians Hospitalizations, surgeries, and ER visits in previous 12 months Vitals Screenings to include cognitive, depression, and falls Referrals and appointments  No orders of the defined types were placed in this encounter.  In addition, I have reviewed and discussed with patient certain preventive protocols, quality metrics, and best practice recommendations. A written personalized care plan for preventive services as well as general preventive health recommendations were provided to patient.   Lavelle Charmaine Browner, LPN   88/87/7974   Return in 1 year (on 07/24/2025).  After Visit Summary: (Mail) Due to this being a telephonic visit, the after visit summary with patients personalized plan was offered to patient via mail   Nurse Notes: No concerns at this time

## 2024-07-24 NOTE — Patient Instructions (Addendum)
 [name],  Thank you for taking the time for your Medicare Wellness Visit. I appreciate your continued commitment to your health goals. Please review the care plan we discussed, and feel free to reach out if I can assist you further.  Please note that Annual Wellness Visits do not include a physical exam. Some assessments may be limited, especially if the visit was conducted virtually. If needed, we may recommend an in-person follow-up with your provider.  Ongoing Care Seeing your primary care provider every 3 to 6 months helps us  monitor your health and provide consistent, personalized care.   Referrals If a referral was made during today's visit and you haven't received any updates within two weeks, please contact the referred provider directly to check on the status.  You have an order for:  []   2D Mammogram  [x]   3D Mammogram  []   Bone Density     [facility] [address] [place], [place] [address] [phone]  Your appointment is 08/05/24 @ 12:40   Make sure to wear two-piece clothing.  No lotions, powders, or deodorants the day of the appointment. Make sure to bring picture ID and insurance card.  Bring list of medications you are currently taking including any supplements.   Recommended Screenings:  Health Maintenance  Topic Date Due   Pneumococcal Vaccine for age over 90 (3 of 3 - PCV20 or PCV21) 09/29/2022   COVID-19 Vaccine (2 - 2025-26 season) 05/13/2024   Cologuard (Stool DNA test)  01/27/2025   Medicare Annual Wellness Visit  07/24/2025   Pap with HPV screening  07/30/2025   Breast Cancer Screening  08/03/2025   DTaP/Tdap/Td vaccine (7 - Td or Tdap) 11/27/2032   Flu Shot  Completed   DEXA scan (bone density measurement)  Completed   Hepatitis C Screening  Completed   HIV Screening  Completed   Zoster (Shingles) Vaccine  Completed   Hepatitis B Vaccine  Aged Out   Meningitis B Vaccine  Aged Out   Colon Cancer Screening   Discontinued       07/24/2024    2:12 PM  Advanced Directives  Does Patient Have a Medical Advance Directive? No  Does patient want to make changes to medical advance directive? Yes (MAU/Ambulatory/Procedural Areas - Information given)   Information on Advanced Care Planning can be found at [place]  Secretary of [facility] Advance Health Care Directives Advance Health Care Directives ([email or web address]    Vision: Annual vision screenings are recommended for early detection of glaucoma, cataracts, and diabetic retinopathy. These exams can also reveal signs of chronic conditions such as diabetes and high blood pressure.  Dental: Annual dental screenings help detect early signs of oral cancer, gum disease, and other conditions linked to overall health, including heart disease and diabetes.  Please see the attached documents for additional preventive care recommendations.

## 2024-08-05 ENCOUNTER — Ambulatory Visit
Admission: RE | Admit: 2024-08-05 | Discharge: 2024-08-05 | Disposition: A | Discharge status: Home / Self Care | Source: Ambulatory Visit | Attending: Family Medicine | Admitting: Family Medicine

## 2024-08-05 DIAGNOSIS — Z1231 Encounter for screening mammogram for malignant neoplasm of breast: Secondary | ICD-10-CM

## 2024-08-22 ENCOUNTER — Other Ambulatory Visit (HOSPITAL_BASED_OUTPATIENT_CLINIC_OR_DEPARTMENT_OTHER)

## 2024-08-26 ENCOUNTER — Telehealth: Payer: Self-pay

## 2024-08-26 ENCOUNTER — Other Ambulatory Visit: Payer: Self-pay

## 2024-08-26 MED ORDER — LINACLOTIDE 145 MCG PO CAPS: 145.0000 ug | ORAL_CAPSULE | Freq: Every day | ORAL | 0 refills | Status: AC

## 2024-08-26 NOTE — Telephone Encounter (Signed)
 Prescription Request  08/26/2024  LOV: 01/05/24  What is the name of the medication or equipment? LINZESS  145 MCG CAPS capsule [[id number]]   Have you contacted your pharmacy to request a refill? Yes   Which pharmacy would you like this sent to?  [id number] - [place], [place] - [address] [place] [address] Phone: [phone] Fax: [phone]    Patient notified that their request is being sent to the clinical staff for review and that they should receive a response within 2 business days.   Please advise at [facility] [phone]

## 2024-08-26 NOTE — Telephone Encounter (Signed)
 Sent in medication

## 2024-09-02 ENCOUNTER — Other Ambulatory Visit: Payer: Self-pay

## 2024-09-02 NOTE — Telephone Encounter (Signed)
 Prescription Request  09/02/2024  LOV: 01/05/24  What is the name of the medication or equipment? famotidine  (PEPCID ) 20 MG tablet [[id number]]   Have you contacted your pharmacy to request a refill? Yes   Which pharmacy would you like this sent to?  [id number] - [place], [place] - [address] [place] [address] Phone: [phone] Fax: [phone]    Patient notified that their request is being sent to the clinical staff for review and that they should receive a response within 2 business days.   Please advise at [facility] [phone]

## 2024-09-04 NOTE — Telephone Encounter (Signed)
 Requested medication (s) are due for refill today - yes  Requested medication (s) are on the active medication list -yes  Future visit scheduled -no  Last refill: 02/26/24 #90 1RF  Notes to clinic: Patient reports taking increased dose daily- differing from prescribed- sent for PVP review   Requested Prescriptions  Pending Prescriptions Disp Refills   famotidine  (PEPCID ) 20 MG tablet 90 tablet 1     Gastroenterology:  H2 Antagonists Passed - 09/04/2024 12:29 PM      Passed - Valid encounter within last 12 months    Recent Outpatient Visits           8 months ago Postmenopausal estrogen deficiency   [place] [facility] Pickard, Butler DASEN, MD   9 months ago Globus sensation   [place] [facility] Duanne, Butler DASEN, MD   9 months ago Dysphagia, unspecified type   [place] [facility] Duanne, Butler DASEN, MD   11 months ago RUQ pain   [place] [facility] Duanne, Butler DASEN, MD   1 year ago Benign essential HTN   [place] [facility] Kayla Jeoffrey RAMAN, FNP                 Requested Prescriptions  Pending Prescriptions Disp Refills   famotidine  (PEPCID ) 20 MG tablet 90 tablet 1     Gastroenterology:  H2 Antagonists Passed - 09/04/2024 12:29 PM      Passed - Valid encounter within last 12 months    Recent Outpatient Visits           8 months ago Postmenopausal estrogen deficiency   [place] [facility] Pickard, Butler DASEN, MD   9 months ago Globus sensation   [place] [facility] Duanne, Butler DASEN, MD   9 months ago Dysphagia, unspecified type   [place] [facility] Duanne Butler DASEN, MD   11 months ago RUQ pain   [place] [facility] Duanne, Butler DASEN, MD   1 year ago Benign essential HTN   [place] [facility] Kayla Jeoffrey RAMAN, FNP

## 2024-09-06 MED ORDER — FAMOTIDINE 20 MG PO TABS: 40.0000 mg | ORAL_TABLET | Freq: Every day | ORAL | 1 refills | Status: AC

## 2024-09-16 ENCOUNTER — Telehealth: Payer: Self-pay | Admitting: Family Medicine

## 2024-09-16 ENCOUNTER — Ambulatory Visit: Admitting: Family Medicine

## 2024-09-16 ENCOUNTER — Other Ambulatory Visit: Payer: Self-pay | Admitting: Family Medicine

## 2024-09-16 ENCOUNTER — Encounter: Payer: Self-pay | Admitting: Family Medicine

## 2024-09-16 ENCOUNTER — Other Ambulatory Visit: Payer: Self-pay

## 2024-09-16 VITALS — BP 156/98 | HR 66 | Temp 97.9°F | Ht 62.0 in | Wt 161.0 lb

## 2024-09-16 DIAGNOSIS — R09A2 Foreign body sensation, throat: Secondary | ICD-10-CM

## 2024-09-16 MED ORDER — FLUTICASONE-SALMETEROL 115-21 MCG/ACT IN AERO: 2.0000 | INHALATION_SPRAY | Freq: Two times a day (BID) | RESPIRATORY_TRACT | 12 refills | Status: DC

## 2024-09-16 MED ORDER — HYDROCHLOROTHIAZIDE 12.5 MG PO TABS: 12.5000 mg | ORAL_TABLET | Freq: Every day | ORAL | 1 refills | Status: AC

## 2024-09-16 NOTE — Telephone Encounter (Signed)
 Sent in medication

## 2024-09-16 NOTE — Telephone Encounter (Signed)
 Prescription Request  09/16/2024  LOV: 09/16/2024  What is the name of the medication or equipment?   hydrochlorothiazide  (HYDRODIURIL ) 12.5 MG tablet  **90 day script request**  Have you contacted your pharmacy to request a refill? Yes   Which pharmacy would you like this sent to?  [id number] [place], [place] - [address] [place] [address] Phone: [phone] Fax: [phone]    Patient notified that their request is being sent to the clinical staff for review and that they should receive a response within 2 business days.   Please advise pharmacist.
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
## 2024-09-17 NOTE — Telephone Encounter (Signed)
 Requested medications are due for refill today.  See note  Requested medications are on the active medications list.  yes  Last refill. 09/17/2023  Future visit scheduled.   yes  Notes to clinic.  Pharmacy comment: Alternative Requested:THE PRESCRIBED MEDICATION IS NOT COVERED BY INSURANCE. PLEASE CONSIDER CHANGING TO ONE OF THE SUGGESTED COVERED ALTERNATIVES.     Requested Prescriptions  Pending Prescriptions Disp Refills   SYMBICORT  80-4.5 MCG/ACT inhaler [Pharmacy Med Name: SYMBICORT  80-4.5 MCG INHALER] 1 each 0     Pulmonology:  Combination Products Passed - 09/17/2024  3:56 PM      Passed - Valid encounter within last 12 months    Recent Outpatient Visits           Yesterday Globus sensation   [place] [facility] Duanne Butler DASEN, MD   8 months ago Postmenopausal estrogen deficiency   [place] [facility], Butler DASEN, MD   9 months ago Globus sensation   [place] [facility] Duanne Butler DASEN, MD   9 months ago Dysphagia, unspecified type   [place] [facility] Duanne Butler DASEN, MD   1 year ago RUQ pain   [place] [facility] Pickard, Butler DASEN, MD

## 2024-09-19 ENCOUNTER — Telehealth: Payer: Self-pay

## 2024-09-19 ENCOUNTER — Other Ambulatory Visit (HOSPITAL_COMMUNITY): Payer: Self-pay

## 2024-09-19 NOTE — Telephone Encounter (Signed)
 Pharmacy Patient Advocate Encounter   Received notification from Physician's Office that prior authorization for Advair  HFA 115-21MCG/ACT aerosol  is required/requested.   Insurance verification completed.   The patient is insured through [employer].   Per test claim: PA required; PA started via CoverMyMeds. KEY [id number] . Waiting for clinical questions to populate.

## 2024-09-24 ENCOUNTER — Other Ambulatory Visit

## 2024-09-24 ENCOUNTER — Telehealth: Payer: Self-pay

## 2024-09-24 ENCOUNTER — Other Ambulatory Visit: Payer: Self-pay | Admitting: Family Medicine

## 2024-09-24 NOTE — Telephone Encounter (Signed)
 Copied from CRM [id number]. Topic: Clinical - Medication Question >> Sep 24, 2024  2:06 PM Amy B wrote: Reason for CRM: Patient states that the inhaler that was prescribed is not covered by insurance.  She requests alternative medication for her COPD.

## 2024-09-25 ENCOUNTER — Other Ambulatory Visit: Payer: Self-pay

## 2024-09-26 ENCOUNTER — Other Ambulatory Visit (HOSPITAL_COMMUNITY): Payer: Self-pay

## 2024-09-26 NOTE — Telephone Encounter (Signed)
 Pharmacy Patient Advocate Encounter  Received notification from [facility] Medicare that Prior Authorization for Advair  HFA 115-21MCG/ACT aerosol has been APPROVED from 09/19/2024 to 09/11/2025. Unable to obtain price due to refill too soon rejection, last fill date 09/25/2024 next available fill date 10/18/2024   PA #/Case ID/Reference #: [id number]

## 2024-09-28 ENCOUNTER — Other Ambulatory Visit: Payer: Self-pay | Admitting: Family Medicine

## 2024-09-30 NOTE — Telephone Encounter (Signed)
 Requested Prescriptions  Pending Prescriptions Disp Refills   pantoprazole  (PROTONIX ) 40 MG tablet [Pharmacy Med Name: PANTOPRAZOLE  SOD DR 40 MG TAB] 180 tablet 0    Sig: TAKE 2 TABLETS BY MOUTH EVERY MORNING     Gastroenterology: Proton Pump Inhibitors Passed - 09/30/2024  9:19 AM      Passed - Valid encounter within last 12 months    Recent Outpatient Visits           2 weeks ago Globus sensation   [place] [facility] Duanne Butler DASEN, MD   8 months ago Postmenopausal estrogen deficiency   [place] [facility] Pickard, Butler DASEN, MD   10 months ago Globus sensation   [place] [facility] Duanne Butler DASEN, MD   10 months ago Dysphagia, unspecified type   [place] [facility] Duanne Butler DASEN, MD   1 year ago RUQ pain   [place] [facility] Pickard, Butler DASEN, MD

## 2025-07-30 ENCOUNTER — Ambulatory Visit
